# Patient Record
Sex: Female | Born: 1935 | Race: White | Hispanic: No | Smoking: Never smoker
Health system: Southern US, Community
[De-identification: ages and names within clinical notes are randomized; demographics above are authoritative.]

## PROBLEM LIST (undated history)

## (undated) DIAGNOSIS — E559 Vitamin D deficiency, unspecified: Secondary | ICD-10-CM

## (undated) DIAGNOSIS — I2510 Atherosclerotic heart disease of native coronary artery without angina pectoris: Secondary | ICD-10-CM

## (undated) DIAGNOSIS — K5792 Diverticulitis of intestine, part unspecified, without perforation or abscess without bleeding: Secondary | ICD-10-CM

## (undated) DIAGNOSIS — I4891 Unspecified atrial fibrillation: Secondary | ICD-10-CM

## (undated) DIAGNOSIS — K219 Gastro-esophageal reflux disease without esophagitis: Secondary | ICD-10-CM

## (undated) DIAGNOSIS — I519 Heart disease, unspecified: Secondary | ICD-10-CM

## (undated) DIAGNOSIS — E785 Hyperlipidemia, unspecified: Secondary | ICD-10-CM

## (undated) DIAGNOSIS — E039 Hypothyroidism, unspecified: Secondary | ICD-10-CM

## (undated) DIAGNOSIS — I10 Essential (primary) hypertension: Secondary | ICD-10-CM

## (undated) HISTORY — DX: Vitamin D deficiency, unspecified: E55.9

## (undated) HISTORY — PX: WRIST FRACTURE SURGERY: SHX121

## (undated) HISTORY — PX: ANKLE FRACTURE SURGERY: SHX122

## (undated) HISTORY — PX: FEMUR FRACTURE SURGERY: SHX633

## (undated) HISTORY — DX: Heart disease, unspecified: I51.9

## (undated) HISTORY — DX: Unspecified atrial fibrillation: I48.91

## (undated) HISTORY — PX: CORONARY STENT PLACEMENT: SHX1402

## (undated) HISTORY — PX: CHOLECYSTECTOMY: SHX55

---

## 2020-11-16 ENCOUNTER — Observation Stay (HOSPITAL_COMMUNITY)
Admission: EM | Admit: 2020-11-16 | Discharge: 2020-11-17 | Disposition: A | Discharge status: Home / Self Care | Payer: Medicare HMO | Attending: Internal Medicine | Admitting: Internal Medicine

## 2020-11-16 ENCOUNTER — Encounter (HOSPITAL_COMMUNITY): Payer: Self-pay | Admitting: Emergency Medicine

## 2020-11-16 ENCOUNTER — Emergency Department (HOSPITAL_COMMUNITY): Payer: Medicare HMO

## 2020-11-16 ENCOUNTER — Other Ambulatory Visit: Payer: Self-pay

## 2020-11-16 DIAGNOSIS — Z20822 Contact with and (suspected) exposure to covid-19: Secondary | ICD-10-CM | POA: Insufficient documentation

## 2020-11-16 DIAGNOSIS — R197 Diarrhea, unspecified: Secondary | ICD-10-CM

## 2020-11-16 DIAGNOSIS — K529 Noninfective gastroenteritis and colitis, unspecified: Secondary | ICD-10-CM | POA: Diagnosis not present

## 2020-11-16 DIAGNOSIS — I2510 Atherosclerotic heart disease of native coronary artery without angina pectoris: Secondary | ICD-10-CM | POA: Diagnosis not present

## 2020-11-16 DIAGNOSIS — I10 Essential (primary) hypertension: Secondary | ICD-10-CM | POA: Insufficient documentation

## 2020-11-16 DIAGNOSIS — R112 Nausea with vomiting, unspecified: Secondary | ICD-10-CM | POA: Diagnosis present

## 2020-11-16 DIAGNOSIS — E039 Hypothyroidism, unspecified: Secondary | ICD-10-CM | POA: Insufficient documentation

## 2020-11-16 DIAGNOSIS — I4891 Unspecified atrial fibrillation: Secondary | ICD-10-CM | POA: Diagnosis not present

## 2020-11-16 DIAGNOSIS — I4819 Other persistent atrial fibrillation: Secondary | ICD-10-CM | POA: Diagnosis present

## 2020-11-16 DIAGNOSIS — I482 Chronic atrial fibrillation, unspecified: Secondary | ICD-10-CM | POA: Diagnosis not present

## 2020-11-16 HISTORY — DX: Atherosclerotic heart disease of native coronary artery without angina pectoris: I25.10

## 2020-11-16 HISTORY — DX: Gastro-esophageal reflux disease without esophagitis: K21.9

## 2020-11-16 HISTORY — DX: Diverticulitis of intestine, part unspecified, without perforation or abscess without bleeding: K57.92

## 2020-11-16 HISTORY — DX: Hypothyroidism, unspecified: E03.9

## 2020-11-16 HISTORY — DX: Essential (primary) hypertension: I10

## 2020-11-16 HISTORY — DX: Hyperlipidemia, unspecified: E78.5

## 2020-11-16 LAB — COMPREHENSIVE METABOLIC PANEL
ALT: 15 U/L (ref 0–44)
AST: 28 U/L (ref 15–41)
Albumin: 3.9 g/dL (ref 3.5–5.0)
Alkaline Phosphatase: 84 U/L (ref 38–126)
Anion gap: 12 (ref 5–15)
BUN: 12 mg/dL (ref 8–23)
CO2: 25 mmol/L (ref 22–32)
Calcium: 9.7 mg/dL (ref 8.9–10.3)
Chloride: 106 mmol/L (ref 98–111)
Creatinine, Ser: 0.64 mg/dL (ref 0.44–1.00)
GFR, Estimated: >60 mL/min (ref >60)
Glucose, Bld: 128 mg/dL — ABNORMAL HIGH (ref 70–99)
Potassium: 4.2 mmol/L (ref 3.5–5.1)
Sodium: 143 mmol/L (ref 135–145)
Total Bilirubin: 1.6 mg/dL — ABNORMAL HIGH (ref 0.3–1.2)
Total Protein: 7 g/dL (ref 6.5–8.1)

## 2020-11-16 LAB — URINALYSIS, ROUTINE W REFLEX MICROSCOPIC
Bacteria, UA: NONE SEEN
Bilirubin Urine: NEGATIVE
Glucose, UA: NEGATIVE mg/dL
Hgb urine dipstick: NEGATIVE
Ketones, ur: 5 mg/dL — AB
Nitrite: NEGATIVE
Protein, ur: NEGATIVE mg/dL
Specific Gravity, Urine: 1.025 (ref 1.005–1.030)
pH: 9 — ABNORMAL HIGH (ref 5.0–8.0)

## 2020-11-16 LAB — CBC WITH DIFFERENTIAL/PLATELET
Abs Immature Granulocytes: 0.02 10*3/uL (ref 0.00–0.07)
Basophils Absolute: 0 10*3/uL (ref 0.0–0.1)
Basophils Relative: 0 %
Eosinophils Absolute: 0 10*3/uL (ref 0.0–0.5)
Eosinophils Relative: 0 %
HCT: 41.2 % (ref 36.0–46.0)
Hemoglobin: 14 g/dL (ref 12.0–15.0)
Immature Granulocytes: 0 %
Lymphocytes Relative: 9 %
Lymphs Abs: 0.8 10*3/uL (ref 0.7–4.0)
MCH: 33.6 pg (ref 26.0–34.0)
MCHC: 34 g/dL (ref 30.0–36.0)
MCV: 98.8 fL (ref 80.0–100.0)
Monocytes Absolute: 0.4 10*3/uL (ref 0.1–1.0)
Monocytes Relative: 5 %
Neutro Abs: 7.7 10*3/uL (ref 1.7–7.7)
Neutrophils Relative %: 86 %
Platelets: 196 10*3/uL (ref 150–400)
RBC: 4.17 MIL/uL (ref 3.87–5.11)
RDW: 13.1 % (ref 11.5–15.5)
WBC: 9 10*3/uL (ref 4.0–10.5)
nRBC: 0 % (ref 0.0–0.2)

## 2020-11-16 LAB — RESP PANEL BY RT-PCR (FLU A&B, COVID) ARPGX2
Influenza A by PCR: NEGATIVE
Influenza B by PCR: NEGATIVE
SARS Coronavirus 2 by RT PCR: NEGATIVE

## 2020-11-16 LAB — LIPASE, BLOOD: Lipase: 33 U/L (ref 11–51)

## 2020-11-16 MED ORDER — ONDANSETRON HCL 4 MG/2ML IJ SOLN
4.0000 mg | Freq: Once | INTRAMUSCULAR | Status: AC
  Administered 2020-11-16: 4 mg via INTRAVENOUS
  Filled 2020-11-16: qty 2

## 2020-11-16 MED ORDER — SODIUM CHLORIDE 0.9 % IV BOLUS
500.0000 mL | Freq: Once | INTRAVENOUS | Status: AC
  Administered 2020-11-16: 500 mL via INTRAVENOUS

## 2020-11-16 MED ORDER — ACETAMINOPHEN 650 MG RE SUPP: 650.0000 mg | Freq: Four times a day (QID) | RECTAL | Status: DC | PRN

## 2020-11-16 MED ORDER — SODIUM CHLORIDE 0.9 % IV SOLN: INTRAVENOUS | Status: DC

## 2020-11-16 MED ORDER — ONDANSETRON HCL 4 MG PO TABS: 4.0000 mg | ORAL_TABLET | Freq: Four times a day (QID) | ORAL | Status: DC | PRN

## 2020-11-16 MED ORDER — FENTANYL CITRATE PF 50 MCG/ML IJ SOSY
25.0000 ug | PREFILLED_SYRINGE | Freq: Once | INTRAMUSCULAR | Status: AC
  Administered 2020-11-16: 25 ug via INTRAVENOUS
  Filled 2020-11-16: qty 1

## 2020-11-16 MED ORDER — ACETAMINOPHEN 325 MG PO TABS
650.0000 mg | ORAL_TABLET | Freq: Four times a day (QID) | ORAL | Status: DC | PRN
  Administered 2020-11-17: 650 mg via ORAL
  Filled 2020-11-16: qty 2

## 2020-11-16 MED ORDER — LACTATED RINGERS IV SOLN: INTRAVENOUS | Status: DC

## 2020-11-16 MED ORDER — IOHEXOL 350 MG/ML SOLN
80.0000 mL | Freq: Once | INTRAVENOUS | Status: AC | PRN
  Administered 2020-11-16: 80 mL via INTRAVENOUS

## 2020-11-16 MED ORDER — ONDANSETRON HCL 4 MG/2ML IJ SOLN: 4.0000 mg | Freq: Four times a day (QID) | INTRAMUSCULAR | Status: DC | PRN

## 2020-11-16 NOTE — ED Provider Notes (Signed)
[facility]-EMERGENCY DEPT Provider Note   CSN: [id number] Arrival date & time: 11/16/20  1752     History Chief Complaint  Patient presents with   Nausea   Emesis   Diarrhea    Charlotte Powell is a 87 y.o. female.  HPI Patient presents for evaluation of nausea, vomiting and diarrhea all of which started today.  She is living in a nursing care facility.  Patient denies blood in emesis or stool.  She does not feel like she has had a fever.  She complains of pain in her right lower abdomen, her upper back and her neck.  She has chronic neck pain for which she uses "a cream."  Her daughter is here and helps to give history.  No other recent illnesses.  No chronic problems with gastrointestinal disorders.  There are no other known active modifying factors    History reviewed. No pertinent past medical history.  There are no problems to display for this patient.    OB History   No obstetric history on file.     No family history on file.     Home Medications Prior to Admission medications   Not on File    Allergies    Patient has no allergy information on record.  Review of Systems   Review of Systems  All other systems reviewed and are negative.  Physical Exam Updated Vital Signs BP (!) 127/105   Pulse (!) 113   Temp 97.7 F (36.5 C) (Oral)   Resp 18   Ht 5\' 3"  (1.6 m)   Wt 52.6 kg   SpO2 92%   BMI 20.55 kg/m   Physical Exam Vitals and nursing note reviewed.  Constitutional:      General: She is not in acute distress.    Appearance: She is well-developed. She is not ill-appearing, toxic-appearing or diaphoretic.     Comments: Elderly, frail  HENT:     Head: Normocephalic and atraumatic.  Eyes:     Conjunctiva/sclera: Conjunctivae normal.     Pupils: Pupils are equal, round, and reactive to light.  Neck:     Trachea: Phonation normal.  Cardiovascular:     Rate and Rhythm: Normal rate and regular rhythm.  Pulmonary:      Effort: Pulmonary effort is normal.     Breath sounds: Normal breath sounds.  Chest:     Chest wall: No tenderness.  Abdominal:     General: There is no distension.     Palpations: Abdomen is soft.     Tenderness: There is abdominal tenderness (Right lower quadrant, mild). There is no guarding.  Musculoskeletal:        General: No swelling or tenderness. Normal range of motion.     Cervical back: Normal range of motion and neck supple.  Skin:    General: Skin is warm and dry.     Coloration: Skin is not jaundiced or pale.  Neurological:     Mental Status: She is alert and oriented to person, place, and time.     Motor: No abnormal muscle tone.  Psychiatric:        Mood and Affect: Mood normal.        Behavior: Behavior normal.        Thought Content: Thought content normal.        Judgment: Judgment normal.    ED Results / Procedures / Treatments   Labs (all labs ordered are listed, but only abnormal results are displayed)  Labs Reviewed  COMPREHENSIVE METABOLIC PANEL - Abnormal; Notable for the following components:      Result Value   Glucose, Bld 128 (*)    Total Bilirubin 1.6 (*)    All other components within normal limits  URINALYSIS, ROUTINE W REFLEX MICROSCOPIC - Abnormal; Notable for the following components:   APPearance CLOUDY (*)    pH 9.0 (*)    Ketones, ur 5 (*)    Leukocytes,Ua TRACE (*)    All other components within normal limits  RESP PANEL BY RT-PCR (FLU A&B, COVID) ARPGX2  LIPASE, BLOOD  CBC WITH DIFFERENTIAL/PLATELET    EKG None  Radiology CT Abdomen Pelvis W Contrast  Result Date: 11/16/2020 CLINICAL DATA:  Nausea vomiting and diarrhea EXAM: CT ABDOMEN AND PELVIS WITH CONTRAST TECHNIQUE: Multidetector CT imaging of the abdomen and pelvis was performed using the standard protocol following bolus administration of intravenous contrast. CONTRAST:  80mL OMNIPAQUE IOHEXOL 350 MG/ML SOLN COMPARISON:  None. FINDINGS: LOWER CHEST: Normal. HEPATOBILIARY:  Normal hepatic contours. No intra- or extrahepatic biliary dilatation. Status post cholecystectomy. PANCREAS: Normal pancreas. No ductal dilatation or peripancreatic fluid collection. SPLEEN: Normal. ADRENALS/URINARY TRACT: The adrenal glands are normal. No hydronephrosis, nephroureterolithiasis or solid renal mass. The urinary bladder is normal for degree of distention STOMACH/BOWEL: There is no hiatal hernia. Normal duodenal course and caliber. No small bowel dilatation or inflammation. No focal colonic abnormality. Normal appendix. VASCULAR/LYMPHATIC: There is calcific atherosclerosis of the abdominal aorta. No lymphadenopathy. REPRODUCTIVE: Normal uterus. No adnexal mass. MUSCULOSKELETAL. Multilevel degenerative disc disease and facet arthrosis. No bony spinal canal stenosis. OTHER: None. IMPRESSION: 1. No acute abnormality of the abdomen or pelvis. Aortic Atherosclerosis (ICD10-I70.0). Electronically Signed   By: Deatra Robinson M.D.   On: 11/16/2020 21:24    Procedures Procedures   Medications Ordered in ED Medications  0.9 %  sodium chloride infusion ( Intravenous New Bag/Given 11/16/20 2037)  fentaNYL (SUBLIMAZE) injection 25 mcg (25 mcg Intravenous Given 11/16/20 1928)  ondansetron (ZOFRAN) injection 4 mg (4 mg Intravenous Given 11/16/20 1927)  sodium chloride 0.9 % bolus 500 mL (500 mLs Intravenous New Bag/Given 11/16/20 1932)  iohexol (OMNIPAQUE) 350 MG/ML injection 80 mL (80 mLs Intravenous Contrast Given 11/16/20 2104)    ED Course  I have reviewed the triage vital signs and the nursing notes.  Pertinent labs & imaging results that were available during my care of the patient were reviewed by me and considered in my medical decision making (see chart for details).    MDM Rules/Calculators/A&P                            Patient Vitals for the past 24 hrs:  BP Temp Temp src Pulse Resp SpO2 Height Weight  11/16/20 2230 (!) 127/105 -- -- (!) 113 18 92 % -- --  11/16/20 2138 (!) 151/65  -- -- 95 18 99 % -- --  11/16/20 2030 (!) 147/67 -- -- (!) 102 18 91 % -- --  11/16/20 2015 -- -- -- 96 -- 95 % -- --  11/16/20 2000 (!) 145/68 -- -- 92 18 93 % -- --  11/16/20 1945 -- -- -- 86 -- 91 % -- --  11/16/20 1930 (!) 150/64 -- -- 85 -- 100 % -- --  11/16/20 1915 -- -- -- 90 -- 100 % -- --  11/16/20 1900 (!) 150/82 -- -- 86 18 100 % -- --  11/16/20 1845 -- -- -- 89 --  94 % -- --  11/16/20 1830 134/84 -- -- 92 -- 100 % -- --  11/16/20 1815 -- -- -- 78 -- 100 % -- --  11/16/20 1806 -- -- -- -- -- -- 5\' 3"  (1.6 m) 52.6 kg  11/16/20 1801 126/89 97.7 F (36.5 C) Oral 81 18 100 % -- --  11/16/20 1800 126/89 -- -- 73 -- 100 % -- --    10:47 PM Reevaluation with update and discussion. After initial assessment and treatment, an updated evaluation reveals she states that she is feeling better.  At this time her abdominal discomfort is somewhat less but still mildly tender, in the mid abdomen.  She has been able to eat 2 crackers, and drink about 4 ounces of fluid without vomiting.  Patient states she does not feel like she can go home because she is too weak.  Patient's daughter is with her and agrees with that assessment.  We will contact the hospitalist for admission. 11/18/20   Medical Decision Making:  This patient is presenting for evaluation of nausea, vomiting and diarrhea, which does require a range of treatment options, and is a complaint that involves a moderate risk of morbidity and mortality. The differential diagnoses include enteritis, colitis, food poisoning. I decided to review old records, and in summary elderly female lives in a nursing care facility presenting with nausea vomiting diarrhea.  She is generally healthy for her age.  I did not require additional historical information from anyone.  Clinical Laboratory Tests Ordered, included CBC, Metabolic panel, and lipase, viral panel, urinalysis . Review indicates normal except urine with trace leukocytes, glucose  high. Radiologic Tests Ordered, included CT abdomen pelvis.  I independently Visualized: Radiographic images, which show no acute abnormalities   Critical Interventions-clinical evaluation, laboratory testing, medication treatment, IV fluids, observation and reassessment  After These Interventions, the Patient was reevaluated and was found with persistent discomfort after initial treatment.  Laboratory and CT imaging is reassuring.  Patient does not feel well enough to go home.  She is an elderly debilitated patient, lives in a nursing care facility.  No clear cause for vomiting and diarrhea.  Hospitalist contacted to admit patient for observation.  CRITICAL CARE-no Performed by: Mancel Bale  Nursing Notes Reviewed/ Care Coordinated Applicable Imaging Reviewed Interpretation of Laboratory Data incorporated into ED treatment  10:50 PM-Consult complete with hospitalist. Patient case explained and discussed.  He agrees to admit patient for further evaluation and treatment. Call ended at 11:12 PM    Final Clinical Impression(s) / ED Diagnoses Final diagnoses:  Nausea vomiting and diarrhea    Rx / DC Orders ED Discharge Orders     None        Mancel Bale, MD 11/16/20 2316

## 2020-11-16 NOTE — ED Triage Notes (Addendum)
Pt BIBA.  Coming from [facility] with c/o sudden acute onset of n/v/d that started at about 5 pm this afternoon. Hx of A-fib and enterocolitis d/t c-diff.   T- 102 temporal 300 ml NS 4 mg Zofran 22 G right upper arm CBG 139 BP- 138/76 Hr - 80

## 2020-11-16 NOTE — H&P (Addendum)
History and Physical    Charlotte Powell NTI:[id number] DOB: Mar 18, 1928 DOA: 11/16/2020  PCP: System, Provider Not In  Patient coming from: ALF  I have personally briefly reviewed patient's old medical records in [facility]  Chief Complaint: N/V/D  HPI: Charlotte Powell is a 87 y.o. female with medical history significant of HTN, HLD, CAD s/p stent.  Pt presents to ED with c/o sudden onset N/V/D all onset today.  Started with diarrhea which then rapidly resolved but pt developed severe N/V.  RLQ abd pain, upper back and neck pain.  Neck pain is chronic.    No fever, chills.   ED Course: Symptoms improving rapidly.  Now actually tolerating soda PO, just feels weak.  Lab work is unimpressive, as is her CT A/P.  EDP requests overnight obs.   Review of Systems: As per HPI, otherwise all review of systems negative.  Past Medical History:  Diagnosis Date   CAD (coronary artery disease)    Diverticulitis    GERD (gastroesophageal reflux disease)    HLD (hyperlipidemia)    HTN (hypertension)    Hypothyroidism     Past Surgical History:  Procedure Laterality Date   ANKLE FRACTURE SURGERY     CHOLECYSTECTOMY     CORONARY STENT PLACEMENT     FEMUR FRACTURE SURGERY     WRIST FRACTURE SURGERY       reports that she has never smoked. She has never used smokeless tobacco. She reports that she does not drink alcohol and does not use drugs.  Allergies  Allergen Reactions   Amoxicillin    Hydrocodone     Confusion and AMS    Family History  Problem Relation Age of Onset   Stomach cancer Neg Hx      Prior to Admission medications   Not on File    Physical Exam: Vitals:   11/16/20 2015 11/16/20 2030 11/16/20 2138 11/16/20 2230  BP:  (!) 147/67 (!) 151/65 (!) 127/105  Pulse: 96 (!) 102 95 (!) 113  Resp:  18 18 18   Temp:      TempSrc:      SpO2: 95% 91% 99% 92%  Weight:      Height:        Constitutional: NAD, calm, comfortable Eyes: PERRL, lids and  conjunctivae normal ENMT: Mucous membranes are moist. Posterior pharynx clear of any exudate or lesions.Normal dentition.  Neck: normal, supple, no masses, no thyromegaly Respiratory: clear to auscultation bilaterally, no wheezing, no crackles. Normal respiratory effort. No accessory muscle use.  Cardiovascular: Regular rate and rhythm, no murmurs / rubs / gallops. No extremity edema. 2+ pedal pulses. No carotid bruits.  Abdomen: Mild RLQ TTP Musculoskeletal: no clubbing / cyanosis. No joint deformity upper and lower extremities. Good ROM, no contractures. Normal muscle tone.  Skin: no rashes, lesions, ulcers. No induration Neurologic: CN 2-12 grossly intact. Sensation intact, DTR normal. Strength 5/5 in all 4.  Psychiatric: Normal judgment and insight. Alert and oriented x 3. Normal mood.    Labs on Admission: I have personally reviewed following labs and imaging studies  CBC: Recent Labs  Lab 11/16/20 1931  WBC 9.0  NEUTROABS 7.7  HGB 14.0  HCT 41.2  MCV 98.8  PLT 196   Basic Metabolic Panel: Recent Labs  Lab 11/16/20 1931  NA 143  K 4.2  CL 106  CO2 25  GLUCOSE 128*  BUN 12  CREATININE 0.64  CALCIUM 9.7   GFR: Estimated Creatinine Clearance: 38.7 mL/min (by C-G  formula based on SCr of 0.64 mg/dL). Liver Function Tests: Recent Labs  Lab 11/16/20 1931  AST 28  ALT 15  ALKPHOS 84  BILITOT 1.6*  PROT 7.0  ALBUMIN 3.9   Recent Labs  Lab 11/16/20 1931  LIPASE 33   No results for input(s): AMMONIA in the last 168 hours. Coagulation Profile: No results for input(s): INR, PROTIME in the last 168 hours. Cardiac Enzymes: No results for input(s): CKTOTAL, CKMB, CKMBINDEX, TROPONINI in the last 168 hours. BNP (last 3 results) No results for input(s): PROBNP in the last 8760 hours. HbA1C: No results for input(s): HGBA1C in the last 72 hours. CBG: No results for input(s): GLUCAP in the last 168 hours. Lipid Profile: No results for input(s): CHOL, HDL, LDLCALC,  TRIG, CHOLHDL, LDLDIRECT in the last 72 hours. Thyroid Function Tests: No results for input(s): TSH, T4TOTAL, FREET4, T3FREE, THYROIDAB in the last 72 hours. Anemia Panel: No results for input(s): VITAMINB12, FOLATE, FERRITIN, TIBC, IRON, RETICCTPCT in the last 72 hours. Urine analysis:    Component Value Date/Time   COLORURINE YELLOW 11/16/2020 2129   APPEARANCEUR CLOUDY (A) 11/16/2020 2129   LABSPEC 1.025 11/16/2020 2129   PHURINE 9.0 (H) 11/16/2020 2129   GLUCOSEU NEGATIVE 11/16/2020 2129   HGBUR NEGATIVE 11/16/2020 2129   BILIRUBINUR NEGATIVE 11/16/2020 2129   KETONESUR 5 (A) 11/16/2020 2129   PROTEINUR NEGATIVE 11/16/2020 2129   NITRITE NEGATIVE 11/16/2020 2129   LEUKOCYTESUR TRACE (A) 11/16/2020 2129    Radiological Exams on Admission: CT Abdomen Pelvis W Contrast  Result Date: 11/16/2020 CLINICAL DATA:  Nausea vomiting and diarrhea EXAM: CT ABDOMEN AND PELVIS WITH CONTRAST TECHNIQUE: Multidetector CT imaging of the abdomen and pelvis was performed using the standard protocol following bolus administration of intravenous contrast. CONTRAST:  23mL OMNIPAQUE IOHEXOL 350 MG/ML SOLN COMPARISON:  None. FINDINGS: LOWER CHEST: Normal. HEPATOBILIARY: Normal hepatic contours. No intra- or extrahepatic biliary dilatation. Status post cholecystectomy. PANCREAS: Normal pancreas. No ductal dilatation or peripancreatic fluid collection. SPLEEN: Normal. ADRENALS/URINARY TRACT: The adrenal glands are normal. No hydronephrosis, nephroureterolithiasis or solid renal mass. The urinary bladder is normal for degree of distention STOMACH/BOWEL: There is no hiatal hernia. Normal duodenal course and caliber. No small bowel dilatation or inflammation. No focal colonic abnormality. Normal appendix. VASCULAR/LYMPHATIC: There is calcific atherosclerosis of the abdominal aorta. No lymphadenopathy. REPRODUCTIVE: Normal uterus. No adnexal mass. MUSCULOSKELETAL. Multilevel degenerative disc disease and facet  arthrosis. No bony spinal canal stenosis. OTHER: None. IMPRESSION: 1. No acute abnormality of the abdomen or pelvis. Aortic Atherosclerosis (ICD10-I70.0). Electronically Signed   By: Deatra Robinson M.D.   On: 11/16/2020 21:24    EKG: Independently reviewed.  Assessment/Plan Principal Problem:   Nausea & vomiting Active Problems:   Gastroenteritis   A-fib (HCC)    N/V - Suspect gastroenteritis, maybe food poisoning? Tolerating liquids PO at this time Clear liquid diet Overnight OBS IVF: LR at 100 for tonight Repeat CBC/BMP in AM Given improvement in symptoms already as well as neg CT findings: Holding off on empiric ABx for the moment Will also hold off on GI pathogen pnl, etc, unless symptoms dont continue to improve overnight Zofran PRN A.Fib -  Cont eliquis Doesn't appear to be on any rate/rhythm control meds Reportedly went in to S.[place] in ED Tele monitor  DVT prophylaxis: Eliquis Code Status: DNR Family Communication: Daughter at bedside Disposition Plan: back to ALF, hopefully tomorrow if she continues to improve Consults called: None Admission status: Place in [place],  Khayri Kargbo M. DO [facility]  How to contact the [facility] Attending or Consulting provider 7A - 7P or covering provider during after hours 7P -7A, for this patient?  Check the care team in [facility] and look for a) attending/consulting [facility] provider listed and b) the [facility] team listed Log into [email or web address]  Am[employer] Physician Scheduling and messaging for groups and whole hospitals  On call and physician scheduling software for group practices, residents, hospitalists and other medical providers for call, clinic, rotation and shift schedules. On[employer] is a hospital-wide system for scheduling doctors and paging doctors on call. EasyPlot is for scientific plotting and data analysis.  [email or web address]  and use [place]'s universal password to access. If you do not have the password, please contact  the hospital operator.  Locate the [facility] provider you are looking for under [facility] and page to a number that you can be directly reached. If you still have difficulty reaching the provider, please page the [facility] (Director on Call) for the Hospitalists listed on amion for assistance.  11/16/2020, 11:52 PM

## 2020-11-17 DIAGNOSIS — K529 Noninfective gastroenteritis and colitis, unspecified: Secondary | ICD-10-CM | POA: Diagnosis not present

## 2020-11-17 DIAGNOSIS — R112 Nausea with vomiting, unspecified: Secondary | ICD-10-CM | POA: Diagnosis not present

## 2020-11-17 LAB — COMPREHENSIVE METABOLIC PANEL
ALT: 14 U/L (ref 0–44)
AST: 22 U/L (ref 15–41)
Albumin: 3.4 g/dL — ABNORMAL LOW (ref 3.5–5.0)
Alkaline Phosphatase: 68 U/L (ref 38–126)
Anion gap: 6 (ref 5–15)
BUN: 8 mg/dL (ref 8–23)
CO2: 26 mmol/L (ref 22–32)
Calcium: 8.9 mg/dL (ref 8.9–10.3)
Chloride: 106 mmol/L (ref 98–111)
Creatinine, Ser: 0.57 mg/dL (ref 0.44–1.00)
GFR, Estimated: >60 mL/min (ref >60)
Glucose, Bld: 88 mg/dL (ref 70–99)
Potassium: 3.5 mmol/L (ref 3.5–5.1)
Sodium: 138 mmol/L (ref 135–145)
Total Bilirubin: 1.7 mg/dL — ABNORMAL HIGH (ref 0.3–1.2)
Total Protein: 6.1 g/dL — ABNORMAL LOW (ref 6.5–8.1)

## 2020-11-17 LAB — CBC
HCT: 37.6 % (ref 36.0–46.0)
Hemoglobin: 12.6 g/dL (ref 12.0–15.0)
MCH: 34.1 pg — ABNORMAL HIGH (ref 26.0–34.0)
MCHC: 33.5 g/dL (ref 30.0–36.0)
MCV: 101.9 fL — ABNORMAL HIGH (ref 80.0–100.0)
Platelets: 160 10*3/uL (ref 150–400)
RBC: 3.69 MIL/uL — ABNORMAL LOW (ref 3.87–5.11)
RDW: 13.4 % (ref 11.5–15.5)
WBC: 5.8 10*3/uL (ref 4.0–10.5)
nRBC: 0 % (ref 0.0–0.2)

## 2020-11-17 MED ORDER — GABAPENTIN 100 MG PO CAPS
100.0000 mg | ORAL_CAPSULE | Freq: Every day | ORAL | Status: DC
  Administered 2020-11-17: 100 mg via ORAL
  Filled 2020-11-17: qty 1

## 2020-11-17 MED ORDER — ACETAMINOPHEN 500 MG PO TABS: 1000.0000 mg | ORAL_TABLET | Freq: Every day | ORAL | Status: DC

## 2020-11-17 MED ORDER — FLUTICASONE PROPIONATE 50 MCG/ACT NA SUSP
1.0000 | Freq: Two times a day (BID) | NASAL | Status: DC
  Administered 2020-11-17: 1 via NASAL
  Filled 2020-11-17: qty 16

## 2020-11-17 MED ORDER — DOCUSATE SODIUM 100 MG PO CAPS: 100.0000 mg | ORAL_CAPSULE | Freq: Every day | ORAL | Status: DC

## 2020-11-17 MED ORDER — PRAVASTATIN SODIUM 20 MG PO TABS
80.0000 mg | ORAL_TABLET | Freq: Every day | ORAL | Status: DC
  Administered 2020-11-17: 80 mg via ORAL
  Filled 2020-11-17: qty 4

## 2020-11-17 MED ORDER — FOLIC ACID 1 MG PO TABS: 1.0000 mg | ORAL_TABLET | Freq: Every day | ORAL | Status: DC

## 2020-11-17 MED ORDER — APIXABAN 5 MG PO TABS
5.0000 mg | ORAL_TABLET | Freq: Two times a day (BID) | ORAL | Status: DC
  Administered 2020-11-17: 5 mg via ORAL
  Filled 2020-11-17 (×2): qty 1

## 2020-11-17 MED ORDER — LEVOTHYROXINE SODIUM 25 MCG PO TABS
25.0000 ug | ORAL_TABLET | Freq: Every day | ORAL | Status: DC
  Administered 2020-11-17: 25 ug via ORAL
  Filled 2020-11-17: qty 1

## 2020-11-17 MED ORDER — METOPROLOL TARTRATE 5 MG/5ML IV SOLN: 5.0000 mg | INTRAVENOUS | Status: DC | PRN

## 2020-11-17 MED ORDER — VITAMIN D3 25 MCG (1000 UNIT) PO TABS
1000.0000 [IU] | ORAL_TABLET | Freq: Every day | ORAL | Status: DC
  Filled 2020-11-17: qty 1

## 2020-11-17 MED ORDER — DICLOFENAC SODIUM 1 % EX GEL
4.0000 g | Freq: Two times a day (BID) | CUTANEOUS | Status: DC
  Filled 2020-11-17: qty 100

## 2020-11-17 MED ORDER — FAMOTIDINE 20 MG PO TABS: 20.0000 mg | ORAL_TABLET | Freq: Every day | ORAL | Status: DC

## 2020-11-17 NOTE — Discharge Summary (Signed)
Physician Discharge Summary   Patient name: Charlotte Powell  Admit date:     11/16/2020  Discharge date: 11/17/2020  Attending Physician: Hillary Bow [[id number]]  Discharge Physician: Lewie Chamber   PCP: System, Provider Not In    Recommendations at discharge: Continue routine outpatient care  Discharge Diagnoses Principal Problem:   Nausea & vomiting Active Problems:   Gastroenteritis   A-fib [facility])   Resolved Diagnoses Resolved Problems:   * No resolved hospital problems. [facility] Course    Charlotte Powell is a 87 yo female with PMH HTN, HLD, CAD who presented with N/V and some diarrhea. She lives in assisted living.  There were no other known sick contacts prior to hospitalization.  She had right lower quadrant abdominal pain as well as complaints of back pain on admission.  She has known chronic neck pain. She underwent CT abdomen/pelvis on admission which was negative for acute abnormalities to explain her pain. Lab work-up was also reassuring with no significant abnormalities.  She was started on fluids and monitored overnight.  She had improvement in her symptoms and was able to tolerate a diet the morning following admission and was considered stable for discharging.    Condition at discharge: stable  Exam General appearance: alert, cooperative, and no distress Head: Normocephalic, without obvious abnormality, atraumatic Eyes:  EOMI Lungs: clear to auscultation bilaterally Heart: regular rate and rhythm and S1, S2 normal Abdomen:  soft, NT, ND, BS present Extremities:  no edema Skin: mobility and turgor normal Neurologic: Grossly normal   Disposition: Home  Discharge time: greater than 30 minutes. Allergies as of 11/17/2020       Reactions   Amoxicillin    Hydrocodone    Confusion and AMS        Medication List     TAKE these medications    acetaminophen 500 MG tablet Commonly known as: TYLENOL Take 1,000 mg by mouth at bedtime.    acetaminophen 325 MG tablet Commonly known as: TYLENOL Take 650 mg by mouth every 8 (eight) hours as needed (for pain).   Cholecalciferol 25 MCG (1000 UT) capsule Take 1,000 Units by mouth daily.   diclofenac Sodium 1 % Gel Commonly known as: VOLTAREN Apply 1 application to right side of neck topically two times a day for pain   docusate sodium 100 MG capsule Commonly known as: COLACE Take 100 mg by mouth daily.   Eliquis 5 MG Tabs tablet Generic drug: apixaban Take 5 mg by mouth 2 (two) times daily.   famotidine 20 MG tablet Commonly known as: PEPCID Take 20 mg by mouth daily.   fluticasone 50 MCG/ACT nasal spray Commonly known as: FLONASE Place 1 spray into both nostrils every 12 (twelve) hours.   folic acid 800 MCG tablet Commonly known as: FOLVITE Take 800 mcg by mouth daily.   gabapentin 100 MG capsule Commonly known as: NEURONTIN Take 100 mg by mouth at bedtime.   levothyroxine 25 MCG tablet Commonly known as: SYNTHROID Take 25 mcg by mouth daily before breakfast.   nitroGLYCERIN 0.4 MG SL tablet Commonly known as: NITROSTAT Place 0.4 mg under the tongue every 5 (five) minutes as needed for chest pain.   pravastatin 80 MG tablet Commonly known as: PRAVACHOL Take 80 mg by mouth at bedtime.        CT Abdomen Pelvis W Contrast  Result Date: 11/16/2020 CLINICAL DATA:  Nausea vomiting and diarrhea EXAM: CT ABDOMEN AND PELVIS WITH CONTRAST TECHNIQUE: Multidetector CT imaging  of the abdomen and pelvis was performed using the standard protocol following bolus administration of intravenous contrast. CONTRAST:  64mL OMNIPAQUE IOHEXOL 350 MG/ML SOLN COMPARISON:  None. FINDINGS: LOWER CHEST: Normal. HEPATOBILIARY: Normal hepatic contours. No intra- or extrahepatic biliary dilatation. Status post cholecystectomy. PANCREAS: Normal pancreas. No ductal dilatation or peripancreatic fluid collection. SPLEEN: Normal. ADRENALS/URINARY TRACT: The adrenal glands are normal. No  hydronephrosis, nephroureterolithiasis or solid renal mass. The urinary bladder is normal for degree of distention STOMACH/BOWEL: There is no hiatal hernia. Normal duodenal course and caliber. No small bowel dilatation or inflammation. No focal colonic abnormality. Normal appendix. VASCULAR/LYMPHATIC: There is calcific atherosclerosis of the abdominal aorta. No lymphadenopathy. REPRODUCTIVE: Normal uterus. No adnexal mass. MUSCULOSKELETAL. Multilevel degenerative disc disease and facet arthrosis. No bony spinal canal stenosis. OTHER: None. IMPRESSION: 1. No acute abnormality of the abdomen or pelvis. Aortic Atherosclerosis (ICD10-I70.0). Electronically Signed   By: Deatra Robinson M.D.   On: 11/16/2020 21:24   Results for orders placed or performed during the hospital encounter of 11/16/20  Resp Panel by RT-PCR (Flu A&B, Covid) Nasopharyngeal Swab     Status: None   Collection Time: 11/16/20  7:34 PM   Specimen: Nasopharyngeal Swab; Nasopharyngeal(NP) swabs in vial transport medium  Result Value Ref Range Status   SARS Coronavirus 2 by RT PCR NEGATIVE NEGATIVE Final    Comment: (NOTE) SARS-CoV-2 target nucleic acids are NOT DETECTED.  The SARS-CoV-2 RNA is generally detectable in upper respiratory specimens during the acute phase of infection. The lowest concentration of SARS-CoV-2 viral copies this assay can detect is 138 copies/mL. A negative result does not preclude SARS-Cov-2 infection and should not be used as the sole basis for treatment or other patient management decisions. A negative result may occur with  improper specimen collection/handling, submission of specimen other than nasopharyngeal swab, presence of viral mutation(s) within the areas targeted by this assay, and inadequate number of viral copies(<138 copies/mL). A negative result must be combined with clinical observations, patient history, and epidemiological information. The expected result is Negative.  Fact Sheet for  Patients:  BloggerCourse.com  Fact Sheet for Healthcare Providers:  SeriousBroker.it  This test is no t yet approved or cleared by the [place] FDA and  has been authorized for detection and/or diagnosis of SARS-CoV-2 by FDA under an Emergency Use Authorization (EUA). This EUA will remain  in effect (meaning this test can be used) for the duration of the COVID-19 declaration under Section 564(b)(1) of the Act, 21 U.S.C.section 360bbb-3(b)(1), unless the authorization is terminated  or revoked sooner.       Influenza A by PCR NEGATIVE NEGATIVE Final   Influenza B by PCR NEGATIVE NEGATIVE Final    Comment: (NOTE) The Xpert Xpress SARS-CoV-2/FLU/RSV plus assay is intended as an aid in the diagnosis of influenza from Nasopharyngeal swab specimens and should not be used as a sole basis for treatment. Nasal washings and aspirates are unacceptable for Xpert Xpress SARS-CoV-2/FLU/RSV testing.  Fact Sheet for Patients: BloggerCourse.com  Fact Sheet for Healthcare Providers: SeriousBroker.it  This test is not yet approved or cleared by the [place] FDA and has been authorized for detection and/or diagnosis of SARS-CoV-2 by FDA under an Emergency Use Authorization (EUA). This EUA will remain in effect (meaning this test can be used) for the duration of the COVID-19 declaration under Section 564(b)(1) of the Act, 21 U.S.C. section 360bbb-3(b)(1), unless the authorization is terminated or revoked.  Performed at [facility], [address] [place],  [place] [address]     Signed:  Lewie Chamber MD.  [facility] 11/17/2020, 1:41 PM

## 2020-11-17 NOTE — ED Notes (Signed)
Patient given coffee, and a sandwich to see how they tolerate it.

## 2020-11-17 NOTE — ED Notes (Signed)
Tolerated food and drink well. Message sent to MD.

## 2020-11-17 NOTE — ED Notes (Signed)
Girguis, MD at bedside to discuss plan with patient. Girguis was informed that patient wanted to be discharged.

## 2020-11-27 ENCOUNTER — Emergency Department (HOSPITAL_COMMUNITY): Payer: Medicare HMO

## 2020-11-27 ENCOUNTER — Encounter (HOSPITAL_COMMUNITY): Payer: Self-pay

## 2020-11-27 ENCOUNTER — Inpatient Hospital Stay (HOSPITAL_COMMUNITY)
Admission: EM | Admit: 2020-11-27 | Discharge: 2020-11-30 | DRG: 177 | Disposition: A | Discharge status: Home Health | Payer: Medicare HMO | Source: Skilled Nursing Facility | Attending: Internal Medicine | Admitting: Internal Medicine

## 2020-11-27 ENCOUNTER — Other Ambulatory Visit: Payer: Self-pay

## 2020-11-27 DIAGNOSIS — Z955 Presence of coronary angioplasty implant and graft: Secondary | ICD-10-CM

## 2020-11-27 DIAGNOSIS — Z79890 Hormone replacement therapy (postmenopausal): Secondary | ICD-10-CM

## 2020-11-27 DIAGNOSIS — G9340 Encephalopathy, unspecified: Secondary | ICD-10-CM

## 2020-11-27 DIAGNOSIS — J189 Pneumonia, unspecified organism: Secondary | ICD-10-CM | POA: Diagnosis present

## 2020-11-27 DIAGNOSIS — J690 Pneumonitis due to inhalation of food and vomit: Principal | ICD-10-CM | POA: Diagnosis present

## 2020-11-27 DIAGNOSIS — E785 Hyperlipidemia, unspecified: Secondary | ICD-10-CM | POA: Diagnosis present

## 2020-11-27 DIAGNOSIS — Z880 Allergy status to penicillin: Secondary | ICD-10-CM

## 2020-11-27 DIAGNOSIS — Z20822 Contact with and (suspected) exposure to covid-19: Secondary | ICD-10-CM | POA: Diagnosis present

## 2020-11-27 DIAGNOSIS — G9341 Metabolic encephalopathy: Secondary | ICD-10-CM | POA: Diagnosis present

## 2020-11-27 DIAGNOSIS — I2510 Atherosclerotic heart disease of native coronary artery without angina pectoris: Secondary | ICD-10-CM | POA: Diagnosis present

## 2020-11-27 DIAGNOSIS — I482 Chronic atrial fibrillation, unspecified: Secondary | ICD-10-CM | POA: Diagnosis not present

## 2020-11-27 DIAGNOSIS — K219 Gastro-esophageal reflux disease without esophagitis: Secondary | ICD-10-CM | POA: Diagnosis present

## 2020-11-27 DIAGNOSIS — Z66 Do not resuscitate: Secondary | ICD-10-CM | POA: Diagnosis present

## 2020-11-27 DIAGNOSIS — E039 Hypothyroidism, unspecified: Secondary | ICD-10-CM | POA: Diagnosis present

## 2020-11-27 DIAGNOSIS — Z7901 Long term (current) use of anticoagulants: Secondary | ICD-10-CM

## 2020-11-27 DIAGNOSIS — I480 Paroxysmal atrial fibrillation: Secondary | ICD-10-CM | POA: Diagnosis present

## 2020-11-27 DIAGNOSIS — Z885 Allergy status to narcotic agent status: Secondary | ICD-10-CM

## 2020-11-27 DIAGNOSIS — R54 Age-related physical debility: Secondary | ICD-10-CM | POA: Diagnosis present

## 2020-11-27 DIAGNOSIS — I10 Essential (primary) hypertension: Secondary | ICD-10-CM | POA: Diagnosis present

## 2020-11-27 DIAGNOSIS — Z79899 Other long term (current) drug therapy: Secondary | ICD-10-CM

## 2020-11-27 DIAGNOSIS — R4701 Aphasia: Secondary | ICD-10-CM | POA: Diagnosis present

## 2020-11-27 LAB — I-STAT VENOUS BLOOD GAS, ED
Acid-Base Excess: 2 mmol/L (ref 0.0–2.0)
Bicarbonate: 26.6 mmol/L (ref 20.0–28.0)
Calcium, Ion: 1.18 mmol/L (ref 1.15–1.40)
HCT: 40 % (ref 36.0–46.0)
Hemoglobin: 13.6 g/dL (ref 12.0–15.0)
O2 Saturation: 84 %
Potassium: 3.6 mmol/L (ref 3.5–5.1)
Sodium: 136 mmol/L (ref 135–145)
TCO2: 28 mmol/L (ref 22–32)
pCO2, Ven: 41.3 mmHg — ABNORMAL LOW (ref 44.0–60.0)
pH, Ven: 7.417 (ref 7.250–7.430)
pO2, Ven: 48 mmHg — ABNORMAL HIGH (ref 32.0–45.0)

## 2020-11-27 LAB — RESP PANEL BY RT-PCR (FLU A&B, COVID) ARPGX2
Influenza A by PCR: NEGATIVE
Influenza B by PCR: NEGATIVE
SARS Coronavirus 2 by RT PCR: NEGATIVE

## 2020-11-27 LAB — RAPID URINE DRUG SCREEN, HOSP PERFORMED
Amphetamines: NOT DETECTED
Barbiturates: NOT DETECTED
Benzodiazepines: NOT DETECTED
Cocaine: NOT DETECTED
Opiates: NOT DETECTED
Tetrahydrocannabinol: NOT DETECTED

## 2020-11-27 LAB — COMPREHENSIVE METABOLIC PANEL
ALT: 9 U/L (ref 0–44)
AST: 26 U/L (ref 15–41)
Albumin: 3.7 g/dL (ref 3.5–5.0)
Alkaline Phosphatase: 85 U/L (ref 38–126)
Anion gap: 10 (ref 5–15)
BUN: 8 mg/dL (ref 8–23)
CO2: 22 mmol/L (ref 22–32)
Calcium: 9.7 mg/dL (ref 8.9–10.3)
Chloride: 104 mmol/L (ref 98–111)
Creatinine, Ser: 0.76 mg/dL (ref 0.44–1.00)
GFR, Estimated: >60 mL/min (ref >60)
Glucose, Bld: 99 mg/dL (ref 70–99)
Potassium: 4.7 mmol/L (ref 3.5–5.1)
Sodium: 136 mmol/L (ref 135–145)
Total Bilirubin: 1.7 mg/dL — ABNORMAL HIGH (ref 0.3–1.2)
Total Protein: 6.4 g/dL — ABNORMAL LOW (ref 6.5–8.1)

## 2020-11-27 LAB — URINALYSIS, ROUTINE W REFLEX MICROSCOPIC
Bilirubin Urine: NEGATIVE
Glucose, UA: NEGATIVE mg/dL
Hgb urine dipstick: NEGATIVE
Ketones, ur: NEGATIVE mg/dL
Leukocytes,Ua: NEGATIVE
Nitrite: NEGATIVE
Protein, ur: NEGATIVE mg/dL
Specific Gravity, Urine: 1.018 (ref 1.005–1.030)
pH: 8 (ref 5.0–8.0)

## 2020-11-27 LAB — DIFFERENTIAL
Abs Immature Granulocytes: 0.02 10*3/uL (ref 0.00–0.07)
Basophils Absolute: 0 10*3/uL (ref 0.0–0.1)
Basophils Relative: 1 %
Eosinophils Absolute: 0.3 10*3/uL (ref 0.0–0.5)
Eosinophils Relative: 4 %
Immature Granulocytes: 0 %
Lymphocytes Relative: 58 %
Lymphs Abs: 4.8 10*3/uL — ABNORMAL HIGH (ref 0.7–4.0)
Monocytes Absolute: 0.6 10*3/uL (ref 0.1–1.0)
Monocytes Relative: 7 %
Neutro Abs: 2.4 10*3/uL (ref 1.7–7.7)
Neutrophils Relative %: 30 %

## 2020-11-27 LAB — LACTIC ACID, PLASMA
Lactic Acid, Venous: 2.2 mmol/L (ref 0.5–1.9)
Lactic Acid, Venous: 0.9 mmol/L (ref 0.5–1.9)

## 2020-11-27 LAB — I-STAT CHEM 8, ED
BUN: 8 mg/dL (ref 8–23)
Calcium, Ion: 1.07 mmol/L — ABNORMAL LOW (ref 1.15–1.40)
Chloride: 105 mmol/L (ref 98–111)
Creatinine, Ser: 0.5 mg/dL (ref 0.44–1.00)
Glucose, Bld: 98 mg/dL (ref 70–99)
HCT: 41 % (ref 36.0–46.0)
Hemoglobin: 13.9 g/dL (ref 12.0–15.0)
Potassium: 4.7 mmol/L (ref 3.5–5.1)
Sodium: 137 mmol/L (ref 135–145)
TCO2: 24 mmol/L (ref 22–32)

## 2020-11-27 LAB — CBC
HCT: 43.7 % (ref 36.0–46.0)
Hemoglobin: 14.6 g/dL (ref 12.0–15.0)
MCH: 34.2 pg — ABNORMAL HIGH (ref 26.0–34.0)
MCHC: 33.4 g/dL (ref 30.0–36.0)
MCV: 102.3 fL — ABNORMAL HIGH (ref 80.0–100.0)
Platelets: 181 10*3/uL (ref 150–400)
RBC: 4.27 MIL/uL (ref 3.87–5.11)
RDW: 12.9 % (ref 11.5–15.5)
WBC: 8.2 10*3/uL (ref 4.0–10.5)
nRBC: 0 % (ref 0.0–0.2)

## 2020-11-27 LAB — APTT: aPTT: 35 seconds (ref 24–36)

## 2020-11-27 LAB — TSH: TSH: 4.119 u[IU]/mL (ref 0.350–4.500)

## 2020-11-27 LAB — STREP PNEUMONIAE URINARY ANTIGEN: Strep Pneumo Urinary Antigen: NEGATIVE

## 2020-11-27 LAB — PROTIME-INR
INR: 1.2 (ref 0.8–1.2)
Prothrombin Time: 14.7 seconds (ref 11.4–15.2)

## 2020-11-27 LAB — AMMONIA: Ammonia: 26 umol/L (ref 9–35)

## 2020-11-27 MED ORDER — ACETAMINOPHEN 500 MG PO TABS
1000.0000 mg | ORAL_TABLET | Freq: Every day | ORAL | Status: DC
  Administered 2020-11-27 – 2020-11-29 (×3): 1000 mg via ORAL
  Filled 2020-11-27 (×3): qty 2

## 2020-11-27 MED ORDER — SODIUM CHLORIDE 0.9% FLUSH
10.0000 mL | Freq: Two times a day (BID) | INTRAVENOUS | Status: DC
  Administered 2020-11-28 – 2020-11-29 (×4): 10 mL

## 2020-11-27 MED ORDER — FOLIC ACID 1 MG PO TABS
1000.0000 ug | ORAL_TABLET | Freq: Every day | ORAL | Status: DC
  Administered 2020-11-28 – 2020-11-30 (×3): 1 mg via ORAL
  Filled 2020-11-27 (×4): qty 1

## 2020-11-27 MED ORDER — LACTATED RINGERS IV BOLUS
1000.0000 mL | Freq: Once | INTRAVENOUS | Status: AC
  Administered 2020-11-27: 1000 mL via INTRAVENOUS

## 2020-11-27 MED ORDER — APIXABAN 2.5 MG PO TABS
2.5000 mg | ORAL_TABLET | Freq: Two times a day (BID) | ORAL | Status: DC
  Administered 2020-11-27 – 2020-11-30 (×6): 2.5 mg via ORAL
  Filled 2020-11-27 (×6): qty 1

## 2020-11-27 MED ORDER — ACETAMINOPHEN 325 MG PO TABS: 650.0000 mg | ORAL_TABLET | Freq: Three times a day (TID) | ORAL | Status: DC | PRN

## 2020-11-27 MED ORDER — VITAMIN D 25 MCG (1000 UNIT) PO TABS
1000.0000 [IU] | ORAL_TABLET | Freq: Every day | ORAL | Status: DC
  Administered 2020-11-28 – 2020-11-30 (×3): 1000 [IU] via ORAL
  Filled 2020-11-27 (×4): qty 1

## 2020-11-27 MED ORDER — HALOPERIDOL LACTATE 5 MG/ML IJ SOLN: 1.0000 mg | Freq: Once | INTRAMUSCULAR | Status: DC

## 2020-11-27 MED ORDER — LACTATED RINGERS IV BOLUS
500.0000 mL | Freq: Once | INTRAVENOUS | Status: AC
  Administered 2020-11-27: 500 mL via INTRAVENOUS

## 2020-11-27 MED ORDER — PRAVASTATIN SODIUM 40 MG PO TABS
80.0000 mg | ORAL_TABLET | Freq: Every day | ORAL | Status: DC
  Administered 2020-11-28 – 2020-11-30 (×3): 80 mg via ORAL
  Filled 2020-11-27 (×4): qty 2

## 2020-11-27 MED ORDER — SODIUM CHLORIDE 0.9 % IV SOLN
2.0000 g | INTRAVENOUS | Status: DC
Start: 2020-11-27 — End: 2020-11-30
  Administered 2020-11-28 – 2020-11-29 (×2): 2 g via INTRAVENOUS
  Filled 2020-11-27 (×3): qty 20

## 2020-11-27 MED ORDER — FLUTICASONE PROPIONATE 50 MCG/ACT NA SUSP
1.0000 | Freq: Two times a day (BID) | NASAL | Status: DC | PRN
  Filled 2020-11-27: qty 16

## 2020-11-27 MED ORDER — SODIUM CHLORIDE 0.9 % IV SOLN
500.0000 mg | Freq: Once | INTRAVENOUS | Status: AC
  Administered 2020-11-27: 500 mg via INTRAVENOUS
  Filled 2020-11-27: qty 500

## 2020-11-27 MED ORDER — GABAPENTIN 100 MG PO CAPS
100.0000 mg | ORAL_CAPSULE | Freq: Every day | ORAL | Status: DC
  Administered 2020-11-27 – 2020-11-29 (×3): 100 mg via ORAL
  Filled 2020-11-27 (×3): qty 1

## 2020-11-27 MED ORDER — LORAZEPAM 2 MG/ML IJ SOLN
INTRAMUSCULAR | Status: AC
  Administered 2020-11-27: 1 mg
  Filled 2020-11-27: qty 1

## 2020-11-27 MED ORDER — SODIUM CHLORIDE 0.9% FLUSH: 10.0000 mL | INTRAVENOUS | Status: DC | PRN

## 2020-11-27 MED ORDER — NITROGLYCERIN 0.4 MG SL SUBL: 0.4000 mg | SUBLINGUAL_TABLET | SUBLINGUAL | Status: DC | PRN

## 2020-11-27 MED ORDER — FAMOTIDINE 20 MG PO TABS
20.0000 mg | ORAL_TABLET | Freq: Every day | ORAL | Status: DC
  Administered 2020-11-28 – 2020-11-30 (×3): 20 mg via ORAL
  Filled 2020-11-27 (×4): qty 1

## 2020-11-27 MED ORDER — SODIUM CHLORIDE 0.9% FLUSH: 3.0000 mL | Freq: Once | INTRAVENOUS | Status: DC

## 2020-11-27 MED ORDER — LEVOTHYROXINE SODIUM 25 MCG PO TABS
25.0000 ug | ORAL_TABLET | Freq: Every day | ORAL | Status: DC
  Administered 2020-11-28 – 2020-11-30 (×3): 25 ug via ORAL
  Filled 2020-11-27 (×3): qty 1

## 2020-11-27 MED ORDER — DOCUSATE SODIUM 100 MG PO CAPS
100.0000 mg | ORAL_CAPSULE | ORAL | Status: DC
  Administered 2020-11-29: 100 mg via ORAL
  Filled 2020-11-27 (×2): qty 1

## 2020-11-27 MED ORDER — SODIUM CHLORIDE 0.9 % IV SOLN
2.0000 g | Freq: Once | INTRAVENOUS | Status: AC
  Administered 2020-11-27: 2 g via INTRAVENOUS
  Filled 2020-11-27: qty 20

## 2020-11-27 MED ORDER — IOHEXOL 350 MG/ML SOLN
50.0000 mL | Freq: Once | INTRAVENOUS | Status: AC | PRN
  Administered 2020-11-27: 50 mL via INTRAVENOUS

## 2020-11-27 MED ORDER — HALOPERIDOL LACTATE 5 MG/ML IJ SOLN
2.0000 mg | Freq: Once | INTRAMUSCULAR | Status: AC
  Administered 2020-11-27: 2 mg via INTRAVENOUS
  Filled 2020-11-27: qty 1

## 2020-11-27 MED ORDER — SODIUM CHLORIDE 0.9 % IV SOLN
500.0000 mg | INTRAVENOUS | Status: DC
  Administered 2020-11-28: 500 mg via INTRAVENOUS
  Filled 2020-11-27 (×2): qty 500

## 2020-11-27 NOTE — ED Provider Notes (Addendum)
[facility] EMERGENCY DEPARTMENT Provider Note   CSN: [id number] Arrival date & time: 11/27/20  1028  An emergency department physician performed an initial assessment on this suspected stroke patient at 1029.  History Chief Complaint  Patient presents with   Code Stroke    Charlotte Powell is a 87 y.o. female with PMH A. fib on Eliquis, GERD, HTN, coronary artery disease who presents the emergency department for evaluation of aphasia and disorientation.  Patient arrives as a stroke alert with last known well at 7:30 AM, with staff noting symptoms at approximately 9:30 AM.  Additional review of systems unable to be obtained as the patient is currently encephalopathic.  HPI     Past Medical History:  Diagnosis Date   CAD (coronary artery disease)    Diverticulitis    GERD (gastroesophageal reflux disease)    HLD (hyperlipidemia)    HTN (hypertension)    Hypothyroidism     Patient Active Problem List   Diagnosis Date Noted   Gastroenteritis 11/16/2020   Nausea & vomiting 11/16/2020   A-fib (HCC) 11/16/2020    Past Surgical History:  Procedure Laterality Date   ANKLE FRACTURE SURGERY     CHOLECYSTECTOMY     CORONARY STENT PLACEMENT     FEMUR FRACTURE SURGERY     WRIST FRACTURE SURGERY       OB History   No obstetric history on file.     Family History  Problem Relation Age of Onset   Stomach cancer Neg Hx     Social History   Tobacco Use   Smoking status: Never   Smokeless tobacco: Never  Substance Use Topics   Alcohol use: Never   Drug use: Never    Home Medications Prior to Admission medications   Medication Sig Start Date End Date Taking? Authorizing Provider  acetaminophen (TYLENOL) 325 MG tablet Take 650 mg by mouth every 8 (eight) hours as needed (for pain).   Yes [provider]  acetaminophen (TYLENOL) 500 MG tablet Take 1,000 mg by mouth at bedtime.   Yes [provider]  Cholecalciferol 25 MCG (1000 UT)  capsule Take 1,000 Units by mouth daily.   Yes [provider]  diclofenac Sodium (VOLTAREN) 1 % GEL Apply 4 g topically in the morning and at bedtime. Apply to right side of neck 10/23/20  Yes [provider]  docusate sodium (COLACE) 100 MG capsule Take 100 mg by mouth every other day.   Yes [provider]  ELIQUIS 5 MG TABS tablet Take 5 mg by mouth 2 (two) times daily. 10/26/20  Yes [provider]  famotidine (PEPCID) 20 MG tablet Take 20 mg by mouth daily. 11/01/20  Yes [provider]  fluticasone (FLONASE) 50 MCG/ACT nasal spray Place 1 spray into both nostrils 2 (two) times daily as needed for allergies. 08/07/20  Yes [provider]  folic acid (FOLVITE) 800 MCG tablet Take 800 mcg by mouth daily.   Yes [provider]  gabapentin (NEURONTIN) 100 MG capsule Take 100 mg by mouth at bedtime.   Yes [provider]  levothyroxine (SYNTHROID) 25 MCG tablet Take 25 mcg by mouth daily before breakfast.   Yes [provider]  nitroGLYCERIN (NITROSTAT) 0.4 MG SL tablet Place 0.4 mg under the tongue every 5 (five) minutes as needed for chest pain. 08/07/20  Yes [provider]  pravastatin (PRAVACHOL) 80 MG tablet Take 80 mg by mouth at bedtime. 10/25/20  Yes [provider]  Allergies    Amoxicillin and Hydrocodone  Review of Systems   Review of Systems  Unable to perform ROS: Mental status change   Physical Exam Updated Vital Signs BP (!) 176/102   Pulse 95   Temp (!) 97.2 F (36.2 C) (Oral)   Resp (!) 30   Ht 5\' 3"  (1.6 m)   Wt 52.6 kg   SpO2 98%   BMI 20.55 kg/m   Physical Exam Vitals and nursing note reviewed.  Constitutional:      General: She is not in acute distress.    Appearance: She is well-developed.  HENT:     Head: Normocephalic and atraumatic.  Eyes:     Conjunctiva/sclera: Conjunctivae normal.  Cardiovascular:     Rate and Rhythm: Normal rate and regular rhythm.      Heart sounds: No murmur heard. Pulmonary:     Effort: Pulmonary effort is normal. No respiratory distress.     Breath sounds: Normal breath sounds.  Abdominal:     Palpations: Abdomen is soft.     Tenderness: There is no abdominal tenderness.  Musculoskeletal:     Cervical back: Neck supple.  Skin:    General: Skin is warm and dry.  Neurological:     Mental Status: She is alert. She is disoriented.    ED Results / Procedures / Treatments   Labs (all labs ordered are listed, but only abnormal results are displayed) Labs Reviewed  CBC - Abnormal; Notable for the following components:      Result Value   MCV 102.3 (*)    MCH 34.2 (*)    All other components within normal limits  DIFFERENTIAL - Abnormal; Notable for the following components:   Lymphs Abs 4.8 (*)    All other components within normal limits  COMPREHENSIVE METABOLIC PANEL - Abnormal; Notable for the following components:   Total Protein 6.4 (*)    Total Bilirubin 1.7 (*)    All other components within normal limits  LACTIC ACID, PLASMA - Abnormal; Notable for the following components:   Lactic Acid, Venous 2.2 (*)    All other components within normal limits  I-STAT CHEM 8, ED - Abnormal; Notable for the following components:   Calcium, Ion 1.07 (*)    All other components within normal limits  PROTIME-INR  APTT  URINALYSIS, ROUTINE W REFLEX MICROSCOPIC  TSH  AMMONIA  LACTIC ACID, PLASMA  CBG MONITORING, ED    EKG None  Radiology CT HEAD CODE STROKE WO CONTRAST  Result Date: 11/27/2020 CLINICAL DATA:  Code stroke.  Neuro deficit, acute, stroke suspected EXAM: CT HEAD WITHOUT CONTRAST TECHNIQUE: Contiguous axial images were obtained from the base of the skull through the vertex without intravenous contrast. COMPARISON:  None. FINDINGS: Brain: There is no acute intracranial hemorrhage, mass effect, or edema. Gray-white differentiation is preserved. There is no extra-axial fluid collection. Patchy and  confluent areas of hypoattenuation in the supratentorial white matter are nonspecific but probably reflect moderate chronic microvascular ischemic changes. Prominence of the ventricles and sulci reflects parenchymal volume loss. Vascular: No hyperdense vessel.There is atherosclerotic calcification at the skull base. Skull: Calvarium is unremarkable. Sinuses/Orbits: No acute finding. Other: None. ASPECTS ([place] Stroke Program Early CT Score) - Ganglionic level infarction (caudate, lentiform nuclei, internal capsule, insula, M1-M3 cortex): 7 - Supraganglionic infarction (M4-M6 cortex): 3 Total score (0-10 with 10 being normal): 10 IMPRESSION: There is no acute intracranial hemorrhage or evidence of acute infarction. ASPECT score is 10. Moderate chronic microvascular ischemic  changes. Superimposed acute small vessel infarct is difficult to exclude. These results were communicated to Dr. Thomasena Edis at 10:42 am on 11/27/2020 by text page via the [facility] messaging system. Electronically Signed   By: Guadlupe Spanish M.D.   On: 11/27/2020 10:44   CT ANGIO HEAD NECK W WO CM (CODE STROKE)  Result Date: 11/27/2020 CLINICAL DATA:  Stroke, follow up EXAM: CT ANGIOGRAPHY HEAD AND NECK TECHNIQUE: Multidetector CT imaging of the head and neck was performed using the standard protocol during bolus administration of intravenous contrast. Multiplanar CT image reconstructions and MIPs were obtained to evaluate the vascular anatomy. Carotid stenosis measurements (when applicable) are obtained utilizing NASCET criteria, using the distal internal carotid diameter as the denominator. CONTRAST:  78mL OMNIPAQUE IOHEXOL 350 MG/ML SOLN COMPARISON:  None. FINDINGS: CTA NECK Aortic arch: Mixed plaque along the arch. Great vessel origins are patent. No high-grade stenosis of the proximal subclavian arteries. Right carotid system: Patent. Mild primarily calcified plaque along the proximal internal carotid with minimal stenosis. Left carotid  system: Patent. Mild primarily calcified plaque along the proximal internal carotid with minimal stenosis. Vertebral arteries: Patent. Calcified plaque at the left vertebral origin without high-grade stenosis. Skeleton: Degenerative changes of the cervical spine. Other neck: Unremarkable. Upper chest: No apical lung mass. Review of the MIP images confirms the above findings CTA HEAD Anterior circulation: Intracranial internal carotid arteries are patent with calcified plaque but no significant stenosis. Anterior and middle cerebral arteries are patent. Anterior communicating artery is present. Posterior circulation: Intracranial vertebral arteries are patent. Basilar artery is patent. Major cerebellar artery origins are patent. Posterior cerebral arteries are patent. Basilar artery terminates as the right PCA. The left PCA has a fetal origin. Venous sinuses: Patent as allowed by contrast bolus timing. Review of the MIP images confirms the above findings IMPRESSION: No large vessel occlusion, hemodynamically significant stenosis, or evidence of dissection. These results were communicated to Dr. Thomasena Edis At 10:57 am on 11/27/2020 by text page via the [facility] messaging system. Electronically Signed   By: Guadlupe Spanish M.D.   On: 11/27/2020 11:07    Procedures Procedures   Medications Ordered in ED Medications  sodium chloride flush (NS) 0.9 % injection 3 mL (3 mLs Intravenous Not Given 11/27/20 1251)  LORazepam (ATIVAN) 2 MG/ML injection (1 mg  Given 11/27/20 1045)  iohexol (OMNIPAQUE) 350 MG/ML injection 50 mL (50 mLs Intravenous Contrast Given 11/27/20 1052)  haloperidol lactate (HALDOL) injection 2 mg (2 mg Intravenous Given 11/27/20 1136)    ED Course  I have reviewed the triage vital signs and the nursing notes.  Pertinent labs & imaging results that were available during my care of the patient were reviewed by me and considered in my medical decision making (see chart for details).  Clinical  Course as of 11/27/20 1405  Tue Nov 27, 2020  1401 BP(!): 167/83 [MK]    Clinical Course User Index [MK] Gurjot Brisco, Wyn Forster, MD   MDM Rules/Calculators/A&P                           Patient seen the emergency department as a stroke alert due to concern for aphasia and disorientation.  Physical exam is large unremarkable but does reveal a encephalopathic patient unable to follow commands but is protecting her airway without difficulty.  Initial vital signs reveal hypothermia to 96.5 and repeat temperature 97.2.  Patient mildly tachypneic but maintaining oxygen saturations at 100% on room air.  CT head  stroke unremarkable, CT angio unremarkable.  Neurology agrees that this is less likely stroke and more likely underlying encephalopathy metabolic versus infectious.  Laboratory evaluation largely unremarkable with no electrolyte abnormalities, urinalysis unremarkable, ammonia and TSH unremarkable, lactic elevated to 2.2.  Fluid resuscitation initiated at 30 cc/kg with 1.5 L of lactated Ringer's.  Chest x-ray with left lower opacity concerning for possible pneumonia.  Ceftriaxone azithromycin initiated. patient require admission for encephalopathy and concern for pneumonia. Final Clinical Impression(s) / ED Diagnoses Final diagnoses:  None    Rx / DC Orders ED Discharge Orders     None        Ripley Bogosian, MD 11/27/20 1405    Glendora Score, MD 11/27/20 1406

## 2020-11-27 NOTE — ED Notes (Signed)
Patient sleeping. Pt family member refused BP and temp and is requesting that meds be postponed.

## 2020-11-27 NOTE — H&P (Signed)
History and Physical    Charlotte Powell ELF:[id number] DOB: [birth year] DOA: 11/27/2020  PCP: System, Provider Not In  Patient coming from: Assisted living facility  I have personally briefly reviewed patient's old medical records available.   Chief Complaint: Altered mental status and aphasia  HPI: Charlotte Powell is a 87 y.o. female with medical history significant of paroxysmal A. fib and therapeutic on Eliquis, history of hypertension and hyperlipidemia, hypothyroidism who was recently admitted to hospital with nausea and vomiting and symptomatically treated brought back to the ER as code stroke as she was confused and aphasic in the morning.  When she was brought to the ER she was crying and moaning but unable to speak any meaningful words.  Patient is still encephalopathic but somehow more awake than earlier.  She cannot tell what is wrong with her.  She does not know she is in the hospital. ED Course: Patient awake but confused and agitated.  She was screaming.  No focal logical deficit.  Moving all extremities.  Stroke alert was called.  Seen by neurology.  Initial CT scan of the head and CT angiogram was essentially normal.  Urinalysis is normal.  Chest x-ray with possible left lower lobe pneumonia.  Lactic acid 2.2. COVID-19 and influenza swab pending. Acute stroke ruled out, admission requested for acute metabolic encephalopathy.  I was able to talk to patient's daughter at the bedside who tells me that 2 days ago she was at her baseline.  Patient does not have any issues on usual days.  She had episode of agitation and confusion in March 2022 when she had a left hip fracture and was in the hospital.  That subsequently improved.  No reported new medication use.  Daughter stated that she was on Benadryl in the past and never had any trouble.  Does not look like she is currently on any sleeping pills.  Review of Systems: all systems are reviewed and pertinent positive as per HPI otherwise  rest are negative.    Past Medical History:  Diagnosis Date   CAD (coronary artery disease)    Diverticulitis    GERD (gastroesophageal reflux disease)    HLD (hyperlipidemia)    HTN (hypertension)    Hypothyroidism     Past Surgical History:  Procedure Laterality Date   ANKLE FRACTURE SURGERY     CHOLECYSTECTOMY     CORONARY STENT PLACEMENT     FEMUR FRACTURE SURGERY     WRIST FRACTURE SURGERY      Social history   reports that she has never smoked. She has never used smokeless tobacco. She reports that she does not drink alcohol and does not use drugs.  Allergies  Allergen Reactions   Amoxicillin    Hydrocodone     Confusion and AMS    Family History  Problem Relation Age of Onset   Stomach cancer Neg Hx      Prior to Admission medications   Medication Sig Start Date End Date Taking? Authorizing Provider  acetaminophen (TYLENOL) 325 MG tablet Take 650 mg by mouth every 8 (eight) hours as needed (for pain).   Yes [provider]  acetaminophen (TYLENOL) 500 MG tablet Take 1,000 mg by mouth at bedtime.   Yes [provider]  Cholecalciferol 25 MCG (1000 UT) capsule Take 1,000 Units by mouth daily.   Yes [provider]  diclofenac Sodium (VOLTAREN) 1 % GEL Apply 4 g topically in the morning and at bedtime. Apply to right side of neck  10/23/20  Yes [provider]  docusate sodium (COLACE) 100 MG capsule Take 100 mg by mouth every other day.   Yes [provider]  ELIQUIS 5 MG TABS tablet Take 5 mg by mouth 2 (two) times daily. 10/26/20  Yes [provider]  famotidine (PEPCID) 20 MG tablet Take 20 mg by mouth daily. 11/01/20  Yes [provider]  fluticasone (FLONASE) 50 MCG/ACT nasal spray Place 1 spray into both nostrils 2 (two) times daily as needed for allergies. 08/07/20  Yes [provider]  folic acid (FOLVITE) 800 MCG tablet Take 800 mcg by mouth daily.   Yes [provider]  gabapentin  (NEURONTIN) 100 MG capsule Take 100 mg by mouth at bedtime.   Yes [provider]  levothyroxine (SYNTHROID) 25 MCG tablet Take 25 mcg by mouth daily before breakfast.   Yes [provider]  nitroGLYCERIN (NITROSTAT) 0.4 MG SL tablet Place 0.4 mg under the tongue every 5 (five) minutes as needed for chest pain. 08/07/20  Yes [provider]  pravastatin (PRAVACHOL) 80 MG tablet Take 80 mg by mouth at bedtime. 10/25/20  Yes [provider]    Physical Exam: Vitals:   11/27/20 1230 11/27/20 1245 11/27/20 1300 11/27/20 1330  BP: (!) 180/102 (!) 176/102 (!) 161/45 (!) 167/83  Pulse: 95 95 100 (!) 108  Resp: (!) 23 (!) 30 (!) 26 (!) 26  Temp:      TempSrc:      SpO2: 100% 98% 100% 98%  Weight:      Height:        Constitutional: Patient is on room air.  She looks alert and awake but confused and impulsive.  She is agitated somehow.  Able to follow simple commands but not keep up conversation. Vitals:   11/27/20 1230 11/27/20 1245 11/27/20 1300 11/27/20 1330  BP: (!) 180/102 (!) 176/102 (!) 161/45 (!) 167/83  Pulse: 95 95 100 (!) 108  Resp: (!) 23 (!) 30 (!) 26 (!) 26  Temp:      TempSrc:      SpO2: 100% 98% 100% 98%  Weight:      Height:       Eyes: PERRL, lids and conjunctivae normal ENMT: Mucous membranes are moist. Posterior pharynx clear of any exudate or lesions.Normal dentition.  Neck: normal, supple, no masses, no thyromegaly Respiratory: clear to auscultation bilaterally, no wheezing, no crackles. Normal respiratory effort. No accessory muscle use.  Cardiovascular: Regular rate and rhythm, no murmurs / rubs / gallops. No extremity edema. 2+ pedal pulses. No carotid bruits.  Abdomen: no tenderness, no masses palpated. No hepatosplenomegaly. Bowel sounds positive.  Musculoskeletal: no clubbing / cyanosis. No joint deformity upper and lower extremities. Good ROM, no contractures. Normal muscle tone.  Skin: no rashes, lesions, ulcers. No  induration Neurologic: Confused and impulsive.  Speech is clear.  Moves all extremities.  Sensation intact, DTR normal. Strength 5/5 in all 4.  Psychiatric: Impaired judgment and insight. Alert and oriented x 3.    Labs on Admission: I have personally reviewed following labs and imaging studies  CBC: Recent Labs  Lab 11/27/20 1033 11/27/20 1039  WBC 8.2  --   NEUTROABS 2.4  --   HGB 14.6 13.9  HCT 43.7 41.0  MCV 102.3*  --   PLT 181  --    Basic Metabolic Panel: Recent Labs  Lab 11/27/20 1033 11/27/20 1039  NA 136 137  K 4.7 4.7  CL 104 105  CO2  22  --   GLUCOSE 99 98  BUN 8 8  CREATININE 0.76 0.50  CALCIUM 9.7  --    GFR: Estimated Creatinine Clearance: 38.7 mL/min (by C-G formula based on SCr of 0.5 mg/dL). Liver Function Tests: Recent Labs  Lab 11/27/20 1033  AST 26  ALT 9  ALKPHOS 85  BILITOT 1.7*  PROT 6.4*  ALBUMIN 3.7   No results for input(s): LIPASE, AMYLASE in the last 168 hours. Recent Labs  Lab 11/27/20 1226  AMMONIA 26   Coagulation Profile: Recent Labs  Lab 11/27/20 1033  INR 1.2   Cardiac Enzymes: No results for input(s): CKTOTAL, CKMB, CKMBINDEX, TROPONINI in the last 168 hours. BNP (last 3 results) No results for input(s): PROBNP in the last 8760 hours. HbA1C: No results for input(s): HGBA1C in the last 72 hours. CBG: No results for input(s): GLUCAP in the last 168 hours. Lipid Profile: No results for input(s): CHOL, HDL, LDLCALC, TRIG, CHOLHDL, LDLDIRECT in the last 72 hours. Thyroid Function Tests: Recent Labs    11/27/20 1226  TSH 4.119   Anemia Panel: No results for input(s): VITAMINB12, FOLATE, FERRITIN, TIBC, IRON, RETICCTPCT in the last 72 hours. Urine analysis:    Component Value Date/Time   COLORURINE STRAW (A) 11/27/2020 1226   APPEARANCEUR CLEAR 11/27/2020 1226   LABSPEC 1.018 11/27/2020 1226   PHURINE 8.0 11/27/2020 1226   GLUCOSEU NEGATIVE 11/27/2020 1226   HGBUR NEGATIVE 11/27/2020 1226   BILIRUBINUR  NEGATIVE 11/27/2020 1226   KETONESUR NEGATIVE 11/27/2020 1226   PROTEINUR NEGATIVE 11/27/2020 1226   NITRITE NEGATIVE 11/27/2020 1226   LEUKOCYTESUR NEGATIVE 11/27/2020 1226    Radiological Exams on Admission: DG Chest Portable 1 View  Result Date: 11/27/2020 CLINICAL DATA:  r/o PNA EXAM: PORTABLE CHEST 1 VIEW COMPARISON:  None. FINDINGS: Left basilar opacities. No visible pleural effusions or pneumothorax. Limited visualization of the left lung apex due to overlapping neck/head. Mild enlargement of the cardiac silhouette. Severe left glenohumeral degenerative change with probably posttraumatic deformity of the humeral neck. Osteopenia. IMPRESSION: Left basilar opacities my which could represent atelectasis, aspiration, and/or pneumonia. Electronically Signed   By: Feliberto Harts M.D.   On: 11/27/2020 13:52   CT HEAD CODE STROKE WO CONTRAST  Result Date: 11/27/2020 CLINICAL DATA:  Code stroke.  Neuro deficit, acute, stroke suspected EXAM: CT HEAD WITHOUT CONTRAST TECHNIQUE: Contiguous axial images were obtained from the base of the skull through the vertex without intravenous contrast. COMPARISON:  None. FINDINGS: Brain: There is no acute intracranial hemorrhage, mass effect, or edema. Gray-white differentiation is preserved. There is no extra-axial fluid collection. Patchy and confluent areas of hypoattenuation in the supratentorial white matter are nonspecific but probably reflect moderate chronic microvascular ischemic changes. Prominence of the ventricles and sulci reflects parenchymal volume loss. Vascular: No hyperdense vessel.There is atherosclerotic calcification at the skull base. Skull: Calvarium is unremarkable. Sinuses/Orbits: No acute finding. Other: None. ASPECTS ([place]berta Stroke Program Early CT Score) - Ganglionic level infarction (caudate, lentiform nuclei, internal capsule, insula, M1-M3 cortex): 7 - Supraganglionic infarction (M4-M6 cortex): 3 Total score (0-10 with 10 being  normal): 10 IMPRESSION: There is no acute intracranial hemorrhage or evidence of acute infarction. ASPECT score is 10. Moderate chronic microvascular ischemic changes. Superimposed acute small vessel infarct is difficult to exclude. These results were communicated to Dr. Thomasena Edis at 10:42 am on 11/27/2020 by text page via the [facility] messaging system. Electronically Signed   By: Guadlupe Spanish M.D.   On: 11/27/2020 10:44   CT  ANGIO HEAD NECK W WO CM (CODE STROKE)  Result Date: 11/27/2020 CLINICAL DATA:  Stroke, follow up EXAM: CT ANGIOGRAPHY HEAD AND NECK TECHNIQUE: Multidetector CT imaging of the head and neck was performed using the standard protocol during bolus administration of intravenous contrast. Multiplanar CT image reconstructions and MIPs were obtained to evaluate the vascular anatomy. Carotid stenosis measurements (when applicable) are obtained utilizing NASCET criteria, using the distal internal carotid diameter as the denominator. CONTRAST:  50mL OMNIPAQUE IOHEXOL 350 MG/ML SOLN COMPARISON:  None. FINDINGS: CTA NECK Aortic arch: Mixed plaque along the arch. Great vessel origins are patent. No high-grade stenosis of the proximal subclavian arteries. Right carotid system: Patent. Mild primarily calcified plaque along the proximal internal carotid with minimal stenosis. Left carotid system: Patent. Mild primarily calcified plaque along the proximal internal carotid with minimal stenosis. Vertebral arteries: Patent. Calcified plaque at the left vertebral origin without high-grade stenosis. Skeleton: Degenerative changes of the cervical spine. Other neck: Unremarkable. Upper chest: No apical lung mass. Review of the MIP images confirms the above findings CTA HEAD Anterior circulation: Intracranial internal carotid arteries are patent with calcified plaque but no significant stenosis. Anterior and middle cerebral arteries are patent. Anterior communicating artery is present. Posterior circulation:  Intracranial vertebral arteries are patent. Basilar artery is patent. Major cerebellar artery origins are patent. Posterior cerebral arteries are patent. Basilar artery terminates as the right PCA. The left PCA has a fetal origin. Venous sinuses: Patent as allowed by contrast bolus timing. Review of the MIP images confirms the above findings IMPRESSION: No large vessel occlusion, hemodynamically significant stenosis, or evidence of dissection. These results were communicated to Dr. Thomasena Edis At 10:57 am on 11/27/2020 by text page via the [facility] messaging system. Electronically Signed   By: Guadlupe Spanish M.D.   On: 11/27/2020 11:07    EKG: Independently reviewed.  Normal sinus rhythm.  Assessment/Plan Principal Problem:   Encephalopathy Active Problems:   Community acquired pneumonia     1.  Acute encephalopathy: Suspect toxic metabolic encephalopathy. CT stroke and CT angiogram of the head with no evidence of acute stroke, less likely a stroke.  We will continue neurochecks and speech, PT and OT.  Neurology following.  She is already therapeutic on Eliquis, no indication for tPA or thrombectomy. She will not tolerate MRI today, will try to avoid sedation as we may not get much information with normal repeated CT scans. -Check COVID and influenza -We will check UDS -Could not get information from family to see whether she had any new medication used , any over-the-counter medication used at night. Symptomatic treatment.  Will avoid benzodiazepine.  Use Haldol as needed. All-time fall and delirium precautions.  2.  Left lower lobe pneumonia: Suspected. Antibiotics to treat bacterial pneumonia with Rocephin and azithromycin. Chest physiotherapy, incentive spirometry, deep breathing exercises, sputum induction, mucolytic's and bronchodilators. Sputum cultures, blood cultures, Legionella and streptococcal antigen. Supplemental oxygen to keep saturations more than 90%.  3.  Paroxysmal A. fib:  Currently in sinus rhythm.  Rate controlled and therapeutic on Eliquis.  Continue.  4.  Hypothyroidism: Euthyroid on Synthroid.  Continue.  5.  Hyperlipidemia: Tolerating statin.  Continue.   DVT prophylaxis: Eliquis Code Status: DNR.  Documentation present on the chart. Family Communication: Daughter at the bedside. Disposition Plan: Assisted living facility. Consults called: Neurology. Admission status: Observation.  Telemetry.   Dorcas Carrow MD [facility] Pager [phone]

## 2020-11-27 NOTE — Consult Note (Addendum)
Neurology Consult H&P  Charlotte Powell MR# [id number] 11/27/2020  CC: aphasia  History is obtained from: EMS and chart.  HPI: Charlotte Powell is a 87 y.o. female PMHx as reviewed below, A fib (apixaban), lives in assisted living and walks with walker and is normally oriented and fluent. She was later found to be aphasic and EMS transported. On arrival she was crying, moaning but not able to speak.   LKW: 0730 tNK given: Not a candidate. IR Thrombectomy No, LVO Modified Rankin Scale: 1-No significant post stroke disability and can perform usual duties with stroke symptoms NIHSS:  LOC Responsiveness 0 LOC Questions 2 LOC Commands 2 Horizontal eye movement 0 Visual field 0 Facial palsy 0 Motor arm - Right arm 2 Motor arm - Left arm 2 Motor leg - Right leg 2 Motor leg - Left leg 2 Limb ataxia 0 Sensory test 0 Language 3 Speech 2 Extinction and inattention 0 NIH Stroke Scale/Score (NIHSS) 17  ROS:  Unable to assess due to encephalopathy.  Past Medical History:  Diagnosis Date   CAD (coronary artery disease)    Diverticulitis    GERD (gastroesophageal reflux disease)    HLD (hyperlipidemia)    HTN (hypertension)    Hypothyroidism    Family History  Problem Relation Age of Onset   Stomach cancer Neg Hx    Social History:  reports that she has never smoked. She has never used smokeless tobacco. She reports that she does not drink alcohol and does not use drugs.  Prior to Admission medications   Medication Sig Start Date End Date Taking? Authorizing Provider  acetaminophen (TYLENOL) 325 MG tablet Take 650 mg by mouth every 8 (eight) hours as needed (for pain).    [provider]  acetaminophen (TYLENOL) 500 MG tablet Take 1,000 mg by mouth at bedtime.    [provider]  Cholecalciferol 25 MCG (1000 UT) capsule Take 1,000 Units by mouth daily.    [provider]  diclofenac Sodium (VOLTAREN) 1 % GEL Apply 1 application to right side of neck  topically two times a day for pain 10/23/20   [provider]  docusate sodium (COLACE) 100 MG capsule Take 100 mg by mouth daily.    [provider]  ELIQUIS 5 MG TABS tablet Take 5 mg by mouth 2 (two) times daily. 10/26/20   [provider]  famotidine (PEPCID) 20 MG tablet Take 20 mg by mouth daily. 11/01/20   [provider]  fluticasone (FLONASE) 50 MCG/ACT nasal spray Place 1 spray into both nostrils every 12 (twelve) hours. 08/07/20   [provider]  folic acid (FOLVITE) 800 MCG tablet Take 800 mcg by mouth daily.    [provider]  gabapentin (NEURONTIN) 100 MG capsule Take 100 mg by mouth at bedtime.    [provider]  levothyroxine (SYNTHROID) 25 MCG tablet Take 25 mcg by mouth daily before breakfast.    [provider]  nitroGLYCERIN (NITROSTAT) 0.4 MG SL tablet Place 0.4 mg under the tongue every 5 (five) minutes as needed for chest pain. 08/07/20   [provider]  pravastatin (PRAVACHOL) 80 MG tablet Take 80 mg by mouth at bedtime. 10/25/20   [provider]   Exam: Current vital signs: There were no vitals taken for this visit.  Physical Exam  Constitutional: Appears well-developed and well-nourished.  Psych: agitated.  Eyes: No scleral injection HENT: No OP obstruction. Head: Normocephalic.  Cardiovascular: Normal rate and regular rhythm.  Respiratory: Effort  normal, symmetric excursions bilaterally, no audible wheezing. GI: Soft.  No distension. There is no tenderness.  Skin: WDI  Neuro: Mental Status: Patient is awake, confused and agitated. Screaming however no verbal output. Attempts to follow commands. Visual Fields are full. Pupils round, and reactive to light. EOMI without ptosis. Facial sensation is symmetric to temperature Facial movement is symmetric.  Hearing is intact to voice. Uvula midline and palate elevates symmetrically. Shoulder shrug is symmetric. Tongue is  midline without atrophy or fasciculations.  Tone is normal. Bulk is normal. Good strength in all extremities. Sensation is symmetric to light touch and temperature in the arms and legs. Deep Tendon Reflexes: unable to elicit due to agitation. Toes are downgoing bilaterally. Gait - Deferred  I have reviewed labs in epic and the pertinent results are: Glu 98  I have reviewed the images obtained: NCT head showed There is no acute intracranial hemorrhage or evidence of acute infarction. ASPECT score is 10. CTA head and neck showed No large vessel occlusion, hemodynamically significant stenosis, or evidence of dissection.  Assessment: Charlotte Powell is a 87 y.o. female PMHx a fib (apixaban) with acute aphasia and encephalopathy. Patient was very agitated  and required lorazepam 1mg  IV. Acute encephalopathy. Symptoms not completely suggestive of stroke and will need metabolic/infectious workup.  Plan: - MRI brain without contrast if the patient is able. - Recommend labs: HbA1c, lipid panel, TSH, ammonia, lactic acid. - Continue apixaban. - Blood pressure goal - normotension. - Recommend bedside Swallow screen. - Recommend Stroke education. - Recommend PT/OT/SLP consult. - If MRI is positive for stroke please call neurology.  This patient is critically ill and at significant risk of neurological worsening, death and care requires constant monitoring of vital signs, hemodynamics,respiratory and cardiac monitoring, neurological assessment, discussion with family, other specialists and medical decision making of high complexity. I spent 73 minutes of neurocritical care time  in the care of  this patient. This was time spent independent of any time provided by nurse practitioner or PA.  Electronically signed by:  , MD Page: Marisue Humble 11/27/2020, 10:39 AM

## 2020-11-27 NOTE — ED Notes (Signed)
Per provider CBG not needed d/t glucose result on ISTAT.

## 2020-11-27 NOTE — ED Triage Notes (Signed)
Pt BIB EMS from [facility] for a CODE STROKE. LKW 0730. Staff noted stroke like symptoms at 0930. Staff reported aphasic and disoriented. Pt normally A&Ox4 and ambulatory with walker. Pt on Eliquis.   VSS with EMS. CBG 118. 20G L AC

## 2020-11-27 NOTE — Code Documentation (Signed)
Pt is a 87 yr old female with h/o A Fib on eliquis living in facility. She was last known to be well at 0730, (able to talk, oriented, walking with walker). Later pt was found to be aphasic, and EMS was called. Code Stroke activated by EMS at 1015. Pt arrived [facility] at 1028. She was crying, moaning, but unable to speak. Airway cleared by EDP, labs drawn at bridge. Pt to CT at 1035. She remains mute, non-focal, agitated. (Please see timeline for complete NIHSS and times). CTNC neg for hemorrhage per Dr Thomasena Edis. Pt required 1 mg ativan to be still for CTA. CTA  was then obtained, and was negative for LVO per Dr Thomasena Edis. Pt returned to ED room 24. Pt will need q 2 hr VS and mNIHSS for 12 hrs, then q 4. Bedside handoff with EDRN complete. Pt not candidate for TNK as on Eliquis. Pt not eligible for NIR as LVO negative.

## 2020-11-28 DIAGNOSIS — Z79890 Hormone replacement therapy (postmenopausal): Secondary | ICD-10-CM | POA: Diagnosis not present

## 2020-11-28 DIAGNOSIS — J189 Pneumonia, unspecified organism: Secondary | ICD-10-CM | POA: Diagnosis not present

## 2020-11-28 DIAGNOSIS — I10 Essential (primary) hypertension: Secondary | ICD-10-CM | POA: Diagnosis present

## 2020-11-28 DIAGNOSIS — E785 Hyperlipidemia, unspecified: Secondary | ICD-10-CM | POA: Diagnosis present

## 2020-11-28 DIAGNOSIS — Z20822 Contact with and (suspected) exposure to covid-19: Secondary | ICD-10-CM | POA: Diagnosis present

## 2020-11-28 DIAGNOSIS — J690 Pneumonitis due to inhalation of food and vomit: Secondary | ICD-10-CM | POA: Diagnosis present

## 2020-11-28 DIAGNOSIS — Z7901 Long term (current) use of anticoagulants: Secondary | ICD-10-CM | POA: Diagnosis not present

## 2020-11-28 DIAGNOSIS — I480 Paroxysmal atrial fibrillation: Secondary | ICD-10-CM

## 2020-11-28 DIAGNOSIS — Z79899 Other long term (current) drug therapy: Secondary | ICD-10-CM | POA: Diagnosis not present

## 2020-11-28 DIAGNOSIS — K219 Gastro-esophageal reflux disease without esophagitis: Secondary | ICD-10-CM | POA: Diagnosis present

## 2020-11-28 DIAGNOSIS — Z955 Presence of coronary angioplasty implant and graft: Secondary | ICD-10-CM | POA: Diagnosis not present

## 2020-11-28 DIAGNOSIS — R4701 Aphasia: Secondary | ICD-10-CM | POA: Diagnosis present

## 2020-11-28 DIAGNOSIS — Z66 Do not resuscitate: Secondary | ICD-10-CM | POA: Diagnosis present

## 2020-11-28 DIAGNOSIS — G9340 Encephalopathy, unspecified: Secondary | ICD-10-CM | POA: Diagnosis present

## 2020-11-28 DIAGNOSIS — Z880 Allergy status to penicillin: Secondary | ICD-10-CM | POA: Diagnosis not present

## 2020-11-28 DIAGNOSIS — E039 Hypothyroidism, unspecified: Secondary | ICD-10-CM

## 2020-11-28 DIAGNOSIS — I2510 Atherosclerotic heart disease of native coronary artery without angina pectoris: Secondary | ICD-10-CM | POA: Diagnosis present

## 2020-11-28 DIAGNOSIS — G9341 Metabolic encephalopathy: Secondary | ICD-10-CM | POA: Diagnosis present

## 2020-11-28 DIAGNOSIS — Z885 Allergy status to narcotic agent status: Secondary | ICD-10-CM | POA: Diagnosis not present

## 2020-11-28 DIAGNOSIS — R54 Age-related physical debility: Secondary | ICD-10-CM | POA: Diagnosis present

## 2020-11-28 LAB — CBC
HCT: 37.9 % (ref 36.0–46.0)
Hemoglobin: 12.9 g/dL (ref 12.0–15.0)
MCH: 34 pg (ref 26.0–34.0)
MCHC: 34 g/dL (ref 30.0–36.0)
MCV: 100 fL (ref 80.0–100.0)
Platelets: 172 10*3/uL (ref 150–400)
RBC: 3.79 MIL/uL — ABNORMAL LOW (ref 3.87–5.11)
RDW: 13 % (ref 11.5–15.5)
WBC: 5.7 10*3/uL (ref 4.0–10.5)
nRBC: 0 % (ref 0.0–0.2)

## 2020-11-28 LAB — BASIC METABOLIC PANEL
Anion gap: 7 (ref 5–15)
BUN: 6 mg/dL — ABNORMAL LOW (ref 8–23)
CO2: 26 mmol/L (ref 22–32)
Calcium: 8.9 mg/dL (ref 8.9–10.3)
Chloride: 102 mmol/L (ref 98–111)
Creatinine, Ser: 0.74 mg/dL (ref 0.44–1.00)
GFR, Estimated: >60 mL/min (ref >60)
Glucose, Bld: 98 mg/dL (ref 70–99)
Potassium: 3.5 mmol/L (ref 3.5–5.1)
Sodium: 135 mmol/L (ref 135–145)

## 2020-11-28 LAB — LEGIONELLA PNEUMOPHILA SEROGP 1 UR AG: L. pneumophila Serogp 1 Ur Ag: NEGATIVE

## 2020-11-28 LAB — GLUCOSE, CAPILLARY: Glucose-Capillary: 88 mg/dL (ref 70–99)

## 2020-11-28 NOTE — Discharge Summary (Deleted)
PROGRESS NOTE        PATIENT DETAILS Name: Charlotte Powell Age: 87 y.o. Sex: female Date of Birth: 01/06/1929 Admit Date: 11/27/2020 Admitting Physician Dorcas Carrow, MD RFV:[id number], Provider Not In  Brief Narrative: Patient is a 87 y.o. female with history of PAF, HTN, HLD, hypothyroidism-resident of a local SNF-presented with confusion-thought to have acute metabolic encephalopathy in the setting of PNA and admitted to the hospitalist service.  Subjective: Much more awake and alert today.  Answering simple questions appropriately.  Daughter at bedside.  Objective: Vitals: Blood pressure (!) 98/51, pulse 75, temperature 99.2 F (37.3 C), temperature source Oral, resp. rate 18, height 5\' 3"  (1.6 m), weight 53 kg, SpO2 97 %.   Exam: Gen Exam:Alert awake-not in any distress HEENT:atraumatic, normocephalic Chest: B/L clear to auscultation anteriorly CVS:S1S2 regular Abdomen:soft non tender, non distended Extremities:no edema Neurology: Non focal Skin: no rash  Pertinent Labs/Radiology: WBC: 5.7 Hb: 12.9 Na: 135 K: 3.5 Creatinine: 0.74  10/11>>Blood culture: No growth 10/12>> blood culture: Pending  10/11>>CXR: Left basilar opacities 10/11>> CT head: No acute infarction/ICH. 10/11>> CT angio head/neck: No hemodynamically significant stenosis.  Assessment/Plan: Acute metabolic encephalopathy: Likely due to PNA-improved with IVF/IV antibiotics-she is much more awake and alert-but still somewhat slow and not yet at baseline.  Doubt further work-up including MRI brain is going to change management (already on Eliquis).  Discussed with daughter at bedside-we both agree to continue with IVF/IV antibiotics and defer MRI brain for now.  Left lobar pneumonia: Likely aspiration-continue Rocephin/Zithromax-given advanced age/frailty-suspect requires another few more days of hospitalization before consideration of discharge.  Appreciate SLP  evaluation.  PAF: Continue Eliquis-not on any rate control agents in the outpatient setting.  Hypothyroidism: Continue Synthroid-TSH stable.  HLD: Continue statin  Debility/deconditioning: Await PT/OT eval-at ALF prior to this hospitalization.  Procedures: None Consults: None DVT Prophylaxis: Eliquis Code Status:DNR Family Communication: Daughter at bedside  Time spent: 34 minutes-Greater than 50% of this time was spent in counseling, explanation of diagnosis, planning of further management, and coordination of care.  Diet: Diet Order             Diet Heart Room service appropriate? Yes; Fluid consistency: Thin  Diet effective now                      Disposition Plan: Status is: Observation  The patient will require care spanning > 2 midnights and should be moved to inpatient because: Inpatient level of care appropriate due to severity of illness  Dispo: The patient is from: ALF              Anticipated d/c is to: ALF              Patient currently is not medically stable to d/c.   Difficult to place patient No     Barriers to Discharge: Resolving encephalopathy-on IV antibiotics for pneumonia-although improved-not yet at baseline.    Antimicrobial agents: Anti-infectives (From admission, onward)    Start     Dose/Rate Route Frequency Ordered Stop   11/27/20 1945  cefTRIAXone (ROCEPHIN) 2 g in sodium chloride 0.9 % 100 mL IVPB        2 g 200 mL/hr over 30 Minutes Intravenous Every 24 hours 11/27/20 1934 12/02/20 1944   11/27/20 1945  azithromycin (ZITHROMAX) 500 mg in sodium chloride 0.9 %  250 mL IVPB        500 mg 250 mL/hr over 60 Minutes Intravenous Every 24 hours 11/27/20 1934 12/02/20 1944   11/27/20 1400  cefTRIAXone (ROCEPHIN) 2 g in sodium chloride 0.9 % 100 mL IVPB        2 g 200 mL/hr over 30 Minutes Intravenous  Once 11/27/20 1357 11/27/20 1715   11/27/20 1400  azithromycin (ZITHROMAX) 500 mg in sodium chloride 0.9 % 250 mL IVPB        500  mg 250 mL/hr over 60 Minutes Intravenous  Once 11/27/20 1357 11/27/20 1830        MEDICATIONS: Scheduled Meds:  acetaminophen  1,000 mg Oral QHS   apixaban  2.5 mg Oral BID   cholecalciferol  1,000 Units Oral Daily   docusate sodium  100 mg Oral QODAY   famotidine  20 mg Oral Daily   folic acid  1,000 mcg Oral Daily   gabapentin  100 mg Oral QHS   levothyroxine  25 mcg Oral QAC breakfast   pravastatin  80 mg Oral Daily   sodium chloride flush  10-40 mL Intracatheter Q12H   sodium chloride flush  3 mL Intravenous Once   Continuous Infusions:  azithromycin     cefTRIAXone (ROCEPHIN)  IV     PRN Meds:.acetaminophen, fluticasone, nitroGLYCERIN, sodium chloride flush   I have personally reviewed following labs and imaging studies  LABORATORY DATA: CBC: Recent Labs  Lab 11/27/20 1033 11/27/20 1039 11/27/20 1632 11/28/20 0747  WBC 8.2  --   --  5.7  NEUTROABS 2.4  --   --   --   HGB 14.6 13.9 13.6 12.9  HCT 43.7 41.0 40.0 37.9  MCV 102.3*  --   --  100.0  PLT 181  --   --  172    Basic Metabolic Panel: Recent Labs  Lab 11/27/20 1033 11/27/20 1039 11/27/20 1632 11/28/20 0747  NA 136 137 136 135  K 4.7 4.7 3.6 3.5  CL 104 105  --  102  CO2 22  --   --  26  GLUCOSE 99 98  --  98  BUN 8 8  --  6*  CREATININE 0.76 0.50  --  0.74  CALCIUM 9.7  --   --  8.9    GFR: Estimated Creatinine Clearance: 38.7 mL/min (by C-G formula based on SCr of 0.74 mg/dL).  Liver Function Tests: Recent Labs  Lab 11/27/20 1033  AST 26  ALT 9  ALKPHOS 85  BILITOT 1.7*  PROT 6.4*  ALBUMIN 3.7   No results for input(s): LIPASE, AMYLASE in the last 168 hours. Recent Labs  Lab 11/27/20 1226  AMMONIA 26    Coagulation Profile: Recent Labs  Lab 11/27/20 1033  INR 1.2    Cardiac Enzymes: No results for input(s): CKTOTAL, CKMB, CKMBINDEX, TROPONINI in the last 168 hours.  BNP (last 3 results) No results for input(s): PROBNP in the last 8760 hours.  Lipid  Profile: No results for input(s): CHOL, HDL, LDLCALC, TRIG, CHOLHDL, LDLDIRECT in the last 72 hours.  Thyroid Function Tests: Recent Labs    11/27/20 1226  TSH 4.119    Anemia Panel: No results for input(s): VITAMINB12, FOLATE, FERRITIN, TIBC, IRON, RETICCTPCT in the last 72 hours.  Urine analysis:    Component Value Date/Time   COLORURINE STRAW (A) 11/27/2020 1226   APPEARANCEUR CLEAR 11/27/2020 1226   LABSPEC 1.018 11/27/2020 1226   PHURINE 8.0 11/27/2020 1226   GLUCOSEU NEGATIVE  11/27/2020 1226   HGBUR NEGATIVE 11/27/2020 1226   BILIRUBINUR NEGATIVE 11/27/2020 1226   KETONESUR NEGATIVE 11/27/2020 1226   PROTEINUR NEGATIVE 11/27/2020 1226   NITRITE NEGATIVE 11/27/2020 1226   LEUKOCYTESUR NEGATIVE 11/27/2020 1226    Sepsis Labs: Lactic Acid, Venous    Component Value Date/Time   LATICACIDVEN 0.9 11/27/2020 1331    MICROBIOLOGY: Recent Results (from the past 240 hour(s))  Blood culture (routine x 2)     Status: None (Preliminary result)   Collection Time: 11/27/20  4:05 PM   Specimen: BLOOD RIGHT ARM  Result Value Ref Range Status   Specimen Description BLOOD RIGHT ARM  Final   Special Requests   Final    BOTTLES DRAWN AEROBIC AND ANAEROBIC Blood Culture adequate volume   Culture   Final    NO GROWTH < 24 HOURS Performed at [facility], [address] [place], [place] [address]    Report Status PENDING  Incomplete  Resp Panel by RT-PCR (Flu A&B, Covid) Nasopharyngeal Swab     Status: None   Collection Time: 11/27/20  6:30 PM   Specimen: Nasopharyngeal Swab; Nasopharyngeal(NP) swabs in vial transport medium  Result Value Ref Range Status   SARS Coronavirus 2 by RT PCR NEGATIVE NEGATIVE Final    Comment: (NOTE) SARS-CoV-2 target nucleic acids are NOT DETECTED.  The SARS-CoV-2 RNA is generally detectable in upper respiratory specimens during the acute phase of infection. The lowest concentration of SARS-CoV-2 viral copies this assay can detect is 138  copies/mL. A negative result does not preclude SARS-Cov-2 infection and should not be used as the sole basis for treatment or other patient management decisions. A negative result may occur with  improper specimen collection/handling, submission of specimen other than nasopharyngeal swab, presence of viral mutation(s) within the areas targeted by this assay, and inadequate number of viral copies(<138 copies/mL). A negative result must be combined with clinical observations, patient history, and epidemiological information. The expected result is Negative.  Fact Sheet for Patients:  BloggerCourse.com  Fact Sheet for Healthcare Providers:  SeriousBroker.it  This test is no t yet approved or cleared by the [place] FDA and  has been authorized for detection and/or diagnosis of SARS-CoV-2 by FDA under an Emergency Use Authorization (EUA). This EUA will remain  in effect (meaning this test can be used) for the duration of the COVID-19 declaration under Section 564(b)(1) of the Act, 21 U.S.C.section 360bbb-3(b)(1), unless the authorization is terminated  or revoked sooner.       Influenza A by PCR NEGATIVE NEGATIVE Final   Influenza B by PCR NEGATIVE NEGATIVE Final    Comment: (NOTE) The Xpert Xpress SARS-CoV-2/FLU/RSV plus assay is intended as an aid in the diagnosis of influenza from Nasopharyngeal swab specimens and should not be used as a sole basis for treatment. Nasal washings and aspirates are unacceptable for Xpert Xpress SARS-CoV-2/FLU/RSV testing.  Fact Sheet for Patients: BloggerCourse.com  Fact Sheet for Healthcare Providers: SeriousBroker.it  This test is not yet approved or cleared by the [place] FDA and has been authorized for detection and/or diagnosis of SARS-CoV-2 by FDA under an Emergency Use Authorization (EUA). This EUA will remain in effect (meaning  this test can be used) for the duration of the COVID-19 declaration under Section 564(b)(1) of the Act, 21 U.S.C. section 360bbb-3(b)(1), unless the authorization is terminated or revoked.  Performed at [facility], [address], [place], [place] [address]     RADIOLOGY STUDIES/RESULTS: DG Chest  Portable 1 View  Result Date: 11/27/2020 CLINICAL DATA:  r/o PNA EXAM: PORTABLE CHEST 1 VIEW COMPARISON:  None. FINDINGS: Left basilar opacities. No visible pleural effusions or pneumothorax. Limited visualization of the left lung apex due to overlapping neck/head. Mild enlargement of the cardiac silhouette. Severe left glenohumeral degenerative change with probably posttraumatic deformity of the humeral neck. Osteopenia. IMPRESSION: Left basilar opacities my which could represent atelectasis, aspiration, and/or pneumonia. Electronically Signed   By: Feliberto Harts M.D.   On: 11/27/2020 13:52   CT HEAD CODE STROKE WO CONTRAST  Result Date: 11/27/2020 CLINICAL DATA:  Code stroke.  Neuro deficit, acute, stroke suspected EXAM: CT HEAD WITHOUT CONTRAST TECHNIQUE: Contiguous axial images were obtained from the base of the skull through the vertex without intravenous contrast. COMPARISON:  None. FINDINGS: Brain: There is no acute intracranial hemorrhage, mass effect, or edema. Gray-white differentiation is preserved. There is no extra-axial fluid collection. Patchy and confluent areas of hypoattenuation in the supratentorial white matter are nonspecific but probably reflect moderate chronic microvascular ischemic changes. Prominence of the ventricles and sulci reflects parenchymal volume loss. Vascular: No hyperdense vessel.There is atherosclerotic calcification at the skull base. Skull: Calvarium is unremarkable. Sinuses/Orbits: No acute finding. Other: None. ASPECTS ([place] Stroke Program Early CT Score) - Ganglionic level infarction (caudate, lentiform nuclei, internal capsule, insula, M1-M3  cortex): 7 - Supraganglionic infarction (M4-M6 cortex): 3 Total score (0-10 with 10 being normal): 10 IMPRESSION: There is no acute intracranial hemorrhage or evidence of acute infarction. ASPECT score is 10. Moderate chronic microvascular ischemic changes. Superimposed acute small vessel infarct is difficult to exclude. These results were communicated to Dr. Thomasena Edis at 10:42 am on 11/27/2020 by text page via the [facility] messaging system. Electronically Signed   By: Guadlupe Spanish M.D.   On: 11/27/2020 10:44   CT ANGIO HEAD NECK W WO CM (CODE STROKE)  Result Date: 11/27/2020 CLINICAL DATA:  Stroke, follow up EXAM: CT ANGIOGRAPHY HEAD AND NECK TECHNIQUE: Multidetector CT imaging of the head and neck was performed using the standard protocol during bolus administration of intravenous contrast. Multiplanar CT image reconstructions and MIPs were obtained to evaluate the vascular anatomy. Carotid stenosis measurements (when applicable) are obtained utilizing NASCET criteria, using the distal internal carotid diameter as the denominator. CONTRAST:  52mL OMNIPAQUE IOHEXOL 350 MG/ML SOLN COMPARISON:  None. FINDINGS: CTA NECK Aortic arch: Mixed plaque along the arch. Great vessel origins are patent. No high-grade stenosis of the proximal subclavian arteries. Right carotid system: Patent. Mild primarily calcified plaque along the proximal internal carotid with minimal stenosis. Left carotid system: Patent. Mild primarily calcified plaque along the proximal internal carotid with minimal stenosis. Vertebral arteries: Patent. Calcified plaque at the left vertebral origin without high-grade stenosis. Skeleton: Degenerative changes of the cervical spine. Other neck: Unremarkable. Upper chest: No apical lung mass. Review of the MIP images confirms the above findings CTA HEAD Anterior circulation: Intracranial internal carotid arteries are patent with calcified plaque but no significant stenosis. Anterior and middle cerebral  arteries are patent. Anterior communicating artery is present. Posterior circulation: Intracranial vertebral arteries are patent. Basilar artery is patent. Major cerebellar artery origins are patent. Posterior cerebral arteries are patent. Basilar artery terminates as the right PCA. The left PCA has a fetal origin. Venous sinuses: Patent as allowed by contrast bolus timing. Review of the MIP images confirms the above findings IMPRESSION: No large vessel occlusion, hemodynamically significant stenosis, or evidence of dissection. These results were communicated to Dr. Thomasena Edis At 10:57 am on  11/27/2020 by text page via the [facility] messaging system. Electronically Signed   By: Guadlupe Spanish M.D.   On: 11/27/2020 11:07     LOS: 0 days   Jeoffrey Massed, MD  [facility]    To contact the attending provider between 7A-7P or the covering provider during after hours 7P-7A, please log into the web site [email or web address] and access using universal [place] password for that web site. If you do not have the password, please call the hospital operator.  11/28/2020, 12:15 PM

## 2020-11-28 NOTE — Progress Notes (Signed)
PT Cancellation Note  Patient Details Name: Charlotte Powell MRN: [id number] DOB: [birth year]   Cancelled Treatment:    Reason Eval/Treat Not Completed: Fatigue/lethargy limiting ability to participate; attempted twice this am to see pt.  RN delivering meds then pt just finished with OT.  Will attempt again this pm.  Daughter in the room and appreciative.   Elray Mcgregor 11/28/2020, 11:16 AM Sheran Lawless, PT Acute Rehabilitation Services Pager:[phone] Office:[phone] 11/28/2020

## 2020-11-28 NOTE — Progress Notes (Signed)
PROGRESS NOTE        PATIENT DETAILS Name: Charlotte Powell Age: 87 y.o. Sex: female Date of Birth: August 17, 1928 Admit Date: 11/27/2020 Admitting Physician Dorcas Carrow, MD OFB:PZWCHE, Provider Not In  Brief Narrative: Patient is a 87 y.o. female with history of PAF, HTN, HLD, hypothyroidism-resident of a local SNF-presented with confusion-thought to have acute metabolic encephalopathy in the setting of PNA and admitted to the hospitalist service.  Subjective: Much more awake and alert today.  Answering simple questions appropriately.  Daughter at bedside.  Objective: Vitals: Blood pressure 125/79, pulse 69, temperature (!) 97.4 F (36.3 C), temperature source Oral, resp. rate 18, height 5\' 3"  (1.6 m), weight 53 kg, SpO2 96 %.   Exam: Gen Exam:Alert awake-not in any distress HEENT:atraumatic, normocephalic Chest: B/L clear to auscultation anteriorly CVS:S1S2 regular Abdomen:soft non tender, non distended Extremities:no edema Neurology: Non focal Skin: no rash  Pertinent Labs/Radiology: WBC: 5.7 Hb: 12.9 Na: 135 K: 3.5 Creatinine: 0.74  10/11>>Blood culture: No growth 10/12>> blood culture: Pending  10/11>>CXR: Left basilar opacities 10/11>> CT head: No acute infarction/ICH. 10/11>> CT angio head/neck: No hemodynamically significant stenosis.  Assessment/Plan: Acute metabolic encephalopathy: Likely due to PNA-improved with IVF/IV antibiotics-she is much more awake and alert-but still somewhat slow and not yet at baseline.  Doubt further work-up including MRI brain is going to change management (already on Eliquis).  Discussed with daughter at bedside-we both agree to continue with IVF/IV antibiotics and defer MRI brain for now.  Left lobar pneumonia: Likely aspiration-continue Rocephin/Zithromax-given advanced age/frailty-suspect requires another few more days of hospitalization before consideration of discharge.  Appreciate SLP  evaluation.  PAF: Continue Eliquis-not on any rate control agents in the outpatient setting.  Hypothyroidism: Continue Synthroid-TSH stable.  HLD: Continue statin  Debility/deconditioning: Await PT/OT eval-at ALF prior to this hospitalization.  Procedures: None Consults: None DVT Prophylaxis: Eliquis Code Status:DNR Family Communication: Daughter at bedside  Time spent: 26 minutes-Greater than 50% of this time was spent in counseling, explanation of diagnosis, planning of further management, and coordination of care.  Diet: Diet Order             Diet Heart Room service appropriate? Yes; Fluid consistency: Thin  Diet effective now                      Disposition Plan: Status is: Observation  The patient will require care spanning > 2 midnights and should be moved to inpatient because: Inpatient level of care appropriate due to severity of illness  Dispo: The patient is from: ALF              Anticipated d/c is to: ALF              Patient currently is not medically stable to d/c.   Difficult to place patient No     Barriers to Discharge: Resolving encephalopathy-on IV antibiotics for pneumonia-although improved-not yet at baseline.    Antimicrobial agents: Anti-infectives (From admission, onward)    Start     Dose/Rate Route Frequency Ordered Stop   11/27/20 1945  cefTRIAXone (ROCEPHIN) 2 g in sodium chloride 0.9 % 100 mL IVPB        2 g 200 mL/hr over 30 Minutes Intravenous Every 24 hours 11/27/20 1934 12/02/20 1944   11/27/20 1945  azithromycin (ZITHROMAX) 500 mg in sodium chloride 0.9 %  250 mL IVPB        500 mg 250 mL/hr over 60 Minutes Intravenous Every 24 hours 11/27/20 1934 12/02/20 1944   11/27/20 1400  cefTRIAXone (ROCEPHIN) 2 g in sodium chloride 0.9 % 100 mL IVPB        2 g 200 mL/hr over 30 Minutes Intravenous  Once 11/27/20 1357 11/27/20 1715   11/27/20 1400  azithromycin (ZITHROMAX) 500 mg in sodium chloride 0.9 % 250 mL IVPB        500  mg 250 mL/hr over 60 Minutes Intravenous  Once 11/27/20 1357 11/27/20 1830        MEDICATIONS: Scheduled Meds:  acetaminophen  1,000 mg Oral QHS   apixaban  2.5 mg Oral BID   cholecalciferol  1,000 Units Oral Daily   docusate sodium  100 mg Oral QODAY   famotidine  20 mg Oral Daily   folic acid  1,000 mcg Oral Daily   gabapentin  100 mg Oral QHS   levothyroxine  25 mcg Oral QAC breakfast   pravastatin  80 mg Oral Daily   sodium chloride flush  10-40 mL Intracatheter Q12H   sodium chloride flush  3 mL Intravenous Once   Continuous Infusions:  azithromycin     cefTRIAXone (ROCEPHIN)  IV     PRN Meds:.acetaminophen, fluticasone, nitroGLYCERIN, sodium chloride flush   I have personally reviewed following labs and imaging studies  LABORATORY DATA: CBC: Recent Labs  Lab 11/27/20 1033 11/27/20 1039 11/27/20 1632 11/28/20 0747  WBC 8.2  --   --  5.7  NEUTROABS 2.4  --   --   --   HGB 14.6 13.9 13.6 12.9  HCT 43.7 41.0 40.0 37.9  MCV 102.3*  --   --  100.0  PLT 181  --   --  172     Basic Metabolic Panel: Recent Labs  Lab 11/27/20 1033 11/27/20 1039 11/27/20 1632 11/28/20 0747  NA 136 137 136 135  K 4.7 4.7 3.6 3.5  CL 104 105  --  102  CO2 22  --   --  26  GLUCOSE 99 98  --  98  BUN 8 8  --  6*  CREATININE 0.76 0.50  --  0.74  CALCIUM 9.7  --   --  8.9     GFR: Estimated Creatinine Clearance: 38.7 mL/min (by C-G formula based on SCr of 0.74 mg/dL).  Liver Function Tests: Recent Labs  Lab 11/27/20 1033  AST 26  ALT 9  ALKPHOS 85  BILITOT 1.7*  PROT 6.4*  ALBUMIN 3.7    No results for input(s): LIPASE, AMYLASE in the last 168 hours. Recent Labs  Lab 11/27/20 1226  AMMONIA 26     Coagulation Profile: Recent Labs  Lab 11/27/20 1033  INR 1.2     Cardiac Enzymes: No results for input(s): CKTOTAL, CKMB, CKMBINDEX, TROPONINI in the last 168 hours.  BNP (last 3 results) No results for input(s): PROBNP in the last 8760 hours.  Lipid  Profile: No results for input(s): CHOL, HDL, LDLCALC, TRIG, CHOLHDL, LDLDIRECT in the last 72 hours.  Thyroid Function Tests: Recent Labs    11/27/20 1226  TSH 4.119     Anemia Panel: No results for input(s): VITAMINB12, FOLATE, FERRITIN, TIBC, IRON, RETICCTPCT in the last 72 hours.  Urine analysis:    Component Value Date/Time   COLORURINE STRAW (A) 11/27/2020 1226   APPEARANCEUR CLEAR 11/27/2020 1226   LABSPEC 1.018 11/27/2020 1226   PHURINE 8.0  11/27/2020 1226   GLUCOSEU NEGATIVE 11/27/2020 1226   HGBUR NEGATIVE 11/27/2020 1226   BILIRUBINUR NEGATIVE 11/27/2020 1226   KETONESUR NEGATIVE 11/27/2020 1226   PROTEINUR NEGATIVE 11/27/2020 1226   NITRITE NEGATIVE 11/27/2020 1226   LEUKOCYTESUR NEGATIVE 11/27/2020 1226    Sepsis Labs: Lactic Acid, Venous    Component Value Date/Time   LATICACIDVEN 0.9 11/27/2020 1331    MICROBIOLOGY: Recent Results (from the past 240 hour(s))  Blood culture (routine x 2)     Status: None (Preliminary result)   Collection Time: 11/27/20  4:05 PM   Specimen: BLOOD RIGHT ARM  Result Value Ref Range Status   Specimen Description BLOOD RIGHT ARM  Final   Special Requests   Final    BOTTLES DRAWN AEROBIC AND ANAEROBIC Blood Culture adequate volume   Culture   Final    NO GROWTH < 24 HOURS Performed at [facility], [address], [place], [place] [address]    Report Status PENDING  Incomplete  Resp Panel by RT-PCR (Flu A&B, Covid) Nasopharyngeal Swab     Status: None   Collection Time: 11/27/20  6:30 PM   Specimen: Nasopharyngeal Swab; Nasopharyngeal(NP) swabs in vial transport medium  Result Value Ref Range Status   SARS Coronavirus 2 by RT PCR NEGATIVE NEGATIVE Final    Comment: (NOTE) SARS-CoV-2 target nucleic acids are NOT DETECTED.  The SARS-CoV-2 RNA is generally detectable in upper respiratory specimens during the acute phase of infection. The lowest concentration of SARS-CoV-2 viral copies this assay can detect  is 138 copies/mL. A negative result does not preclude SARS-Cov-2 infection and should not be used as the sole basis for treatment or other patient management decisions. A negative result may occur with  improper specimen collection/handling, submission of specimen other than nasopharyngeal swab, presence of viral mutation(s) within the areas targeted by this assay, and inadequate number of viral copies(<138 copies/mL). A negative result must be combined with clinical observations, patient history, and epidemiological information. The expected result is Negative.  Fact Sheet for Patients:  BloggerCourse.com  Fact Sheet for Healthcare Providers:  SeriousBroker.it  This test is no t yet approved or cleared by the [place] FDA and  has been authorized for detection and/or diagnosis of SARS-CoV-2 by FDA under an Emergency Use Authorization (EUA). This EUA will remain  in effect (meaning this test can be used) for the duration of the COVID-19 declaration under Section 564(b)(1) of the Act, 21 U.S.C.section 360bbb-3(b)(1), unless the authorization is terminated  or revoked sooner.       Influenza A by PCR NEGATIVE NEGATIVE Final   Influenza B by PCR NEGATIVE NEGATIVE Final    Comment: (NOTE) The Xpert Xpress SARS-CoV-2/FLU/RSV plus assay is intended as an aid in the diagnosis of influenza from Nasopharyngeal swab specimens and should not be used as a sole basis for treatment. Nasal washings and aspirates are unacceptable for Xpert Xpress SARS-CoV-2/FLU/RSV testing.  Fact Sheet for Patients: BloggerCourse.com  Fact Sheet for Healthcare Providers: SeriousBroker.it  This test is not yet approved or cleared by the [place] FDA and has been authorized for detection and/or diagnosis of SARS-CoV-2 by FDA under an Emergency Use Authorization (EUA). This EUA will remain in effect  (meaning this test can be used) for the duration of the COVID-19 declaration under Section 564(b)(1) of the Act, 21 U.S.C. section 360bbb-3(b)(1), unless the authorization is terminated or revoked.  Performed at [facility], [address], [place], [place] [address]  RADIOLOGY STUDIES/RESULTS: DG Chest Portable 1 View  Result Date: 11/27/2020 CLINICAL DATA:  r/o PNA EXAM: PORTABLE CHEST 1 VIEW COMPARISON:  None. FINDINGS: Left basilar opacities. No visible pleural effusions or pneumothorax. Limited visualization of the left lung apex due to overlapping neck/head. Mild enlargement of the cardiac silhouette. Severe left glenohumeral degenerative change with probably posttraumatic deformity of the humeral neck. Osteopenia. IMPRESSION: Left basilar opacities my which could represent atelectasis, aspiration, and/or pneumonia. Electronically Signed   By: Feliberto Harts M.D.   On: 11/27/2020 13:52   CT HEAD CODE STROKE WO CONTRAST  Result Date: 11/27/2020 CLINICAL DATA:  Code stroke.  Neuro deficit, acute, stroke suspected EXAM: CT HEAD WITHOUT CONTRAST TECHNIQUE: Contiguous axial images were obtained from the base of the skull through the vertex without intravenous contrast. COMPARISON:  None. FINDINGS: Brain: There is no acute intracranial hemorrhage, mass effect, or edema. Gray-white differentiation is preserved. There is no extra-axial fluid collection. Patchy and confluent areas of hypoattenuation in the supratentorial white matter are nonspecific but probably reflect moderate chronic microvascular ischemic changes. Prominence of the ventricles and sulci reflects parenchymal volume loss. Vascular: No hyperdense vessel.There is atherosclerotic calcification at the skull base. Skull: Calvarium is unremarkable. Sinuses/Orbits: No acute finding. Other: None. ASPECTS ([place] Stroke Program Early CT Score) - Ganglionic level infarction (caudate, lentiform nuclei, internal capsule, insula,  M1-M3 cortex): 7 - Supraganglionic infarction (M4-M6 cortex): 3 Total score (0-10 with 10 being normal): 10 IMPRESSION: There is no acute intracranial hemorrhage or evidence of acute infarction. ASPECT score is 10. Moderate chronic microvascular ischemic changes. Superimposed acute small vessel infarct is difficult to exclude. These results were communicated to Dr. Thomasena Edis at 10:42 am on 11/27/2020 by text page via the [facility] messaging system. Electronically Signed   By: Guadlupe Spanish M.D.   On: 11/27/2020 10:44   CT ANGIO HEAD NECK W WO CM (CODE STROKE)  Result Date: 11/27/2020 CLINICAL DATA:  Stroke, follow up EXAM: CT ANGIOGRAPHY HEAD AND NECK TECHNIQUE: Multidetector CT imaging of the head and neck was performed using the standard protocol during bolus administration of intravenous contrast. Multiplanar CT image reconstructions and MIPs were obtained to evaluate the vascular anatomy. Carotid stenosis measurements (when applicable) are obtained utilizing NASCET criteria, using the distal internal carotid diameter as the denominator. CONTRAST:  54mL OMNIPAQUE IOHEXOL 350 MG/ML SOLN COMPARISON:  None. FINDINGS: CTA NECK Aortic arch: Mixed plaque along the arch. Great vessel origins are patent. No high-grade stenosis of the proximal subclavian arteries. Right carotid system: Patent. Mild primarily calcified plaque along the proximal internal carotid with minimal stenosis. Left carotid system: Patent. Mild primarily calcified plaque along the proximal internal carotid with minimal stenosis. Vertebral arteries: Patent. Calcified plaque at the left vertebral origin without high-grade stenosis. Skeleton: Degenerative changes of the cervical spine. Other neck: Unremarkable. Upper chest: No apical lung mass. Review of the MIP images confirms the above findings CTA HEAD Anterior circulation: Intracranial internal carotid arteries are patent with calcified plaque but no significant stenosis. Anterior and middle  cerebral arteries are patent. Anterior communicating artery is present. Posterior circulation: Intracranial vertebral arteries are patent. Basilar artery is patent. Major cerebellar artery origins are patent. Posterior cerebral arteries are patent. Basilar artery terminates as the right PCA. The left PCA has a fetal origin. Venous sinuses: Patent as allowed by contrast bolus timing. Review of the MIP images confirms the above findings IMPRESSION: No large vessel occlusion, hemodynamically significant stenosis, or evidence of dissection. These results were communicated to Dr. Thomasena Edis  At 10:57 am on 11/27/2020 by text page via the [facility] messaging system. Electronically Signed   By: Guadlupe Spanish M.D.   On: 11/27/2020 11:07     LOS: 0 days   Jeoffrey Massed, MD  [facility]    To contact the attending provider between 7A-7P or the covering provider during after hours 7P-7A, please log into the web site [email or web address] and access using universal [place] password for that web site. If you do not have the password, please call the hospital operator.  11/28/2020, 12:29 PM

## 2020-11-28 NOTE — Evaluation (Signed)
Occupational Therapy Evaluation Patient Details Name: Charlotte Powell MRN: [id number] DOB: Dec 18, 1928 Today's Date: 11/28/2020   History of Present Illness 87 y.o. F Admitted on 10/11 due to aphasia and confusion, CT negative. Prior medical history significant of paroxysmal A. fib, history of hypertension and hyperlipidemia, and hypothyroidism.   Clinical Impression   Pt admitted for concerns listed above. PTA pt's family reported that she was independent with most ADL's except dressing due to difficulties with her L shoulder and bending down to her feet. At this time, pt is demonstrating difficulty maintaining upright posture sitting EOB, following simple commands with verbal and tactile cueing, and attending to any tasks. Pt assessment limited due to fatigue and cognition. OT will continue to follow acutely.       Recommendations for follow up therapy are one component of a multi-disciplinary discharge planning process, led by the attending physician.  Recommendations may be updated based on patient status, additional functional criteria and insurance authorization.   Follow Up Recommendations  SNF;Supervision/Assistance - 24 hour    Equipment Recommendations  None recommended by OT    Recommendations for Other Services       Precautions / Restrictions Precautions Precautions: Fall Restrictions Weight Bearing Restrictions: No      Mobility Bed Mobility Overal bed mobility: Needs Assistance Bed Mobility: Supine to Sit;Sit to Supine     Supine to sit: Max assist;HOB elevated Sit to supine: Max assist;+2 for physical assistance;+2 for safety/equipment   General bed mobility comments: Pt requiring max A +1-2 due to increased weakness    Transfers                 General transfer comment: Deferred due to cognition, weakness, and safety    Balance Overall balance assessment: Needs assistance Sitting-balance support: Bilateral upper extremity supported Sitting  balance-Leahy Scale: Poor Sitting balance - Comments: Balance required max A to maintain upright position, unable to complete movements without losing balance. Postural control: Posterior lean;Right lateral lean                                 ADL either performed or assessed with clinical judgement   ADL Overall ADL's : Needs assistance/impaired                                       General ADL Comments: ADL's will need to be further asssessed, as at this time, she is requring max +1-2 for all tasks due to cogntion and weakness.     Vision Baseline Vision/History: 1 Wears glasses Ability to See in Adequate Light: 1 Impaired Patient Visual Report: No change from baseline Vision Assessment?: Vision impaired- to be further tested in functional context Additional Comments: Pt kept her eyes shut or squinted throuhgout the entire session, unsure if pt has difficulty following commands vs visual deficits     Perception Perception Perception Tested?: No   Praxis Praxis Praxis tested?: Not tested    Pertinent Vitals/Pain Pain Assessment: Faces Faces Pain Scale: Hurts even more Pain Location: Back' Pain Descriptors / Indicators: Aching;Discomfort;Grimacing Pain Intervention(s): Limited activity within patient's tolerance;Monitored during session;Repositioned     Hand Dominance Right   Extremity/Trunk Assessment Upper Extremity Assessment Upper Extremity Assessment: Generalized weakness;LUE deficits/detail LUE Deficits / Details: Daughter reports that pt has a "bad" shoulder and is unable to mover her arm more  than bringing her hand to her mouth. LUE Sensation: decreased light touch LUE Coordination: decreased fine motor;decreased gross motor   Lower Extremity Assessment Lower Extremity Assessment: Defer to PT evaluation   Cervical / Trunk Assessment Cervical / Trunk Assessment: Kyphotic   Communication Communication Communication: Expressive  difficulties   Cognition Arousal/Alertness: Awake/alert;Lethargic Behavior During Therapy: WFL for tasks assessed/performed Overall Cognitive Status: Impaired/Different from baseline Area of Impairment: Orientation;Attention;Following commands;Safety/judgement;Awareness;Problem solving                 Orientation Level: Disoriented to;Situation Current Attention Level: Selective   Following Commands: Follows one step commands inconsistently;Follows one step commands with increased time Safety/Judgement: Decreased awareness of safety;Decreased awareness of deficits Awareness: Intellectual Problem Solving: Slow processing;Decreased initiation;Requires verbal cues;Requires tactile cues General Comments: Pt able to sit EOB, once sitting pt had difficulty following any commands, including to sit up. Pt attempting to talk, however speech continues to be garbled and slurred.   General Comments  HR 63, O2 95%    Exercises     Shoulder Instructions      Home Living Family/patient expects to be discharged to:: Skilled nursing facility                                 Additional Comments: Pt from [facility], has a RW      Prior Functioning/Environment Level of Independence: Needs assistance  Gait / Transfers Assistance Needed: She uses a rollator for all ambulation ADL's / Homemaking Assistance Needed: requires assist with donning and doffing socks and shoes, as well as tops due to L shoulder deficits.            OT Problem List: Decreased strength;Decreased range of motion;Decreased activity tolerance;Impaired balance (sitting and/or standing);Decreased coordination;Decreased cognition;Decreased safety awareness;Decreased knowledge of use of DME or AE;Impaired sensation;Impaired UE functional use      OT Treatment/Interventions: Self-care/ADL training;Therapeutic exercise;DME and/or AE instruction;Energy conservation;Therapeutic activities;Patient/family  education;Balance training    OT Goals(Current goals can be found in the care plan section) Acute Rehab OT Goals Patient Stated Goal: None stated OT Goal Formulation: With patient/family Time For Goal Achievement: 12/12/20 Potential to Achieve Goals: Fair ADL Goals Pt Will Perform Grooming: with min guard assist;sitting Pt Will Perform Upper Body Bathing: with min assist;sitting Pt Will Perform Lower Body Bathing: sitting/lateral leans;with mod assist;sit to/from stand Pt Will Perform Upper Body Dressing: with min assist;sitting;standing Pt Will Perform Lower Body Dressing: with mod assist;sitting/lateral leans;sit to/from stand Pt Will Transfer to Toilet: with min assist;stand pivot transfer Pt Will Perform Toileting - Clothing Manipulation and hygiene: with min assist;sitting/lateral leans;sit to/from stand  OT Frequency: Min 2X/week   Barriers to D/C:            Co-evaluation              AM-PAC OT "6 Clicks" Daily Activity     Outcome Measure Help from another person eating meals?: A Lot Help from another person taking care of personal grooming?: A Lot Help from another person toileting, which includes using toliet, bedpan, or urinal?: A Lot Help from another person bathing (including washing, rinsing, drying)?: A Lot Help from another person to put on and taking off regular upper body clothing?: A Lot Help from another person to put on and taking off regular lower body clothing?: A Lot 6 Click Score: 12   End of Session Nurse Communication: Mobility status  Activity Tolerance:  Patient limited by fatigue;Patient limited by lethargy Patient left: in bed;with call bell/phone within reach;with family/visitor present  OT Visit Diagnosis: Unsteadiness on feet (R26.81);Other abnormalities of gait and mobility (R26.89);Muscle weakness (generalized) (M62.81)                Time: 6720-9470 OT Time Calculation (min): 19 min Charges:  OT General Charges $OT Visit: 1  Visit OT Evaluation $OT Eval Moderate Complexity: 1 Mod  Nimsi Males H., OTR/L Acute Rehabilitation  Simaya Lumadue Elane Marcanthony Sleight 11/28/2020, 12:09 PM

## 2020-11-28 NOTE — ED Notes (Signed)
Patient asleep. Family member requests that we not take BP at this time.

## 2020-11-28 NOTE — Evaluation (Signed)
Clinical/Bedside Swallow Evaluation Patient Details  Name: Charlotte Powell MRN: [id number] Date of Birth: Mar 10, 1928  Today's Date: 11/28/2020 Time: SLP Start Time (ACUTE ONLY): 4742 SLP Stop Time (ACUTE ONLY): 0948 SLP Time Calculation (min) (ACUTE ONLY): 22 min  Past Medical History:  Past Medical History:  Diagnosis Date   CAD (coronary artery disease)    Diverticulitis    GERD (gastroesophageal reflux disease)    HLD (hyperlipidemia)    HTN (hypertension)    Hypothyroidism    Past Surgical History:  Past Surgical History:  Procedure Laterality Date   ANKLE FRACTURE SURGERY     CHOLECYSTECTOMY     CORONARY STENT PLACEMENT     FEMUR FRACTURE SURGERY     WRIST FRACTURE SURGERY     HPI:  with medical history significant of paroxysmal A. fib and therapeutic on Eliquis, history of hypertension and hyperlipidemia, hypothyroidism who was recently admitted to hospital with nausea and vomiting and symptomatically treated brought bac    Assessment / Plan / Recommendation  Clinical Impression  Pt presents with confusion, intermittent difficulty following commands, but interactive and pleasant. Her daughter, Charlotte Powell, is at bedside. No hx of dysphagia. Pt participated in her own oral care using toothbrush/paste.  She had limited appetite, but demonstrated no s/sx of dysphagia: there was adequate recognition of POs, thorough mastication, brisk swallow, no s/sx of aspiration.  Recommend continuing current HH diet; thin liquids; meds whole in liquid as tolerated - give whole in puree if she coughs with water/pills.  No further SLP needs are identified. Our service will sign off. SLP Visit Diagnosis: Dysphagia, unspecified (R13.10)    Aspiration Risk  No limitations    Diet Recommendation   HH diet, thin liquids  Medication Administration: Whole meds with liquid    Other  Recommendations Oral Care Recommendations: Oral care BID    Recommendations for follow up therapy are one component  of a multi-disciplinary discharge planning process, led by the attending physician.  Recommendations may be updated based on patient status, additional functional criteria and insurance authorization.  Follow up Recommendations None        Swallow Study   General Date of Onset: 11/27/20 HPI: with medical history significant of paroxysmal A. fib and therapeutic on Eliquis, history of hypertension and hyperlipidemia, hypothyroidism who was recently admitted to hospital with nausea and vomiting and symptomatically treated brought bac Type of Study: Bedside Swallow Evaluation Previous Swallow Assessment: no Diet Prior to this Study: Regular;Thin liquids Temperature Spikes Noted: Yes Respiratory Status: Nasal cannula History of Recent Intubation: No Behavior/Cognition: Alert;Confused Oral Cavity Assessment: Within Functional Limits Oral Care Completed by SLP: Yes Oral Cavity - Dentition: Adequate natural dentition;Missing dentition Vision: Functional for self-feeding Self-Feeding Abilities: Needs assist Patient Positioning: Upright in bed Baseline Vocal Quality: Normal Volitional Cough: Strong Volitional Swallow: Unable to elicit    Oral/Motor/Sensory Function Overall Oral Motor/Sensory Function: Within functional limits   Ice Chips Ice chips: Not tested   Thin Liquid Thin Liquid: Within functional limits    Nectar Thick Nectar Thick Liquid: Not tested   Honey Thick Honey Thick Liquid: Not tested   Puree Puree: Within functional limits   Solid     Solid: Within functional limits      Charlotte Powell Laurice 11/28/2020,9:58 AM  .[place] Faster

## 2020-11-28 NOTE — Evaluation (Signed)
Physical Therapy Evaluation Patient Details Name: Charlotte Powell MRN: [id number] DOB: [birth year] Today's Date: 11/28/2020  History of Present Illness  87 y.o. F Admitted on 10/11 due to aphasia and confusion, CT negative. Prior medical history significant of paroxysmal A. fib, history of hypertension and hyperlipidemia, and hypothyroidism.  Clinical Impression  Patient presents with decreased mobility due to general weakness, decreased cognition (though seems improved this pm), decreased balance and decreased activity tolerance with continued high fall risk.  She will benefit from skilled PT in the acute setting prior to return to SNF level rehab at d/c.  Seemed coherent with speech this pm and able to follow commands well and mobilize with much less assist than earlier with OT.         Recommendations for follow up therapy are one component of a multi-disciplinary discharge planning process, led by the attending physician.  Recommendations may be updated based on patient status, additional functional criteria and insurance authorization.  Follow Up Recommendations SNF    Equipment Recommendations  None recommended by PT    Recommendations for Other Services       Precautions / Restrictions Precautions Precautions: Fall      Mobility  Bed Mobility Overal bed mobility: Needs Assistance Bed Mobility: Supine to Sit;Sit to Supine     Supine to sit: Min assist Sit to supine: Min assist   General bed mobility comments: able to sit up with assist for scooting, to supine assist for positioning    Transfers Overall transfer level: Needs assistance Equipment used: Rolling walker (2 wheeled) Transfers: Sit to/from Stand Sit to Stand: Min assist;Mod assist         General transfer comment: performed x 3 for changing bed pad and for perineal hygiene when not using right technique (pushing from bed) needs some lifting help  Ambulation/Gait Ambulation/Gait assistance: Min  assist Gait Distance (Feet): 15 Feet Assistive device: Rolling walker (2 wheeled) Gait Pattern/deviations: Step-to pattern;Step-through pattern;Decreased stride length     General Gait Details: assist for balance/steadying due to general weakness  Stairs            Wheelchair Mobility    Modified Rankin (Stroke Patients Only)       Balance Overall balance assessment: Needs assistance Sitting-balance support: Feet supported Sitting balance-Leahy Scale: Good Sitting balance - Comments: performing LE strength testing in sitting   Standing balance support: Bilateral upper extremity supported Standing balance-Leahy Scale: Poor Standing balance comment: UE support for balance and minguad A                             Pertinent Vitals/Pain Pain Assessment: Faces Faces Pain Scale: Hurts a little bit Pain Location: L hip Pain Descriptors / Indicators: Tender Pain Intervention(s): Monitored during session;Repositioned    Home Living Family/patient expects to be discharged to:: Skilled nursing facility                 Additional Comments: Pt from [facility], has a RW    Prior Function Level of Independence: Needs assistance   Gait / Transfers Assistance Needed: She uses a rollator for all ambulation  ADL's / Homemaking Assistance Needed: requires assist with donning and doffing socks and shoes, as well as tops due to L shoulder deficits.        Hand Dominance   Dominant Hand: Right    Extremity/Trunk Assessment   Upper Extremity Assessment Upper Extremity Assessment: Defer to OT evaluation  Lower Extremity Assessment Lower Extremity Assessment: Generalized weakness    Cervical / Trunk Assessment Cervical / Trunk Assessment: Kyphotic  Communication   Communication: No difficulties  Cognition Arousal/Alertness: Awake/alert Behavior During Therapy: WFL for tasks assessed/performed Overall Cognitive Status: No family/caregiver  present to determine baseline cognitive functioning Area of Impairment: Orientation;Attention;Memory;Following commands                 Orientation Level: Disoriented to;Situation Current Attention Level: Selective Memory: Decreased short-term memory Following Commands: Follows one step commands with increased time (and multimodal cues) Safety/Judgement: Decreased awareness of deficits   Problem Solving: Slow processing;Difficulty sequencing;Requires verbal cues        General Comments General comments (skin integrity, edema, etc.): on RA throughout with VSS; daughter no longer in the room, pt reports went home for a bit; soiled pure wick so assist for hygiene and changed mesh briefs and pure wick    Exercises     Assessment/Plan    PT Assessment Patient needs continued PT services  PT Problem List Decreased strength;Decreased mobility;Decreased activity tolerance;Decreased balance;Decreased knowledge of use of DME;Decreased cognition;Decreased safety awareness;Decreased knowledge of precautions       PT Treatment Interventions DME instruction;Therapeutic activities;Gait training;Therapeutic exercise;Patient/family education;Balance training;Functional mobility training;Cognitive remediation    PT Goals (Current goals can be found in the Care Plan section)  Acute Rehab PT Goals Patient Stated Goal: to return to rehab PT Goal Formulation: With patient Time For Goal Achievement: 12/12/20 Potential to Achieve Goals: Fair    Frequency Min 2X/week   Barriers to discharge        Co-evaluation               AM-PAC PT "6 Clicks" Mobility  Outcome Measure Help needed turning from your back to your side while in a flat bed without using bedrails?: A Little Help needed moving from lying on your back to sitting on the side of a flat bed without using bedrails?: A Little Help needed moving to and from a bed to a chair (including a wheelchair)?: A Little Help needed  standing up from a chair using your arms (e.g., wheelchair or bedside chair)?: A Little Help needed to walk in hospital room?: A Little Help needed climbing 3-5 steps with a railing? : Total 6 Click Score: 16    End of Session   Activity Tolerance: Patient limited by fatigue Patient left: in bed;with call bell/phone within reach;with bed alarm set   PT Visit Diagnosis: Other abnormalities of gait and mobility (R26.89);History of falling (Z91.81);Muscle weakness (generalized) (M62.81)    Time: 1443-1500 PT Time Calculation (min) (ACUTE ONLY): 17 min   Charges:   PT Evaluation $PT Eval Moderate Complexity: 1 Mod          Cyndi Jentry Warnell, PT Acute Rehabilitation Services Pager:[phone] [phone] 11/28/2020   Elray Mcgregor 11/28/2020, 3:09 PM

## 2020-11-29 DIAGNOSIS — E785 Hyperlipidemia, unspecified: Secondary | ICD-10-CM

## 2020-11-29 LAB — GLUCOSE, CAPILLARY: Glucose-Capillary: 79 mg/dL (ref 70–99)

## 2020-11-29 NOTE — TOC Initial Note (Signed)
Transition of Care [facility]) - Initial/Assessment Note    Patient Details  Name: Charlotte Powell MRN: [id number] Date of Birth: Nov 13, 1928  [facility] CM/SW Contact:    Geralynn Ochs, LCSW Phone Number: 11/29/2020, 2:21 PM  Clinical Narrative:          CSW met with patient and daughter at bedside to discuss discharge plan. Patient and daughter interested in possibly returning to [facility] if possible, need to discuss care levels with them. If not, then agreeable to SNF placement, either locally or back where the patient is from in [facility]. CSW sent therapy notes to [facility] for review, and they recommend SNF prior to returning to ALF. CSW updated daughter at bedside and she indicated understanding. CSW discussed transportation concern with regard to sending the patient out to [facility], and daughter indicated understanding of that, as well. CSW to send out SNF referral and will follow up with bed offers.          Expected Discharge Plan: Skilled Nursing Facility Barriers to Discharge: Continued Medical Work up, Ship broker   Patient Goals and CMS Choice Patient states their goals for this hospitalization and ongoing recovery are:: patient unable to participate in goal setting, only oriented to self CMS Medicare.gov Compare Post Acute Care list provided to:: Patient Represenative (must comment) Choice offered to / list presented to : Adult Children  Expected Discharge Plan and Services Expected Discharge Plan: [place] Choice: [place] Living arrangements for the past 2 months: [place]                                      Prior Living Arrangements/Services Living arrangements for the past 2 months: [place] Lives with:: Facility Resident Patient language and need for interpreter reviewed:: No Do you feel safe going back to the place where  you live?: Yes      Need for Family Participation in Patient Care: Yes (Comment) Care giver support system in place?: No (comment)   Criminal Activity/Legal Involvement Pertinent to Current Situation/Hospitalization: No - Comment as needed  Activities of Daily Living      Permission Sought/Granted Permission sought to share information with : Facility Sport and exercise psychologist, Family Supports Permission granted to share information with : Yes, Verbal Permission Granted  Share Information with NAME: [name]  Permission granted to share info w AGENCY: SNF  Permission granted to share info w Relationship: Daughter     Emotional Assessment   Attitude/Demeanor/Rapport: Unable to Assess Affect (typically observed): Unable to Assess Orientation: : Oriented to Self Alcohol / Substance Use: Not Applicable Psych Involvement: No (comment)  Admission diagnosis:  Encephalopathy [G93.40] Encephalopathy acute [G93.40] Patient Active Problem List   Diagnosis Date Noted   Encephalopathy acute 11/28/2020   Encephalopathy 11/27/2020   Community acquired pneumonia 11/27/2020   Gastroenteritis 11/16/2020   Nausea & vomiting 11/16/2020   A-fib ([place]) 11/16/2020   PCP:  System, Provider Not In Pharmacy:   [facility] [address] [place]. [place] [address] Phone: [phone] Fax: [phone]     Social Determinants of Health (SDOH) Interventions    Readmission Risk Interventions No flowsheet data found.

## 2020-11-29 NOTE — Progress Notes (Addendum)
PROGRESS NOTE        PATIENT DETAILS Name: Charlotte Powell Age: 87 y.o. Sex: female Date of Birth: [birth year] Admit Date: 11/27/2020 Admitting Physician Dewayne Shorter Levora Dredge, MD SEG:BTDVVO, Provider Not In  Brief Narrative: Patient is a 87 y.o. female with history of PAF, HTN, HLD, hypothyroidism-resident of a local SNF-presented with confusion-thought to have acute metabolic encephalopathy in the setting of PNA and admitted to the hospitalist service.  Subjective: Much more awake and alert compared to yesterday-interacting much more today.  Patient acknowledges today that she is having some difficulty with some amount of expressive aphasia.  Objective: Vitals: Blood pressure (!) 111/58, pulse 78, temperature 97.9 F (36.6 C), temperature source Oral, resp. rate 18, height 5\' 3"  (1.6 m), weight 57.2 kg, SpO2 94 %.   Exam: Gen Exam:Alert awake-not in any distress HEENT:atraumatic, normocephalic Chest: B/L clear to auscultation anteriorly CVS:S1S2 regular Abdomen:soft non tender, non distended Extremities:no edema Neurology: Non focal Skin: no rash   Pertinent Labs/Radiology:  10/11>>Blood culture: No growth 10/12>> blood culture: No growth  10/11>>CXR: Left basilar opacities 10/11>> CT head: No acute infarction/ICH. 10/11>> CT angio head/neck: No hemodynamically significant stenosis.  Assessment/Plan: Acute metabolic encephalopathy: Likely due to PNA-improved with IVF/IV antibiotics-she is much more awake and alert.    Mild expressive aphasia-?  CVA not seen on CT head: Although her encephalopathy is significantly improved-she does acknowledge some difficulty finding words-her speech is otherwise fluent.  Long discussion with patient/daughter at bedside-regarding possibility that she may have had a small CVA (no other focal deficits)-and perhaps pursuing a MRI of the brain for a definite diagnosis.  However patient has issues with  claustrophobia-family does not desire aggressive care-she is already on Eliquis-given the fact that obtaining a MRI of the brain we will not change outcome/treatment-after discussion with family-we have elected not to pursue any further work-up.  Left lobar pneumonia: Likely aspiration-continue Rocephin/Zithromax-given advanced age/frailty-suspect requires another few more days of hospitalization before consideration of discharge.  Appreciate SLP evaluation.  PAF: Continue Eliquis-not on any rate control agents in the outpatient setting.  Hypothyroidism: Continue Synthroid-TSH stable.  HLD: Continue statin  Debility/deconditioning: Appreciate PT/OT eval-recommendations are for SNF.  Discussed with .    Procedures: None Consults: None DVT Prophylaxis: Eliquis Code Status:DNR Family Communication: Daughter at bedside  Time spent: 41 minutes-Greater than 50% of this time was spent in counseling, explanation of diagnosis, planning of further management, and coordination of care.  Diet: Diet Order             Diet Heart Room service appropriate? Yes with Assist; Fluid consistency: Thin  Diet effective now                      Disposition Plan: Status is: Observation  The patient will require care spanning > 2 midnights and should be moved to inpatient because: Inpatient level of care appropriate due to severity of illness  Dispo: The patient is from: ALF              Anticipated d/c is to: ALF              Patient currently is not medically stable to d/c.   Difficult to place patient No     Barriers to Discharge: Resolving encephalopathy-on IV antibiotics for pneumonia-although improved-not yet at baseline.  Antimicrobial agents: Anti-infectives (From admission, onward)    Start     Dose/Rate Route Frequency Ordered Stop   11/27/20 1945  cefTRIAXone (ROCEPHIN) 2 g in sodium chloride 0.9 % 100 mL IVPB        2 g 200 mL/hr over 30 Minutes Intravenous  Every 24 hours 11/27/20 1934 12/02/20 1959   11/27/20 1945  azithromycin (ZITHROMAX) 500 mg in sodium chloride 0.9 % 250 mL IVPB        500 mg 250 mL/hr over 60 Minutes Intravenous Every 24 hours 11/27/20 1934 12/02/20 1759   11/27/20 1400  cefTRIAXone (ROCEPHIN) 2 g in sodium chloride 0.9 % 100 mL IVPB        2 g 200 mL/hr over 30 Minutes Intravenous  Once 11/27/20 1357 11/27/20 1715   11/27/20 1400  azithromycin (ZITHROMAX) 500 mg in sodium chloride 0.9 % 250 mL IVPB        500 mg 250 mL/hr over 60 Minutes Intravenous  Once 11/27/20 1357 11/27/20 1830        MEDICATIONS: Scheduled Meds:  acetaminophen  1,000 mg Oral QHS   apixaban  2.5 mg Oral BID   cholecalciferol  1,000 Units Oral Daily   docusate sodium  100 mg Oral QODAY   famotidine  20 mg Oral Daily   folic acid  1,000 mcg Oral Daily   gabapentin  100 mg Oral QHS   levothyroxine  25 mcg Oral QAC breakfast   pravastatin  80 mg Oral Daily   sodium chloride flush  10-40 mL Intracatheter Q12H   sodium chloride flush  3 mL Intravenous Once   Continuous Infusions:  azithromycin 500 mg (11/28/20 1712)   cefTRIAXone (ROCEPHIN)  IV 2 g (11/28/20 2207)   PRN Meds:.acetaminophen, fluticasone, nitroGLYCERIN, sodium chloride flush   I have personally reviewed following labs and imaging studies  LABORATORY DATA: CBC: Recent Labs  Lab 11/27/20 1033 11/27/20 1039 11/27/20 1632 11/28/20 0747  WBC 8.2  --   --  5.7  NEUTROABS 2.4  --   --   --   HGB 14.6 13.9 13.6 12.9  HCT 43.7 41.0 40.0 37.9  MCV 102.3*  --   --  100.0  PLT 181  --   --  172     Basic Metabolic Panel: Recent Labs  Lab 11/27/20 1033 11/27/20 1039 11/27/20 1632 11/28/20 0747  NA 136 137 136 135  K 4.7 4.7 3.6 3.5  CL 104 105  --  102  CO2 22  --   --  26  GLUCOSE 99 98  --  98  BUN 8 8  --  6*  CREATININE 0.76 0.50  --  0.74  CALCIUM 9.7  --   --  8.9     GFR: Estimated Creatinine Clearance: 38.7 mL/min (by C-G formula based on SCr of  0.74 mg/dL).  Liver Function Tests: Recent Labs  Lab 11/27/20 1033  AST 26  ALT 9  ALKPHOS 85  BILITOT 1.7*  PROT 6.4*  ALBUMIN 3.7    No results for input(s): LIPASE, AMYLASE in the last 168 hours. Recent Labs  Lab 11/27/20 1226  AMMONIA 26     Coagulation Profile: Recent Labs  Lab 11/27/20 1033  INR 1.2     Cardiac Enzymes: No results for input(s): CKTOTAL, CKMB, CKMBINDEX, TROPONINI in the last 168 hours.  BNP (last 3 results) No results for input(s): PROBNP in the last 8760 hours.  Lipid Profile: No results for input(s): CHOL, HDL,  LDLCALC, TRIG, CHOLHDL, LDLDIRECT in the last 72 hours.  Thyroid Function Tests: Recent Labs    11/27/20 1226  TSH 4.119     Anemia Panel: No results for input(s): VITAMINB12, FOLATE, FERRITIN, TIBC, IRON, RETICCTPCT in the last 72 hours.  Urine analysis:    Component Value Date/Time   COLORURINE STRAW (A) 11/27/2020 1226   APPEARANCEUR CLEAR 11/27/2020 1226   LABSPEC 1.018 11/27/2020 1226   PHURINE 8.0 11/27/2020 1226   GLUCOSEU NEGATIVE 11/27/2020 1226   HGBUR NEGATIVE 11/27/2020 1226   BILIRUBINUR NEGATIVE 11/27/2020 1226   KETONESUR NEGATIVE 11/27/2020 1226   PROTEINUR NEGATIVE 11/27/2020 1226   NITRITE NEGATIVE 11/27/2020 1226   LEUKOCYTESUR NEGATIVE 11/27/2020 1226    Sepsis Labs: Lactic Acid, Venous    Component Value Date/Time   LATICACIDVEN 0.9 11/27/2020 1331    MICROBIOLOGY: Recent Results (from the past 240 hour(s))  Blood culture (routine x 2)     Status: None (Preliminary result)   Collection Time: 11/27/20  4:05 PM   Specimen: BLOOD RIGHT ARM  Result Value Ref Range Status   Specimen Description BLOOD RIGHT ARM  Final   Special Requests   Final    BOTTLES DRAWN AEROBIC AND ANAEROBIC Blood Culture adequate volume   Culture   Final    NO GROWTH 2 DAYS Performed at [facility], [address], [place], [place] [address]    Report Status PENDING  Incomplete  Resp Panel by RT-PCR  (Flu A&B, Covid) Nasopharyngeal Swab     Status: None   Collection Time: 11/27/20  6:30 PM   Specimen: Nasopharyngeal Swab; Nasopharyngeal(NP) swabs in vial transport medium  Result Value Ref Range Status   SARS Coronavirus 2 by RT PCR NEGATIVE NEGATIVE Final    Comment: (NOTE) SARS-CoV-2 target nucleic acids are NOT DETECTED.  The SARS-CoV-2 RNA is generally detectable in upper respiratory specimens during the acute phase of infection. The lowest concentration of SARS-CoV-2 viral copies this assay can detect is 138 copies/mL. A negative result does not preclude SARS-Cov-2 infection and should not be used as the sole basis for treatment or other patient management decisions. A negative result may occur with  improper specimen collection/handling, submission of specimen other than nasopharyngeal swab, presence of viral mutation(s) within the areas targeted by this assay, and inadequate number of viral copies(<138 copies/mL). A negative result must be combined with clinical observations, patient history, and epidemiological information. The expected result is Negative.  Fact Sheet for Patients:  BloggerCourse.com  Fact Sheet for Healthcare Providers:  SeriousBroker.it  This test is no t yet approved or cleared by the [place] FDA and  has been authorized for detection and/or diagnosis of SARS-CoV-2 by FDA under an Emergency Use Authorization (EUA). This EUA will remain  in effect (meaning this test can be used) for the duration of the COVID-19 declaration under Section 564(b)(1) of the Act, 21 U.S.C.section 360bbb-3(b)(1), unless the authorization is terminated  or revoked sooner.       Influenza A by PCR NEGATIVE NEGATIVE Final   Influenza B by PCR NEGATIVE NEGATIVE Final    Comment: (NOTE) The Xpert Xpress SARS-CoV-2/FLU/RSV plus assay is intended as an aid in the diagnosis of influenza from Nasopharyngeal swab specimens  and should not be used as a sole basis for treatment. Nasal washings and aspirates are unacceptable for Xpert Xpress SARS-CoV-2/FLU/RSV testing.  Fact Sheet for Patients: BloggerCourse.com  Fact Sheet for Healthcare Providers: SeriousBroker.it  This test is not yet approved or cleared  by the [place] and has been authorized for detection and/or diagnosis of SARS-CoV-2 by FDA under an Emergency Use Authorization (EUA). This EUA will remain in effect (meaning this test can be used) for the duration of the COVID-19 declaration under Section 564(b)(1) of the Act, 21 U.S.C. section 360bbb-3(b)(1), unless the authorization is terminated or revoked.  Performed at [facility], [address] [place], [place] [address]   Blood culture (routine x 2)     Status: None (Preliminary result)   Collection Time: 11/28/20  3:55 AM   Specimen: BLOOD  Result Value Ref Range Status   Specimen Description BLOOD RIGHT ANTECUBITAL  Final   Special Requests AEROBIC BOTTLE ONLY Blood Culture adequate volume  Final   Culture   Final    NO GROWTH 1 DAY Performed at [facility], [address] [place], [place] [address]    Report Status PENDING  Incomplete    RADIOLOGY STUDIES/RESULTS: DG Chest Portable 1 View  Result Date: 11/27/2020 CLINICAL DATA:  r/o PNA EXAM: PORTABLE CHEST 1 VIEW COMPARISON:  None. FINDINGS: Left basilar opacities. No visible pleural effusions or pneumothorax. Limited visualization of the left lung apex due to overlapping neck/head. Mild enlargement of the cardiac silhouette. Severe left glenohumeral degenerative change with probably posttraumatic deformity of the humeral neck. Osteopenia. IMPRESSION: Left basilar opacities my which could represent atelectasis, aspiration, and/or pneumonia. Electronically Signed   By: Feliberto Harts M.D.   On: 11/27/2020 13:52     LOS: 1 day   Jeoffrey Massed, MD  [facility]    To contact the attending provider between 7A-7P or the covering provider during after hours 7P-7A, please log into the web site [email or web address] and access using universal [place] password for that web site. If you do not have the password, please call the hospital operator.  11/29/2020, 11:55 AM

## 2020-11-29 NOTE — NC FL2 (Signed)
[place] MEDICAID FL2 LEVEL OF CARE SCREENING TOOL     IDENTIFICATION  Patient Name: Charlotte Powell Birthdate: [birth year] Sex: female Admission Date (Current Location): 11/27/2020  [facility] and [place] Number:  Producer, television/film/video and Address:  [facility] [place], [address], [place], [place] [address]      Provider Number: [id number]  Attending Physician Name and Address:  Maretta Bees, MD  Relative Name and Phone Number:       Current Level of Care: Hospital Recommended Level of Care: Skilled Nursing Facility Prior Approval Number:    Date Approved/Denied:   PASRR Number:    Discharge Plan: SNF    Current Diagnoses: Patient Active Problem List   Diagnosis Date Noted   Encephalopathy acute 11/28/2020   Encephalopathy 11/27/2020   Community acquired pneumonia 11/27/2020   Gastroenteritis 11/16/2020   Nausea & vomiting 11/16/2020   A-fib (HCC) 11/16/2020    Orientation RESPIRATION BLADDER Height & Weight     Self  Normal Incontinent Weight: 126 lb 1.7 oz (57.2 kg) Height:  5\' 3"  (160 cm)  BEHAVIORAL SYMPTOMS/MOOD NEUROLOGICAL BOWEL NUTRITION STATUS      Continent Diet (heart healthy)  AMBULATORY STATUS COMMUNICATION OF NEEDS Skin   Limited Assist Verbally Normal                       Personal Care Assistance Level of Assistance  Bathing, Feeding, Dressing Bathing Assistance: Limited assistance Feeding assistance: Independent Dressing Assistance: Limited assistance     Functional Limitations Info  Sight Sight Info: Impaired        SPECIAL CARE FACTORS FREQUENCY  PT (By licensed PT), OT (By licensed OT)     PT Frequency: 5x/wk OT Frequency: 5x/wk            Contractures Contractures Info: Not present    Additional Factors Info  Code Status, Allergies Code Status Info: DNR Allergies Info: Amoxicillin, Hydrocodone           Current Medications (11/29/2020):  This is the current hospital active  medication list Current Facility-Administered Medications  Medication Dose Route Frequency Provider Last Rate Last Admin   acetaminophen (TYLENOL) tablet 1,000 mg  1,000 mg Oral QHS 12/01/2020, MD   1,000 mg at 11/28/20 2204   acetaminophen (TYLENOL) tablet 650 mg  650 mg Oral Q8H PRN 2205, MD       apixaban Dorcas Carrow) tablet 2.5 mg  2.5 mg Oral BID Everlene Balls, MD   2.5 mg at 11/29/20 1018   cefTRIAXone (ROCEPHIN) 2 g in sodium chloride 0.9 % 100 mL IVPB  2 g Intravenous Q24H 12/01/20, MD 200 mL/hr at 11/28/20 2207 2 g at 11/28/20 2207   cholecalciferol (VITAMIN D3) tablet 1,000 Units  1,000 Units Oral Daily 2208, MD   1,000 Units at 11/29/20 1018   docusate sodium (COLACE) capsule 100 mg  100 mg Oral 12/01/20, MD   100 mg at 11/29/20 1018   famotidine (PEPCID) tablet 20 mg  20 mg Oral Daily 12/01/20, MD   20 mg at 11/29/20 1018   fluticasone (FLONASE) 50 MCG/ACT nasal spray 1 spray  1 spray Each Nare BID PRN 12/01/20, MD       folic acid (FOLVITE) tablet 1 mg  1,000 mcg Oral Daily Dorcas Carrow, MD   1 mg at 11/29/20 1018   gabapentin (NEURONTIN) capsule 100 mg  100 mg Oral QHS 12/01/20, MD  100 mg at 11/28/20 2204   levothyroxine (SYNTHROID) tablet 25 mcg  25 mcg Oral QAC breakfast Dorcas Carrow, MD   25 mcg at 11/29/20 0502   nitroGLYCERIN (NITROSTAT) SL tablet 0.4 mg  0.4 mg Sublingual Q5 min PRN Dorcas Carrow, MD       pravastatin (PRAVACHOL) tablet 80 mg  80 mg Oral Daily Dorcas Carrow, MD   80 mg at 11/29/20 1018   sodium chloride flush (NS) 0.9 % injection 10-40 mL  10-40 mL Intracatheter Q12H Dorcas Carrow, MD   10 mL at 11/28/20 2208   sodium chloride flush (NS) 0.9 % injection 10-40 mL  10-40 mL Intracatheter PRN Dorcas Carrow, MD       sodium chloride flush (NS) 0.9 % injection 3 mL  3 mL Intravenous Once Dorcas Carrow, MD         Discharge Medications: Please see discharge summary for a list of discharge  medications.  Relevant Imaging Results:  Relevant Lab Results:   Additional Information SS#: [id number]  Baldemar Lenis, LCSW

## 2020-11-30 ENCOUNTER — Other Ambulatory Visit (HOSPITAL_COMMUNITY): Payer: Self-pay

## 2020-11-30 LAB — GLUCOSE, CAPILLARY: Glucose-Capillary: 88 mg/dL (ref 70–99)

## 2020-11-30 MED ORDER — CEFDINIR 300 MG PO CAPS: 300.0000 mg | ORAL_CAPSULE | Freq: Two times a day (BID) | ORAL | 0 refills | Status: DC

## 2020-11-30 MED ORDER — CEFDINIR 300 MG PO CAPS
300.0000 mg | ORAL_CAPSULE | Freq: Two times a day (BID) | ORAL | 0 refills | Status: DC
  Filled 2020-11-30: qty 4, 2d supply, fill #0

## 2020-11-30 MED ORDER — APIXABAN 2.5 MG PO TABS: 2.5000 mg | ORAL_TABLET | Freq: Two times a day (BID) | ORAL | 2 refills | Status: DC

## 2020-11-30 MED ORDER — APIXABAN 2.5 MG PO TABS
2.5000 mg | ORAL_TABLET | Freq: Two times a day (BID) | ORAL | 2 refills | Status: DC
  Filled 2020-11-30: qty 60, 30d supply, fill #0

## 2020-11-30 NOTE — TOC Transition Note (Signed)
Transition of Care [facility]) - CM/SW Discharge Note   Patient Details  Name: Charlotte Powell MRN: [id number] Date of Birth: 1928-12-12  [facility] CM/SW Contact:  Geralynn Ochs, LCSW Phone Number: 11/30/2020, 4:04 PM   Clinical Narrative:   CSW met with patient and daughter earlier today to discuss SNF bed offers, but was updated by patient and daughter that patient had improved and were wondering if she could return to ALF. CSW discussed with PT who was in agreement, recommending HH at ALF. CSW contacted [facility], spoke with Chrys Racer who said the patient could come back home and [facility] will setup home health with orders. CSW sent [facility] discharge information.  Nurse to call report to [phone].    Final next level of care: Assisted Living Barriers to Discharge: Barriers Resolved   Patient Goals and CMS Choice Patient states their goals for this hospitalization and ongoing recovery are:: patient unable to participate in goal setting, only oriented to self CMS Medicare.gov Compare Post Acute Care list provided to:: Patient Represenative (must comment) Choice offered to / list presented to : Adult Children  Discharge Placement              Patient chooses bed at: [facility] Patient to be transferred to facility by: Family car Name of family member notified: [name] Patient and family notified of of transfer: 11/30/20  Discharge Plan and Services     Post Acute Care Choice: [facility]                               Social Determinants of Health (SDOH) Interventions     Readmission Risk Interventions No flowsheet data found.

## 2020-11-30 NOTE — Plan of Care (Signed)
  Problem: Education: Goal: Knowledge of General Education information will improve Description Including pain rating scale, medication(s)/side effects and non-pharmacologic comfort measures Outcome: Progressing   

## 2020-11-30 NOTE — Plan of Care (Signed)
  Problem: Education: Goal: Knowledge of General Education information will improve Description: Including pain rating scale, medication(s)/side effects and non-pharmacologic comfort measures 11/30/2020 1540 by Arthor Captain, RN Outcome: Adequate for Discharge 11/30/2020 1535 by Arthor Captain, RN Outcome: Progressing   Problem: Health Behavior/Discharge Planning: Goal: Ability to manage health-related needs will improve 11/30/2020 1540 by Arthor Captain, RN Outcome: Adequate for Discharge 11/30/2020 1535 by Arthor Captain, RN Outcome: Progressing   Problem: Clinical Measurements: Goal: Ability to maintain clinical measurements within normal limits will improve 11/30/2020 1540 by Arthor Captain, RN Outcome: Adequate for Discharge 11/30/2020 1535 by Arthor Captain, RN Outcome: Progressing Goal: Will remain free from infection 11/30/2020 1540 by Arthor Captain, RN Outcome: Adequate for Discharge 11/30/2020 1535 by Arthor Captain, RN Outcome: Progressing Goal: Diagnostic test results will improve 11/30/2020 1540 by Arthor Captain, RN Outcome: Adequate for Discharge 11/30/2020 1535 by Arthor Captain, RN Outcome: Progressing Goal: Respiratory complications will improve 11/30/2020 1540 by Arthor Captain, RN Outcome: Adequate for Discharge 11/30/2020 1535 by Arthor Captain, RN Outcome: Progressing Goal: Cardiovascular complication will be avoided 11/30/2020 1540 by Arthor Captain, RN Outcome: Adequate for Discharge 11/30/2020 1535 by Arthor Captain, RN Outcome: Progressing

## 2020-11-30 NOTE — Discharge Instructions (Signed)

## 2020-11-30 NOTE — Evaluation (Signed)
Clinical/Bedside Swallow Evaluation Patient Details  Name: Charlotte Powell MRN: [id number] Date of Birth: 22-Jan-1929  Today's Date: 11/30/2020 Time: SLP Start Time (ACUTE ONLY): 0940 SLP Stop Time (ACUTE ONLY): 1004 SLP Time Calculation (min) (ACUTE ONLY): 24 min  Past Medical History:  Past Medical History:  Diagnosis Date   CAD (coronary artery disease)    Diverticulitis    GERD (gastroesophageal reflux disease)    HLD (hyperlipidemia)    HTN (hypertension)    Hypothyroidism    Past Surgical History:  Past Surgical History:  Procedure Laterality Date   ANKLE FRACTURE SURGERY     CHOLECYSTECTOMY     CORONARY STENT PLACEMENT     FEMUR FRACTURE SURGERY     WRIST FRACTURE SURGERY     HPI:  Patient is a 87 y.o. female who presented with confusion-thought to have acute metabolic encephalopathy in the setting of PNA and admitted to the hospitalist service.   Head CT 10/11 with no acute findings. Pt endorses some expressive aphasia  Pt with history of PAF, HTN, HLD, hypothyroidism-resident of a local SNF.    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  Pt was assessed using the Western Aphasia Battery - Bedside form (see below for additional information).  Pt exhibited some difficulty with repetition with longer phrases sentences.  There was some word finding difficulty.  Pt was able to self correct on several occasions.  Semantic paraphasia noted x1 ("elbow" for "shoulder") which she self corrected.  Pt did not benefit from semantic or phonmic cues for one item (telephone - she produced "phone" but could not access long form word target).  Pt's receptive language is seemingly in tact.  With one error on sequential command task, pt was able to perform correctly with a single repetition of instruction.  Introduced word finding strategies to pt, provided verbally and in written hand out.  Pt demonstrated ability to use  all strategies.  Daughter demonstrated understanding of strategies and recalled examples.    Pt would benefit from ST to address the above noted deficits while in-house, and would benefit from SLP follow up at next level of care.  Western Aphasia Battery - Bedside Form Content: 7/10 Fluency:  10/10 Yes/No: 10/10 Sequential Commands: 8/10 Repetition: 8.5/10 Naming: 9.5/10 Bedside Aphasia Score: 89/100    SLP Visit Diagnosis: Aphasia (R47.01)    Aspiration Risk       Diet Recommendation          Other  Recommendations      Recommendations for follow up therapy are one component of a multi-disciplinary discharge planning process, led by the attending physician.  Recommendations may be updated based on patient status, additional functional criteria and insurance authorization.  Follow up Recommendations Outpatient SLP;Skilled Nursing facility      Frequency and Duration min 2x/week          Prognosis        Swallow Study   General HPI: Patient is a 87 y.o. female who presented with confusion-thought to have acute metabolic encephalopathy in the setting of PNA and admitted to the hospitalist service.   Head CT 10/11 with no acute findings. Pt endorses some expressive aphasia  Pt with history of PAF, HTN, HLD, hypothyroidism-resident of a local SNF.    Oral/Motor/Sensory Function     Ice Chips     Thin Liquid      Nectar Thick     Honey Thick  Puree     Solid            Kerrie Pleasure, MA, CCC-SLP Acute Rehabilitation Services Office: [phone] 11/30/2020,10:31 AM

## 2020-11-30 NOTE — Discharge Summary (Signed)
PATIENT DETAILS Name: Charlotte Powell Age: 87 y.o. Sex: female Date of Birth: 08-Oct-1928 MRN: [id number]. Admitting Physician: Maretta Bees, MD [id number], Provider Not In  Admit Date: 11/27/2020 Discharge date: 11/30/2020  Recommendations for Outpatient Follow-up:  Follow up with PCP in 1-2 weeks Please obtain CMP/CBC in one week   Admitted From:  ALF   Disposition: ALF with home health services    Home Health: Yes  Equipment/Devices: None  Discharge Condition: Stable  CODE STATUS:  DNR  Diet recommendation:  Diet Order             Diet - low sodium heart healthy           Diet Heart Room service appropriate? Yes with Assist; Fluid consistency: Thin  Diet effective now                     Brief Narrative: Patient is a 87 y.o. female with history of PAF, HTN, HLD, hypothyroidism-resident of a local SNF-presented with confusion-thought to have acute metabolic encephalopathy in the setting of PNA and admitted to the hospitalist service.  Pertinent Labs/Radiology:   10/11>>Blood culture: No growth 10/12>> blood culture: No growth   10/11>>CXR: Left basilar opacities 10/11>> CT head: No acute infarction/ICH. 10/11>> CT angio head/neck: No hemodynamically significant stenosis.  Brief Hospital Course: Acute metabolic encephalopathy: Likely due to PNA-improved with IVF/IV antibiotics-she is much more awake and alert.     Mild expressive aphasia-?  CVA not seen on CT head: Her encephalopathy has improved-with supportive care-she was then noted to have some mild expressive aphasia-unclear whether she has had a small CVA-CT head was negative-patient has issues with claustrophobia.  Extensive discussion held with patient/daughter at bedside regarding pursuing MRI-however-family does not desire aggressive care-she is already on Eliquis-given the fact that obtaining a MRI of the brain we will not change outcome/treatment-after discussion with family-we  have elected not to pursue any further work-up.   Left lobar pneumonia: Likely aspiration-treated with Rocephin/Zithromax-we will transition to oral antimicrobial therapy for few more days on discharge.  Evaluated by SLP during this hospital stay and started on IV regular diet with thin liquid-as no obvious overt signs of aspiration apparent.     PAF: Continue Eliquis-not on any rate control agents in the outpatient setting.   Hypothyroidism: Continue Synthroid-TSH stable.   HLD: Continue statin   Debility/deconditioning: Appreciate PT/OT eval-initial recommendations were for SNF-however patient improved-and latest recommendations are back to ALF with home health services.   Procedures None  Discharge Diagnoses:  Principal Problem:   Encephalopathy Active Problems:   Community acquired pneumonia   Encephalopathy acute   Discharge Instructions:  Activity:  As tolerated with Full fall precautions use walker/cane & assistance as needed  Discharge Instructions     Call MD for:  difficulty breathing, headache or visual disturbances   Complete by: As directed    Diet - low sodium heart healthy   Complete by: As directed    Increase activity slowly   Complete by: As directed       Allergies as of 11/30/2020       Reactions   Amoxicillin    Hydrocodone    Confusion and AMS        Medication List     TAKE these medications    acetaminophen 500 MG tablet Commonly known as: TYLENOL Take 1,000 mg by mouth at bedtime.   acetaminophen 325 MG tablet Commonly known as: TYLENOL Take 650 mg by mouth  every 8 (eight) hours as needed (for pain).   apixaban 2.5 MG Tabs tablet Commonly known as: ELIQUIS Take 1 tablet (2.5 mg total) by mouth 2 (two) times daily. What changed:  medication strength how much to take   cefdinir 300 MG capsule Commonly known as: OMNICEF Take 1 capsule (300 mg total) by mouth 2 (two) times daily.   Cholecalciferol 25 MCG (1000 UT)  capsule Take 1,000 Units by mouth daily.   diclofenac Sodium 1 % Gel Commonly known as: VOLTAREN Apply 4 g topically in the morning and at bedtime. Apply to right side of neck   docusate sodium 100 MG capsule Commonly known as: COLACE Take 100 mg by mouth every other day.   famotidine 20 MG tablet Commonly known as: PEPCID Take 20 mg by mouth daily.   fluticasone 50 MCG/ACT nasal spray Commonly known as: FLONASE Place 1 spray into both nostrils 2 (two) times daily as needed for allergies.   folic acid 800 MCG tablet Commonly known as: FOLVITE Take 800 mcg by mouth daily.   gabapentin 100 MG capsule Commonly known as: NEURONTIN Take 100 mg by mouth at bedtime.   levothyroxine 25 MCG tablet Commonly known as: SYNTHROID Take 25 mcg by mouth daily before breakfast.   nitroGLYCERIN 0.4 MG SL tablet Commonly known as: NITROSTAT Place 0.4 mg under the tongue every 5 (five) minutes as needed for chest pain.   pravastatin 80 MG tablet Commonly known as: PRAVACHOL Take 80 mg by mouth at bedtime.        Follow-up Information     Primary care MD. Schedule an appointment as soon as possible for a visit in 1 week(s).                 Allergies  Allergen Reactions   Amoxicillin    Hydrocodone     Confusion and AMS      Consultations:  neurology    Other Procedures/Studies: CT Abdomen Pelvis W Contrast  Result Date: 11/16/2020 CLINICAL DATA:  Nausea vomiting and diarrhea EXAM: CT ABDOMEN AND PELVIS WITH CONTRAST TECHNIQUE: Multidetector CT imaging of the abdomen and pelvis was performed using the standard protocol following bolus administration of intravenous contrast. CONTRAST:  38mL OMNIPAQUE IOHEXOL 350 MG/ML SOLN COMPARISON:  None. FINDINGS: LOWER CHEST: Normal. HEPATOBILIARY: Normal hepatic contours. No intra- or extrahepatic biliary dilatation. Status post cholecystectomy. PANCREAS: Normal pancreas. No ductal dilatation or peripancreatic fluid collection.  SPLEEN: Normal. ADRENALS/URINARY TRACT: The adrenal glands are normal. No hydronephrosis, nephroureterolithiasis or solid renal mass. The urinary bladder is normal for degree of distention STOMACH/BOWEL: There is no hiatal hernia. Normal duodenal course and caliber. No small bowel dilatation or inflammation. No focal colonic abnormality. Normal appendix. VASCULAR/LYMPHATIC: There is calcific atherosclerosis of the abdominal aorta. No lymphadenopathy. REPRODUCTIVE: Normal uterus. No adnexal mass. MUSCULOSKELETAL. Multilevel degenerative disc disease and facet arthrosis. No bony spinal canal stenosis. OTHER: None. IMPRESSION: 1. No acute abnormality of the abdomen or pelvis. Aortic Atherosclerosis (ICD10-I70.0). Electronically Signed   By: Deatra Robinson M.D.   On: 11/16/2020 21:24   DG Chest Portable 1 View  Result Date: 11/27/2020 CLINICAL DATA:  r/o PNA EXAM: PORTABLE CHEST 1 VIEW COMPARISON:  None. FINDINGS: Left basilar opacities. No visible pleural effusions or pneumothorax. Limited visualization of the left lung apex due to overlapping neck/head. Mild enlargement of the cardiac silhouette. Severe left glenohumeral degenerative change with probably posttraumatic deformity of the humeral neck. Osteopenia. IMPRESSION: Left basilar opacities my which could represent atelectasis, aspiration,  and/or pneumonia. Electronically Signed   By: Feliberto Harts M.D.   On: 11/27/2020 13:52   CT HEAD CODE STROKE WO CONTRAST  Result Date: 11/27/2020 CLINICAL DATA:  Code stroke.  Neuro deficit, acute, stroke suspected EXAM: CT HEAD WITHOUT CONTRAST TECHNIQUE: Contiguous axial images were obtained from the base of the skull through the vertex without intravenous contrast. COMPARISON:  None. FINDINGS: Brain: There is no acute intracranial hemorrhage, mass effect, or edema. Gray-white differentiation is preserved. There is no extra-axial fluid collection. Patchy and confluent areas of hypoattenuation in the  supratentorial white matter are nonspecific but probably reflect moderate chronic microvascular ischemic changes. Prominence of the ventricles and sulci reflects parenchymal volume loss. Vascular: No hyperdense vessel.There is atherosclerotic calcification at the skull base. Skull: Calvarium is unremarkable. Sinuses/Orbits: No acute finding. Other: None. ASPECTS ([place] Stroke Program Early CT Score) - Ganglionic level infarction (caudate, lentiform nuclei, internal capsule, insula, M1-M3 cortex): 7 - Supraganglionic infarction (M4-M6 cortex): 3 Total score (0-10 with 10 being normal): 10 IMPRESSION: There is no acute intracranial hemorrhage or evidence of acute infarction. ASPECT score is 10. Moderate chronic microvascular ischemic changes. Superimposed acute small vessel infarct is difficult to exclude. These results were communicated to Dr. Thomasena Edis at 10:42 am on 11/27/2020 by text page via the [facility] messaging system. Electronically Signed   By: Guadlupe Spanish M.D.   On: 11/27/2020 10:44   CT ANGIO HEAD NECK W WO CM (CODE STROKE)  Result Date: 11/27/2020 CLINICAL DATA:  Stroke, follow up EXAM: CT ANGIOGRAPHY HEAD AND NECK TECHNIQUE: Multidetector CT imaging of the head and neck was performed using the standard protocol during bolus administration of intravenous contrast. Multiplanar CT image reconstructions and MIPs were obtained to evaluate the vascular anatomy. Carotid stenosis measurements (when applicable) are obtained utilizing NASCET criteria, using the distal internal carotid diameter as the denominator. CONTRAST:  40mL OMNIPAQUE IOHEXOL 350 MG/ML SOLN COMPARISON:  None. FINDINGS: CTA NECK Aortic arch: Mixed plaque along the arch. Great vessel origins are patent. No high-grade stenosis of the proximal subclavian arteries. Right carotid system: Patent. Mild primarily calcified plaque along the proximal internal carotid with minimal stenosis. Left carotid system: Patent. Mild primarily calcified plaque  along the proximal internal carotid with minimal stenosis. Vertebral arteries: Patent. Calcified plaque at the left vertebral origin without high-grade stenosis. Skeleton: Degenerative changes of the cervical spine. Other neck: Unremarkable. Upper chest: No apical lung mass. Review of the MIP images confirms the above findings CTA HEAD Anterior circulation: Intracranial internal carotid arteries are patent with calcified plaque but no significant stenosis. Anterior and middle cerebral arteries are patent. Anterior communicating artery is present. Posterior circulation: Intracranial vertebral arteries are patent. Basilar artery is patent. Major cerebellar artery origins are patent. Posterior cerebral arteries are patent. Basilar artery terminates as the right PCA. The left PCA has a fetal origin. Venous sinuses: Patent as allowed by contrast bolus timing. Review of the MIP images confirms the above findings IMPRESSION: No large vessel occlusion, hemodynamically significant stenosis, or evidence of dissection. These results were communicated to Dr. Thomasena Edis At 10:57 am on 11/27/2020 by text page via the [facility] messaging system. Electronically Signed   By: Guadlupe Spanish M.D.   On: 11/27/2020 11:07     TODAY-DAY OF DISCHARGE:  Subjective:   Peyton Najjar today has no headache,no chest abdominal pain,no new weakness tingling or numbness, feels much better wants to go home today.   Objective:   Blood pressure (!) 101/50, pulse 80, temperature 97.7 F (36.5  C), temperature source Oral, resp. rate 16, height  (1.6 m), weight 57 kg, SpO2 97 %.  Intake/Output Summary (Last 24 hours) at 11/30/2020 1536 Last data filed at 11/30/2020 1100 Gross per 24 hour  Intake 1080 ml  Output 400 ml  Net 680 ml   Filed Weights   11/28/20 0500 11/29/20 0500 11/30/20 0500  Weight: 53 kg 57.2 kg 57 kg    Exam: Awake Alert, Oriented *3, No new F.N deficits, Normal affect [place].AT,PERRAL Supple Neck,No JVD, No  cervical lymphadenopathy appriciated.  Symmetrical Chest wall movement, Good air movement bilaterally, CTAB RRR,No Gallops,Rubs or new Murmurs, No Parasternal Heave +ve B.Sounds, Abd Soft, Non tender, No organomegaly appriciated, No rebound -guarding or rigidity. No Cyanosis, Clubbing or edema, No new Rash or bruise   PERTINENT RADIOLOGIC STUDIES: No results found.   PERTINENT LAB RESULTS: CBC: Recent Labs    11/27/20 1632 11/28/20 0747  WBC  --  5.7  HGB 13.6 12.9  HCT 40.0 37.9  PLT  --  172   CMET CMP     Component Value Date/Time   NA 135 11/28/2020 0747   K 3.5 11/28/2020 0747   CL 102 11/28/2020 0747   CO2 26 11/28/2020 0747   GLUCOSE 98 11/28/2020 0747   BUN 6 (L) 11/28/2020 0747   CREATININE 0.74 11/28/2020 0747   CALCIUM 8.9 11/28/2020 0747   PROT 6.4 (L) 11/27/2020 1033   ALBUMIN 3.7 11/27/2020 1033   AST 26 11/27/2020 1033   ALT 9 11/27/2020 1033   ALKPHOS 85 11/27/2020 1033   BILITOT 1.7 (H) 11/27/2020 1033   GFRNONAA >60 11/28/2020 0747    GFR Estimated Creatinine Clearance: 38.7 mL/min (by C-G formula based on SCr of 0.74 mg/dL). No results for input(s): LIPASE, AMYLASE in the last 72 hours. No results for input(s): CKTOTAL, CKMB, CKMBINDEX, TROPONINI in the last 72 hours. Invalid input(s): POCBNP No results for input(s): DDIMER in the last 72 hours. No results for input(s): HGBA1C in the last 72 hours. No results for input(s): CHOL, HDL, LDLCALC, TRIG, CHOLHDL, LDLDIRECT in the last 72 hours. No results for input(s): TSH, T4TOTAL, T3FREE, THYROIDAB in the last 72 hours.  Invalid input(s): FREET3 No results for input(s): VITAMINB12, FOLATE, FERRITIN, TIBC, IRON, RETICCTPCT in the last 72 hours. Coags: No results for input(s): INR in the last 72 hours.  Invalid input(s): PT Microbiology: Recent Results (from the past 240 hour(s))  Blood culture (routine x 2)     Status: None (Preliminary result)   Collection Time: 11/27/20  4:05 PM    Specimen: BLOOD RIGHT ARM  Result Value Ref Range Status   Specimen Description BLOOD RIGHT ARM  Final   Special Requests   Final    BOTTLES DRAWN AEROBIC AND ANAEROBIC Blood Culture adequate volume   Culture   Final    NO GROWTH 3 DAYS Performed at [facility], [address], [place], [place] [address]    Report Status PENDING  Incomplete  Resp Panel by RT-PCR (Flu A&B, Covid) Nasopharyngeal Swab     Status: None   Collection Time: 11/27/20  6:30 PM   Specimen: Nasopharyngeal Swab; Nasopharyngeal(NP) swabs in vial transport medium  Result Value Ref Range Status   SARS Coronavirus 2 by RT PCR NEGATIVE NEGATIVE Final    Comment: (NOTE) SARS-CoV-2 target nucleic acids are NOT DETECTED.  The SARS-CoV-2 RNA is generally detectable in upper respiratory specimens during the acute phase of infection. The lowest concentration of SARS-CoV-2 viral  copies this assay can detect is 138 copies/mL. A negative result does not preclude SARS-Cov-2 infection and should not be used as the sole basis for treatment or other patient management decisions. A negative result may occur with  improper specimen collection/handling, submission of specimen other than nasopharyngeal swab, presence of viral mutation(s) within the areas targeted by this assay, and inadequate number of viral copies(<138 copies/mL). A negative result must be combined with clinical observations, patient history, and epidemiological information. The expected result is Negative.  Fact Sheet for Patients:  BloggerCourse.com  Fact Sheet for Healthcare Providers:  SeriousBroker.it  This test is no t yet approved or cleared by the [place] FDA and  has been authorized for detection and/or diagnosis of SARS-CoV-2 by FDA under an Emergency Use Authorization (EUA). This EUA will remain  in effect (meaning this test can be used) for the duration of the COVID-19 declaration  under Section 564(b)(1) of the Act, 21 U.S.C.section 360bbb-3(b)(1), unless the authorization is terminated  or revoked sooner.       Influenza A by PCR NEGATIVE NEGATIVE Final   Influenza B by PCR NEGATIVE NEGATIVE Final    Comment: (NOTE) The Xpert Xpress SARS-CoV-2/FLU/RSV plus assay is intended as an aid in the diagnosis of influenza from Nasopharyngeal swab specimens and should not be used as a sole basis for treatment. Nasal washings and aspirates are unacceptable for Xpert Xpress SARS-CoV-2/FLU/RSV testing.  Fact Sheet for Patients: BloggerCourse.com  Fact Sheet for Healthcare Providers: SeriousBroker.it  This test is not yet approved or cleared by the [place] FDA and has been authorized for detection and/or diagnosis of SARS-CoV-2 by FDA under an Emergency Use Authorization (EUA). This EUA will remain in effect (meaning this test can be used) for the duration of the COVID-19 declaration under Section 564(b)(1) of the Act, 21 U.S.C. section 360bbb-3(b)(1), unless the authorization is terminated or revoked.  Performed at [facility], [address], [place], [place] [address]   Blood culture (routine x 2)     Status: None (Preliminary result)   Collection Time: 11/28/20  3:55 AM   Specimen: BLOOD  Result Value Ref Range Status   Specimen Description BLOOD RIGHT ANTECUBITAL  Final   Special Requests AEROBIC BOTTLE ONLY Blood Culture adequate volume  Final   Culture   Final    NO GROWTH 2 DAYS Performed at [facility], [address] [place], [place] [address]    Report Status PENDING  Incomplete    FURTHER DISCHARGE INSTRUCTIONS:  Get Medicines reviewed and adjusted: Please take all your medications with you for your next visit with your Primary MD  Laboratory/radiological data: Please request your Primary MD to go over all hospital tests and procedure/radiological results at the follow up,  please ask your Primary MD to get all Hospital records sent to his/her office.  In some cases, they will be blood work, cultures and biopsy results pending at the time of your discharge. Please request that your primary care M.D. goes through all the records of your hospital data and follows up on these results.  Also Note the following: If you experience worsening of your admission symptoms, develop shortness of breath, life threatening emergency, suicidal or homicidal thoughts you must seek medical attention immediately by calling 911 or calling your MD immediately  if symptoms less severe.  You must read complete instructions/literature along with all the possible adverse reactions/side effects for all the Medicines you take and that have been prescribed  to you. Take any new Medicines after you have completely understood and accpet all the possible adverse reactions/side effects.   Do not drive when taking Pain medications or sleeping medications (Benzodaizepines)  Do not take more than prescribed Pain, Sleep and Anxiety Medications. It is not advisable to combine anxiety,sleep and pain medications without talking with your primary care practitioner  Special Instructions: If you have smoked or chewed Tobacco  in the last 2 yrs please stop smoking, stop any regular Alcohol  and or any Recreational drug use.  Wear Seat belts while driving.  Please note: You were cared for by a hospitalist during your hospital stay. Once you are discharged, your primary care physician will handle any further medical issues. Please note that NO REFILLS for any discharge medications will be authorized once you are discharged, as it is imperative that you return to your primary care physician (or establish a relationship with a primary care physician if you do not have one) for your post hospital discharge needs so that they can reassess your need for medications and monitor your lab values.  Total Time spent  coordinating discharge including counseling, education and face to face time equals 35 minutes.  SignedJeoffrey Massed 11/30/2020 3:36 PM

## 2020-11-30 NOTE — Progress Notes (Addendum)
Physical Therapy Treatment Patient Details Name: Charlotte Powell MRN: [id number] DOB: 25-Apr-1928 Today's Date: 11/30/2020   History of Present Illness 87 y.o. F Admitted on 10/11 due to aphasia and confusion, CT negative. Prior medical history significant of paroxysmal A. fib, history of hypertension and hyperlipidemia, and hypothyroidism.    PT Comments    Patient received in recliner she is agreeable to PT session. Daughter present in room. Patient required supervision for sit to stand from recliner, mod assist from commode. Ambulated 60 feet in room with RW and min guard. Patient making good progress toward goals and mentation is much improved from last session. She will continue to benefit from skilled PT while here to improve strength and functional independence.     Recommendations for follow up therapy are one component of a multi-disciplinary discharge planning process, led by the attending physician.  Recommendations may be updated based on patient status, additional functional criteria and insurance authorization.  Follow Up Recommendations  HHPT     Equipment Recommendations  None recommended by PT    Recommendations for Other Services       Precautions / Restrictions Precautions Precautions: Fall Restrictions Weight Bearing Restrictions: No     Mobility  Bed Mobility               General bed mobility comments: patient received in recliner and remained in recliner at end of session    Transfers Overall transfer level: Needs assistance Equipment used: Rolling walker (2 wheeled) Transfers: Sit to/from Stand Sit to Stand: Supervision;Mod assist         General transfer comment: Patient able to stand from recliner with supervision. Required mod assist to stand from low commode.  Ambulation/Gait Ambulation/Gait assistance: Min guard Gait Distance (Feet): 60 Feet Assistive device: Rolling walker (2 wheeled) Gait Pattern/deviations: Step-through  pattern;Decreased stride length Gait velocity: decr   General Gait Details: Patient required min guard only for ambulation with increased endurance this session.   Stairs             Wheelchair Mobility    Modified Rankin (Stroke Patients Only)       Balance Overall balance assessment: Needs assistance Sitting-balance support: Feet supported Sitting balance-Leahy Scale: Good     Standing balance support: Bilateral upper extremity supported;During functional activity Standing balance-Leahy Scale: Fair Standing balance comment: UE support needed for balance, able to wash hands at sink without support.                            Cognition Arousal/Alertness: Awake/alert Behavior During Therapy: WFL for tasks assessed/performed Overall Cognitive Status: Within Functional Limits for tasks assessed Area of Impairment: Memory                       Following Commands: Follows one step commands consistently       General Comments: Patient with improved mentation from last visit. Able to follow all directions. Alert and oriented. No garbled speech this session.      Exercises      General Comments        Pertinent Vitals/Pain Pain Assessment: No/denies pain Faces Pain Scale: No hurt    Home Living       Type of Home: Assisted living [facility])              Prior Function            PT Goals (current goals  can now be found in the care plan section) Acute Rehab PT Goals Patient Stated Goal: to return to rehab PT Goal Formulation: With patient Time For Goal Achievement: 12/12/20 Potential to Achieve Goals: Good Progress towards PT goals: Progressing toward goals    Frequency    Min 2X/week      PT Plan Current plan remains appropriate    Co-evaluation              AM-PAC PT "6 Clicks" Mobility   Outcome Measure  Help needed turning from your back to your side while in a flat bed without using bedrails?:  A Little Help needed moving from lying on your back to sitting on the side of a flat bed without using bedrails?: A Little Help needed moving to and from a bed to a chair (including a wheelchair)?: A Little Help needed standing up from a chair using your arms (e.g., wheelchair or bedside chair)?: A Little Help needed to walk in hospital room?: A Little Help needed climbing 3-5 steps with a railing? : A Lot 6 Click Score: 17    End of Session Equipment Utilized During Treatment: Gait belt Activity Tolerance: Patient tolerated treatment well Patient left: in chair;with call bell/phone within reach;with family/visitor present Nurse Communication: Mobility status PT Visit Diagnosis: Muscle weakness (generalized) (M62.81);History of falling (Z91.81);Difficulty in walking, not elsewhere classified (R26.2)     Time: 6553-7482 PT Time Calculation (min) (ACUTE ONLY): 18 min  Charges:  $Gait Training: 8-22 mins                     Leeanne Butters, PT, GCS 11/30/20,10:53 AM

## 2020-11-30 NOTE — NC FL2 (Signed)
[place] MEDICAID FL2 LEVEL OF CARE SCREENING TOOL     IDENTIFICATION  Patient Name: Charlotte Powell Birthdate: 1928/03/21 Sex: female Admission Date (Current Location): 11/27/2020  [facility] and [place] Number:  Producer, television/film/video and Address:  [facility] [place], [address] [place], [place] [address]      Provider Number: [id number]  Attending Physician Name and Address:  Maretta Bees, MD  Relative Name and Phone Number:       Current Level of Care: Hospital Recommended Level of Care: Assisted Living Facility Prior Approval Number:    Date Approved/Denied:   PASRR Number:    Discharge Plan: Other (Comment) (ALF)    Current Diagnoses: Patient Active Problem List   Diagnosis Date Noted   Encephalopathy acute 11/28/2020   Encephalopathy 11/27/2020   Community acquired pneumonia 11/27/2020   Gastroenteritis 11/16/2020   Nausea & vomiting 11/16/2020   A-fib (HCC) 11/16/2020    Orientation RESPIRATION BLADDER Height & Weight     Self, Place  Normal Incontinent Weight: 125 lb 10.6 oz (57 kg) Height:  5\' 3"  (160 cm)  BEHAVIORAL SYMPTOMS/MOOD NEUROLOGICAL BOWEL NUTRITION STATUS      Continent Diet (no added salt)  AMBULATORY STATUS COMMUNICATION OF NEEDS Skin   Limited Assist Verbally Normal                       Personal Care Assistance Level of Assistance  Bathing, Feeding, Dressing Bathing Assistance: Limited assistance Feeding assistance: Independent Dressing Assistance: Limited assistance     Functional Limitations Info  Sight Sight Info: Impaired (glasses)        SPECIAL CARE FACTORS FREQUENCY  PT (By licensed PT), OT (By licensed OT)     PT Frequency: 3x/wk with home health OT Frequency: 3x/wk with home health            Contractures Contractures Info: Not present    Additional Factors Info  Code Status, Allergies Code Status Info: DNR Allergies Info: Amoxicillin, Hydrocodone           Current  Medications (11/30/2020):  This is the current hospital active medication list Current Facility-Administered Medications  Medication Dose Route Frequency Provider Last Rate Last Admin   acetaminophen (TYLENOL) tablet 1,000 mg  1,000 mg Oral QHS 12/02/2020, MD   1,000 mg at 11/29/20 2126   acetaminophen (TYLENOL) tablet 650 mg  650 mg Oral Q8H PRN 2127, MD       apixaban Dorcas Carrow) tablet 2.5 mg  2.5 mg Oral BID Everlene Balls, MD   2.5 mg at 11/30/20 0929   cefTRIAXone (ROCEPHIN) 2 g in sodium chloride 0.9 % 100 mL IVPB  2 g Intravenous Q24H 12/02/20, MD 200 mL/hr at 11/29/20 2133 2 g at 11/29/20 2133   cholecalciferol (VITAMIN D3) tablet 1,000 Units  1,000 Units Oral Daily 2134, MD   1,000 Units at 11/30/20 0930   docusate sodium (COLACE) capsule 100 mg  100 mg Oral 12/02/20, Vonna Drafts, MD   100 mg at 11/29/20 1018   famotidine (PEPCID) tablet 20 mg  20 mg Oral Daily 12/01/20, MD   20 mg at 11/30/20 0929   fluticasone (FLONASE) 50 MCG/ACT nasal spray 1 spray  1 spray Each Nare BID PRN 12/02/20, MD       folic acid (FOLVITE) tablet 1 mg  1,000 mcg Oral Daily Dorcas Carrow, MD   1 mg at 11/30/20 0929   gabapentin (NEURONTIN) capsule  100 mg  100 mg Oral QHS Dorcas Carrow, MD   100 mg at 11/29/20 2126   levothyroxine (SYNTHROID) tablet 25 mcg  25 mcg Oral QAC breakfast Dorcas Carrow, MD   25 mcg at 11/30/20 0388   nitroGLYCERIN (NITROSTAT) SL tablet 0.4 mg  0.4 mg Sublingual Q5 min PRN Dorcas Carrow, MD       pravastatin (PRAVACHOL) tablet 80 mg  80 mg Oral Daily Dorcas Carrow, MD   80 mg at 11/30/20 0929   sodium chloride flush (NS) 0.9 % injection 10-40 mL  10-40 mL Intracatheter Q12H Dorcas Carrow, MD   10 mL at 11/29/20 2134   sodium chloride flush (NS) 0.9 % injection 10-40 mL  10-40 mL Intracatheter PRN Dorcas Carrow, MD       sodium chloride flush (NS) 0.9 % injection 3 mL  3 mL Intravenous Once Dorcas Carrow, MD         Discharge  Medications: TAKE these medications     acetaminophen 500 MG tablet Commonly known as: TYLENOL Take 1,000 mg by mouth at bedtime.    acetaminophen 325 MG tablet Commonly known as: TYLENOL Take 650 mg by mouth every 8 (eight) hours as needed (for pain).    apixaban 2.5 MG Tabs tablet Commonly known as: ELIQUIS Take 1 tablet (2.5 mg total) by mouth 2 (two) times daily. What changed:  medication strength how much to take    cefdinir 300 MG capsule Commonly known as: OMNICEF Take 1 capsule (300 mg total) by mouth 2 (two) times daily.    Cholecalciferol 25 MCG (1000 UT) capsule Take 1,000 Units by mouth daily.    diclofenac Sodium 1 % Gel Commonly known as: VOLTAREN Apply 4 g topically in the morning and at bedtime. Apply to right side of neck    docusate sodium 100 MG capsule Commonly known as: COLACE Take 100 mg by mouth every other day.    famotidine 20 MG tablet Commonly known as: PEPCID Take 20 mg by mouth daily.    fluticasone 50 MCG/ACT nasal spray Commonly known as: FLONASE Place 1 spray into both nostrils 2 (two) times daily as needed for allergies.    folic acid 800 MCG tablet Commonly known as: FOLVITE Take 800 mcg by mouth daily.    gabapentin 100 MG capsule Commonly known as: NEURONTIN Take 100 mg by mouth at bedtime.    levothyroxine 25 MCG tablet Commonly known as: SYNTHROID Take 25 mcg by mouth daily before breakfast.    nitroGLYCERIN 0.4 MG SL tablet Commonly known as: NITROSTAT Place 0.4 mg under the tongue every 5 (five) minutes as needed for chest pain.    pravastatin 80 MG tablet Commonly known as: PRAVACHOL Take 80 mg by mouth at bedtime.    Relevant Imaging Results:  Relevant Lab Results:   Additional Information SS#: [id number]  Baldemar Lenis, LCSW

## 2020-11-30 NOTE — Progress Notes (Signed)
PROGRESS NOTE        PATIENT DETAILS Name: Charlotte Powell Age: 87 y.o. Sex: female Date of Birth: Jul 24, 1928 Admit Date: 11/27/2020 Admitting Physician Dewayne Shorter Levora Dredge, MD [id number], Provider Not In  Brief Narrative: Patient is a 87 y.o. female with history of PAF, HTN, HLD, hypothyroidism-resident of a local SNF-presented with confusion-thought to have acute metabolic encephalopathy in the setting of PNA and admitted to the hospitalist service.  Subjective: Speech is more fluent than yesterday-patient/daughter both acknowledge improvement in her speech.  No other issues overnight.  Apparently had some sundowning last night.  Objective: Vitals: Blood pressure (!) 120/51, pulse 90, temperature 98.4 F (36.9 C), temperature source Oral, resp. rate 18, height 5\' 3"  (1.6 m), weight 57 kg, SpO2 94 %.   Exam: Gen Exam: Lying comfortably in bed. HEENT:atraumatic, normocephalic Chest: B/L clear to auscultation anteriorly CVS:S1S2 regular Abdomen:soft non tender, non distended Extremities:no edema Neurology: Non focal Skin: no rash   Pertinent Labs/Radiology:  10/11>>Blood culture: No growth 10/12>> blood culture: No growth  10/11>>CXR: Left basilar opacities 10/11>> CT head: No acute infarction/ICH. 10/11>> CT angio head/neck: No hemodynamically significant stenosis.  Assessment/Plan: Acute metabolic encephalopathy: Likely due to PNA-improved with IVF/IV antibiotics-she is much more awake and alert.    Mild expressive aphasia-?  CVA not seen on CT head: Her encephalopathy has improved-with supportive care-she was then noted to have some mild expressive aphasia-unclear whether she has had a small CVA-CT head was negative-patient has issues with claustrophobia.  Extensive discussion held with patient/daughter at bedside regarding pursuing MRI-however-family does not desire aggressive care-she is already on Eliquis-given the fact that obtaining a MRI of  the brain we will not change outcome/treatment-after discussion with family-we have elected not to pursue any further work-up.  Left lobar pneumonia: Likely aspiration-continue Rocephin/Zithromax-given advanced age/frailty-suspect requires another few more days of hospitalization before consideration of discharge.  Appreciate SLP evaluation.  PAF: Continue Eliquis-not on any rate control agents in the outpatient setting.  Hypothyroidism: Continue Synthroid-TSH stable.  HLD: Continue statin  Debility/deconditioning: Appreciate PT/OT eval-recommendations are for SNF.  Discussed with .    Procedures: None Consults: None DVT Prophylaxis: Eliquis Code Status:DNR Family Communication: Daughter at bedside  Time spent: 41 minutes-Greater than 50% of this time was spent in counseling, explanation of diagnosis, planning of further management, and coordination of care.  Diet: Diet Order             Diet Heart Room service appropriate? Yes with Assist; Fluid consistency: Thin  Diet effective now                      Disposition Plan: Status is: Observation  The patient will require care spanning > 2 midnights and should be moved to inpatient because: Inpatient level of care appropriate due to severity of illness  Dispo: The patient is from: ALF              Anticipated d/c is to: ALF              Patient currently is not medically stable to d/c.   Difficult to place patient No     Barriers to Discharge: Resolving encephalopathy-on IV antibiotics for pneumonia-although improved-not yet at baseline.    Antimicrobial agents: Anti-infectives (From admission, onward)    Start     Dose/Rate Route Frequency  Ordered Stop   11/27/20 1945  cefTRIAXone (ROCEPHIN) 2 g in sodium chloride 0.9 % 100 mL IVPB        2 g 200 mL/hr over 30 Minutes Intravenous Every 24 hours 11/27/20 1934 12/02/20 1959   11/27/20 1945  azithromycin (ZITHROMAX) 500 mg in sodium chloride 0.9 % 250  mL IVPB  Status:  Discontinued        500 mg 250 mL/hr over 60 Minutes Intravenous Every 24 hours 11/27/20 1934 11/29/20 1330   11/27/20 1400  cefTRIAXone (ROCEPHIN) 2 g in sodium chloride 0.9 % 100 mL IVPB        2 g 200 mL/hr over 30 Minutes Intravenous  Once 11/27/20 1357 11/27/20 1715   11/27/20 1400  azithromycin (ZITHROMAX) 500 mg in sodium chloride 0.9 % 250 mL IVPB        500 mg 250 mL/hr over 60 Minutes Intravenous  Once 11/27/20 1357 11/27/20 1830        MEDICATIONS: Scheduled Meds:  acetaminophen  1,000 mg Oral QHS   apixaban  2.5 mg Oral BID   cholecalciferol  1,000 Units Oral Daily   docusate sodium  100 mg Oral QODAY   famotidine  20 mg Oral Daily   folic acid  1,000 mcg Oral Daily   gabapentin  100 mg Oral QHS   levothyroxine  25 mcg Oral QAC breakfast   pravastatin  80 mg Oral Daily   sodium chloride flush  10-40 mL Intracatheter Q12H   sodium chloride flush  3 mL Intravenous Once   Continuous Infusions:  cefTRIAXone (ROCEPHIN)  IV 2 g (11/29/20 2133)   PRN Meds:.acetaminophen, fluticasone, nitroGLYCERIN, sodium chloride flush   I have personally reviewed following labs and imaging studies  LABORATORY DATA: CBC: Recent Labs  Lab 11/27/20 1033 11/27/20 1039 11/27/20 1632 11/28/20 0747  WBC 8.2  --   --  5.7  NEUTROABS 2.4  --   --   --   HGB 14.6 13.9 13.6 12.9  HCT 43.7 41.0 40.0 37.9  MCV 102.3*  --   --  100.0  PLT 181  --   --  172     Basic Metabolic Panel: Recent Labs  Lab 11/27/20 1033 11/27/20 1039 11/27/20 1632 11/28/20 0747  NA 136 137 136 135  K 4.7 4.7 3.6 3.5  CL 104 105  --  102  CO2 22  --   --  26  GLUCOSE 99 98  --  98  BUN 8 8  --  6*  CREATININE 0.76 0.50  --  0.74  CALCIUM 9.7  --   --  8.9     GFR: Estimated Creatinine Clearance: 38.7 mL/min (by C-G formula based on SCr of 0.74 mg/dL).  Liver Function Tests: Recent Labs  Lab 11/27/20 1033  AST 26  ALT 9  ALKPHOS 85  BILITOT 1.7*  PROT 6.4*  ALBUMIN  3.7    No results for input(s): LIPASE, AMYLASE in the last 168 hours. Recent Labs  Lab 11/27/20 1226  AMMONIA 26     Coagulation Profile: Recent Labs  Lab 11/27/20 1033  INR 1.2     Cardiac Enzymes: No results for input(s): CKTOTAL, CKMB, CKMBINDEX, TROPONINI in the last 168 hours.  BNP (last 3 results) No results for input(s): PROBNP in the last 8760 hours.  Lipid Profile: No results for input(s): CHOL, HDL, LDLCALC, TRIG, CHOLHDL, LDLDIRECT in the last 72 hours.  Thyroid Function Tests: Recent Labs    11/27/20 1226  TSH 4.119     Anemia Panel: No results for input(s): VITAMINB12, FOLATE, FERRITIN, TIBC, IRON, RETICCTPCT in the last 72 hours.  Urine analysis:    Component Value Date/Time   COLORURINE STRAW (A) 11/27/2020 1226   APPEARANCEUR CLEAR 11/27/2020 1226   LABSPEC 1.018 11/27/2020 1226   PHURINE 8.0 11/27/2020 1226   GLUCOSEU NEGATIVE 11/27/2020 1226   HGBUR NEGATIVE 11/27/2020 1226   BILIRUBINUR NEGATIVE 11/27/2020 1226   KETONESUR NEGATIVE 11/27/2020 1226   PROTEINUR NEGATIVE 11/27/2020 1226   NITRITE NEGATIVE 11/27/2020 1226   LEUKOCYTESUR NEGATIVE 11/27/2020 1226    Sepsis Labs: Lactic Acid, Venous    Component Value Date/Time   LATICACIDVEN 0.9 11/27/2020 1331    MICROBIOLOGY: Recent Results (from the past 240 hour(s))  Blood culture (routine x 2)     Status: None (Preliminary result)   Collection Time: 11/27/20  4:05 PM   Specimen: BLOOD RIGHT ARM  Result Value Ref Range Status   Specimen Description BLOOD RIGHT ARM  Final   Special Requests   Final    BOTTLES DRAWN AEROBIC AND ANAEROBIC Blood Culture adequate volume   Culture   Final    NO GROWTH 3 DAYS Performed at [facility], [address] [place], [place] [address]    Report Status PENDING  Incomplete  Resp Panel by RT-PCR (Flu A&B, Covid) Nasopharyngeal Swab     Status: None   Collection Time: 11/27/20  6:30 PM   Specimen: Nasopharyngeal Swab;  Nasopharyngeal(NP) swabs in vial transport medium  Result Value Ref Range Status   SARS Coronavirus 2 by RT PCR NEGATIVE NEGATIVE Final    Comment: (NOTE) SARS-CoV-2 target nucleic acids are NOT DETECTED.  The SARS-CoV-2 RNA is generally detectable in upper respiratory specimens during the acute phase of infection. The lowest concentration of SARS-CoV-2 viral copies this assay can detect is 138 copies/mL. A negative result does not preclude SARS-Cov-2 infection and should not be used as the sole basis for treatment or other patient management decisions. A negative result may occur with  improper specimen collection/handling, submission of specimen other than nasopharyngeal swab, presence of viral mutation(s) within the areas targeted by this assay, and inadequate number of viral copies(<138 copies/mL). A negative result must be combined with clinical observations, patient history, and epidemiological information. The expected result is Negative.  Fact Sheet for Patients:  BloggerCourse.com  Fact Sheet for Healthcare Providers:  SeriousBroker.it  This test is no t yet approved or cleared by the [place] FDA and  has been authorized for detection and/or diagnosis of SARS-CoV-2 by FDA under an Emergency Use Authorization (EUA). This EUA will remain  in effect (meaning this test can be used) for the duration of the COVID-19 declaration under Section 564(b)(1) of the Act, 21 U.S.C.section 360bbb-3(b)(1), unless the authorization is terminated  or revoked sooner.       Influenza A by PCR NEGATIVE NEGATIVE Final   Influenza B by PCR NEGATIVE NEGATIVE Final    Comment: (NOTE) The Xpert Xpress SARS-CoV-2/FLU/RSV plus assay is intended as an aid in the diagnosis of influenza from Nasopharyngeal swab specimens and should not be used as a sole basis for treatment. Nasal washings and aspirates are unacceptable for Xpert Xpress  SARS-CoV-2/FLU/RSV testing.  Fact Sheet for Patients: BloggerCourse.com  Fact Sheet for Healthcare Providers: SeriousBroker.it  This test is not yet approved or cleared by the [place] FDA and has been authorized for detection and/or diagnosis of SARS-CoV-2 by FDA under an Emergency Use  Authorization (EUA). This EUA will remain in effect (meaning this test can be used) for the duration of the COVID-19 declaration under Section 564(b)(1) of the Act, 21 U.S.C. section 360bbb-3(b)(1), unless the authorization is terminated or revoked.  Performed at [facility], [address] [place], [place] [address]   Blood culture (routine x 2)     Status: None (Preliminary result)   Collection Time: 11/28/20  3:55 AM   Specimen: BLOOD  Result Value Ref Range Status   Specimen Description BLOOD RIGHT ANTECUBITAL  Final   Special Requests AEROBIC BOTTLE ONLY Blood Culture adequate volume  Final   Culture   Final    NO GROWTH 2 DAYS Performed at [facility], [address], [place], [place] [address]    Report Status PENDING  Incomplete    RADIOLOGY STUDIES/RESULTS: No results found.   LOS: 2 days   Jeoffrey Massed, MD  [facility]    To contact the attending provider between 7A-7P or the covering provider during after hours 7P-7A, please log into the web site [email or web address] and access using universal [place] password for that web site. If you do not have the password, please call the hospital operator.  11/30/2020, 10:27 AM

## 2020-12-02 LAB — CULTURE, BLOOD (ROUTINE X 2)
Culture: NO GROWTH
Special Requests: ADEQUATE

## 2020-12-03 LAB — CULTURE, BLOOD (ROUTINE X 2)
Culture: NO GROWTH
Special Requests: ADEQUATE

## 2020-12-05 ENCOUNTER — Telehealth: Payer: Self-pay | Admitting: Pharmacist

## 2020-12-05 ENCOUNTER — Other Ambulatory Visit: Payer: Self-pay

## 2020-12-05 NOTE — Telephone Encounter (Signed)
Pharmacy Transitions of Care Follow-up Telephone Call  Date of discharge: 11/30/2020  Discharge Diagnosis: Encephalopathy  Medication changes made at discharge: START taking: cefdinir (OMNICEF) CHANGE how you take: Eliquis (apixaban) Take 1 tablet (2.5 mg total) by mouth 2 (two) times daily. What changed: medication strength how much to take  Medication changes verified by the patient? Yes   Medication Accessibility:  Home Pharmacy: N/A   Was the patient provided with refills on discharged medications? Yes, 2 refills   Is the patient able to afford medications? Yes  Pt has insurance that covers the medication and is currently at an SNF where she gets her medications.  Medication Review:  APIXABAN (ELIQUIS)  Apixaban 2.5 mg BID initiated on 11/30/20.  - Discussed importance of taking medication around the same time everyday  - Reviewed potential DDIs with patient  - Advised patient of medications to avoid (NSAIDs, ASA)  - Educated that Tylenol (acetaminophen) will be the preferred analgesic to prevent risk of bleeding  - Emphasized importance of monitoring for signs and symptoms of bleeding (abnormal bruising, prolonged bleeding, nose bleeds, bleeding from gums, discolored urine, black tarry stools)  - Advised patient to alert all providers of anticoagulation therapy prior to starting a new medication or having a procedure   Follow-up Appointments:  No follow up appointments scheduled. Needs to schedule one ASAP for a week after d/c.  Final Patient Assessment Talked to pt's daughter about medication. She was concerned about the dose of the Eliquis being cut in half since her mother has Afib. I informed the pt's daughter to set up an appt with her PCP ASAP and see if the dose needs to remain lowered. Pt's daughter understood and plans to schedule appt soon.

## 2021-02-09 ENCOUNTER — Other Ambulatory Visit: Payer: Self-pay

## 2021-02-09 ENCOUNTER — Observation Stay (HOSPITAL_COMMUNITY): Payer: Medicare HMO

## 2021-02-09 ENCOUNTER — Emergency Department (HOSPITAL_COMMUNITY): Payer: Medicare HMO

## 2021-02-09 ENCOUNTER — Observation Stay (HOSPITAL_COMMUNITY)
Admission: EM | Admit: 2021-02-09 | Discharge: 2021-02-12 | Disposition: A | Discharge status: Home / Self Care | Payer: Medicare HMO | Attending: Internal Medicine | Admitting: Internal Medicine

## 2021-02-09 DIAGNOSIS — Z7901 Long term (current) use of anticoagulants: Secondary | ICD-10-CM | POA: Insufficient documentation

## 2021-02-09 DIAGNOSIS — A084 Viral intestinal infection, unspecified: Principal | ICD-10-CM | POA: Diagnosis present

## 2021-02-09 DIAGNOSIS — E860 Dehydration: Secondary | ICD-10-CM | POA: Diagnosis not present

## 2021-02-09 DIAGNOSIS — Z79899 Other long term (current) drug therapy: Secondary | ICD-10-CM | POA: Insufficient documentation

## 2021-02-09 DIAGNOSIS — R197 Diarrhea, unspecified: Secondary | ICD-10-CM

## 2021-02-09 DIAGNOSIS — I10 Essential (primary) hypertension: Secondary | ICD-10-CM | POA: Insufficient documentation

## 2021-02-09 DIAGNOSIS — I482 Chronic atrial fibrillation, unspecified: Secondary | ICD-10-CM | POA: Diagnosis not present

## 2021-02-09 DIAGNOSIS — Z66 Do not resuscitate: Secondary | ICD-10-CM

## 2021-02-09 DIAGNOSIS — J189 Pneumonia, unspecified organism: Secondary | ICD-10-CM | POA: Insufficient documentation

## 2021-02-09 DIAGNOSIS — Z20822 Contact with and (suspected) exposure to covid-19: Secondary | ICD-10-CM | POA: Diagnosis not present

## 2021-02-09 DIAGNOSIS — R112 Nausea with vomiting, unspecified: Secondary | ICD-10-CM

## 2021-02-09 DIAGNOSIS — R2681 Unsteadiness on feet: Secondary | ICD-10-CM | POA: Insufficient documentation

## 2021-02-09 DIAGNOSIS — E039 Hypothyroidism, unspecified: Secondary | ICD-10-CM | POA: Insufficient documentation

## 2021-02-09 DIAGNOSIS — E785 Hyperlipidemia, unspecified: Secondary | ICD-10-CM

## 2021-02-09 DIAGNOSIS — I2510 Atherosclerotic heart disease of native coronary artery without angina pectoris: Secondary | ICD-10-CM | POA: Insufficient documentation

## 2021-02-09 DIAGNOSIS — I4891 Unspecified atrial fibrillation: Secondary | ICD-10-CM | POA: Diagnosis not present

## 2021-02-09 DIAGNOSIS — E876 Hypokalemia: Secondary | ICD-10-CM | POA: Diagnosis not present

## 2021-02-09 DIAGNOSIS — M6281 Muscle weakness (generalized): Secondary | ICD-10-CM | POA: Insufficient documentation

## 2021-02-09 DIAGNOSIS — I4819 Other persistent atrial fibrillation: Secondary | ICD-10-CM | POA: Diagnosis present

## 2021-02-09 LAB — CBC WITH DIFFERENTIAL/PLATELET
Abs Immature Granulocytes: 0.01 10*3/uL (ref 0.00–0.07)
Basophils Absolute: 0 10*3/uL (ref 0.0–0.1)
Basophils Relative: 0 %
Eosinophils Absolute: 0 10*3/uL (ref 0.0–0.5)
Eosinophils Relative: 0 %
HCT: 43.3 % (ref 36.0–46.0)
Hemoglobin: 14.5 g/dL (ref 12.0–15.0)
Immature Granulocytes: 0 %
Lymphocytes Relative: 10 %
Lymphs Abs: 0.7 10*3/uL (ref 0.7–4.0)
MCH: 34.6 pg — ABNORMAL HIGH (ref 26.0–34.0)
MCHC: 33.5 g/dL (ref 30.0–36.0)
MCV: 103.3 fL — ABNORMAL HIGH (ref 80.0–100.0)
Monocytes Absolute: 0.3 10*3/uL (ref 0.1–1.0)
Monocytes Relative: 4 %
Neutro Abs: 5.4 10*3/uL (ref 1.7–7.7)
Neutrophils Relative %: 86 %
Platelets: 148 10*3/uL — ABNORMAL LOW (ref 150–400)
RBC: 4.19 MIL/uL (ref 3.87–5.11)
RDW: 12.8 % (ref 11.5–15.5)
WBC: 6.3 10*3/uL (ref 4.0–10.5)
nRBC: 0 % (ref 0.0–0.2)

## 2021-02-09 LAB — COMPREHENSIVE METABOLIC PANEL
ALT: 14 U/L (ref 0–44)
AST: 22 U/L (ref 15–41)
Albumin: 3.8 g/dL (ref 3.5–5.0)
Alkaline Phosphatase: 56 U/L (ref 38–126)
Anion gap: 9 (ref 5–15)
BUN: 12 mg/dL (ref 8–23)
CO2: 22 mmol/L (ref 22–32)
Calcium: 8.9 mg/dL (ref 8.9–10.3)
Chloride: 103 mmol/L (ref 98–111)
Creatinine, Ser: 0.57 mg/dL (ref 0.44–1.00)
GFR, Estimated: >60 mL/min (ref >60)
Glucose, Bld: 106 mg/dL — ABNORMAL HIGH (ref 70–99)
Potassium: 3.8 mmol/L (ref 3.5–5.1)
Sodium: 134 mmol/L — ABNORMAL LOW (ref 135–145)
Total Bilirubin: 1.8 mg/dL — ABNORMAL HIGH (ref 0.3–1.2)
Total Protein: 6.7 g/dL (ref 6.5–8.1)

## 2021-02-09 LAB — TROPONIN I (HIGH SENSITIVITY)
Troponin I (High Sensitivity): 6 ng/L (ref <18)
Troponin I (High Sensitivity): 7 ng/L (ref <18)

## 2021-02-09 LAB — RESP PANEL BY RT-PCR (FLU A&B, COVID) ARPGX2
Influenza A by PCR: NEGATIVE
Influenza B by PCR: NEGATIVE
SARS Coronavirus 2 by RT PCR: NEGATIVE

## 2021-02-09 LAB — LACTIC ACID, PLASMA
Lactic Acid, Venous: 1.2 mmol/L (ref 0.5–1.9)
Lactic Acid, Venous: 0.9 mmol/L (ref 0.5–1.9)

## 2021-02-09 LAB — LIPASE, BLOOD: Lipase: 22 U/L (ref 11–51)

## 2021-02-09 MED ORDER — POTASSIUM CHLORIDE 10 MEQ/100ML IV SOLN
10.0000 meq | INTRAVENOUS | Status: AC
  Administered 2021-02-09 – 2021-02-10 (×2): 10 meq via INTRAVENOUS
  Filled 2021-02-09 (×2): qty 100

## 2021-02-09 MED ORDER — LACTATED RINGERS IV SOLN: INTRAVENOUS | Status: AC

## 2021-02-09 MED ORDER — ACETAMINOPHEN 650 MG RE SUPP: 650.0000 mg | Freq: Four times a day (QID) | RECTAL | Status: DC | PRN

## 2021-02-09 MED ORDER — ONDANSETRON HCL 4 MG PO TABS: 4.0000 mg | ORAL_TABLET | Freq: Four times a day (QID) | ORAL | Status: DC | PRN

## 2021-02-09 MED ORDER — ONDANSETRON HCL 4 MG/2ML IJ SOLN
4.0000 mg | Freq: Four times a day (QID) | INTRAMUSCULAR | Status: DC | PRN
  Administered 2021-02-10 – 2021-02-11 (×2): 4 mg via INTRAVENOUS
  Filled 2021-02-09 (×2): qty 2

## 2021-02-09 MED ORDER — ACETAMINOPHEN 325 MG PO TABS
650.0000 mg | ORAL_TABLET | Freq: Four times a day (QID) | ORAL | Status: DC | PRN
  Administered 2021-02-09 – 2021-02-10 (×2): 650 mg via ORAL
  Filled 2021-02-09 (×2): qty 2

## 2021-02-09 MED ORDER — MAGNESIUM SULFATE 2 GM/50ML IV SOLN
2.0000 g | Freq: Once | INTRAVENOUS | Status: AC
  Administered 2021-02-10: 03:00:00 2 g via INTRAVENOUS
  Filled 2021-02-09: qty 50

## 2021-02-09 MED ORDER — SODIUM CHLORIDE 0.9 % IV SOLN
500.0000 mg | Freq: Once | INTRAVENOUS | Status: DC
  Filled 2021-02-09: qty 5

## 2021-02-09 MED ORDER — SODIUM CHLORIDE 0.9 % IV BOLUS
1000.0000 mL | Freq: Once | INTRAVENOUS | Status: AC
  Administered 2021-02-09: 18:00:00 1000 mL via INTRAVENOUS

## 2021-02-09 MED ORDER — ONDANSETRON HCL 4 MG/2ML IJ SOLN
4.0000 mg | Freq: Once | INTRAMUSCULAR | Status: AC
  Administered 2021-02-09: 18:00:00 4 mg via INTRAVENOUS
  Filled 2021-02-09: qty 2

## 2021-02-09 MED ORDER — SODIUM CHLORIDE 0.9 % IV SOLN
1.0000 g | Freq: Once | INTRAVENOUS | Status: DC
  Filled 2021-02-09: qty 10

## 2021-02-09 MED ORDER — PANTOPRAZOLE SODIUM 40 MG IV SOLR
40.0000 mg | Freq: Two times a day (BID) | INTRAVENOUS | Status: DC
  Administered 2021-02-10 – 2021-02-11 (×4): 40 mg via INTRAVENOUS
  Filled 2021-02-09 (×4): qty 40

## 2021-02-09 NOTE — Assessment & Plan Note (Signed)
Continue statin. 

## 2021-02-09 NOTE — Progress Notes (Signed)
MD Imogene Burn notified about patient complaining of chest pain, 4 out of 10, PRN Tylenol was given. She also wants her home medication ordered.

## 2021-02-09 NOTE — Assessment & Plan Note (Signed)
Stable

## 2021-02-09 NOTE — H&P (Signed)
History and Physical    Charlotte Powell [id number] DOB: 1928-09-22 DOA: 02/09/2021  PCP: System, Provider Not In   Patient coming from: [facility]  I have personally briefly reviewed patient's old medical records in [place]  Chief complaint: Nausea, vomiting, diarrhea History of present illness 87 year old female with a history of A. fib, coronary disease, hyperlipidemia, hypertension who lives at [facility] assisted living, presents to the ER today with a 1 day history of nausea, vomiting, diarrhea.  Patient states that she went to bed last night feeling okay.  She got up in the middle night and had an episode of diarrhea along with vomiting.  She continued to have nausea and vomiting today.  Daughter saw her this afternoon.  Daughter brought her to the ER.  Daughter is unclear whether or not she has had a fever.  Patient is unclear if there is any widespread viral illness at her assisted living facility. On admission, temperature was not taken, heart rate 87 blood pressure 145/76.  94% on room air.  Labs showed sodium 134, potassium 3.8, bicarbonate 22, BUN of 12, creatinine 0.57  white count 6.3, hemoglobin 14.5 platelets 148  Lactic acid 1.2 lipase normal at 22  COVID and flu swabs were negative.  Chest x-ray showed some small left effusion with atelectasis versus pneumonia.  Should be noted that during her October 2022 admission she had a chest x-ray that had the similar results of left basilar opacity which could represent atelectasis, aspiration or pneumonia.  Due to concern for possible pneumonia, [facility] hospitalist contacted for admission.   ED Course: Temperature not recorded in the ER.  Blood pressure normal.  Not hypoxic. No leukocytosis.  Review of Systems:  Review of Systems  Constitutional:  Positive for malaise/fatigue. Negative for chills and fever.  HENT: Negative.    Eyes: Negative.   Respiratory: Negative.    Cardiovascular:  Negative.   Gastrointestinal:  Positive for diarrhea, nausea and vomiting.  Genitourinary: Negative.   Musculoskeletal: Negative.   Skin: Negative.   Neurological:  Positive for weakness.  Endo/Heme/Allergies: Negative.   Psychiatric/Behavioral: Negative.    All other systems reviewed and are negative.  Past Medical History:  Diagnosis Date   CAD (coronary artery disease)    Diverticulitis    GERD (gastroesophageal reflux disease)    HLD (hyperlipidemia)    HTN (hypertension)    Hypothyroidism     Past Surgical History:  Procedure Laterality Date   ANKLE FRACTURE SURGERY     CHOLECYSTECTOMY     CORONARY STENT PLACEMENT     FEMUR FRACTURE SURGERY     WRIST FRACTURE SURGERY       reports that she has never smoked. She has never used smokeless tobacco. She reports that she does not drink alcohol and does not use drugs.  Allergies  Allergen Reactions   Amoxicillin    Hydrocodone     Confusion and AMS    Family History  Problem Relation Age of Onset   Stomach cancer Neg Hx     Prior to Admission medications   Medication Sig Start Date End Date Taking? Authorizing Provider  acetaminophen (TYLENOL) 325 MG tablet Take 650 mg by mouth every 8 (eight) hours as needed (for pain).    [provider]  acetaminophen (TYLENOL) 500 MG tablet Take 1,000 mg by mouth at bedtime.    [provider]  apixaban (ELIQUIS) 2.5 MG TABS tablet Take 1 tablet (2.5 mg total) by mouth 2 (two)  times daily. 11/30/20   Ghimire, Werner Lean, MD  cefdinir (OMNICEF) 300 MG capsule Take 1 capsule (300 mg total) by mouth 2 (two) times daily. 11/30/20   Ghimire, Werner Lean, MD  Cholecalciferol 25 MCG (1000 UT) capsule Take 1,000 Units by mouth daily.    [provider]  diclofenac Sodium (VOLTAREN) 1 % GEL Apply 4 g topically in the morning and at bedtime. Apply to right side of neck 10/23/20   [provider]  docusate sodium (COLACE) 100 MG capsule Take 100 mg by mouth  every other day.    [provider]  famotidine (PEPCID) 20 MG tablet Take 20 mg by mouth daily. 11/01/20   [provider]  fluticasone (FLONASE) 50 MCG/ACT nasal spray Place 1 spray into both nostrils 2 (two) times daily as needed for allergies. 08/07/20   [provider]  folic acid (FOLVITE) 800 MCG tablet Take 800 mcg by mouth daily.    [provider]  gabapentin (NEURONTIN) 100 MG capsule Take 100 mg by mouth at bedtime.    [provider]  levothyroxine (SYNTHROID) 25 MCG tablet Take 25 mcg by mouth daily before breakfast.    [provider]  nitroGLYCERIN (NITROSTAT) 0.4 MG SL tablet Place 0.4 mg under the tongue every 5 (five) minutes as needed for chest pain. 08/07/20   [provider]  pravastatin (PRAVACHOL) 80 MG tablet Take 80 mg by mouth at bedtime. 10/25/20   [provider]    Physical Exam: Vitals:   02/09/21 1830 02/09/21 1915 02/09/21 2000 02/09/21 2045  BP: (!) 144/71 139/67 136/69 (!) 142/68  Pulse: 91 91 99 100  Resp: (!) 26 (!) 25 (!) 25 (!) 22  SpO2: 91% 92% 91% 92%    Physical Exam Vitals and nursing note reviewed.  Constitutional:      General: She is not in acute distress.    Appearance: Normal appearance. She is normal weight. She is not ill-appearing, toxic-appearing or diaphoretic.  HENT:     Head: Normocephalic and atraumatic.     Nose: Nose normal. No rhinorrhea.  Eyes:     General:        Right eye: No discharge.        Left eye: No discharge.  Cardiovascular:     Rate and Rhythm: Tachycardia present. Rhythm irregular.     Pulses: Normal pulses.  Pulmonary:     Effort: Pulmonary effort is normal. No respiratory distress.     Breath sounds: Normal breath sounds. No wheezing or rales.  Abdominal:     General: Abdomen is flat. Bowel sounds are normal. There is no distension.     Palpations: Abdomen is soft.     Tenderness: There is no abdominal tenderness. There is no guarding or  rebound.  Musculoskeletal:     Right lower leg: No edema.     Left lower leg: No edema.  Skin:    General: Skin is warm and dry.     Capillary Refill: Capillary refill takes less than 2 seconds.  Neurological:     General: No focal deficit present.     Mental Status: She is alert and oriented to person, place, and time.     Labs on Admission: I have personally reviewed following labs and imaging studies  CBC: Recent Labs  Lab 02/09/21 1810  WBC 6.3  NEUTROABS 5.4  HGB 14.5  HCT 43.3  MCV 103.3*  PLT 148*   Basic Metabolic Panel: Recent Labs  Lab 02/09/21 1810  NA 134*  K 3.8  CL 103  CO2 22  GLUCOSE 106*  BUN 12  CREATININE 0.57  CALCIUM 8.9   GFR: CrCl cannot be calculated (Unknown ideal weight.). Liver Function Tests: Recent Labs  Lab 02/09/21 1810  AST 22  ALT 14  ALKPHOS 56  BILITOT 1.8*  PROT 6.7  ALBUMIN 3.8   Recent Labs  Lab 02/09/21 1810  LIPASE 22   No results for input(s): AMMONIA in the last 168 hours. Coagulation Profile: No results for input(s): INR, PROTIME in the last 168 hours. Cardiac Enzymes: No results for input(s): CKTOTAL, CKMB, CKMBINDEX, TROPONINI in the last 168 hours. BNP (last 3 results) No results for input(s): PROBNP in the last 8760 hours. HbA1C: No results for input(s): HGBA1C in the last 72 hours. CBG: No results for input(s): GLUCAP in the last 168 hours. Lipid Profile: No results for input(s): CHOL, HDL, LDLCALC, TRIG, CHOLHDL, LDLDIRECT in the last 72 hours. Thyroid Function Tests: No results for input(s): TSH, T4TOTAL, FREET4, T3FREE, THYROIDAB in the last 72 hours. Anemia Panel: No results for input(s): VITAMINB12, FOLATE, FERRITIN, TIBC, IRON, RETICCTPCT in the last 72 hours. Urine analysis:    Component Value Date/Time   COLORURINE STRAW (A) 11/27/2020 1226   APPEARANCEUR CLEAR 11/27/2020 1226   LABSPEC 1.018 11/27/2020 1226   PHURINE 8.0 11/27/2020 1226   GLUCOSEU NEGATIVE 11/27/2020 1226   HGBUR  NEGATIVE 11/27/2020 [place] 11/27/2020 [place] 11/27/2020 1226   PROTEINUR NEGATIVE 11/27/2020 1226   NITRITE NEGATIVE 11/27/2020 1226   LEUKOCYTESUR NEGATIVE 11/27/2020 1226    Radiological Exams on Admission: I have personally reviewed images DG Chest Portable 1 View  Result Date: 02/09/2021 CLINICAL DATA:  Cough fatigue EXAM: PORTABLE CHEST 1 VIEW COMPARISON:  11/27/2020 FINDINGS: Right lung grossly clear. Mild cardiomegaly with aortic atherosclerosis. Small left effusion with airspace disease at the left base. No pneumothorax. Advanced degenerative changes of the left shoulder. IMPRESSION: 1. Small left pleural effusion with left basilar airspace disease, atelectasis versus pneumonia. 2. Mild cardiomegaly Electronically Signed   By: Donavan Foil M.D.   On: 02/09/2021 17:34    EKG: I have personally reviewed EKG: afib   Assessment/Plan Principal Problem:   Viral gastroenteritis Active Problems:   A-fib (HCC)   HTN (hypertension)   DNR (do not resuscitate)/DNI(Do Not Intubate)   HLD (hyperlipidemia)   CAD (coronary artery disease)    Viral gastroenteritis Admit to observation. Check GI viral panel and C. Diff panel.  Not convinced she actually has pneumonia.  Will check CT without contrast to verify any infiltrates in her left lower lung base.  Patient without leukocytosis.  Patient without hypoxia.  Patient without fever.  We will continue IV fluids.  A-fib (HCC) Stable.  In rate controlled afib.  Continue Eliquis.  CAD (coronary artery disease) Stable.  On statin.  DNR (do not resuscitate)/DNI(Do Not Intubate) Verified DNR/DNI status with her daughter [name].      HLD (hyperlipidemia) Continue statin.  HTN (hypertension) Stable.  DVT prophylaxis: Eliquis Code Status: DNR/DNI(Do NOT Intubate) verified DNR/DNI with dtr sandra Family Communication: discussed with pt and dtr sandra at bed side Disposition Plan: return to ALF   Consults called: none  Admission status: Observation, Med-Surg   Kristopher Oppenheim, DO [facility] 02/09/2021, 9:35 PM

## 2021-02-09 NOTE — Assessment & Plan Note (Signed)
Stable.  On statin. 

## 2021-02-09 NOTE — Assessment & Plan Note (Signed)
Admit to observation. Check GI viral panel and C. Diff panel.  Not convinced she actually has pneumonia.  Will check CT without contrast to verify any infiltrates in her left lower lung base.  Patient without leukocytosis.  Patient without hypoxia.  Patient without fever.  We will continue IV fluids.

## 2021-02-09 NOTE — Progress Notes (Signed)
No pneumonia on CT. Small bilateral pleural effusion. No indications for ABX.  Hx of afib. Will check echo. Check bnp.

## 2021-02-09 NOTE — Assessment & Plan Note (Addendum)
Stable.  In rate controlled afib.  Continue Eliquis.

## 2021-02-09 NOTE — Subjective & Objective (Signed)
Chief complaint: Nausea, vomiting, diarrhea History of present illness 87 year old female with a history of A. fib, coronary disease, hyperlipidemia, hypertension who lives at [facility] assisted living, presents to the ER today with a 1 day history of nausea, vomiting, diarrhea.  Patient states that she went to bed last night feeling okay.  She got up in the middle night and had an episode of diarrhea along with vomiting.  She continued to have nausea and vomiting today.  Daughter saw her this afternoon.  Daughter brought her to the ER.  Daughter is unclear whether or not she has had a fever.  Patient is unclear if there is any widespread viral illness at her assisted living facility. On admission, temperature was not taken, heart rate 87 blood pressure 145/76.  94% on room air.  Labs showed sodium 134, potassium 3.8, bicarbonate 22, BUN of 12, creatinine 0.57  white count 6.3, hemoglobin 14.5 platelets 148  Lactic acid 1.2 lipase normal at 22  COVID and flu swabs were negative.  Chest x-ray showed some small left effusion with atelectasis versus pneumonia.  Should be noted that during her October 2022 admission she had a chest x-ray that had the similar results of left basilar opacity which could represent atelectasis, aspiration or pneumonia.  Due to concern for possible pneumonia, [facility] hospitalist contacted for admission.

## 2021-02-09 NOTE — ED Provider Notes (Signed)
[facility] DEPT Provider Note   CSN: [id number] Arrival date & time: 02/09/21  1631     History No chief complaint on file.   Echoe Powell is a 87 y.o. female.  The history is provided by the patient and medical records. No language interpreter was used.  Emesis Severity:  Moderate Duration:  1 day Timing:  Intermittent Quality:  Stomach contents Progression:  Unchanged Chronicity:  New Relieved by:  Nothing Worsened by:  Nothing Ineffective treatments:  None tried Associated symptoms: chills, cough, diarrhea and URI   Associated symptoms: no abdominal pain, no fever and no headaches       Past Medical History:  Diagnosis Date   CAD (coronary artery disease)    Diverticulitis    GERD (gastroesophageal reflux disease)    HLD (hyperlipidemia)    HTN (hypertension)    Hypothyroidism     Patient Active Problem List   Diagnosis Date Noted   Encephalopathy acute 11/28/2020   Encephalopathy 11/27/2020   Community acquired pneumonia 11/27/2020   Gastroenteritis 11/16/2020   Nausea & vomiting 11/16/2020   A-fib ([place]) 11/16/2020    Past Surgical History:  Procedure Laterality Date   ANKLE FRACTURE SURGERY     CHOLECYSTECTOMY     CORONARY STENT PLACEMENT     FEMUR FRACTURE SURGERY     WRIST FRACTURE SURGERY       OB History   No obstetric history on file.     Family History  Problem Relation Age of Onset   Stomach cancer Neg Hx     Social History   Tobacco Use   Smoking status: Never   Smokeless tobacco: Never  Substance Use Topics   Alcohol use: Never   Drug use: Never    Home Medications Prior to Admission medications   Medication Sig Start Date End Date Taking? Authorizing Provider  acetaminophen (TYLENOL) 325 MG tablet Take 650 mg by mouth every 8 (eight) hours as needed (for pain).    [provider]  acetaminophen (TYLENOL) 500 MG tablet Take 1,000 mg by mouth at bedtime.    [provider]  apixaban (ELIQUIS) 2.5 MG TABS tablet Take 1 tablet (2.5 mg total) by mouth 2 (two) times daily. 11/30/20   Ghimire, Henreitta Leber, MD  cefdinir (OMNICEF) 300 MG capsule Take 1 capsule (300 mg total) by mouth 2 (two) times daily. 11/30/20   Ghimire, Henreitta Leber, MD  Cholecalciferol 25 MCG (1000 UT) capsule Take 1,000 Units by mouth daily.    [provider]  diclofenac Sodium (VOLTAREN) 1 % GEL Apply 4 g topically in the morning and at bedtime. Apply to right side of neck 10/23/20   [provider]  docusate sodium (COLACE) 100 MG capsule Take 100 mg by mouth every other day.    [provider]  famotidine (PEPCID) 20 MG tablet Take 20 mg by mouth daily. 11/01/20   [provider]  fluticasone (FLONASE) 50 MCG/ACT nasal spray Place 1 spray into both nostrils 2 (two) times daily as needed for allergies. 08/07/20   [provider]  folic acid (FOLVITE) [id number] MCG tablet Take 800 mcg by mouth daily.    [provider]  gabapentin (NEURONTIN) 100 MG capsule Take 100 mg by mouth at bedtime.    [provider]  levothyroxine (SYNTHROID) 25 MCG tablet Take 25 mcg by mouth daily before breakfast.    [provider]  nitroGLYCERIN (NITROSTAT) 0.4 MG SL tablet Place 0.4 mg under  the tongue every 5 (five) minutes as needed for chest pain. 08/07/20   [provider]  pravastatin (PRAVACHOL) 80 MG tablet Take 80 mg by mouth at bedtime. 10/25/20   [provider]    Allergies    Amoxicillin and Hydrocodone  Review of Systems   Review of Systems  Constitutional:  Positive for chills and fatigue. Negative for fever.  HENT:  Positive for congestion.   Eyes:  Negative for visual disturbance.  Respiratory:  Positive for cough. Negative for chest tightness, shortness of breath and wheezing.   Cardiovascular:  Negative for chest pain, palpitations and leg swelling.  Gastrointestinal:  Positive for diarrhea, nausea and vomiting.  Negative for abdominal pain and constipation.  Genitourinary:  Positive for decreased urine volume. Negative for dysuria and frequency.  Musculoskeletal:  Negative for back pain, neck pain and neck stiffness.  Skin:  Negative for rash and wound.  Neurological:  Negative for dizziness, light-headedness and headaches.  Psychiatric/Behavioral:  Negative for agitation and confusion.   All other systems reviewed and are negative.  Physical Exam Updated Vital Signs BP (!) 142/68    Pulse 100    Resp (!) 22    SpO2 92%   Physical Exam Vitals and nursing note reviewed.  Constitutional:      General: She is not in acute distress.    Appearance: She is well-developed. She is not ill-appearing, toxic-appearing or diaphoretic.  HENT:     Head: Normocephalic and atraumatic.     Mouth/Throat:     Mouth: Mucous membranes are dry.     Pharynx: No oropharyngeal exudate or posterior oropharyngeal erythema.  Eyes:     Extraocular Movements: Extraocular movements intact.     Conjunctiva/sclera: Conjunctivae normal.     Pupils: Pupils are equal, round, and reactive to light.  Cardiovascular:     Rate and Rhythm: Normal rate and regular rhythm.     Heart sounds: No murmur heard. Pulmonary:     Effort: Pulmonary effort is normal. No respiratory distress.     Breath sounds: Normal breath sounds. No wheezing, rhonchi or rales.  Chest:     Chest wall: No tenderness.  Abdominal:     General: Abdomen is flat.     Palpations: Abdomen is soft.     Tenderness: There is no abdominal tenderness. There is no right CVA tenderness, left CVA tenderness, guarding or rebound.  Musculoskeletal:        General: No swelling or tenderness.     Cervical back: Neck supple. No tenderness.     Right lower leg: No edema.     Left lower leg: No edema.  Skin:    General: Skin is warm and dry.     Capillary Refill: Capillary refill takes less than 2 seconds.     Findings: No erythema.  Neurological:     General: No  focal deficit present.     Mental Status: She is alert.  Psychiatric:        Mood and Affect: Mood normal.    ED Results / Procedures / Treatments   Labs (all labs ordered are listed, but only abnormal results are displayed) Labs Reviewed  CBC WITH DIFFERENTIAL/PLATELET - Abnormal; Notable for the following components:      Result Value   MCV 103.3 (*)    MCH 34.6 (*)    Platelets 148 (*)    All other components within normal limits  COMPREHENSIVE METABOLIC PANEL - Abnormal; Notable for the following components:  Sodium 134 (*)    Glucose, Bld 106 (*)    Total Bilirubin 1.8 (*)    All other components within normal limits  RESP PANEL BY RT-PCR (FLU A&B, COVID) [id number]  URINE CULTURE  GASTROINTESTINAL PANEL BY PCR, STOOL (REPLACES STOOL CULTURE)  C DIFFICILE QUICK SCREEN W PCR REFLEX    LIPASE, BLOOD  LACTIC ACID, PLASMA  LACTIC ACID, PLASMA  URINALYSIS, ROUTINE W REFLEX MICROSCOPIC  BRAIN NATRIURETIC PEPTIDE  TROPONIN I (HIGH SENSITIVITY)    EKG EKG Interpretation  Date/Time:  Saturday February 09 2021 21:06:25 EST Ventricular Rate:  95 PR Interval:    QRS Duration: 87 QT Interval:  290 QTC Calculation: 365 R Axis:   103 Text Interpretation: Atrial fibrillation Right axis deviation Nonspecific T abnormalities, diffuse leads When compared to prior, now aflutter/fib. No STEMI Confirmed by Antony Blackbird [phone]) on 02/09/2021 9:10:40 PM  Radiology DG Chest Portable 1 View  Result Date: 02/09/2021 CLINICAL DATA:  Cough fatigue EXAM: PORTABLE CHEST 1 VIEW COMPARISON:  11/27/2020 FINDINGS: Right lung grossly clear. Mild cardiomegaly with aortic atherosclerosis. Small left effusion with airspace disease at the left base. No pneumothorax. Advanced degenerative changes of the left shoulder. IMPRESSION: 1. Small left pleural effusion with left basilar airspace disease, atelectasis versus pneumonia. 2. Mild cardiomegaly Electronically Signed   By: Donavan Foil M.D.   On:  02/09/2021 17:34    Procedures Procedures   Medications Ordered in ED Medications  cefTRIAXone (ROCEPHIN) 1 g in sodium chloride 0.9 % 100 mL IVPB (has no administration in time range)  azithromycin (ZITHROMAX) 500 mg in sodium chloride 0.9 % 250 mL IVPB (has no administration in time range)  sodium chloride 0.9 % bolus 1,000 mL (1,000 mLs Intravenous New Bag/Given 02/09/21 1814)  ondansetron (ZOFRAN) injection 4 mg (4 mg Intravenous Given 02/09/21 1815)    ED Course  I have reviewed the triage vital signs and the nursing notes.  Pertinent labs & imaging results that were available during my care of the patient were reviewed by me and considered in my medical decision making (see chart for details).    MDM Rules/Calculators/A&P                          Hazel Saffel is a 87 y.o. female with a past medical history significant for CAD with prior PCI, hypertension, hyperlipidemia, hypothyroidism, diverticulitis, previous cholecystectomy, atrial fibrillation on Eliquis therapy, and recent stroke/admission for encephalopathy 2 months ago who presents with nausea, vomiting, diarrhea, fatigue, and cough.  According to patient, for the last few days she has had some sinus congestion and some cough that is occasionally productive.  She reports she has not had any shortness breath or chest pain but today started having nausea vomiting, diarrhea.  She reports feeling fatigued and occasionally has some chills but denies any fevers.  She says her urine seems to be decreased but denies dysuria.  She did not comment about any blood in her emesis or diarrhea and EMS reports that she was given some Imodium without significant relief.  She denies any headache and only report some chronic pains in her neck and back from arthritis.  Denies any neurologic complaints.  On exam, lungs had some faint rhonchi but no rales or wheezing.  Chest and abdomen nontender.  No tenderness anywhere in the abdomen and normal  bowel sounds.  Patient has dry mucous membranes and legs are not edematous.  Clinical aspect patient has a viral  syndrome such as gastroenteritis or even COVID/flu.  We will get some screening labs to look for significant dehydration or electrode abnormality but will also check for COVID/flu.  We will also get a chest x-ray with her cough and rhonchi given her age.  We will check urinalysis with decreased urine however given her lack of any abdominal pain or tenderness, low suspicion for recurrent diverticulitis or other intra-abdominal pathology needing imaging today.  If she develops discomfort, would consider imaging but will hold at this time.  We will give some fluids and nausea medicine as she does not appear fluid overloaded at this time.  Patient is agreement with rehydration, nausea medication, and some screening testing.  Patient agrees that if work-up is reassuring she hopes to go home.  Anticipate reassessment after work-up.   8:46 PM Work-up continues to return.  Patient's COVID and flu were negative however patient's x-ray does show evidence of pneumonia given the patient's productive cough and fatigue.  She also reports she was having more chest tightness now, will add on troponin and EKG.  Otherwise her electrolytes were similar to prior and she is not septic.  Lipase not elevated.  She reports she has still been unable to urinate and she is still getting some fluids.  Patient's port score calculated as 100 for her pneumonia with a mortality between 8.2 9.3%.  Thus we do feel she needs admission for IV antibiotics especially given that she is having nausea and vomiting and is not tolerating p.o. well.  Will call for admission for pneumonia.  Hopefully patient will provide a urine sample when she is more hydrated.  9:00 PM Admitting team requested a noncontrasted chest CT to further evaluate the lungs which was reasonable.  Family informed of this and will get the image.   Final  Clinical Impression(s) / ED Diagnoses Final diagnoses:  Nausea and vomiting, unspecified vomiting type  Diarrhea, unspecified type  Dehydration  Community acquired pneumonia, unspecified laterality     Clinical Impression: 1. Nausea and vomiting, unspecified vomiting type   2. Diarrhea, unspecified type   3. Dehydration   4. Community acquired pneumonia, unspecified laterality     Disposition: Admit  This note was prepared with assistance of Systems analyst. Occasional wrong-word or sound-a-like substitutions may have occurred due to the inherent limitations of voice recognition software.     Kadarius Cuffe, Gwenyth Allegra, MD 02/09/21 2153

## 2021-02-09 NOTE — Progress Notes (Addendum)
NP Blount was notified about patient complaining about chest pain 4/10, she said she's had the pain for the last 2 hours before coming to this floor. No new orders given at the moment.

## 2021-02-09 NOTE — ED Triage Notes (Signed)
Patient BIBA from [facility]. C/O N/V and diarrhea since this AM. Patient reports taking Immodium to treat diarrhea.   [employer] w/walker Negative home Covid test this AM  BP: 124/76 HR: 76 SPO2: 95% RA CBG: 118

## 2021-02-09 NOTE — Assessment & Plan Note (Addendum)
Verified DNR/DNI status with her daughter [name].

## 2021-02-10 ENCOUNTER — Observation Stay (HOSPITAL_BASED_OUTPATIENT_CLINIC_OR_DEPARTMENT_OTHER): Payer: Medicare HMO

## 2021-02-10 ENCOUNTER — Encounter (HOSPITAL_COMMUNITY): Payer: Self-pay | Admitting: Internal Medicine

## 2021-02-10 DIAGNOSIS — I482 Chronic atrial fibrillation, unspecified: Secondary | ICD-10-CM | POA: Diagnosis not present

## 2021-02-10 DIAGNOSIS — Z66 Do not resuscitate: Secondary | ICD-10-CM | POA: Diagnosis not present

## 2021-02-10 DIAGNOSIS — I2510 Atherosclerotic heart disease of native coronary artery without angina pectoris: Secondary | ICD-10-CM | POA: Diagnosis not present

## 2021-02-10 DIAGNOSIS — A084 Viral intestinal infection, unspecified: Secondary | ICD-10-CM | POA: Diagnosis not present

## 2021-02-10 DIAGNOSIS — I4891 Unspecified atrial fibrillation: Secondary | ICD-10-CM | POA: Diagnosis not present

## 2021-02-10 LAB — COMPREHENSIVE METABOLIC PANEL
ALT: 14 U/L (ref 0–44)
AST: 18 U/L (ref 15–41)
Albumin: 3.5 g/dL (ref 3.5–5.0)
Alkaline Phosphatase: 48 U/L (ref 38–126)
Anion gap: 6 (ref 5–15)
BUN: 14 mg/dL (ref 8–23)
CO2: 23 mmol/L (ref 22–32)
Calcium: 8.6 mg/dL — ABNORMAL LOW (ref 8.9–10.3)
Chloride: 106 mmol/L (ref 98–111)
Creatinine, Ser: 0.67 mg/dL (ref 0.44–1.00)
GFR, Estimated: >60 mL/min (ref >60)
Glucose, Bld: 90 mg/dL (ref 70–99)
Potassium: 3.8 mmol/L (ref 3.5–5.1)
Sodium: 135 mmol/L (ref 135–145)
Total Bilirubin: 1.5 mg/dL — ABNORMAL HIGH (ref 0.3–1.2)
Total Protein: 6.1 g/dL — ABNORMAL LOW (ref 6.5–8.1)

## 2021-02-10 LAB — CBC WITH DIFFERENTIAL/PLATELET
Abs Immature Granulocytes: 0.01 10*3/uL (ref 0.00–0.07)
Basophils Absolute: 0 10*3/uL (ref 0.0–0.1)
Basophils Relative: 0 %
Eosinophils Absolute: 0 10*3/uL (ref 0.0–0.5)
Eosinophils Relative: 0 %
HCT: 42.4 % (ref 36.0–46.0)
Hemoglobin: 14.1 g/dL (ref 12.0–15.0)
Immature Granulocytes: 0 %
Lymphocytes Relative: 22 %
Lymphs Abs: 0.9 10*3/uL (ref 0.7–4.0)
MCH: 34.4 pg — ABNORMAL HIGH (ref 26.0–34.0)
MCHC: 33.3 g/dL (ref 30.0–36.0)
MCV: 103.4 fL — ABNORMAL HIGH (ref 80.0–100.0)
Monocytes Absolute: 0.3 10*3/uL (ref 0.1–1.0)
Monocytes Relative: 8 %
Neutro Abs: 2.7 10*3/uL (ref 1.7–7.7)
Neutrophils Relative %: 70 %
Platelets: 133 10*3/uL — ABNORMAL LOW (ref 150–400)
RBC: 4.1 MIL/uL (ref 3.87–5.11)
RDW: 13 % (ref 11.5–15.5)
WBC: 3.9 10*3/uL — ABNORMAL LOW (ref 4.0–10.5)
nRBC: 0 % (ref 0.0–0.2)

## 2021-02-10 LAB — BRAIN NATRIURETIC PEPTIDE: B Natriuretic Peptide: 356.9 pg/mL — ABNORMAL HIGH (ref 0.0–100.0)

## 2021-02-10 LAB — MAGNESIUM: Magnesium: 2 mg/dL (ref 1.7–2.4)

## 2021-02-10 LAB — C DIFFICILE QUICK SCREEN W PCR REFLEX
C Diff antigen: POSITIVE — AB
C Diff toxin: NEGATIVE

## 2021-02-10 LAB — ECHOCARDIOGRAM COMPLETE: S' Lateral: 2.3 cm

## 2021-02-10 LAB — PHOSPHORUS: Phosphorus: 2.7 mg/dL (ref 2.5–4.6)

## 2021-02-10 MED ORDER — LEVOTHYROXINE SODIUM 25 MCG PO TABS
25.0000 ug | ORAL_TABLET | Freq: Every day | ORAL | Status: DC
  Administered 2021-02-11 – 2021-02-12 (×2): 25 ug via ORAL
  Filled 2021-02-10 (×2): qty 1

## 2021-02-10 MED ORDER — NITROGLYCERIN 0.4 MG SL SUBL: 0.4000 mg | SUBLINGUAL_TABLET | SUBLINGUAL | Status: DC | PRN

## 2021-02-10 MED ORDER — DILTIAZEM HCL ER 60 MG PO CP12
60.0000 mg | ORAL_CAPSULE | Freq: Two times a day (BID) | ORAL | Status: DC
  Administered 2021-02-10 – 2021-02-12 (×4): 60 mg via ORAL
  Filled 2021-02-10 (×5): qty 1

## 2021-02-10 MED ORDER — PRAVASTATIN SODIUM 20 MG PO TABS
80.0000 mg | ORAL_TABLET | Freq: Every day | ORAL | Status: DC
  Administered 2021-02-11 – 2021-02-12 (×2): 80 mg via ORAL
  Filled 2021-02-10 (×2): qty 4

## 2021-02-10 MED ORDER — ENSURE ENLIVE PO LIQD
237.0000 mL | Freq: Two times a day (BID) | ORAL | Status: DC
  Administered 2021-02-10 – 2021-02-12 (×6): 237 mL via ORAL

## 2021-02-10 MED ORDER — SODIUM CHLORIDE 0.9 % IV SOLN: INTRAVENOUS | Status: AC

## 2021-02-10 MED ORDER — APIXABAN 2.5 MG PO TABS
2.5000 mg | ORAL_TABLET | Freq: Two times a day (BID) | ORAL | Status: DC
  Administered 2021-02-10 – 2021-02-12 (×5): 2.5 mg via ORAL
  Filled 2021-02-10 (×5): qty 1

## 2021-02-10 NOTE — Progress Notes (Signed)
PROGRESS NOTE    Charlotte Powell  ZOX:[id number] DOB: [birth year] DOA: 02/09/2021 PCP: System, Provider Not In   Brief Narrative:  Patient is a 87 year old Caucasian female with a past medical history significant for but not limited to atrial fibrillation, CAD, hyperlipidemia, hypertension as well as other comorbidities who recently relocated from [place] to an ALF at [facility] and presented to the ED with a 1 day history of nausea, vomiting, diarrhea.  She states that she went to bed last night feeling okay but in the middle the night she had episode of diarrhea along with vomiting.  She continued to have nausea vomiting throughout the day and then daughter saw her yesterday afternoon and daughter brought to the ED is unclear if she had a fever or not and is unclear if there is a widespread illness at her ALF.  Upon evaluation her vital signs are stable she did have a low sodium.  COVID and flu were negative and chest x-ray did show some small left effusions with atelectasis versus pneumonia.  Her admission back in October she had a chest x-ray that showed similar results of left basilar opacity that represents atelectasis or pneumonia.  Due to concern of possible pneumonia [facility] was contacted for admission and she underwent CT scan of the chest contrast which showed small bilateral pleural effusions with partial consolidation of the lower lobe favoring atelectasis and mild cardiomegaly with trace pericardial effusion.  She also had mild pulm mild probable chronic compression deformities of the lower thoracic and upper lumbar spine.  She continued to have some diarrhea and was initiated on IV fluids.  She had an echocardiogram for her atrial fibrillation and labs seem to have improved a little bit.  Pain PT OT evaluation for safe discharge disposition given her generalized weakness  Assessment & Plan:   Principal Problem:   Viral gastroenteritis Active Problems:    A-fib (HCC)   HTN (hypertension)   DNR (do not resuscitate)/DNI(Do Not Intubate)   HLD (hyperlipidemia)   CAD (coronary artery disease)  Acute nausea, vomiting and diarrhea in the setting of likely viral gastroenteritis -She was admitted to observation -GI pathogen panel is being checked and C. difficile has been ordered so she is placed on enteric precautions -We will initiate gentle IV fluid hydration with normal saline at 75 mils per hour -Not convinced that she has a pneumonia and CT of the chest likely represented lower lobe atelectasis with small bilateral pleural effusions -Patient is afebrile and has no leukocytosis and is not hypoxic -IV fluids are being continued and we will obtain PT OT to further evaluate and treat -We will continue with supportive care and antiemetics  Atrial fibrillation -She has rate controlled atrial fibrillation and is on anticoagulation with Eliquis which we have resumed -Echocardiogram done and showed a normal EF of 65 to 70% with indeterminate diastolic parameters due to her atrial fibrillation and borderline LVH -Resume diltiazem 60 mg p.o. twice daily -Continue to monitor on telemetry  CAD -Continue nitroglycerin 0.4 mg sublingually every 5 as needed for chest pain and resume her pravastatin 80 mg p.o. nightly  Hypothyroidism -Check TSH -Resume Levothyroxine 25 mcg p.o. daily  Hyperlipidemia -Continue with statin as above  Hyponatremia -Mild.  Patient's sodium went from 134 is now 135 -continue to monitor and trend and repeat CMP in a.m. agree with IV fluid hydration as above  Thrombocytopenia -Patient's platelet count went from 148 and is now 133 -Continue monitor and trend and  repeat CBC in the a.m.  Hyperbilirubinemia -Likely reactive and T bili went from 1.8 is now 1.5 -Continue monitor and trend and repeat CMP in a.m.  Hypertension -Continue monitor blood pressures per protocol -Resume her home diltiazem -Last blood pressure  reading was  DVT prophylaxis: Anticoagulant with apixaban Code Status: DO NOT RESUSCITATE Family Communication: Discussed with daughter at bedside Disposition Plan: Pending further clinical improvement and evaluation by PT and OT  Status is: Observation  The patient will require care spanning > 2 midnights and should be moved to inpatient because: Also need PT OT evaluation and improvement of her symptoms prior to safe discharge disposition  Consultants:  None  Procedures:  ECHOCARDIOGRAM IMPRESSIONS     1. Left ventricular ejection fraction, by estimation, is 65 to 70%. The  left ventricle has normal function. The left ventricle has no regional  wall motion abnormalities. Left ventricular diastolic parameters are  indeterminate.   2. Right ventricular systolic function is normal. The right ventricular  size is normal. There is normal pulmonary artery systolic pressure. The  estimated right ventricular systolic pressure is 34.6 mmHg.   3. Left atrial size was mildly dilated.   4. The mitral valve is abnormal. Moderate mitral valve regurgitation.   5. Tricuspid valve regurgitation is mild to moderate.   6. The aortic valve is tricuspid. Aortic valve regurgitation is not  visualized. Aortic valve sclerosis/calcification is present, without any  evidence of aortic stenosis.   7. The inferior vena cava is normal in size with <50% respiratory  variability, suggesting right atrial pressure of 8 mmHg.   8. Left pleural effusion present.   Comparison(s): No prior Echocardiogram.   FINDINGS   Left Ventricle: Left ventricular ejection fraction, by estimation, is 65  to 70%. The left ventricle has normal function. The left ventricle has no  regional wall motion abnormalities. The left ventricular internal cavity  size was normal in size. There is   borderline left ventricular hypertrophy. Left ventricular diastolic  function could not be evaluated due to atrial fibrillation. Left   ventricular diastolic parameters are indeterminate.   Right Ventricle: The right ventricular size is normal. No increase in  right ventricular wall thickness. Right ventricular systolic function is  normal. There is normal pulmonary artery systolic pressure. The tricuspid  regurgitant velocity is 2.58 m/s, and   with an assumed right atrial pressure of 8 mmHg, the estimated right  ventricular systolic pressure is 34.6 mmHg.   Left Atrium: Left atrial size was mildly dilated.   Right Atrium: Right atrial size was normal in size.   Pericardium: There is no evidence of pericardial effusion. Presence of  epicardial fat layer.   Mitral Valve: The mitral valve is abnormal. There is mild thickening of  the mitral valve leaflet(s). There is mild calcification of the mitral  valve leaflet(s). Mild mitral annular calcification. Moderate mitral valve  regurgitation, with  posteriorly-directed jet.   Tricuspid Valve: The tricuspid valve is grossly normal. Tricuspid valve  regurgitation is mild to moderate.   Aortic Valve: The aortic valve is tricuspid. There is mild aortic valve  annular calcification. Aortic valve regurgitation is not visualized.  Aortic valve sclerosis/calcification is present, without any evidence of  aortic stenosis.   Pulmonic Valve: The pulmonic valve was grossly normal. Pulmonic valve  regurgitation is trivial.   Aorta: The aortic root is normal in size and structure.   Venous: The inferior vena cava is normal in size with less than 50%  respiratory  variability, suggesting right atrial pressure of 8 mmHg.   IAS/Shunts: No atrial level shunt detected by color flow Doppler.   Additional Comments: There is pleural effusion in the left lateral region.      LEFT VENTRICLE  PLAX 2D  LVIDd:         3.90 cm  LVIDs:         2.30 cm  LV PW:         1.00 cm  LV IVS:        0.90 cm  LVOT diam:     1.70 cm  LV SV:         35  LV SV Index:   22  LVOT Area:     2.27  cm      RIGHT VENTRICLE  TAPSE (M-mode): 1.3 cm   LEFT ATRIUM             Index        RIGHT ATRIUM           Index  LA diam:        4.30 cm 2.71 cm/m   RA Area:     15.20 cm  LA Vol (A2C):   51.0 ml 32.13 ml/m  RA Volume:   33.90 ml  21.36 ml/m  LA Vol (A4C):   52.1 ml 32.83 ml/m  LA Biplane Vol: 51.7 ml 32.58 ml/m   AORTIC VALVE  LVOT Vmax:   80.00 cm/s  LVOT Vmean:  51.167 cm/s  LVOT VTI:    0.154 m     AORTA  Ao Root diam: 3.00 cm  Ao Asc diam:  2.90 cm   TRICUSPID VALVE  TR Peak grad:   26.6 mmHg  TR Vmax:        258.00 cm/s     SHUNTS  Systemic VTI:  0.15 m  Systemic Diam: 1.70 cm Antimicrobials:  Anti-infectives (From admission, onward)    Start     Dose/Rate Route Frequency Ordered Stop   02/09/21 2100  cefTRIAXone (ROCEPHIN) 1 g in sodium chloride 0.9 % 100 mL IVPB  Status:  Discontinued        1 g 200 mL/hr over 30 Minutes Intravenous  Once 02/09/21 2048 02/09/21 2140   02/09/21 2100  azithromycin (ZITHROMAX) 500 mg in sodium chloride 0.9 % 250 mL IVPB  Status:  Discontinued        500 mg 250 mL/hr over 60 Minutes Intravenous  Once 02/09/21 2048 02/09/21 2140        Subjective: Seen and examined at bedside and was still having some diarrhea.  Nausea vomiting is improved.  Feels weak.  Denies any chest pain or shortness of breath.  No other concerns or complaints at this time.  Objective: Vitals:   02/10/21 0147 02/10/21 0604 02/10/21 1047 02/10/21 1418  BP: 129/76 125/66 129/73 132/74  Pulse: 88 74 77 89  Resp: 16 16 15 14   Temp: 98.8 F (37.1 C) 98.7 F (37.1 C) 97.6 F (36.4 C) 98.2 F (36.8 C)  TempSrc: Oral Oral Oral Oral  SpO2: 93% 96% 95% 96%    Intake/Output Summary (Last 24 hours) at 02/10/2021 1735 Last data filed at 02/10/2021 1332 Gross per 24 hour  Intake 760.12 ml  Output 0 ml  Net 760.12 ml   There were no vitals filed for this visit.  Examination: Physical Exam:  Constitutional: Elderly Caucasian female currently  in no acute distress does appear fatigued Eyes: Lids and conjunctivae normal, sclerae  anicteric  ENMT: External Ears, Nose appear normal. Grossly normal hearing.  Neck: Appears normal, supple, no cervical masses, normal ROM, no appreciable thyromegaly; no appreciable JVD Respiratory: Diminished to auscultation bilaterally with coarse breath sounds at the bases, no wheezing, rales, rhonchi or crackles. Normal respiratory effort and patient is not tachypenic. No accessory muscle use.  Unlabored breathing and not wearing supplemental oxygen nasal cannula Cardiovascular: RRR, no murmurs / rubs / gallops. S1 and S2 auscultated.  Trace extremity edema Abdomen: Soft, non-tender, non-distended. Bowel sounds positive.  GU: Deferred. Musculoskeletal: No clubbing / cyanosis of digits/nails. No joint deformity upper and lower extremities. Skin: No rashes, lesions, ulcers on limited skin evaluation. No induration; Warm and dry.  Neurologic: CN 2-12 grossly intact with no focal deficits.  Romberg sign and cerebellar reflexes not assessed.  Psychiatric: Normal judgment and insight. Alert and oriented x 3. Normal mood and appropriate affect.   Data Reviewed: I have personally reviewed following labs and imaging studies  CBC: Recent Labs  Lab 02/09/21 1810 02/10/21 0956  WBC 6.3 3.9*  NEUTROABS 5.4 2.7  HGB 14.5 14.1  HCT 43.3 42.4  MCV 103.3* 103.4*  PLT 148* 133*   Basic Metabolic Panel: Recent Labs  Lab 02/09/21 1810 02/10/21 0956  NA 134* 135  K 3.8 3.8  CL 103 106  CO2 22 23  GLUCOSE 106* 90  BUN 12 14  CREATININE 0.57 0.67  CALCIUM 8.9 8.6*  MG  --  2.0  PHOS  --  2.7   GFR: CrCl cannot be calculated (Unknown ideal weight.). Liver Function Tests: Recent Labs  Lab 02/09/21 1810 02/10/21 0956  AST 22 18  ALT 14 14  ALKPHOS 56 48  BILITOT 1.8* 1.5*  PROT 6.7 6.1*  ALBUMIN 3.8 3.5   Recent Labs  Lab 02/09/21 1810  LIPASE 22   No results for input(s): AMMONIA in the last  168 hours. Coagulation Profile: No results for input(s): INR, PROTIME in the last 168 hours. Cardiac Enzymes: No results for input(s): CKTOTAL, CKMB, CKMBINDEX, TROPONINI in the last 168 hours. BNP (last 3 results) No results for input(s): PROBNP in the last 8760 hours. HbA1C: No results for input(s): HGBA1C in the last 72 hours. CBG: No results for input(s): GLUCAP in the last 168 hours. Lipid Profile: No results for input(s): CHOL, HDL, LDLCALC, TRIG, CHOLHDL, LDLDIRECT in the last 72 hours. Thyroid Function Tests: No results for input(s): TSH, T4TOTAL, FREET4, T3FREE, THYROIDAB in the last 72 hours. Anemia Panel: No results for input(s): VITAMINB12, FOLATE, FERRITIN, TIBC, IRON, RETICCTPCT in the last 72 hours. Sepsis Labs: Recent Labs  Lab 02/09/21 1810 02/09/21 1847  LATICACIDVEN 1.2 0.9    Recent Results (from the past 240 hour(s))  Resp Panel by RT-PCR (Flu A&B, Covid) Nasopharyngeal Swab     Status: None   Collection Time: 02/09/21  5:51 PM   Specimen: Nasopharyngeal Swab; Nasopharyngeal(NP) swabs in vial transport medium  Result Value Ref Range Status   SARS Coronavirus 2 by RT PCR NEGATIVE NEGATIVE Final    Comment: (NOTE) SARS-CoV-2 target nucleic acids are NOT DETECTED.  The SARS-CoV-2 RNA is generally detectable in upper respiratory specimens during the acute phase of infection. The lowest concentration of SARS-CoV-2 viral copies this assay can detect is 138 copies/mL. A negative result does not preclude SARS-Cov-2 infection and should not be used as the sole basis for treatment or other patient management decisions. A negative result may occur with  improper specimen collection/handling, submission of specimen other  than nasopharyngeal swab, presence of viral mutation(s) within the areas targeted by this assay, and inadequate number of viral copies(<138 copies/mL). A negative result must be combined with clinical observations, patient history, and  epidemiological information. The expected result is Negative.  Fact Sheet for Patients:  BloggerCourse.com  Fact Sheet for Healthcare Providers:  SeriousBroker.it  This test is no t yet approved or cleared by the [place] FDA and  has been authorized for detection and/or diagnosis of SARS-CoV-2 by FDA under an Emergency Use Authorization (EUA). This EUA will remain  in effect (meaning this test can be used) for the duration of the COVID-19 declaration under Section 564(b)(1) of the Act, 21 U.S.C.section 360bbb-3(b)(1), unless the authorization is terminated  or revoked sooner.       Influenza A by PCR NEGATIVE NEGATIVE Final   Influenza B by PCR NEGATIVE NEGATIVE Final    Comment: (NOTE) The Xpert Xpress SARS-CoV-2/FLU/RSV plus assay is intended as an aid in the diagnosis of influenza from Nasopharyngeal swab specimens and should not be used as a sole basis for treatment. Nasal washings and aspirates are unacceptable for Xpert Xpress SARS-CoV-2/FLU/RSV testing.  Fact Sheet for Patients: BloggerCourse.com  Fact Sheet for Healthcare Providers: SeriousBroker.it  This test is not yet approved or cleared by the [place] FDA and has been authorized for detection and/or diagnosis of SARS-CoV-2 by FDA under an Emergency Use Authorization (EUA). This EUA will remain in effect (meaning this test can be used) for the duration of the COVID-19 declaration under Section 564(b)(1) of the Act, 21 U.S.C. section 360bbb-3(b)(1), unless the authorization is terminated or revoked.  Performed at [facility], [address] [place], [place] [address]     RN Pressure Injury Documentation:     Estimated body mass index is 22.26 kg/m as calculated from the following:   Height as of 11/27/20:  (1.6 m).   Weight as of 11/30/20: 57 kg.  Malnutrition Type:    Malnutrition Characteristics:   Nutrition Interventions:    Radiology Studies: CT Chest Wo Contrast  Result Date: 02/09/2021 CLINICAL DATA:  Nausea vomiting diarrhea EXAM: CT CHEST WITHOUT CONTRAST TECHNIQUE: Multidetector CT imaging of the chest was performed following the standard protocol without IV contrast. COMPARISON:  Chest x-ray 02/09/2021 FINDINGS: Cardiovascular: Limited evaluation without intravenous contrast. Moderate aortic atherosclerosis. No aneurysm. Coronary vascular calcifications. Mild cardiomegaly. Trace pericardial effusion Mediastinum/Nodes: Midline trachea. No thyroid mass. Borderline subcarinal node measuring 10 mm. Esophagus within normal limits. Lungs/Pleura: Small bilateral pleural effusions. Partial consolidations in the lower lobes likely atelectasis. Upper Abdomen: No acute abnormality Musculoskeletal: Probable chronic superior endplate deformities at T11, T12, L1 and L2. IMPRESSION: 1. Small bilateral pleural effusions with partial consolidations at the lower lobes favored to represent atelectasis. Mild cardiomegaly with trace pericardial effusion. 2. Multiple mild probable chronic compression deformities of the lower thoracic and upper lumbar spine Aortic Atherosclerosis (ICD10-I70.0). Electronically Signed   By: Jasmine Pang M.D.   On: 02/09/2021 21:44   DG Chest Portable 1 View  Result Date: 02/09/2021 CLINICAL DATA:  Cough fatigue EXAM: PORTABLE CHEST 1 VIEW COMPARISON:  11/27/2020 FINDINGS: Right lung grossly clear. Mild cardiomegaly with aortic atherosclerosis. Small left effusion with airspace disease at the left base. No pneumothorax. Advanced degenerative changes of the left shoulder. IMPRESSION: 1. Small left pleural effusion with left basilar airspace disease, atelectasis versus pneumonia. 2. Mild cardiomegaly Electronically Signed   By: Jasmine Pang M.D.   On: 02/09/2021 17:34   ECHOCARDIOGRAM COMPLETE  Result Date: 02/10/2021    ECHOCARDIOGRAM REPORT    Patient Name:   Charlotte Powell Date of Exam: 02/10/2021 Medical Rec #:  [id number]       Height:       63.0 in Accession #:    [id number]      Weight:       125.7 lb Date of Birth:  12-01-1928       BSA:          1.587 m Patient Age:    89 years        BP:           125/66 mmHg Patient Gender: F               HR:           80 bpm. Exam Location:  Inpatient Procedure: 2D Echo, Cardiac Doppler and Color Doppler Indications:    I48.91* Unspeicified atrial fibrillation  History:        Patient has no prior history of Echocardiogram examinations.                 CAD; Risk Factors:Hypertension and Dyslipidemia.  Sonographer:    Eulah Pont RDCS Referring Phys: [id number] ERIC CHEN IMPRESSIONS  1. Left ventricular ejection fraction, by estimation, is 65 to 70%. The left ventricle has normal function. The left ventricle has no regional wall motion abnormalities. Left ventricular diastolic parameters are indeterminate.  2. Right ventricular systolic function is normal. The right ventricular size is normal. There is normal pulmonary artery systolic pressure. The estimated right ventricular systolic pressure is 34.6 mmHg.  3. Left atrial size was mildly dilated.  4. The mitral valve is abnormal. Moderate mitral valve regurgitation.  5. Tricuspid valve regurgitation is mild to moderate.  6. The aortic valve is tricuspid. Aortic valve regurgitation is not visualized. Aortic valve sclerosis/calcification is present, without any evidence of aortic stenosis.  7. The inferior vena cava is normal in size with <50% respiratory variability, suggesting right atrial pressure of 8 mmHg.  8. Left pleural effusion present. Comparison(s): No prior Echocardiogram. FINDINGS  Left Ventricle: Left ventricular ejection fraction, by estimation, is 65 to 70%. The left ventricle has normal function. The left ventricle has no regional wall motion abnormalities. The left ventricular internal cavity size was normal in size. There is  borderline left  ventricular hypertrophy. Left ventricular diastolic function could not be evaluated due to atrial fibrillation. Left ventricular diastolic parameters are indeterminate. Right Ventricle: The right ventricular size is normal. No increase in right ventricular wall thickness. Right ventricular systolic function is normal. There is normal pulmonary artery systolic pressure. The tricuspid regurgitant velocity is 2.58 m/s, and  with an assumed right atrial pressure of 8 mmHg, the estimated right ventricular systolic pressure is 34.6 mmHg. Left Atrium: Left atrial size was mildly dilated. Right Atrium: Right atrial size was normal in size. Pericardium: There is no evidence of pericardial effusion. Presence of epicardial fat layer. Mitral Valve: The mitral valve is abnormal. There is mild thickening of the mitral valve leaflet(s). There is mild calcification of the mitral valve leaflet(s). Mild mitral annular calcification. Moderate mitral valve regurgitation, with posteriorly-directed jet. Tricuspid Valve: The tricuspid valve is grossly normal. Tricuspid valve regurgitation is mild to moderate. Aortic Valve: The aortic valve is tricuspid. There is mild aortic valve annular calcification. Aortic valve regurgitation is not visualized. Aortic valve sclerosis/calcification is present, without any evidence of aortic stenosis. Pulmonic Valve: The pulmonic valve was grossly normal. Pulmonic  valve regurgitation is trivial. Aorta: The aortic root is normal in size and structure. Venous: The inferior vena cava is normal in size with less than 50% respiratory variability, suggesting right atrial pressure of 8 mmHg. IAS/Shunts: No atrial level shunt detected by color flow Doppler. Additional Comments: There is pleural effusion in the left lateral region.  LEFT VENTRICLE PLAX 2D LVIDd:         3.90 cm LVIDs:         2.30 cm LV PW:         1.00 cm LV IVS:        0.90 cm LVOT diam:     1.70 cm LV SV:         35 LV SV Index:   22 LVOT  Area:     2.27 cm  RIGHT VENTRICLE TAPSE (M-mode): 1.3 cm LEFT ATRIUM             Index        RIGHT ATRIUM           Index LA diam:        4.30 cm 2.71 cm/m   RA Area:     15.20 cm LA Vol (A2C):   51.0 ml 32.13 ml/m  RA Volume:   33.90 ml  21.36 ml/m LA Vol (A4C):   52.1 ml 32.83 ml/m LA Biplane Vol: 51.7 ml 32.58 ml/m  AORTIC VALVE LVOT Vmax:   80.00 cm/s LVOT Vmean:  51.167 cm/s LVOT VTI:    0.154 m  AORTA Ao Root diam: 3.00 cm Ao Asc diam:  2.90 cm TRICUSPID VALVE TR Peak grad:   26.6 mmHg TR Vmax:        258.00 cm/s  SHUNTS Systemic VTI:  0.15 m Systemic Diam: 1.70 cm Nona Dell MD Electronically signed by Nona Dell MD Signature Date/Time: 02/10/2021/12:56:10 PM    Final     Scheduled Meds:  apixaban  2.5 mg Oral BID   feeding supplement  237 mL Oral BID BM   pantoprazole (PROTONIX) IV  40 mg Intravenous Q12H   Continuous Infusions:  sodium chloride      LOS: 0 days   Merlene Laughter, DO [facility] PAGER is on AMION  If 7PM-7AM, please contact night-coverage [email or web address]

## 2021-02-10 NOTE — Progress Notes (Signed)
°  Echocardiogram 2D Echocardiogram has been performed.  Charlotte Powell 02/10/2021, 9:30 AM

## 2021-02-11 DIAGNOSIS — A0472 Enterocolitis due to Clostridium difficile, not specified as recurrent: Secondary | ICD-10-CM

## 2021-02-11 DIAGNOSIS — I2510 Atherosclerotic heart disease of native coronary artery without angina pectoris: Secondary | ICD-10-CM | POA: Diagnosis not present

## 2021-02-11 DIAGNOSIS — A084 Viral intestinal infection, unspecified: Secondary | ICD-10-CM | POA: Diagnosis not present

## 2021-02-11 DIAGNOSIS — E785 Hyperlipidemia, unspecified: Secondary | ICD-10-CM | POA: Diagnosis not present

## 2021-02-11 LAB — CBC WITH DIFFERENTIAL/PLATELET
Abs Immature Granulocytes: 0.01 10*3/uL (ref 0.00–0.07)
Basophils Absolute: 0 10*3/uL (ref 0.0–0.1)
Basophils Relative: 0 %
Eosinophils Absolute: 0 10*3/uL (ref 0.0–0.5)
Eosinophils Relative: 1 %
HCT: 40 % (ref 36.0–46.0)
Hemoglobin: 13.4 g/dL (ref 12.0–15.0)
Immature Granulocytes: 0 %
Lymphocytes Relative: 28 %
Lymphs Abs: 1.3 10*3/uL (ref 0.7–4.0)
MCH: 34.2 pg — ABNORMAL HIGH (ref 26.0–34.0)
MCHC: 33.5 g/dL (ref 30.0–36.0)
MCV: 102 fL — ABNORMAL HIGH (ref 80.0–100.0)
Monocytes Absolute: 0.4 10*3/uL (ref 0.1–1.0)
Monocytes Relative: 10 %
Neutro Abs: 2.7 10*3/uL (ref 1.7–7.7)
Neutrophils Relative %: 61 %
Platelets: 124 10*3/uL — ABNORMAL LOW (ref 150–400)
RBC: 3.92 MIL/uL (ref 3.87–5.11)
RDW: 12.9 % (ref 11.5–15.5)
WBC: 4.5 10*3/uL (ref 4.0–10.5)
nRBC: 0 % (ref 0.0–0.2)

## 2021-02-11 LAB — GASTROINTESTINAL PANEL BY PCR, STOOL (REPLACES STOOL CULTURE)

## 2021-02-11 LAB — COMPREHENSIVE METABOLIC PANEL
ALT: 10 U/L (ref 0–44)
AST: 17 U/L (ref 15–41)
Albumin: 3.3 g/dL — ABNORMAL LOW (ref 3.5–5.0)
Alkaline Phosphatase: 55 U/L (ref 38–126)
Anion gap: 6 (ref 5–15)
BUN: 13 mg/dL (ref 8–23)
CO2: 22 mmol/L (ref 22–32)
Calcium: 8.3 mg/dL — ABNORMAL LOW (ref 8.9–10.3)
Chloride: 107 mmol/L (ref 98–111)
Creatinine, Ser: 0.6 mg/dL (ref 0.44–1.00)
GFR, Estimated: >60 mL/min (ref >60)
Glucose, Bld: 108 mg/dL — ABNORMAL HIGH (ref 70–99)
Potassium: 3.1 mmol/L — ABNORMAL LOW (ref 3.5–5.1)
Sodium: 135 mmol/L (ref 135–145)
Total Bilirubin: 1.2 mg/dL (ref 0.3–1.2)
Total Protein: 6.1 g/dL — ABNORMAL LOW (ref 6.5–8.1)

## 2021-02-11 LAB — CLOSTRIDIUM DIFFICILE BY PCR, REFLEXED: Toxigenic C. Difficile by PCR: POSITIVE — AB

## 2021-02-11 LAB — URINALYSIS, ROUTINE W REFLEX MICROSCOPIC
Bacteria, UA: NONE SEEN
Bilirubin Urine: NEGATIVE
Glucose, UA: NEGATIVE mg/dL
Ketones, ur: NEGATIVE mg/dL
Leukocytes,Ua: NEGATIVE
Nitrite: NEGATIVE
Protein, ur: NEGATIVE mg/dL
Specific Gravity, Urine: 1.01 (ref 1.005–1.030)
pH: 6 (ref 5.0–8.0)

## 2021-02-11 LAB — MAGNESIUM: Magnesium: 1.6 mg/dL — ABNORMAL LOW (ref 1.7–2.4)

## 2021-02-11 LAB — PHOSPHORUS: Phosphorus: 2.4 mg/dL — ABNORMAL LOW (ref 2.5–4.6)

## 2021-02-11 MED ORDER — MAGNESIUM SULFATE 2 GM/50ML IV SOLN
2.0000 g | Freq: Once | INTRAVENOUS | Status: AC
  Administered 2021-02-11: 11:00:00 2 g via INTRAVENOUS
  Filled 2021-02-11: qty 50

## 2021-02-11 MED ORDER — K PHOS MONO-SOD PHOS DI & MONO 155-852-130 MG PO TABS
500.0000 mg | ORAL_TABLET | Freq: Once | ORAL | Status: AC
  Administered 2021-02-11: 11:00:00 500 mg via ORAL
  Filled 2021-02-11: qty 2

## 2021-02-11 MED ORDER — VANCOMYCIN HCL 125 MG PO CAPS
125.0000 mg | ORAL_CAPSULE | Freq: Four times a day (QID) | ORAL | Status: DC
  Administered 2021-02-11 – 2021-02-12 (×6): 125 mg via ORAL
  Filled 2021-02-11 (×12): qty 1

## 2021-02-11 MED ORDER — POTASSIUM CHLORIDE CRYS ER 20 MEQ PO TBCR
40.0000 meq | EXTENDED_RELEASE_TABLET | Freq: Two times a day (BID) | ORAL | Status: AC
  Administered 2021-02-11 (×2): 40 meq via ORAL
  Filled 2021-02-11 (×2): qty 2

## 2021-02-11 MED ORDER — SACCHAROMYCES BOULARDII 250 MG PO CAPS
250.0000 mg | ORAL_CAPSULE | Freq: Two times a day (BID) | ORAL | Status: DC
  Administered 2021-02-11 – 2021-02-12 (×3): 250 mg via ORAL
  Filled 2021-02-11 (×3): qty 1

## 2021-02-11 NOTE — Progress Notes (Signed)
PROGRESS NOTE    Marycatherine Picaso  [id number] DOB: [birth year] DOA: 02/09/2021 PCP: System, Provider Not In   Brief Narrative:  Patient is a 87 year old Caucasian female with a past medical history significant for but not limited to atrial fibrillation, CAD, hyperlipidemia, hypertension as well as other comorbidities who recently relocated from [place] to an ALF at [facility] and presented to the ED with a 1 day history of nausea, vomiting, diarrhea.  She states that she went to bed last night feeling okay but in the middle the night she had episode of diarrhea along with vomiting.  She continued to have nausea vomiting throughout the day and then daughter saw her yesterday afternoon and daughter brought to the ED is unclear if she had a fever or not and is unclear if there is a widespread illness at her ALF.  Upon evaluation her vital signs are stable she did have a low sodium.  COVID and flu were negative and chest x-ray did show some small left effusions with atelectasis versus pneumonia.  Her admission back in October she had a chest x-ray that showed similar results of left basilar opacity that represents atelectasis or pneumonia.  Due to concern of possible pneumonia [facility] hospitalist was contacted for admission and she underwent CT scan of the chest contrast which showed small bilateral pleural effusions with partial consolidation of the lower lobe favoring atelectasis and mild cardiomegaly with trace pericardial effusion.  She also had mild pulm mild probable chronic compression deformities of the lower thoracic and upper lumbar spine.  She continued to have some diarrhea and was initiated on IV fluids and further testing revealed that she had norovirus and C. difficile.  She had an echocardiogram for her atrial fibrillation and labs seem to have improved a little bit.  Pain PT OT evaluation for safe discharge disposition given her generalized weakness  Assessment & Plan:    Principal Problem:   Viral gastroenteritis Active Problems:   A-fib (HCC)   HTN (hypertension)   DNR (do not resuscitate)/DNI(Do Not Intubate)   HLD (hyperlipidemia)   CAD (coronary artery disease)  Acute nausea, vomiting and diarrhea in the setting of likely viral gastroenteritis from norovirus complicated by C. difficile -She was admitted to observation and continue monitor -GI pathogen panel was positive for norovirus and C. difficile was checked and positive for antigen and PCR was positive for toxigenic C. difficile -Initiated gentle IV fluid hydration with normal saline at 75 mils per hour and will continue -Continue supportive care and will add probiotics and oral vancomycin -Not convinced that she has a pneumonia and CT of the chest likely represented lower lobe atelectasis with small bilateral pleural effusions -Patient is afebrile and has no leukocytosis and is not hypoxic and overall looks stable -IV fluids are being continued and we will obtain PT OT to further evaluate and treat and they do not recommend any follow-up -We will continue with supportive care and antiemetics  Atrial fibrillation -She has rate controlled atrial fibrillation and is on anticoagulation with Eliquis which we have resumed -Transfer to telemetry given that she had elevated heart rates today -Echocardiogram done and showed a normal EF of 65 to 70% with indeterminate diastolic parameters due to her atrial fibrillation and borderline LVH -Resume diltiazem 60 mg p.o. twice daily -Continue to monitor on telemetry  CAD -Continue nitroglycerin 0.4 mg sublingually every 5 as needed for chest pain and resume her pravastatin 80 mg p.o. nightly  Hypokalemia -Patient's potassium was  3.1 -Replete with p.o. potassium chloride 40 mg twice daily x2 doses -Also replete with p.o. K-Phos Neutral 500 mg x 1 -Mag level is low so we will replete as below -Continue to monitor and repeat CMP in  a.m.  Hypomagnesemia -Mag level of 1.6 -Replete with IV mag sulfate 2 g -Continue monitor and replete as necessary -Repeat magnesium level in a.m.  Hypophosphatemia -Patient's phosphorus level was 2.4 -Replete with p.o. K-Phos Neutral 500 mg x 1 -Continue monitor replete as necessary -Repeat phosphorus level in a.m.  Hypothyroidism -Check TSH in the a.m. -Resume Levothyroxine 25 mcg p.o. daily  Hyperlipidemia -Continue with statin as above  Hyponatremia -Mild.  Patient's sodium went from 134 is now 135 x2 -continue to monitor and trend and repeat CMP in a.m. agree with IV fluid hydration as above  Thrombocytopenia -Patient's platelet count went from 148 and is now 133 -Continue monitor and trend and repeat CBC in the a.m.  Hyperbilirubinemia -Likely reactive and T bili went from 1.8 is now 1.5 and further improved to 1.2 -Continue monitor and trend and repeat CMP in a.m.  Hypertension -Continue monitor blood pressures per protocol -Resume her home diltiazem -Last blood pressure reading was 145/73  DVT prophylaxis: Anticoagulant with apixaban Code Status: DO NOT RESUSCITATE Family Communication: Discussed with daughter at bedside Disposition Plan: Pending further clinical improvement and evaluation by PT and OT  Status is: Observation  The patient will require care spanning > 2 midnights and should be moved to inpatient because: Also need PT OT evaluation and improvement of her symptoms prior to safe discharge disposition  Consultants:  None  Procedures:  ECHOCARDIOGRAM IMPRESSIONS     1. Left ventricular ejection fraction, by estimation, is 65 to 70%. The  left ventricle has normal function. The left ventricle has no regional  wall motion abnormalities. Left ventricular diastolic parameters are  indeterminate.   2. Right ventricular systolic function is normal. The right ventricular  size is normal. There is normal pulmonary artery systolic pressure. The   estimated right ventricular systolic pressure is Q000111Q mmHg.   3. Left atrial size was mildly dilated.   4. The mitral valve is abnormal. Moderate mitral valve regurgitation.   5. Tricuspid valve regurgitation is mild to moderate.   6. The aortic valve is tricuspid. Aortic valve regurgitation is not  visualized. Aortic valve sclerosis/calcification is present, without any  evidence of aortic stenosis.   7. The inferior vena cava is normal in size with <50% respiratory  variability, suggesting right atrial pressure of 8 mmHg.   8. Left pleural effusion present.   Comparison(s): No prior Echocardiogram.   FINDINGS   Left Ventricle: Left ventricular ejection fraction, by estimation, is 65  to 70%. The left ventricle has normal function. The left ventricle has no  regional wall motion abnormalities. The left ventricular internal cavity  size was normal in size. There is   borderline left ventricular hypertrophy. Left ventricular diastolic  function could not be evaluated due to atrial fibrillation. Left  ventricular diastolic parameters are indeterminate.   Right Ventricle: The right ventricular size is normal. No increase in  right ventricular wall thickness. Right ventricular systolic function is  normal. There is normal pulmonary artery systolic pressure. The tricuspid  regurgitant velocity is 2.58 m/s, and   with an assumed right atrial pressure of 8 mmHg, the estimated right  ventricular systolic pressure is Q000111Q mmHg.   Left Atrium: Left atrial size was mildly dilated.   Right Atrium: Right  atrial size was normal in size.   Pericardium: There is no evidence of pericardial effusion. Presence of  epicardial fat layer.   Mitral Valve: The mitral valve is abnormal. There is mild thickening of  the mitral valve leaflet(s). There is mild calcification of the mitral  valve leaflet(s). Mild mitral annular calcification. Moderate mitral valve  regurgitation, with   posteriorly-directed jet.   Tricuspid Valve: The tricuspid valve is grossly normal. Tricuspid valve  regurgitation is mild to moderate.   Aortic Valve: The aortic valve is tricuspid. There is mild aortic valve  annular calcification. Aortic valve regurgitation is not visualized.  Aortic valve sclerosis/calcification is present, without any evidence of  aortic stenosis.   Pulmonic Valve: The pulmonic valve was grossly normal. Pulmonic valve  regurgitation is trivial.   Aorta: The aortic root is normal in size and structure.   Venous: The inferior vena cava is normal in size with less than 50%  respiratory variability, suggesting right atrial pressure of 8 mmHg.   IAS/Shunts: No atrial level shunt detected by color flow Doppler.   Additional Comments: There is pleural effusion in the left lateral region.      LEFT VENTRICLE  PLAX 2D  LVIDd:         3.90 cm  LVIDs:         2.30 cm  LV PW:         1.00 cm  LV IVS:        0.90 cm  LVOT diam:     1.70 cm  LV SV:         35  LV SV Index:   22  LVOT Area:     2.27 cm      RIGHT VENTRICLE  TAPSE (M-mode): 1.3 cm   LEFT ATRIUM             Index        RIGHT ATRIUM           Index  LA diam:        4.30 cm 2.71 cm/m   RA Area:     15.20 cm  LA Vol (A2C):   51.0 ml 32.13 ml/m  RA Volume:   33.90 ml  21.36 ml/m  LA Vol (A4C):   52.1 ml 32.83 ml/m  LA Biplane Vol: 51.7 ml 32.58 ml/m   AORTIC VALVE  LVOT Vmax:   80.00 cm/s  LVOT Vmean:  51.167 cm/s  LVOT VTI:    0.154 m     AORTA  Ao Root diam: 3.00 cm  Ao Asc diam:  2.90 cm   TRICUSPID VALVE  TR Peak grad:   26.6 mmHg  TR Vmax:        258.00 cm/s     SHUNTS  Systemic VTI:  0.15 m  Systemic Diam: 1.70 cm Antimicrobials:  Anti-infectives (From admission, onward)    Start     Dose/Rate Route Frequency Ordered Stop   02/11/21 1200  vancomycin (VANCOCIN) capsule 125 mg        125 mg Oral 4 times daily 02/11/21 1011 02/21/21 1359   02/09/21 2100  cefTRIAXone  (ROCEPHIN) 1 g in sodium chloride 0.9 % 100 mL IVPB  Status:  Discontinued        1 g 200 mL/hr over 30 Minutes Intravenous  Once 02/09/21 2048 02/09/21 2140   02/09/21 2100  azithromycin (ZITHROMAX) 500 mg in sodium chloride 0.9 % 250 mL IVPB  Status:  Discontinued  500 mg 250 mL/hr over 60 Minutes Intravenous  Once 02/09/21 2048 02/09/21 2140        Subjective: Seen and examined at bedside and states that she vomited last night and had a very watery bowel movement earlier this morning.  She tested positive for norovirus and C. difficile so we will treat her for both and continue with supportive care.  Electrolytes are off and she is overall is feeling little bit better today.  Ambulated well with PT and OT.  No other concerns or complaints at this time.  Objective: Vitals:   02/11/21 0557 02/11/21 1145 02/11/21 1346 02/11/21 1738  BP: (!) 147/90  139/77 (!) 145/73  Pulse: (!) 101 86 68 64  Resp:   16 18  Temp: 98.6 F (37 C)  (!) 97.5 F (36.4 C) 98.6 F (37 C)  TempSrc: Oral  Oral Oral  SpO2: 96%  94% 94%    Intake/Output Summary (Last 24 hours) at 02/11/2021 1836 Last data filed at 02/11/2021 0606 Gross per 24 hour  Intake 1206.55 ml  Output 750 ml  Net 456.55 ml    There were no vitals filed for this visit.  Examination: Physical Exam:  Constitutional: WN/WD Caucasian female sitting in chair bedside appears calm and comfortable Eyes: Lids and conjunctivae normal, sclerae anicteric  ENMT: External Ears, Nose appear normal. Grossly normal hearing. Mucous membranes are moist. Neck: Appears normal, supple, no cervical masses, normal ROM, no appreciable thyromegaly; no appreciable JVD Respiratory: Diminished to auscultation bilaterally, no wheezing, rales, rhonchi or crackles. Normal respiratory effort and patient is not tachypenic. No accessory muscle use.  Unlabored breathing Cardiovascular: RRR, no murmurs / rubs / gallops. S1 and S2 auscultated.  Mild lower  extremity edema Abdomen: Soft, non-tender, non-distended. Bowel sounds positive.  GU: Deferred. Musculoskeletal: No clubbing / cyanosis of digits/nails. No joint deformity upper and lower extremities.  Skin: No rashes, lesions, ulcers on limited skin evaluation. No induration; Warm and dry.  Neurologic: CN 2-12 grossly intact with no focal deficits. Romberg sign and cerebellar reflexes not assessed.  Psychiatric: Normal judgment and insight. Alert and oriented x 3. Normal mood and appropriate affect.   Data Reviewed: I have personally reviewed following labs and imaging studies  CBC: Recent Labs  Lab 02/09/21 1810 02/10/21 0956 02/11/21 0908  WBC 6.3 3.9* 4.5  NEUTROABS 5.4 2.7 2.7  HGB 14.5 14.1 13.4  HCT 43.3 42.4 40.0  MCV 103.3* 103.4* 102.0*  PLT 148* 133* 124*    Basic Metabolic Panel: Recent Labs  Lab 02/09/21 1810 02/10/21 0956 02/11/21 0908  NA 134* 135 135  K 3.8 3.8 3.1*  CL 103 106 107  CO2 22 23 22   GLUCOSE 106* 90 108*  BUN 12 14 13   CREATININE 0.57 0.67 0.60  CALCIUM 8.9 8.6* 8.3*  MG  --  2.0 1.6*  PHOS  --  2.7 2.4*    GFR: CrCl cannot be calculated (Unknown ideal weight.). Liver Function Tests: Recent Labs  Lab 02/09/21 1810 02/10/21 0956 02/11/21 0908  AST 22 18 17   ALT 14 14 10   ALKPHOS 56 48 55  BILITOT 1.8* 1.5* 1.2  PROT 6.7 6.1* 6.1*  ALBUMIN 3.8 3.5 3.3*    Recent Labs  Lab 02/09/21 1810  LIPASE 22    No results for input(s): AMMONIA in the last 168 hours. Coagulation Profile: No results for input(s): INR, PROTIME in the last 168 hours. Cardiac Enzymes: No results for input(s): CKTOTAL, CKMB, CKMBINDEX, TROPONINI in the last  168 hours. BNP (last 3 results) No results for input(s): PROBNP in the last 8760 hours. HbA1C: No results for input(s): HGBA1C in the last 72 hours. CBG: No results for input(s): GLUCAP in the last 168 hours. Lipid Profile: No results for input(s): CHOL, HDL, LDLCALC, TRIG, CHOLHDL, LDLDIRECT in  the last 72 hours. Thyroid Function Tests: No results for input(s): TSH, T4TOTAL, FREET4, T3FREE, THYROIDAB in the last 72 hours. Anemia Panel: No results for input(s): VITAMINB12, FOLATE, FERRITIN, TIBC, IRON, RETICCTPCT in the last 72 hours. Sepsis Labs: Recent Labs  Lab 02/09/21 1810 02/09/21 1847  LATICACIDVEN 1.2 0.9     Recent Results (from the past 240 hour(s))  Resp Panel by RT-PCR (Flu A&B, Covid) Nasopharyngeal Swab     Status: None   Collection Time: 02/09/21  5:51 PM   Specimen: Nasopharyngeal Swab; Nasopharyngeal(NP) swabs in vial transport medium  Result Value Ref Range Status   SARS Coronavirus 2 by RT PCR NEGATIVE NEGATIVE Final    Comment: (NOTE) SARS-CoV-2 target nucleic acids are NOT DETECTED.  The SARS-CoV-2 RNA is generally detectable in upper respiratory specimens during the acute phase of infection. The lowest concentration of SARS-CoV-2 viral copies this assay can detect is 138 copies/mL. A negative result does not preclude SARS-Cov-2 infection and should not be used as the sole basis for treatment or other patient management decisions. A negative result may occur with  improper specimen collection/handling, submission of specimen other than nasopharyngeal swab, presence of viral mutation(s) within the areas targeted by this assay, and inadequate number of viral copies(<138 copies/mL). A negative result must be combined with clinical observations, patient history, and epidemiological information. The expected result is Negative.  Fact Sheet for Patients:  EntrepreneurPulse.com.au  Fact Sheet for Healthcare Providers:  IncredibleEmployment.be  This test is no t yet approved or cleared by the [place] FDA and  has been authorized for detection and/or diagnosis of SARS-CoV-2 by FDA under an Emergency Use Authorization (EUA). This EUA will remain  in effect (meaning this test can be used) for the duration of  the COVID-19 declaration under Section 564(b)(1) of the Act, 21 U.S.C.section 360bbb-3(b)(1), unless the authorization is terminated  or revoked sooner.       Influenza A by PCR NEGATIVE NEGATIVE Final   Influenza B by PCR NEGATIVE NEGATIVE Final    Comment: (NOTE) The Xpert Xpress SARS-CoV-2/FLU/RSV plus assay is intended as an aid in the diagnosis of influenza from Nasopharyngeal swab specimens and should not be used as a sole basis for treatment. Nasal washings and aspirates are unacceptable for Xpert Xpress SARS-CoV-2/FLU/RSV testing.  Fact Sheet for Patients: EntrepreneurPulse.com.au  Fact Sheet for Healthcare Providers: IncredibleEmployment.be  This test is not yet approved or cleared by the [place] FDA and has been authorized for detection and/or diagnosis of SARS-CoV-2 by FDA under an Emergency Use Authorization (EUA). This EUA will remain in effect (meaning this test can be used) for the duration of the COVID-19 declaration under Section 564(b)(1) of the Act, 21 U.S.C. section 360bbb-3(b)(1), unless the authorization is terminated or revoked.  Performed at [facility], [place] [address], [place], [place] [address]   Gastrointestinal Panel by PCR , Stool     Status: Abnormal   Collection Time: 02/09/21  7:32 PM   Specimen: Stool  Result Value Ref Range Status   Campylobacter species NOT DETECTED NOT DETECTED Final   Plesimonas shigelloides NOT DETECTED NOT DETECTED Final   Salmonella species NOT DETECTED NOT DETECTED Final  Yersinia enterocolitica NOT DETECTED NOT DETECTED Final   Vibrio species NOT DETECTED NOT DETECTED Final   Vibrio cholerae NOT DETECTED NOT DETECTED Final   Enteroaggregative E coli (EAEC) NOT DETECTED NOT DETECTED Final   Enteropathogenic E coli (EPEC) NOT DETECTED NOT DETECTED Final   Enterotoxigenic E coli (ETEC) NOT DETECTED NOT DETECTED Final   Shiga like toxin producing E coli  (STEC) NOT DETECTED NOT DETECTED Final   Shigella/Enteroinvasive E coli (EIEC) NOT DETECTED NOT DETECTED Final   Cryptosporidium NOT DETECTED NOT DETECTED Final   Cyclospora cayetanensis NOT DETECTED NOT DETECTED Final   Entamoeba histolytica NOT DETECTED NOT DETECTED Final   Giardia lamblia NOT DETECTED NOT DETECTED Final   Adenovirus F40/41 NOT DETECTED NOT DETECTED Final   Astrovirus NOT DETECTED NOT DETECTED Final   Norovirus GI/GII DETECTED (A) NOT DETECTED Final    Comment: RESULT CALLED TO, READ BACK BY AND VERIFIED WITH: CHINAZA ACHUSIM 02/11/21 1449 AMK    Rotavirus A NOT DETECTED NOT DETECTED Final   Sapovirus (I, II, IV, and V) NOT DETECTED NOT DETECTED Final    Comment: Performed at [facility], [address] [place], [place] [address]  C Difficile Quick Screen w PCR reflex     Status: Abnormal   Collection Time: 02/09/21  7:32 PM   Specimen: Stool  Result Value Ref Range Status   C Diff antigen POSITIVE (A) NEGATIVE Final   C Diff toxin NEGATIVE NEGATIVE Final   C Diff interpretation Results are indeterminate. See PCR results.  Final    Comment: Performed at [facility], [place] [address], [place], [place] [address]  C. Diff by PCR, Reflexed     Status: Abnormal   Collection Time: 02/09/21  7:32 PM  Result Value Ref Range Status   Toxigenic C. Difficile by PCR POSITIVE (A) NEGATIVE Final    Comment: Positive for toxigenic C. difficile with little to no toxin production. Only treat if clinical presentation suggests symptomatic illness. Performed at [facility], [place] [address] [place], [place] [address]      RN Pressure Injury Documentation:     Estimated body mass index is 22.26 kg/m as calculated from the following:   Height as of 11/27/20: 5\' 3"  (1.6 m).   Weight as of 11/30/20: 57 kg.  Malnutrition Type:   Malnutrition Characteristics:   Nutrition Interventions:    Radiology Studies: CT Chest Wo Contrast  Result  Date: 02/09/2021 CLINICAL DATA:  Nausea vomiting diarrhea EXAM: CT CHEST WITHOUT CONTRAST TECHNIQUE: Multidetector CT imaging of the chest was performed following the standard protocol without IV contrast. COMPARISON:  Chest x-ray 02/09/2021 FINDINGS: Cardiovascular: Limited evaluation without intravenous contrast. Moderate aortic atherosclerosis. No aneurysm. Coronary vascular calcifications. Mild cardiomegaly. Trace pericardial effusion Mediastinum/Nodes: Midline trachea. No thyroid mass. Borderline subcarinal node measuring 10 mm. Esophagus within normal limits. Lungs/Pleura: Small bilateral pleural effusions. Partial consolidations in the lower lobes likely atelectasis. Upper Abdomen: No acute abnormality Musculoskeletal: Probable chronic superior endplate deformities at T11, T12, L1 and L2. IMPRESSION: 1. Small bilateral pleural effusions with partial consolidations at the lower lobes favored to represent atelectasis. Mild cardiomegaly with trace pericardial effusion. 2. Multiple mild probable chronic compression deformities of the lower thoracic and upper lumbar spine Aortic Atherosclerosis (ICD10-I70.0). Electronically Signed   By: Donavan Foil M.D.   On: 02/09/2021 21:44   ECHOCARDIOGRAM COMPLETE  Result Date: 02/10/2021    ECHOCARDIOGRAM REPORT   Patient Name:   ALEXCIS POSADAS Date of Exam: 02/10/2021  Medical Rec #:  [id number]       Height:       63.0 in Accession #:    [id number]      Weight:       125.7 lb Date of Birth:  09-04-1928       BSA:          1.587 m Patient Age:    89 years        BP:           125/66 mmHg Patient Gender: F               HR:           80 bpm. Exam Location:  Inpatient Procedure: 2D Echo, Cardiac Doppler and Color Doppler Indications:    I48.91* Unspeicified atrial fibrillation  History:        Patient has no prior history of Echocardiogram examinations.                 CAD; Risk Factors:Hypertension and Dyslipidemia.  Sonographer:    Eulah Pont RDCS Referring Phys:  [id number] ERIC CHEN IMPRESSIONS  1. Left ventricular ejection fraction, by estimation, is 65 to 70%. The left ventricle has normal function. The left ventricle has no regional wall motion abnormalities. Left ventricular diastolic parameters are indeterminate.  2. Right ventricular systolic function is normal. The right ventricular size is normal. There is normal pulmonary artery systolic pressure. The estimated right ventricular systolic pressure is 34.6 mmHg.  3. Left atrial size was mildly dilated.  4. The mitral valve is abnormal. Moderate mitral valve regurgitation.  5. Tricuspid valve regurgitation is mild to moderate.  6. The aortic valve is tricuspid. Aortic valve regurgitation is not visualized. Aortic valve sclerosis/calcification is present, without any evidence of aortic stenosis.  7. The inferior vena cava is normal in size with <50% respiratory variability, suggesting right atrial pressure of 8 mmHg.  8. Left pleural effusion present. Comparison(s): No prior Echocardiogram. FINDINGS  Left Ventricle: Left ventricular ejection fraction, by estimation, is 65 to 70%. The left ventricle has normal function. The left ventricle has no regional wall motion abnormalities. The left ventricular internal cavity size was normal in size. There is  borderline left ventricular hypertrophy. Left ventricular diastolic function could not be evaluated due to atrial fibrillation. Left ventricular diastolic parameters are indeterminate. Right Ventricle: The right ventricular size is normal. No increase in right ventricular wall thickness. Right ventricular systolic function is normal. There is normal pulmonary artery systolic pressure. The tricuspid regurgitant velocity is 2.58 m/s, and  with an assumed right atrial pressure of 8 mmHg, the estimated right ventricular systolic pressure is 34.6 mmHg. Left Atrium: Left atrial size was mildly dilated. Right Atrium: Right atrial size was normal in size. Pericardium: There is no  evidence of pericardial effusion. Presence of epicardial fat layer. Mitral Valve: The mitral valve is abnormal. There is mild thickening of the mitral valve leaflet(s). There is mild calcification of the mitral valve leaflet(s). Mild mitral annular calcification. Moderate mitral valve regurgitation, with posteriorly-directed jet. Tricuspid Valve: The tricuspid valve is grossly normal. Tricuspid valve regurgitation is mild to moderate. Aortic Valve: The aortic valve is tricuspid. There is mild aortic valve annular calcification. Aortic valve regurgitation is not visualized. Aortic valve sclerosis/calcification is present, without any evidence of aortic stenosis. Pulmonic Valve: The pulmonic valve was grossly normal. Pulmonic valve regurgitation is trivial. Aorta: The aortic root is normal in size and structure. Venous: The inferior vena cava is  normal in size with less than 50% respiratory variability, suggesting right atrial pressure of 8 mmHg. IAS/Shunts: No atrial level shunt detected by color flow Doppler. Additional Comments: There is pleural effusion in the left lateral region.  LEFT VENTRICLE PLAX 2D LVIDd:         3.90 cm LVIDs:         2.30 cm LV PW:         1.00 cm LV IVS:        0.90 cm LVOT diam:     1.70 cm LV SV:         35 LV SV Index:   22 LVOT Area:     2.27 cm  RIGHT VENTRICLE TAPSE (M-mode): 1.3 cm LEFT ATRIUM             Index        RIGHT ATRIUM           Index LA diam:        4.30 cm 2.71 cm/m   RA Area:     15.20 cm LA Vol (A2C):   51.0 ml 32.13 ml/m  RA Volume:   33.90 ml  21.36 ml/m LA Vol (A4C):   52.1 ml 32.83 ml/m LA Biplane Vol: 51.7 ml 32.58 ml/m  AORTIC VALVE LVOT Vmax:   80.00 cm/s LVOT Vmean:  51.167 cm/s LVOT VTI:    0.154 m  AORTA Ao Root diam: 3.00 cm Ao Asc diam:  2.90 cm TRICUSPID VALVE TR Peak grad:   26.6 mmHg TR Vmax:        258.00 cm/s  SHUNTS Systemic VTI:  0.15 m Systemic Diam: 1.70 cm Rozann Lesches MD Electronically signed by Rozann Lesches MD Signature  Date/Time: 02/10/2021/12:56:10 PM    Final     Scheduled Meds:  apixaban  2.5 mg Oral BID   diltiazem  60 mg Oral BID   feeding supplement  237 mL Oral BID BM   levothyroxine  25 mcg Oral QAC breakfast   potassium chloride  40 mEq Oral BID   pravastatin  80 mg Oral q1800   saccharomyces boulardii  250 mg Oral BID   vancomycin  125 mg Oral QID   Continuous Infusions:    LOS: 0 days   Kerney Elbe, DO [facility] PAGER is on AMION  If 7PM-7AM, please contact night-coverage [email or web address]

## 2021-02-11 NOTE — Progress Notes (Signed)
Noticed pt's pulse rate irregular and tachy. Pt. Has history of afib. Provider notified, plan to put the pt in telemetry unit.

## 2021-02-11 NOTE — Progress Notes (Signed)
Lab tech. called for pt's stool tested positive for Norovirus. MD notified.

## 2021-02-11 NOTE — Plan of Care (Signed)
  Problem: Clinical Measurements: Goal: Ability to maintain clinical measurements within normal limits will improve Outcome: Progressing   Problem: Activity: Goal: Risk for activity intolerance will decrease Outcome: Progressing   Problem: Nutrition: Goal: Adequate nutrition will be maintained Outcome: Progressing   Problem: Elimination: Goal: Will not experience complications related to bowel motility Outcome: Progressing   

## 2021-02-11 NOTE — Progress Notes (Signed)
Transition of Care [facility]) Screening Note  Patient Details  Name: Charlotte Powell Date of Birth: 10/23/1928  Transition of Care [facility]) CM/SW Contact:    Ewing Schlein, LCSW Phone Number: 02/11/2021, 10:55 AM  Transition of Care Department [facility]) has reviewed patient and no TOC needs have been identified at this time. We will continue to monitor patient advancement through interdisciplinary progression rounds. If new patient transition needs arise, please place a TOC consult.

## 2021-02-11 NOTE — Evaluation (Signed)
Occupational Therapy Evaluation Patient Details Name: Charlotte Powell MRN: [id number] DOB: [birth year] Today's Date: 02/11/2021   History of Present Illness 87 y.o. F Admitted on 10/11 due to aphasia and confusion, CT negative. Prior medical history significant of paroxysmal A. fib, history of hypertension and hyperlipidemia, and hypothyroidism.   Clinical Impression   Patient is a 87 year old female who was noted to have had a functional decline. Patient was living at ALF with some assist for ADLs from caregivers at facility.  Patient was min A for ADLs on this date with min guard for functional mobility with RW with increased time. Patient was noted to have increased HR ranging from 106 to 130s bpm at end of session with reports of fatigue. Nurse made aware. Patient would continue to benefit from skilled OT services at this time while admitted and after d/c to address noted deficits in order to improve overall safety and independence in ADLs.       Recommendations for follow up therapy are one component of a multi-disciplinary discharge planning process, led by the attending physician.  Recommendations may be updated based on patient status, additional functional criteria and insurance authorization.   Follow Up Recommendations  Other (comment) (return to ALF with OT at facility)    Assistance Recommended at Discharge Frequent or constant Supervision/Assistance  Functional Status Assessment  Patient has had a recent decline in their functional status and demonstrates the ability to make significant improvements in function in a reasonable and predictable amount of time.  Equipment Recommendations  None recommended by OT    Recommendations for Other Services       Precautions / Restrictions Precautions Precautions: Fall Restrictions Weight Bearing Restrictions: No      Mobility Bed Mobility Overal bed mobility: Needs Assistance Bed Mobility: Supine to Sit     Supine to sit:  HOB elevated;Min guard     General bed mobility comments: with cues for sequencing    Transfers Overall transfer level: Needs assistance Equipment used: Rolling walker (2 wheels) Transfers: Sit to/from Stand Sit to Stand: Min guard           General transfer comment: Min assist to rise and steady with power up from toilet. Min guard for rise from recliner. pt completed multiple times for strength testing.      Balance Overall balance assessment: Needs assistance Sitting-balance support: Feet supported Sitting balance-Leahy Scale: Good     Standing balance support: Reliant on assistive device for balance;During functional activity;Bilateral upper extremity supported Standing balance-Leahy Scale: Poor                             ADL either performed or assessed with clinical judgement   ADL Overall ADL's : Needs assistance/impaired Eating/Feeding: Set up;Sitting   Grooming: Wash/dry face;Wash/dry hands;Sitting;Set up Grooming Details (indicate cue type and reason): unable to participate in standing secondary to fatigue Upper Body Bathing: Minimal assistance;Sitting   Lower Body Bathing: Minimal assistance;Sit to/from stand;Sitting/lateral leans   Upper Body Dressing : Minimal assistance;Sitting   Lower Body Dressing: Sit to/from stand;Sitting/lateral leans;Minimal assistance   Toilet Transfer: Min guard;Ambulation;Rolling walker (2 wheels) Toilet Transfer Details (indicate cue type and reason): with cues for safety and sequencing Toileting- Clothing Manipulation and Hygiene: Min guard;Sit to/from stand Toileting - Clothing Manipulation Details (indicate cue type and reason): with RW     Functional mobility during ADLs: Min guard       Vision  Perception     Praxis      Pertinent Vitals/Pain Pain Assessment: No/denies pain Pain Intervention(s): Monitored during session     Hand Dominance Right   Extremity/Trunk Assessment Upper  Extremity Assessment Upper Extremity Assessment: Overall WFL for tasks assessed   Lower Extremity Assessment Lower Extremity Assessment: Defer to PT evaluation   Cervical / Trunk Assessment Cervical / Trunk Assessment: Kyphotic   Communication Communication Communication: No difficulties   Cognition Arousal/Alertness: Awake/alert Behavior During Therapy: WFL for tasks assessed/performed Overall Cognitive Status: Within Functional Limits for tasks assessed                                       General Comments       Exercises     Shoulder Instructions      Home Living Family/patient expects to be discharged to:: Assisted living Living Arrangements: Alone Available Help at Discharge: Skilled Nursing Facility (ALF) Type of Home: Assisted living Home Access: Level entry     Home Layout: One level     Bathroom Shower/Tub: Producer, television/film/video: Handicapped height Bathroom Accessibility: Yes   Home Equipment: Hand held shower head;Grab bars - tub/shower;Grab bars - toilet;Rolling Walker (2 wheels)   Additional Comments: Pt from [facility], has a RW      Prior Functioning/Environment Prior Level of Function : Needs assist             Mobility Comments: RW for mobility and participating in PT at facility ADLs Comments: pt has assist from staff to shower, stands in shower with assist to wash. Day between this she sink bathes herself while sitting. Staff assists pt with dressing daily secondary to Lt UE ROM limitations.        OT Problem List: Decreased safety awareness;Impaired balance (sitting and/or standing);Decreased activity tolerance;Decreased knowledge of use of DME or AE      OT Treatment/Interventions: Self-care/ADL training;Therapeutic exercise;Energy conservation;DME and/or AE instruction;Balance training;Patient/family education;Therapeutic activities    OT Goals(Current goals can be found in the care plan  section) Acute Rehab OT Goals Patient Stated Goal: to get stronger OT Goal Formulation: With patient Time For Goal Achievement: 02/25/21 Potential to Achieve Goals: Good  OT Frequency: Min 2X/week   Barriers to D/C:            Co-evaluation              AM-PAC OT "6 Clicks" Daily Activity     Outcome Measure Help from another person eating meals?: None Help from another person taking care of personal grooming?: A Little Help from another person toileting, which includes using toliet, bedpan, or urinal?: A Little Help from another person bathing (including washing, rinsing, drying)?: A Little Help from another person to put on and taking off regular upper body clothing?: A Little Help from another person to put on and taking off regular lower body clothing?: A Little 6 Click Score: 19   End of Session Nurse Communication: Mobility status;Other (comment) (HR during session)  Activity Tolerance: Patient tolerated treatment well Patient left: in chair;with chair alarm set;with call bell/phone within reach;with family/visitor present  OT Visit Diagnosis: Unsteadiness on feet (R26.81);Muscle weakness (generalized) (M62.81)                Time: 8756-4332 OT Time Calculation (min): 32 min Charges:  OT General Charges $OT Visit: 1 Visit OT Evaluation $OT  Eval Low Complexity: 1 Low OT Treatments $Self Care/Home Management : 8-22 mins  Sharyn Blitz OTR/L, MS Acute Rehabilitation Department Office# [phone] Pager# [phone]   Ardyth Harps 02/11/2021, 12:27 PM

## 2021-02-11 NOTE — Evaluation (Signed)
Physical Therapy Evaluation Patient Details Name: Charlotte Powell MRN: [id number] DOB: Dec 21, 1928 Today's Date: 02/11/2021  History of Present Illness  87 y.o. F Admitted on 10/11 due to aphasia and confusion, CT negative. Prior medical history significant of paroxysmal A. fib, history of hypertension and hyperlipidemia, and hypothyroidism.    Clinical Impression  Charlotte Powell is 87 y.o. female admitted with above HPI and diagnosis. Patient is currently limited by functional impairments below (see PT problem list). Patient reside at ALF and is mod independent with RW for mobility at baseline and requires assist from staff for ADL's. Currently pt requires min guard/assist for transfers and gait. Patient will benefit from continued skilled PT interventions to address impairments and progress independence with mobility, recommending return to facility and continue with PT. Acute PT will follow and progress as able.        Recommendations for follow up therapy are one component of a multi-disciplinary discharge planning process, led by the attending physician.  Recommendations may be updated based on patient status, additional functional criteria and insurance authorization.  Follow Up Recommendations Other (comment) (return to ALF and continue with PT at facility)    Assistance Recommended at Discharge Intermittent Supervision/Assistance  Functional Status Assessment Patient has had a recent decline in their functional status and demonstrates the ability to make significant improvements in function in a reasonable and predictable amount of time.  Equipment Recommendations  None recommended by PT    Recommendations for Other Services       Precautions / Restrictions Precautions Precautions: Fall Restrictions Weight Bearing Restrictions: No      Mobility  Bed Mobility               General bed mobility comments: pt OOB on toilet and ended in recliner.    Transfers Overall  transfer level: Needs assistance Equipment used: Rolling walker (2 wheels) Transfers: Sit to/from Stand Sit to Stand: Min guard;Min assist           General transfer comment: Min assist to rise and steady with power up from toilet. Min guard for rise from recliner. pt completed multiple times for strength testing.    Ambulation/Gait Ambulation/Gait assistance: Min guard Gait Distance (Feet): 180 Feet Assistive device: Rolling walker (2 wheels) Gait Pattern/deviations: Step-through pattern;Decreased stride length;Drifts right/left Gait velocity: decr     General Gait Details: overall slow and steady gait, occasional drift/sway but no LOB noted.  Stairs            Wheelchair Mobility    Modified Rankin (Stroke Patients Only)       Balance Overall balance assessment: Needs assistance Sitting-balance support: Feet supported Sitting balance-Leahy Scale: Good     Standing balance support: Reliant on assistive device for balance;During functional activity;Bilateral upper extremity supported Standing balance-Leahy Scale: Poor                               Pertinent Vitals/Pain Pain Assessment: No/denies pain Faces Pain Scale: Hurts a little bit Pain Location: cervical spine Pain Descriptors / Indicators: Discomfort Pain Intervention(s): Monitored during session    Home Living Family/patient expects to be discharged to:: Assisted living Living Arrangements: Alone Available Help at Discharge: Skilled Nursing Facility (ALF) Type of Home: Assisted living Home Access: Level entry       Home Layout: One level Home Equipment: Hand held shower head;Grab bars - tub/shower;Grab bars - toilet;Rolling Walker (2 wheels) Additional Comments: Pt from [facility], has a Journalist, newspaper Prior Level of Function : Needs assist             Mobility Comments: RW for mobility and participating in PT at facility ADLs Comments: pt has assist from  staff to shower, stands in shower with assist to wash. Day between this she sink bathes herself while sitting. Staff assists pt with dressing daily secondary to Lt UE ROM limitations.     Hand Dominance   Dominant Hand: Right    Extremity/Trunk Assessment   Upper Extremity Assessment Upper Extremity Assessment: Defer to OT evaluation    Lower Extremity Assessment Lower Extremity Assessment: Generalized weakness (5xSit<>Stand: 19 seconds with bil UE use.)    Cervical / Trunk Assessment Cervical / Trunk Assessment: Kyphotic  Communication   Communication: No difficulties  Cognition Arousal/Alertness: Awake/alert Behavior During Therapy: WFL for tasks assessed/performed Overall Cognitive Status: Within Functional Limits for tasks assessed                                          General Comments      Exercises     Assessment/Plan    PT Assessment Patient needs continued PT services  PT Problem List Decreased strength;Decreased balance;Decreased mobility;Decreased activity tolerance;Decreased knowledge of use of DME       PT Treatment Interventions DME instruction;Gait training;Stair training;Functional mobility training;Therapeutic activities;Therapeutic exercise;Balance training;Patient/family education    PT Goals (Current goals can be found in the Care Plan section)  Acute Rehab PT Goals Patient Stated Goal: go back home today PT Goal Formulation: With patient Time For Goal Achievement: 02/25/21 Potential to Achieve Goals: Good    Frequency Min 3X/week   Barriers to discharge        Co-evaluation               AM-PAC PT "6 Clicks" Mobility  Outcome Measure Help needed turning from your back to your side while in a flat bed without using bedrails?: A Little Help needed moving from lying on your back to sitting on the side of a flat bed without using bedrails?: A Little Help needed moving to and from a bed to a chair (including a  wheelchair)?: A Little Help needed standing up from a chair using your arms (e.g., wheelchair or bedside chair)?: A Little Help needed to walk in hospital room?: A Little Help needed climbing 3-5 steps with a railing? : A Lot 6 Click Score: 17    End of Session Equipment Utilized During Treatment: Gait belt Activity Tolerance: Patient tolerated treatment well Patient left: in chair;with call bell/phone within reach;with chair alarm set;with family/visitor present Nurse Communication: Mobility status PT Visit Diagnosis: Muscle weakness (generalized) (M62.81);Difficulty in walking, not elsewhere classified (R26.2);Unsteadiness on feet (R26.81)    Time: 6203-5597 PT Time Calculation (min) (ACUTE ONLY): 21 min   Charges:   PT Evaluation $PT Eval Low Complexity: 1 Low          Wynn Maudlin, DPT Acute Rehabilitation Services Office [phone] Pager [phone]   Anitra Lauth 02/11/2021, 10:40 AM

## 2021-02-12 ENCOUNTER — Other Ambulatory Visit (HOSPITAL_COMMUNITY): Payer: Self-pay

## 2021-02-12 DIAGNOSIS — Z66 Do not resuscitate: Secondary | ICD-10-CM | POA: Diagnosis not present

## 2021-02-12 DIAGNOSIS — A0811 Acute gastroenteropathy due to Norwalk agent: Secondary | ICD-10-CM

## 2021-02-12 DIAGNOSIS — I482 Chronic atrial fibrillation, unspecified: Secondary | ICD-10-CM | POA: Diagnosis not present

## 2021-02-12 DIAGNOSIS — A084 Viral intestinal infection, unspecified: Secondary | ICD-10-CM | POA: Diagnosis not present

## 2021-02-12 DIAGNOSIS — I2510 Atherosclerotic heart disease of native coronary artery without angina pectoris: Secondary | ICD-10-CM | POA: Diagnosis not present

## 2021-02-12 LAB — CBC WITH DIFFERENTIAL/PLATELET
Abs Immature Granulocytes: 0.01 10*3/uL (ref 0.00–0.07)
Basophils Absolute: 0 10*3/uL (ref 0.0–0.1)
Basophils Relative: 0 %
Eosinophils Absolute: 0 10*3/uL (ref 0.0–0.5)
Eosinophils Relative: 1 %
HCT: 40.3 % (ref 36.0–46.0)
Hemoglobin: 13.4 g/dL (ref 12.0–15.0)
Immature Granulocytes: 0 %
Lymphocytes Relative: 40 %
Lymphs Abs: 2.4 10*3/uL (ref 0.7–4.0)
MCH: 34.5 pg — ABNORMAL HIGH (ref 26.0–34.0)
MCHC: 33.3 g/dL (ref 30.0–36.0)
MCV: 103.9 fL — ABNORMAL HIGH (ref 80.0–100.0)
Monocytes Absolute: 0.5 10*3/uL (ref 0.1–1.0)
Monocytes Relative: 8 %
Neutro Abs: 3 10*3/uL (ref 1.7–7.7)
Neutrophils Relative %: 51 %
Platelets: 143 10*3/uL — ABNORMAL LOW (ref 150–400)
RBC: 3.88 MIL/uL (ref 3.87–5.11)
RDW: 13 % (ref 11.5–15.5)
WBC: 5.9 10*3/uL (ref 4.0–10.5)
nRBC: 0 % (ref 0.0–0.2)

## 2021-02-12 LAB — URINE CULTURE: Culture: 50000 — AB

## 2021-02-12 LAB — COMPREHENSIVE METABOLIC PANEL
ALT: 13 U/L (ref 0–44)
AST: 17 U/L (ref 15–41)
Albumin: 3.4 g/dL — ABNORMAL LOW (ref 3.5–5.0)
Alkaline Phosphatase: 58 U/L (ref 38–126)
Anion gap: 6 (ref 5–15)
BUN: 14 mg/dL (ref 8–23)
CO2: 23 mmol/L (ref 22–32)
Calcium: 8.7 mg/dL — ABNORMAL LOW (ref 8.9–10.3)
Chloride: 107 mmol/L (ref 98–111)
Creatinine, Ser: 0.67 mg/dL (ref 0.44–1.00)
GFR, Estimated: >60 mL/min (ref >60)
Glucose, Bld: 118 mg/dL — ABNORMAL HIGH (ref 70–99)
Potassium: 4.3 mmol/L (ref 3.5–5.1)
Sodium: 136 mmol/L (ref 135–145)
Total Bilirubin: 0.9 mg/dL (ref 0.3–1.2)
Total Protein: 6.1 g/dL — ABNORMAL LOW (ref 6.5–8.1)

## 2021-02-12 LAB — PHOSPHORUS: Phosphorus: 2.7 mg/dL (ref 2.5–4.6)

## 2021-02-12 LAB — MAGNESIUM: Magnesium: 1.9 mg/dL (ref 1.7–2.4)

## 2021-02-12 MED ORDER — VANCOMYCIN HCL 125 MG PO CAPS: 125.0000 mg | ORAL_CAPSULE | Freq: Four times a day (QID) | ORAL | 0 refills | Status: AC

## 2021-02-12 MED ORDER — FAMOTIDINE 20 MG PO TABS: 20.0000 mg | ORAL_TABLET | Freq: Every day | ORAL | Status: DC

## 2021-02-12 MED ORDER — ONDANSETRON HCL 4 MG PO TABS
4.0000 mg | ORAL_TABLET | Freq: Four times a day (QID) | ORAL | 0 refills | Status: DC | PRN
Start: 2021-02-12 — End: 2021-11-27

## 2021-02-12 MED ORDER — BOOST / RESOURCE BREEZE PO LIQD CUSTOM
1.0000 | Freq: Three times a day (TID) | ORAL | Status: DC
  Administered 2021-02-12: 15:00:00 1 via ORAL

## 2021-02-12 MED ORDER — ENSURE ENLIVE PO LIQD: 237.0000 mL | Freq: Two times a day (BID) | ORAL | 12 refills | Status: DC

## 2021-02-12 MED ORDER — SACCHAROMYCES BOULARDII 250 MG PO CAPS: 250.0000 mg | ORAL_CAPSULE | Freq: Two times a day (BID) | ORAL | 0 refills | Status: DC

## 2021-02-12 MED ORDER — DOCUSATE SODIUM 100 MG PO CAPS
100.0000 mg | ORAL_CAPSULE | Freq: Every day | ORAL | 0 refills | Status: DC | PRN
Start: 2021-02-12 — End: 2021-11-27

## 2021-02-12 NOTE — Discharge Summary (Signed)
Physician Discharge Summary  Jaielle Dlouhy ZRA:[id number] DOB: October 25, 1928 DOA: 02/09/2021  PCP: System, Provider Not In  Admit date: 02/09/2021 Discharge date: 02/12/2021  Admitted From: ALF Disposition: ALF with continued PT/OT  Recommendations for Outpatient Follow-up:  Follow up with PCP in 1-2 weeks Follow up with Cardiology within 2-3 weeks and appointment is scheduled at [facility] with Beaulah Dinning, PA-C on 03/07/20 at 9:00 AM Please obtain CMP/CBC, Mag, Phos in one week Please follow up on the following pending results:  Home Health: No  Equipment/Devices: None   Discharge Condition: Stable  CODE STATUS: DO NOT RESUSCITATE  Diet recommendation: Heart Healthy Diet    Brief/Interim Summary: Patient is an 87 year old Caucasian female with a past medical history significant for but not limited to atrial fibrillation, CAD, hyperlipidemia, hypertension as well as other comorbidities who recently relocated from [place] to an ALF at [facility] and presented to the ED with a 1 day history of nausea, vomiting, diarrhea.  She states that she went to bed last night feeling okay but in the middle the night she had episode of diarrhea along with vomiting.  She continued to have nausea vomiting throughout the day and then daughter saw her yesterday afternoon and daughter brought to the ED is unclear if she had a fever or not and is unclear if there is a widespread illness at her ALF.  Upon evaluation her vital signs are stable she did have a low sodium.  COVID and flu were negative and chest x-ray did show some small left effusions with atelectasis versus pneumonia.  Her admission back in October she had a chest x-ray that showed similar results of left basilar opacity that represents atelectasis or pneumonia.  Due to concern of possible pneumonia [facility] hospitalist was contacted for admission and she underwent CT scan of the chest contrast which showed small bilateral  pleural effusions with partial consolidation of the lower lobe favoring atelectasis and mild cardiomegaly with trace pericardial effusion.  She also had mild pulm mild probable chronic compression deformities of the lower thoracic and upper lumbar spine.  She continued to have some diarrhea and was initiated on IV fluids and further testing revealed that she had norovirus and C. difficile.  She had an echocardiogram for her atrial fibrillation and labs seem to have improved a little bit.  Further work-up reveals that she had C. difficile positive antigen and toxigenic C. difficile via PCR so she was initiated on oral vancomycin.  Additional work-up showed that she also had norovirus.  I spoke to infectious disease Dr. Earlene Plater who recommended empirically treating the C. difficile and continue supportive care for the norovirus.  Her labs improved and her stool is improving little bit more formed today.  She has been deemed medically stable to be discharged back to her ALF with continued physical therapy and will need to follow-up with PCP and cardiology in the next few weeks.  Appointment for cardiology has already been scheduled as above.   Discharge Diagnoses:  Principal Problem:   Viral gastroenteritis Active Problems:   A-fib (HCC)   HTN (hypertension)   DNR (do not resuscitate)/DNI(Do Not Intubate)   HLD (hyperlipidemia)   CAD (coronary artery disease)  Acute nausea, vomiting and diarrhea in the setting of likely viral gastroenteritis from norovirus complicated by C. difficile -She was admitted to observation and continue monitor -GI pathogen panel was positive for norovirus and C. difficile was checked and positive for antigen and PCR was positive  for toxigenic C. difficile -Initiated gentle IV fluid hydration with normal saline at 75 mils per hour and will continue but now stop -Continue supportive care and will add probiotics and oral vancomycin for now and discussed with ID who recommends  continuing Vancomycin  -Not convinced that she has a pneumonia and CT of the chest likely represented lower lobe atelectasis with small bilateral pleural effusions -Patient is afebrile and has no leukocytosis and is not hypoxic and overall looks stable -IV fluids are being continued and we will obtain PT OT to further evaluate and treat and they do not recommend any follow-up but recommending continuing Therapy at ALF -We will continue with supportive care and antiemetics -She is much improved and back to her baseline and feels well.  Follow-up bowel movement and she has been deemed medically stable to be discharged at this time   Atrial fibrillation -She has rate controlled atrial fibrillation and is on anticoagulation with Eliquis which we have resumed -Transfer to telemetry given that she had elevated heart rates today -Echocardiogram done and showed a normal EF of 65 to 70% with indeterminate diastolic parameters due to her atrial fibrillation and borderline LVH -Resume diltiazem 60 mg p.o. twice daily -Continue to monitor on telemetry and she is stable -[facility] Referral Made and will see Tamala Bari, PA-C on 03/07/20 at 9:00 AM    CAD -Continue nitroglycerin 0.4 mg sublingually every 5 as needed for chest pain and resume her pravastatin 80 mg p.o. nightly   Hypokalemia -Patient's potassium was 3.1 and improved to 4.3 -Replete with p.o. potassium chloride 40 mg twice daily x2 doses yesterday  -Also replete with p.o. K-Phos Neutral 500 mg x 1 yesterday as well  -Mag level is low so we will replete as below -Continue to monitor and repeat CMP within 1 week    Hypomagnesemia -Mag level of 1.6 now improve to 1.9 -Replete with IV mag sulfate 2 g yesterday  -Continue monitor and replete as necessary -Repeat magnesium level within 1 week    Hypophosphatemia -Patient's phosphorus level was 2.4 and improved to 2.7 -Replete with p.o. K-Phos Neutral 500 mg x 1 yesterday -Continue  monitor replete as necessary -Repeat phosphorus level within 1 week    Hypothyroidism -Check TSH in the outpatient setting  -Resume Levothyroxine 25 mcg p.o. daily   Hyperlipidemia -Continue with statin as above   Hyponatremia -Mild.  Patient's sodium is now 136 -continue to monitor and trend and repeat CMP in a.m. agree with IV fluid hydration as above   Thrombocytopenia -Patient's platelet count went from 148 -> 124 -> 143 -Continue monitor and trend and repeat CBC in the a.m.   Hyperbilirubinemia -Likely reactive and T bili went from 1.8 is now 1.5 and further improved to 1.2 -> 0.9 -Continue monitor and trend and repeat CMP within 1 week.   Hypertension -Continue monitor blood pressures per protocol -Resume her home diltiazem -Last blood pressure reading was 122/70   Discharge Instructions   Allergies as of 02/12/2021       Reactions   Amoxicillin Other (See Comments)   unknown   Hydrocodone    Confusion and AMS        Medication List     TAKE these medications    acetaminophen 500 MG tablet Commonly known as: TYLENOL Take 1,000 mg by mouth at bedtime.   acetaminophen 325 MG tablet Commonly known as: TYLENOL Take 650 mg by mouth every 8 (eight) hours as needed for mild  pain (for pain).   Cholecalciferol 25 MCG (1000 UT) capsule Take 1,000 Units by mouth daily.   diclofenac Sodium 1 % Gel Commonly known as: VOLTAREN Apply 4 g topically in the morning and at bedtime. Apply to right side of neck   diltiazem 60 MG 12 hr capsule Commonly known as: CARDIZEM SR Take 60 mg by mouth 2 (two) times daily.   docusate sodium 100 MG capsule Commonly known as: COLACE Take 1 capsule (100 mg total) by mouth daily as needed for mild constipation. Give  by mouth once every 2 days for constipation What changed:  when to take this reasons to take this   Eliquis 2.5 MG Tabs tablet Generic drug: apixaban Take 1 tablet (2.5 mg total) by mouth 2 (two) times  daily.   famotidine 20 MG tablet Commonly known as: PEPCID Take 1 tablet (20 mg total) by mouth daily. Start taking on: February 23, 2021 What changed: These instructions start on February 23, 2021. If you are unsure what to do until then, ask your doctor or other care provider.   feeding supplement Liqd Take 237 mLs by mouth 2 (two) times daily between meals.   fluticasone 50 MCG/ACT nasal spray Commonly known as: FLONASE Place 1 spray into both nostrils daily.   folic acid 800 MCG tablet Commonly known as: FOLVITE Take 800 mcg by mouth daily.   gabapentin 100 MG capsule Commonly known as: NEURONTIN Take 100 mg by mouth at bedtime.   levothyroxine 25 MCG tablet Commonly known as: SYNTHROID Take 25 mcg by mouth daily before breakfast.   melatonin 5 MG Tabs Take 5 mg by mouth at bedtime.   nitroGLYCERIN 0.4 MG SL tablet Commonly known as: NITROSTAT Place 0.4 mg under the tongue every 5 (five) minutes as needed for chest pain.   ondansetron 4 MG tablet Commonly known as: ZOFRAN Take 1 tablet (4 mg total) by mouth every 6 (six) hours as needed for nausea.   pravastatin 80 MG tablet Commonly known as: PRAVACHOL Take 80 mg by mouth at bedtime.   saccharomyces boulardii 250 MG capsule Commonly known as: FLORASTOR Take 1 capsule (250 mg total) by mouth 2 (two) times daily.   vancomycin 125 MG capsule Commonly known as: VANCOCIN Take 1 capsule (125 mg total) by mouth 4 (four) times daily for 9 days.        Allergies  Allergen Reactions   Amoxicillin Other (See Comments)    unknown   Hydrocodone     Confusion and AMS   Consultations: Discussed with ID Dr. Earlene Plater  Procedures/Studies: CT Chest Wo Contrast  Result Date: 02/09/2021 CLINICAL DATA:  Nausea vomiting diarrhea EXAM: CT CHEST WITHOUT CONTRAST TECHNIQUE: Multidetector CT imaging of the chest was performed following the standard protocol without IV contrast. COMPARISON:  Chest x-ray 02/09/2021 FINDINGS:  Cardiovascular: Limited evaluation without intravenous contrast. Moderate aortic atherosclerosis. No aneurysm. Coronary vascular calcifications. Mild cardiomegaly. Trace pericardial effusion Mediastinum/Nodes: Midline trachea. No thyroid mass. Borderline subcarinal node measuring 10 mm. Esophagus within normal limits. Lungs/Pleura: Small bilateral pleural effusions. Partial consolidations in the lower lobes likely atelectasis. Upper Abdomen: No acute abnormality Musculoskeletal: Probable chronic superior endplate deformities at T11, T12, L1 and L2. IMPRESSION: 1. Small bilateral pleural effusions with partial consolidations at the lower lobes favored to represent atelectasis. Mild cardiomegaly with trace pericardial effusion. 2. Multiple mild probable chronic compression deformities of the lower thoracic and upper lumbar spine Aortic Atherosclerosis (ICD10-I70.0). Electronically Signed   By: Adrian Prows.D.  On: 02/09/2021 21:44   DG Chest Portable 1 View  Result Date: 02/09/2021 CLINICAL DATA:  Cough fatigue EXAM: PORTABLE CHEST 1 VIEW COMPARISON:  11/27/2020 FINDINGS: Right lung grossly clear. Mild cardiomegaly with aortic atherosclerosis. Small left effusion with airspace disease at the left base. No pneumothorax. Advanced degenerative changes of the left shoulder. IMPRESSION: 1. Small left pleural effusion with left basilar airspace disease, atelectasis versus pneumonia. 2. Mild cardiomegaly Electronically Signed   By: Jasmine Pang M.D.   On: 02/09/2021 17:34   ECHOCARDIOGRAM COMPLETE  Result Date: 02/10/2021    ECHOCARDIOGRAM REPORT   Patient Name:   ADILEE LEMME Date of Exam: 02/10/2021 Medical Rec #:  [id number]       Height:       63.0 in Accession #:    [id number]      Weight:       125.7 lb Date of Birth:  Apr 11, 1928       BSA:          1.587 m Patient Age:    87 years        BP:           125/66 mmHg Patient Gender: F               HR:           80 bpm. Exam Location:  Inpatient  Procedure: 2D Echo, Cardiac Doppler and Color Doppler Indications:    I48.91* Unspeicified atrial fibrillation  History:        Patient has no prior history of Echocardiogram examinations.                 CAD; Risk Factors:Hypertension and Dyslipidemia.  Sonographer:    Eulah Pont RDCS Referring Phys: [id number] ERIC CHEN IMPRESSIONS  1. Left ventricular ejection fraction, by estimation, is 65 to 70%. The left ventricle has normal function. The left ventricle has no regional wall motion abnormalities. Left ventricular diastolic parameters are indeterminate.  2. Right ventricular systolic function is normal. The right ventricular size is normal. There is normal pulmonary artery systolic pressure. The estimated right ventricular systolic pressure is 34.6 mmHg.  3. Left atrial size was mildly dilated.  4. The mitral valve is abnormal. Moderate mitral valve regurgitation.  5. Tricuspid valve regurgitation is mild to moderate.  6. The aortic valve is tricuspid. Aortic valve regurgitation is not visualized. Aortic valve sclerosis/calcification is present, without any evidence of aortic stenosis.  7. The inferior vena cava is normal in size with <50% respiratory variability, suggesting right atrial pressure of 8 mmHg.  8. Left pleural effusion present. Comparison(s): No prior Echocardiogram. FINDINGS  Left Ventricle: Left ventricular ejection fraction, by estimation, is 65 to 70%. The left ventricle has normal function. The left ventricle has no regional wall motion abnormalities. The left ventricular internal cavity size was normal in size. There is  borderline left ventricular hypertrophy. Left ventricular diastolic function could not be evaluated due to atrial fibrillation. Left ventricular diastolic parameters are indeterminate. Right Ventricle: The right ventricular size is normal. No increase in right ventricular wall thickness. Right ventricular systolic function is normal. There is normal pulmonary artery systolic  pressure. The tricuspid regurgitant velocity is 2.58 m/s, and  with an assumed right atrial pressure of 8 mmHg, the estimated right ventricular systolic pressure is 34.6 mmHg. Left Atrium: Left atrial size was mildly dilated. Right Atrium: Right atrial size was normal in size. Pericardium: There is no evidence of pericardial effusion. Presence of epicardial fat  layer. Mitral Valve: The mitral valve is abnormal. There is mild thickening of the mitral valve leaflet(s). There is mild calcification of the mitral valve leaflet(s). Mild mitral annular calcification. Moderate mitral valve regurgitation, with posteriorly-directed jet. Tricuspid Valve: The tricuspid valve is grossly normal. Tricuspid valve regurgitation is mild to moderate. Aortic Valve: The aortic valve is tricuspid. There is mild aortic valve annular calcification. Aortic valve regurgitation is not visualized. Aortic valve sclerosis/calcification is present, without any evidence of aortic stenosis. Pulmonic Valve: The pulmonic valve was grossly normal. Pulmonic valve regurgitation is trivial. Aorta: The aortic root is normal in size and structure. Venous: The inferior vena cava is normal in size with less than 50% respiratory variability, suggesting right atrial pressure of 8 mmHg. IAS/Shunts: No atrial level shunt detected by color flow Doppler. Additional Comments: There is pleural effusion in the left lateral region.  LEFT VENTRICLE PLAX 2D LVIDd:         3.90 cm LVIDs:         2.30 cm LV PW:         1.00 cm LV IVS:        0.90 cm LVOT diam:     1.70 cm LV SV:         35 LV SV Index:   22 LVOT Area:     2.27 cm  RIGHT VENTRICLE TAPSE (M-mode): 1.3 cm LEFT ATRIUM             Index        RIGHT ATRIUM           Index LA diam:        4.30 cm 2.71 cm/m   RA Area:     15.20 cm LA Vol (A2C):   51.0 ml 32.13 ml/m  RA Volume:   33.90 ml  21.36 ml/m LA Vol (A4C):   52.1 ml 32.83 ml/m LA Biplane Vol: 51.7 ml 32.58 ml/m  AORTIC VALVE LVOT Vmax:   80.00 cm/s  LVOT Vmean:  51.167 cm/s LVOT VTI:    0.154 m  AORTA Ao Root diam: 3.00 cm Ao Asc diam:  2.90 cm TRICUSPID VALVE TR Peak grad:   26.6 mmHg TR Vmax:        258.00 cm/s  SHUNTS Systemic VTI:  0.15 m Systemic Diam: 1.70 cm Nona Dell MD Electronically signed by Nona Dell MD Signature Date/Time: 02/10/2021/12:56:10 PM    Final      Subjective: Seen and examined at bedside had a little bit of nausea yesterday but is doing much better today.  Had a bowel movement and it was solid.  Liquidy bowel movements have improving.  Feels much better but still little weak.  No lightheadedness or dizziness.  No other concerns or complaints at this time and she has been deemed medically stable for discharge and will need to follow-up with her PCP and cardiology referral has been made.  Discharge Exam: Vitals:   02/12/21 0455 02/12/21 0924  BP: 122/70   Pulse: 69   Resp: 18 20  Temp: 97.7 F (36.5 C)   SpO2: 96%    Vitals:   02/12/21 0131 02/12/21 0455 02/12/21 0503 02/12/21 0924  BP: 118/76 122/70    Pulse: 78 69    Resp: Temp: 97.6 F (36.4 C) 97.7 F (36.5 C)    TempSrc: Oral Oral    SpO2: 96% 96%    Weight:   61.3 kg   Height:  General: Pt is alert, awake, not in acute distress Cardiovascular: RRR, S1/S2 +, no rubs, no gallops Respiratory: Diminished bilaterally at the bases, no wheezing, no rhonchi; unlabored breathing Abdominal: Soft, NT, ND, bowel sounds + Extremities: no edema, no cyanosis  The results of significant diagnostics from this hospitalization (including imaging, microbiology, ancillary and laboratory) are listed below for reference.    Microbiology: Recent Results (from the past 240 hour(s))  Resp Panel by RT-PCR (Flu A&B, Covid) Nasopharyngeal Swab     Status: None   Collection Time: 02/09/21  5:51 PM   Specimen: Nasopharyngeal Swab; Nasopharyngeal(NP) swabs in vial transport medium  Result Value Ref Range Status   SARS Coronavirus 2 by RT PCR  NEGATIVE NEGATIVE Final    Comment: (NOTE) SARS-CoV-2 target nucleic acids are NOT DETECTED.  The SARS-CoV-2 RNA is generally detectable in upper respiratory specimens during the acute phase of infection. The lowest concentration of SARS-CoV-2 viral copies this assay can detect is 138 copies/mL. A negative result does not preclude SARS-Cov-2 infection and should not be used as the sole basis for treatment or other patient management decisions. A negative result may occur with  improper specimen collection/handling, submission of specimen other than nasopharyngeal swab, presence of viral mutation(s) within the areas targeted by this assay, and inadequate number of viral copies(<138 copies/mL). A negative result must be combined with clinical observations, patient history, and epidemiological information. The expected result is Negative.  Fact Sheet for Patients:  BloggerCourse.com  Fact Sheet for Healthcare Providers:  SeriousBroker.it  This test is no t yet approved or cleared by the [place] FDA and  has been authorized for detection and/or diagnosis of SARS-CoV-2 by FDA under an Emergency Use Authorization (EUA). This EUA will remain  in effect (meaning this test can be used) for the duration of the COVID-19 declaration under Section 564(b)(1) of the Act, 21 U.S.C.section 360bbb-3(b)(1), unless the authorization is terminated  or revoked sooner.       Influenza A by PCR NEGATIVE NEGATIVE Final   Influenza B by PCR NEGATIVE NEGATIVE Final    Comment: (NOTE) The Xpert Xpress SARS-CoV-2/FLU/RSV plus assay is intended as an aid in the diagnosis of influenza from Nasopharyngeal swab specimens and should not be used as a sole basis for treatment. Nasal washings and aspirates are unacceptable for Xpert Xpress SARS-CoV-2/FLU/RSV testing.  Fact Sheet for Patients: BloggerCourse.com  Fact Sheet for  Healthcare Providers: SeriousBroker.it  This test is not yet approved or cleared by the [place] FDA and has been authorized for detection and/or diagnosis of SARS-CoV-2 by FDA under an Emergency Use Authorization (EUA). This EUA will remain in effect (meaning this test can be used) for the duration of the COVID-19 declaration under Section 564(b)(1) of the Act, 21 U.S.C. section 360bbb-3(b)(1), unless the authorization is terminated or revoked.  Performed at [facility], [address] [place], [place] [address]   Gastrointestinal Panel by PCR , Stool     Status: Abnormal   Collection Time: 02/09/21  7:32 PM   Specimen: Stool  Result Value Ref Range Status   Campylobacter species NOT DETECTED NOT DETECTED Final   Plesimonas shigelloides NOT DETECTED NOT DETECTED Final   Salmonella species NOT DETECTED NOT DETECTED Final   Yersinia enterocolitica NOT DETECTED NOT DETECTED Final   Vibrio species NOT DETECTED NOT DETECTED Final   Vibrio cholerae NOT DETECTED NOT DETECTED Final   Enteroaggregative E coli (EAEC) NOT DETECTED NOT DETECTED Final   Enteropathogenic E coli (  EPEC) NOT DETECTED NOT DETECTED Final   Enterotoxigenic E coli (ETEC) NOT DETECTED NOT DETECTED Final   Shiga like toxin producing E coli (STEC) NOT DETECTED NOT DETECTED Final   Shigella/Enteroinvasive E coli (EIEC) NOT DETECTED NOT DETECTED Final   Cryptosporidium NOT DETECTED NOT DETECTED Final   Cyclospora cayetanensis NOT DETECTED NOT DETECTED Final   Entamoeba histolytica NOT DETECTED NOT DETECTED Final   Giardia lamblia NOT DETECTED NOT DETECTED Final   Adenovirus F40/41 NOT DETECTED NOT DETECTED Final   Astrovirus NOT DETECTED NOT DETECTED Final   Norovirus GI/GII DETECTED (A) NOT DETECTED Final    Comment: RESULT CALLED TO, READ BACK BY AND VERIFIED WITH: CHINAZA ACHUSIM 02/11/21 1449 AMK    Rotavirus A NOT DETECTED NOT DETECTED Final   Sapovirus (I, II,  IV, and V) NOT DETECTED NOT DETECTED Final    Comment: Performed at [facility], [address] [place], [place] [address]  C Difficile Quick Screen w PCR reflex     Status: Abnormal   Collection Time: 02/09/21  7:32 PM   Specimen: Stool  Result Value Ref Range Status   C Diff antigen POSITIVE (A) NEGATIVE Final   C Diff toxin NEGATIVE NEGATIVE Final   C Diff interpretation Results are indeterminate. See PCR results.  Final    Comment: Performed at [facility], [address], [place], [place] [address]  C. Diff by PCR, Reflexed     Status: Abnormal   Collection Time: 02/09/21  7:32 PM  Result Value Ref Range Status   Toxigenic C. Difficile by PCR POSITIVE (A) NEGATIVE Final    Comment: Positive for toxigenic C. difficile with little to no toxin production. Only treat if clinical presentation suggests symptomatic illness. Performed at [facility], [address], [place], [place] [address]   Urine Culture     Status: None (Preliminary result)   Collection Time: 02/11/21  4:52 AM   Specimen: Urine, Clean Catch  Result Value Ref Range Status   Specimen Description   Final    URINE, CLEAN CATCH Performed at [facility], [address], [place], [place] [address]    Special Requests   Final    NONE Performed at [facility], [address], [place], [place] [address]    Culture   Final    CULTURE REINCUBATED FOR BETTER GROWTH Performed at [facility], [address] [place], [place] [address]    Report Status PENDING  Incomplete    Labs: BNP (last 3 results) Recent Labs    02/09/21 1810  BNP 356.9*   Basic Metabolic Panel: Recent Labs  Lab 02/09/21 1810 02/10/21 0956 02/11/21 0908 02/12/21 0958  NA 134* 135 135 136  K 3.8 3.8 3.1* 4.3  CL 103 106 107 107  CO2 22 23 22 23   GLUCOSE 106* 90 108* 118*  BUN 12 14 13 14   CREATININE 0.57 0.67 0.60 0.67  CALCIUM 8.9 8.6* 8.3* 8.7*  MG   --  2.0 1.6* 1.9  PHOS  --  2.7 2.4* 2.7   Liver Function Tests: Recent Labs  Lab 02/09/21 1810 02/10/21 0956 02/11/21 0908 02/12/21 0958  AST 22 18 17 17   ALT 14 14 10 13   ALKPHOS 56 48 55 58  BILITOT 1.8* 1.5* 1.2 0.9  PROT 6.7 6.1* 6.1* 6.1*  ALBUMIN 3.8 3.5 3.3* 3.4*   Recent Labs  Lab 02/09/21 1810  LIPASE 22   No results  for input(s): AMMONIA in the last 168 hours. CBC: Recent Labs  Lab 02/09/21 1810 02/10/21 0956 02/11/21 0908 02/12/21 0958  WBC 6.3 3.9* 4.5 5.9  NEUTROABS 5.4 2.7 2.7 3.0  HGB 14.5 14.1 13.4 13.4  HCT 43.3 42.4 40.0 40.3  MCV 103.3* 103.4* 102.0* 103.9*  PLT 148* 133* 124* 143*   Cardiac Enzymes: No results for input(s): CKTOTAL, CKMB, CKMBINDEX, TROPONINI in the last 168 hours. BNP: Invalid input(s): POCBNP CBG: No results for input(s): GLUCAP in the last 168 hours. D-Dimer No results for input(s): DDIMER in the last 72 hours. Hgb A1c No results for input(s): HGBA1C in the last 72 hours. Lipid Profile No results for input(s): CHOL, HDL, LDLCALC, TRIG, CHOLHDL, LDLDIRECT in the last 72 hours. Thyroid function studies No results for input(s): TSH, T4TOTAL, T3FREE, THYROIDAB in the last 72 hours.  Invalid input(s): FREET3 Anemia work up No results for input(s): VITAMINB12, FOLATE, FERRITIN, TIBC, IRON, RETICCTPCT in the last 72 hours. Urinalysis    Component Value Date/Time   COLORURINE YELLOW 02/11/2021 0452   APPEARANCEUR CLEAR 02/11/2021 0452   LABSPEC 1.010 02/11/2021 0452   PHURINE 6.0 02/11/2021 0452   GLUCOSEU NEGATIVE 02/11/2021 0452   HGBUR SMALL (A) 02/11/2021 0452   BILIRUBINUR NEGATIVE 02/11/2021 0452   KETONESUR NEGATIVE 02/11/2021 0452   PROTEINUR NEGATIVE 02/11/2021 0452   NITRITE NEGATIVE 02/11/2021 0452   LEUKOCYTESUR NEGATIVE 02/11/2021 0452   Sepsis Labs Invalid input(s): PROCALCITONIN,  WBC,  LACTICIDVEN Microbiology Recent Results (from the past 240 hour(s))  Resp Panel by RT-PCR (Flu A&B, Covid)  Nasopharyngeal Swab     Status: None   Collection Time: 02/09/21  5:51 PM   Specimen: Nasopharyngeal Swab; Nasopharyngeal(NP) swabs in vial transport medium  Result Value Ref Range Status   SARS Coronavirus 2 by RT PCR NEGATIVE NEGATIVE Final    Comment: (NOTE) SARS-CoV-2 target nucleic acids are NOT DETECTED.  The SARS-CoV-2 RNA is generally detectable in upper respiratory specimens during the acute phase of infection. The lowest concentration of SARS-CoV-2 viral copies this assay can detect is 138 copies/mL. A negative result does not preclude SARS-Cov-2 infection and should not be used as the sole basis for treatment or other patient management decisions. A negative result may occur with  improper specimen collection/handling, submission of specimen other than nasopharyngeal swab, presence of viral mutation(s) within the areas targeted by this assay, and inadequate number of viral copies(<138 copies/mL). A negative result must be combined with clinical observations, patient history, and epidemiological information. The expected result is Negative.  Fact Sheet for Patients:  BloggerCourse.com  Fact Sheet for Healthcare Providers:  SeriousBroker.it  This test is no t yet approved or cleared by the [place] FDA and  has been authorized for detection and/or diagnosis of SARS-CoV-2 by FDA under an Emergency Use Authorization (EUA). This EUA will remain  in effect (meaning this test can be used) for the duration of the COVID-19 declaration under Section 564(b)(1) of the Act, 21 U.S.C.section 360bbb-3(b)(1), unless the authorization is terminated  or revoked sooner.       Influenza A by PCR NEGATIVE NEGATIVE Final   Influenza B by PCR NEGATIVE NEGATIVE Final    Comment: (NOTE) The Xpert Xpress SARS-CoV-2/FLU/RSV plus assay is intended as an aid in the diagnosis of influenza from Nasopharyngeal swab specimens and should not be  used as a sole basis for treatment. Nasal washings and aspirates are unacceptable for Xpert Xpress SARS-CoV-2/FLU/RSV testing.  Fact Sheet for Patients: BloggerCourse.com  Fact Sheet  for Healthcare Providers: SeriousBroker.it  This test is not yet approved or cleared by the [place] and has been authorized for detection and/or diagnosis of SARS-CoV-2 by FDA under an Emergency Use Authorization (EUA). This EUA will remain in effect (meaning this test can be used) for the duration of the COVID-19 declaration under Section 564(b)(1) of the Act, 21 U.S.C. section 360bbb-3(b)(1), unless the authorization is terminated or revoked.  Performed at [facility], [address] [place], [place] [address]   Gastrointestinal Panel by PCR , Stool     Status: Abnormal   Collection Time: 02/09/21  7:32 PM   Specimen: Stool  Result Value Ref Range Status   Campylobacter species NOT DETECTED NOT DETECTED Final   Plesimonas shigelloides NOT DETECTED NOT DETECTED Final   Salmonella species NOT DETECTED NOT DETECTED Final   Yersinia enterocolitica NOT DETECTED NOT DETECTED Final   Vibrio species NOT DETECTED NOT DETECTED Final   Vibrio cholerae NOT DETECTED NOT DETECTED Final   Enteroaggregative E coli (EAEC) NOT DETECTED NOT DETECTED Final   Enteropathogenic E coli (EPEC) NOT DETECTED NOT DETECTED Final   Enterotoxigenic E coli (ETEC) NOT DETECTED NOT DETECTED Final   Shiga like toxin producing E coli (STEC) NOT DETECTED NOT DETECTED Final   Shigella/Enteroinvasive E coli (EIEC) NOT DETECTED NOT DETECTED Final   Cryptosporidium NOT DETECTED NOT DETECTED Final   Cyclospora cayetanensis NOT DETECTED NOT DETECTED Final   Entamoeba histolytica NOT DETECTED NOT DETECTED Final   Giardia lamblia NOT DETECTED NOT DETECTED Final   Adenovirus F40/41 NOT DETECTED NOT DETECTED Final   Astrovirus NOT DETECTED NOT DETECTED Final    Norovirus GI/GII DETECTED (A) NOT DETECTED Final    Comment: RESULT CALLED TO, READ BACK BY AND VERIFIED WITH: CHINAZA ACHUSIM 02/11/21 1449 AMK    Rotavirus A NOT DETECTED NOT DETECTED Final   Sapovirus (I, II, IV, and V) NOT DETECTED NOT DETECTED Final    Comment: Performed at [facility], [address] [place], [place] [address]  C Difficile Quick Screen w PCR reflex     Status: Abnormal   Collection Time: 02/09/21  7:32 PM   Specimen: Stool  Result Value Ref Range Status   C Diff antigen POSITIVE (A) NEGATIVE Final   C Diff toxin NEGATIVE NEGATIVE Final   C Diff interpretation Results are indeterminate. See PCR results.  Final    Comment: Performed at [facility], [address], [place], [place] [address]  C. Diff by PCR, Reflexed     Status: Abnormal   Collection Time: 02/09/21  7:32 PM  Result Value Ref Range Status   Toxigenic C. Difficile by PCR POSITIVE (A) NEGATIVE Final    Comment: Positive for toxigenic C. difficile with little to no toxin production. Only treat if clinical presentation suggests symptomatic illness. Performed at [facility], [address] [place], [place] [address]   Urine Culture     Status: None (Preliminary result)   Collection Time: 02/11/21  4:52 AM   Specimen: Urine, Clean Catch  Result Value Ref Range Status   Specimen Description   Final    URINE, CLEAN CATCH Performed at [facility], [address], [place], [place] [address]    Special Requests   Final    NONE Performed at [facility], [address], [place], [place] [address]    Culture   Final    CULTURE REINCUBATED FOR BETTER GROWTH Performed  at [facility], [address] [place], [place] [address]    Report Status PENDING  Incomplete   Time coordinating discharge: 35 minutes  SIGNED:  Merlene Laughter, DO [facility] 02/12/2021, 12:21 PM Pager is on AMION  If 7PM-7AM, please  contact night-coverage [email or web address]

## 2021-03-07 ENCOUNTER — Ambulatory Visit (HOSPITAL_COMMUNITY): Payer: Medicare HMO | Admitting: Physician Assistant

## 2021-03-21 ENCOUNTER — Ambulatory Visit (HOSPITAL_COMMUNITY)
Admission: RE | Admit: 2021-03-21 | Discharge: 2021-03-21 | Disposition: A | Discharge status: Home / Self Care | Payer: Medicare HMO | Source: Ambulatory Visit | Attending: Physician Assistant | Admitting: Physician Assistant

## 2021-03-21 ENCOUNTER — Encounter (HOSPITAL_COMMUNITY): Payer: Self-pay | Admitting: Physician Assistant

## 2021-03-21 ENCOUNTER — Other Ambulatory Visit: Payer: Self-pay

## 2021-03-21 VITALS — BP 142/72 | HR 63 | Ht 63.0 in | Wt 130.6 lb

## 2021-03-21 DIAGNOSIS — Z7901 Long term (current) use of anticoagulants: Secondary | ICD-10-CM | POA: Diagnosis not present

## 2021-03-21 DIAGNOSIS — I2510 Atherosclerotic heart disease of native coronary artery without angina pectoris: Secondary | ICD-10-CM | POA: Diagnosis not present

## 2021-03-21 DIAGNOSIS — I484 Atypical atrial flutter: Secondary | ICD-10-CM | POA: Diagnosis not present

## 2021-03-21 DIAGNOSIS — Z79899 Other long term (current) drug therapy: Secondary | ICD-10-CM | POA: Diagnosis not present

## 2021-03-21 DIAGNOSIS — I4819 Other persistent atrial fibrillation: Secondary | ICD-10-CM | POA: Insufficient documentation

## 2021-03-21 DIAGNOSIS — E785 Hyperlipidemia, unspecified: Secondary | ICD-10-CM | POA: Diagnosis not present

## 2021-03-21 DIAGNOSIS — I10 Essential (primary) hypertension: Secondary | ICD-10-CM | POA: Diagnosis not present

## 2021-03-21 DIAGNOSIS — D6869 Other thrombophilia: Secondary | ICD-10-CM | POA: Diagnosis not present

## 2021-03-21 DIAGNOSIS — I4439 Other atrioventricular block: Secondary | ICD-10-CM | POA: Insufficient documentation

## 2021-03-21 NOTE — Progress Notes (Signed)
Primary Care Physician: System, Provider Not In Primary Cardiologist: none Primary Electrophysiologist: none Referring Physician: Dr Forestine Na is a 88 y.o. female with a history of CAD, HLD, HTN, atrial fibrillation who presents for consultation in the [facility].  The patient was initially diagnosed with atrial fibrillation remotely and was followed by cardiology for years before moving to the area. Patient is on Eliquis for a CHADS2VASC score of 5. She was admitted 02/09/21 with nausea, vomiting, and diarrhea. She was positive for norovirus and C diff. She was also noted to have an intermittent elevated heart rate in afib. She is in rate controlled afib today and feeling well. She denies any awareness of her arrhythmia. She denies any bleeding issues on anticoagulation.   Today, she denies symptoms of palpitations, chest pain, shortness of breath, orthopnea, PND, lower extremity edema, dizziness, presyncope, syncope, snoring, daytime somnolence, bleeding, or neurologic sequela. The patient is tolerating medications without difficulties and is otherwise without complaint today.    Atrial Fibrillation Risk Factors:  she does not have symptoms or diagnosis of sleep apnea. she does not have a history of rheumatic fever.   she has a BMI of Body mass index is 23.13 kg/m.Marland Kitchen Filed Weights   03/21/21 1349  Weight: 59.2 kg    Family History  Problem Relation Age of Onset   Stomach cancer Neg Hx      Atrial Fibrillation Management history:  Previous antiarrhythmic drugs: none Previous cardioversions: none Previous ablations: none CHADS2VASC score: 5 Anticoagulation history: Eliquis   Past Medical History:  Diagnosis Date   CAD (coronary artery disease)    Diverticulitis    GERD (gastroesophageal reflux disease)    HLD (hyperlipidemia)    HTN (hypertension)    Hypothyroidism    Past Surgical History:  Procedure Laterality Date    ANKLE FRACTURE SURGERY     CHOLECYSTECTOMY     CORONARY STENT PLACEMENT     FEMUR FRACTURE SURGERY     WRIST FRACTURE SURGERY      Current Outpatient Medications  Medication Sig Dispense Refill   acetaminophen (TYLENOL) 325 MG tablet Take 650 mg by mouth every 8 (eight) hours as needed for mild pain (for pain).     acetaminophen (TYLENOL) 500 MG tablet Take 1,000 mg by mouth at bedtime.     apixaban (ELIQUIS) 2.5 MG TABS tablet Take 1 tablet (2.5 mg total) by mouth 2 (two) times daily. 60 tablet 2   Cholecalciferol 25 MCG (1000 UT) capsule Take 1,000 Units by mouth daily.     diclofenac Sodium (VOLTAREN) 1 % GEL Apply 4 g topically in the morning and at bedtime. Apply to right side of neck     diltiazem (CARDIZEM SR) 60 MG 12 hr capsule Take 60 mg by mouth 2 (two) times daily.     docusate sodium (COLACE) 100 MG capsule Take 1 capsule (100 mg total) by mouth daily as needed for mild constipation. Give 100mg  by mouth once every 2 days for constipation 10 capsule 0   famotidine (PEPCID) 20 MG tablet Take 1 tablet (20 mg total) by mouth daily.     feeding supplement (ENSURE ENLIVE / ENSURE PLUS) LIQD Take 237 mLs by mouth 2 (two) times daily between meals. 237 mL 12   fluticasone (FLONASE) 50 MCG/ACT nasal spray Place 1 spray into both nostrils daily.     folic acid (FOLVITE) Q000111Q MCG tablet Take 800 mcg by mouth daily.  gabapentin (NEURONTIN) 100 MG capsule Take 100 mg by mouth at bedtime.     levothyroxine (SYNTHROID) 25 MCG tablet Take 25 mcg by mouth daily before breakfast.     melatonin 5 MG TABS Take 5 mg by mouth at bedtime.     nitroGLYCERIN (NITROSTAT) 0.4 MG SL tablet Place 0.4 mg under the tongue every 5 (five) minutes as needed for chest pain.     ondansetron (ZOFRAN) 4 MG tablet Take 1 tablet (4 mg total) by mouth every 6 (six) hours as needed for nausea. 20 tablet 0   pravastatin (PRAVACHOL) 80 MG tablet Take 80 mg by mouth at bedtime.     saccharomyces boulardii (FLORASTOR)  250 MG capsule Take 1 capsule (250 mg total) by mouth 2 (two) times daily. 60 capsule 0   No current facility-administered medications for this encounter.    Allergies  Allergen Reactions   Amoxicillin Other (See Comments)    unknown   Hydrocodone     Confusion and AMS    Social History   Socioeconomic History   Marital status: Widowed    Spouse name: Not on file   Number of children: Not on file   Years of education: Not on file   Highest education level: Not on file  Occupational History   Not on file  Tobacco Use   Smoking status: Never   Smokeless tobacco: Never  Substance and Sexual Activity   Alcohol use: Never   Drug use: Never   Sexual activity: Not on file  Other Topics Concern   Not on file  Social History Narrative   Not on file   Social Determinants of Health   Financial Resource Strain: Not on file  Food Insecurity: Not on file  Transportation Needs: Not on file  Physical Activity: Not on file  Stress: Not on file  Social Connections: Not on file  Intimate Partner Violence: Not on file     ROS- All systems are reviewed and negative except as per the HPI above.  Physical Exam: Vitals:   03/21/21 1349  BP: (!) 142/72  Pulse: 63  Weight: 59.2 kg  Height: 5\' 3"  (1.6 m)    GEN- The patient is a well appearing elderly female, alert and oriented x 3 today.   Head- normocephalic, atraumatic Eyes-  Sclera clear, conjunctiva pink Ears- hearing intact Oropharynx- clear Neck- supple  Lungs- Clear to ausculation bilaterally, normal work of breathing Heart- irregular rate and rhythm, no murmurs, rubs or gallops  GI- soft, NT, ND, + BS Extremities- no clubbing, cyanosis, or edema MS- no significant deformity or atrophy Skin- no rash or lesion Psych- euthymic mood, full affect Neuro- strength and sensation are intact  Wt Readings from Last 3 Encounters:  03/21/21 59.2 kg  02/12/21 61.3 kg  11/30/20 57 kg    EKG today demonstrates   Afib Vent. rate 63 BPM PR interval * ms QRS duration 82 ms QT/QTcB 394/403 ms  Echo 02/10/21 demonstrated   1. Left ventricular ejection fraction, by estimation, is 65 to 70%. The  left ventricle has normal function. The left ventricle has no regional  wall motion abnormalities. Left ventricular diastolic parameters are  indeterminate.   2. Right ventricular systolic function is normal. The right ventricular  size is normal. There is normal pulmonary artery systolic pressure. The  estimated right ventricular systolic pressure is Q000111Q mmHg.   3. Left atrial size was mildly dilated.   4. The mitral valve is abnormal. Moderate mitral valve  regurgitation.   5. Tricuspid valve regurgitation is mild to moderate.   6. The aortic valve is tricuspid. Aortic valve regurgitation is not  visualized. Aortic valve sclerosis/calcification is present, without any  evidence of aortic stenosis.   7. The inferior vena cava is normal in size with <50% respiratory  variability, suggesting right atrial pressure of 8 mmHg.   8. Left pleural effusion present.   Epic records are reviewed at length today  CHA2DS2-VASc Score = 5  The patient's score is based upon: CHF History: 0 HTN History: 1 Diabetes History: 0 Stroke History: 0 Vascular Disease History: 1 Age Score: 2 Gender Score: 1       ASSESSMENT AND PLAN: 1. Persistent Atrial Fibrillation/atypical atrial flutter The patient's CHA2DS2-VASc score is 5, indicating a 7.2% annual risk of stroke. We discussed rate vs rhythm control today. Given her age, preserved EF, and paucity of symptoms, patient and her family would like to pursue a rate control strategy for now.  Continue Eliquis 2.5 mg BID Continue diltiazem 60 mg BID  2. Secondary Hypercoagulable State (ICD10:  D68.69) The patient is at significant risk for stroke/thromboembolism based upon her CHA2DS2-VASc Score of 5.  Continue Apixaban (Eliquis).   3. HTN Stable, no changes  today.  4. CAD No anginal symptoms.   Patient would like to establish care with a cardiologist in the area, will refer. AF clinic as needed.    [facility] [address] [place], [place] [address] [phone] 03/21/2021 2:11 PM

## 2021-03-27 NOTE — Progress Notes (Signed)
GI viral panel ordered due to pt having diarrhea  BNP ordered due to history of afib

## 2021-04-10 ENCOUNTER — Ambulatory Visit: Payer: Medicare HMO | Admitting: Cardiology

## 2021-04-14 ENCOUNTER — Other Ambulatory Visit: Payer: Self-pay

## 2021-04-14 ENCOUNTER — Encounter (HOSPITAL_COMMUNITY): Payer: Self-pay | Admitting: Internal Medicine

## 2021-04-14 ENCOUNTER — Emergency Department (HOSPITAL_COMMUNITY)
Admission: EM | Admit: 2021-04-14 | Discharge: 2021-04-14 | Disposition: A | Discharge status: Skilled Nursing Facility | Payer: Medicare HMO | Attending: Emergency Medicine | Admitting: Emergency Medicine

## 2021-04-14 ENCOUNTER — Emergency Department (HOSPITAL_COMMUNITY): Payer: Medicare HMO

## 2021-04-14 DIAGNOSIS — Z79899 Other long term (current) drug therapy: Secondary | ICD-10-CM | POA: Insufficient documentation

## 2021-04-14 DIAGNOSIS — I2510 Atherosclerotic heart disease of native coronary artery without angina pectoris: Secondary | ICD-10-CM | POA: Diagnosis present

## 2021-04-14 DIAGNOSIS — Z7901 Long term (current) use of anticoagulants: Secondary | ICD-10-CM | POA: Insufficient documentation

## 2021-04-14 DIAGNOSIS — M79622 Pain in left upper arm: Secondary | ICD-10-CM | POA: Diagnosis present

## 2021-04-14 DIAGNOSIS — R1030 Lower abdominal pain, unspecified: Secondary | ICD-10-CM | POA: Diagnosis not present

## 2021-04-14 DIAGNOSIS — M25512 Pain in left shoulder: Secondary | ICD-10-CM | POA: Diagnosis not present

## 2021-04-14 DIAGNOSIS — M79602 Pain in left arm: Secondary | ICD-10-CM | POA: Diagnosis not present

## 2021-04-14 DIAGNOSIS — I10 Essential (primary) hypertension: Secondary | ICD-10-CM | POA: Diagnosis present

## 2021-04-14 DIAGNOSIS — R0789 Other chest pain: Secondary | ICD-10-CM | POA: Diagnosis present

## 2021-04-14 DIAGNOSIS — Z66 Do not resuscitate: Secondary | ICD-10-CM | POA: Diagnosis present

## 2021-04-14 DIAGNOSIS — E785 Hyperlipidemia, unspecified: Secondary | ICD-10-CM | POA: Diagnosis present

## 2021-04-14 DIAGNOSIS — I4891 Unspecified atrial fibrillation: Secondary | ICD-10-CM | POA: Diagnosis present

## 2021-04-14 LAB — BASIC METABOLIC PANEL
Anion gap: 10 (ref 5–15)
BUN: 14 mg/dL (ref 8–23)
CO2: 24 mmol/L (ref 22–32)
Calcium: 10 mg/dL (ref 8.9–10.3)
Chloride: 107 mmol/L (ref 98–111)
Creatinine, Ser: 0.78 mg/dL (ref 0.44–1.00)
GFR, Estimated: >60 mL/min (ref >60)
Glucose, Bld: 93 mg/dL (ref 70–99)
Potassium: 4.1 mmol/L (ref 3.5–5.1)
Sodium: 141 mmol/L (ref 135–145)

## 2021-04-14 LAB — CBC
HCT: 43.9 % (ref 36.0–46.0)
Hemoglobin: 14.6 g/dL (ref 12.0–15.0)
MCH: 34.6 pg — ABNORMAL HIGH (ref 26.0–34.0)
MCHC: 33.3 g/dL (ref 30.0–36.0)
MCV: 104 fL — ABNORMAL HIGH (ref 80.0–100.0)
Platelets: 157 10*3/uL (ref 150–400)
RBC: 4.22 MIL/uL (ref 3.87–5.11)
RDW: 13.4 % (ref 11.5–15.5)
WBC: 6.4 10*3/uL (ref 4.0–10.5)
nRBC: 0 % (ref 0.0–0.2)

## 2021-04-14 LAB — TROPONIN I (HIGH SENSITIVITY)
Troponin I (High Sensitivity): 7 ng/L (ref <18)
Troponin I (High Sensitivity): 7 ng/L (ref <18)

## 2021-04-14 MED ORDER — FENTANYL CITRATE PF 50 MCG/ML IJ SOSY
50.0000 ug | PREFILLED_SYRINGE | Freq: Once | INTRAMUSCULAR | Status: AC
  Administered 2021-04-14: 50 ug via INTRAVENOUS
  Filled 2021-04-14: qty 1

## 2021-04-14 MED ORDER — DILTIAZEM HCL 25 MG/5ML IV SOLN
10.0000 mg | Freq: Once | INTRAVENOUS | Status: AC
  Administered 2021-04-14: 10 mg via INTRAVENOUS
  Filled 2021-04-14: qty 5

## 2021-04-14 MED ORDER — MORPHINE SULFATE (PF) 2 MG/ML IV SOLN
2.0000 mg | Freq: Once | INTRAVENOUS | Status: AC
  Administered 2021-04-14: 2 mg via INTRAVENOUS
  Filled 2021-04-14: qty 1

## 2021-04-14 MED ORDER — APIXABAN 2.5 MG PO TABS
2.5000 mg | ORAL_TABLET | Freq: Two times a day (BID) | ORAL | Status: DC
  Administered 2021-04-14: 2.5 mg via ORAL
  Filled 2021-04-14: qty 1

## 2021-04-14 MED ORDER — APIXABAN 2.5 MG PO TABS: 2.5000 mg | ORAL_TABLET | Freq: Two times a day (BID) | ORAL | Status: DC

## 2021-04-14 MED ORDER — DILTIAZEM HCL ER 60 MG PO CP12
60.0000 mg | ORAL_CAPSULE | Freq: Two times a day (BID) | ORAL | Status: DC
  Administered 2021-04-14: 60 mg via ORAL
  Filled 2021-04-14 (×2): qty 1

## 2021-04-14 MED ORDER — LIDOCAINE 5 % EX PTCH: 1.0000 | MEDICATED_PATCH | CUTANEOUS | 0 refills | Status: DC

## 2021-04-14 MED ORDER — DILTIAZEM HCL-DEXTROSE 125-5 MG/125ML-% IV SOLN (PREMIX)
5.0000 mg/h | INTRAVENOUS | Status: DC
  Administered 2021-04-14: 5 mg/h via INTRAVENOUS
  Filled 2021-04-14: qty 125

## 2021-04-14 MED ORDER — MORPHINE SULFATE (PF) 2 MG/ML IV SOLN: 2.0000 mg | Freq: Once | INTRAVENOUS | Status: DC

## 2021-04-14 MED ORDER — DICLOFENAC SODIUM 1 % EX GEL
4.0000 g | Freq: Two times a day (BID) | CUTANEOUS | Status: DC
  Administered 2021-04-14: 4 g via TOPICAL
  Filled 2021-04-14: qty 100

## 2021-04-14 MED ORDER — LIDOCAINE 5 % EX PTCH
1.0000 | MEDICATED_PATCH | CUTANEOUS | Status: DC
  Administered 2021-04-14: 1 via TRANSDERMAL
  Filled 2021-04-14: qty 1

## 2021-04-14 MED ORDER — ACETAMINOPHEN 325 MG PO TABS: 650.0000 mg | ORAL_TABLET | Freq: Three times a day (TID) | ORAL | Status: DC | PRN

## 2021-04-14 MED ORDER — FAMOTIDINE 20 MG PO TABS
20.0000 mg | ORAL_TABLET | Freq: Every day | ORAL | Status: DC
  Administered 2021-04-14: 20 mg via ORAL
  Filled 2021-04-14: qty 1

## 2021-04-14 NOTE — Assessment & Plan Note (Signed)
-  Patient is DNR and has form

## 2021-04-14 NOTE — ED Provider Notes (Signed)
[facility] EMERGENCY DEPARTMENT Provider Note   CSN: [id number] Arrival date & time: 04/14/21  0204     History  Chief Complaint  Patient presents with   Arm Pain   Chest Pain    Rollins Strzalka is a 88 y.o. female.  The history is provided by the patient and medical records.  Arm Pain Associated symptoms include chest pain.  Chest Pain Sheniqua Kohnen is a 88 y.o. female who presents to the Emergency Department complaining of chest pain/arm pain.  She presents to the ED for evaluation of left shoulder/arm pain that spreads to her chest.  Pain comes and goes for several days.  Pain is worse with movement of the arm.    No sob, fever, diaphoresis.  Has mild lower abdominal discomfort - unclear how long it has been going on.    Has lower extremity edema - ongoing issue, unchanged.    Has remote hx/o trauma but no new injuries.    Lives at [facility].  No recent med changes or missed meds.    In persistent afib.      Home Medications Prior to Admission medications   Medication Sig Start Date End Date Taking? Authorizing Provider  acetaminophen (TYLENOL) 325 MG tablet Take 650 mg by mouth every 8 (eight) hours as needed for mild pain (for pain).    [provider]  acetaminophen (TYLENOL) 500 MG tablet Take 1,000 mg by mouth at bedtime.    [provider]  apixaban (ELIQUIS) 2.5 MG TABS tablet Take 1 tablet (2.5 mg total) by mouth 2 (two) times daily. 11/30/20   Ghimire, Henreitta Leber, MD  Cholecalciferol 25 MCG (1000 UT) capsule Take 1,000 Units by mouth daily.    [provider]  diclofenac Sodium (VOLTAREN) 1 % GEL Apply 4 g topically in the morning and at bedtime. Apply to right side of neck 10/23/20   [provider]  diltiazem (CARDIZEM SR) 60 MG 12 hr capsule Take 60 mg by mouth 2 (two) times daily.    [provider]  docusate sodium (COLACE) 100 MG capsule Take 1 capsule (100 mg total) by mouth daily  as needed for mild constipation. Give 100mg  by mouth once every 2 days for constipation 02/12/21   Raiford Noble Latif, DO  famotidine (PEPCID) 20 MG tablet Take 1 tablet (20 mg total) by mouth daily. 02/23/21   Raiford Noble Latif, DO  feeding supplement (ENSURE ENLIVE / ENSURE PLUS) LIQD Take 237 mLs by mouth 2 (two) times daily between meals. 02/12/21   Sheikh, Omair Latif, DO  fluticasone (FLONASE) 50 MCG/ACT nasal spray Place 1 spray into both nostrils daily. 08/07/20   [provider]  folic acid (FOLVITE) Q000111Q MCG tablet Take 800 mcg by mouth daily.    [provider]  gabapentin (NEURONTIN) 100 MG capsule Take 100 mg by mouth at bedtime.    [provider]  levothyroxine (SYNTHROID) 25 MCG tablet Take 25 mcg by mouth daily before breakfast.    [provider]  melatonin 5 MG TABS Take 5 mg by mouth at bedtime.    [provider]  nitroGLYCERIN (NITROSTAT) 0.4 MG SL tablet Place 0.4 mg under the tongue every 5 (five) minutes as needed for chest pain. 08/07/20   [provider]  ondansetron (ZOFRAN) 4 MG tablet Take 1 tablet (4 mg total) by mouth every 6 (six) hours as needed for nausea. 02/12/21   Raiford Noble Latif, DO  pravastatin (PRAVACHOL)  80 MG tablet Take 80 mg by mouth at bedtime. 10/25/20   [provider]  saccharomyces boulardii (FLORASTOR) 250 MG capsule Take 1 capsule (250 mg total) by mouth 2 (two) times daily. 02/12/21   Raiford Noble Latif, DO      Allergies    Amoxicillin and Hydrocodone    Review of Systems   Review of Systems  Cardiovascular:  Positive for chest pain.  All other systems reviewed and are negative.  Physical Exam Updated Vital Signs BP 128/83    Pulse (!) 104    Temp (!) 97.5 F (36.4 C) (Oral)    Resp (!) 35    Ht 5\' 3"  (1.6 m)    Wt 59 kg    SpO2 93%    BMI 23.03 kg/m  Physical Exam Vitals and nursing note reviewed.  Constitutional:      Appearance: She is well-developed.  HENT:      Head: Normocephalic and atraumatic.  Cardiovascular:     Rate and Rhythm: Tachycardia present. Rhythm irregular.     Heart sounds: No murmur heard. Pulmonary:     Effort: Pulmonary effort is normal. No respiratory distress.     Breath sounds: Normal breath sounds.  Abdominal:     Palpations: Abdomen is soft.     Tenderness: There is no abdominal tenderness. There is no guarding or rebound.  Musculoskeletal:        General: No tenderness.     Comments: 1+ pitting edema to BLE.  2+ radial pulses bilaterally.  No TTP over left shoulder, humerus or elbow.  No significant LUE swelling.  ROM intact at shoulder, elbow.   Skin:    General: Skin is warm and dry.  Neurological:     Mental Status: She is alert and oriented to person, place, and time.  Psychiatric:        Behavior: Behavior normal.    ED Results / Procedures / Treatments   Labs (all labs ordered are listed, but only abnormal results are displayed) Labs Reviewed  CBC - Abnormal; Notable for the following components:      Result Value   MCV 104.0 (*)    MCH 34.6 (*)    All other components within normal limits  BASIC METABOLIC PANEL  TROPONIN I (HIGH SENSITIVITY)  TROPONIN I (HIGH SENSITIVITY)    EKG EKG Interpretation  Date/Time:  Sunday April 14 2021 02:12:47 EST Ventricular Rate:  142 PR Interval:    QRS Duration: 74 QT Interval:  288 QTC Calculation: 443 R Axis:   66 Text Interpretation: Atrial fibrillation with rapid ventricular response Low voltage QRS Cannot rule out Anterior infarct , age undetermined Abnormal ECG When compared with ECG of 21-Mar-2021 14:00, PREVIOUS ECG IS PRESENT Confirmed by Quintella Reichert [phone]) on 04/14/2021 2:21:26 AM  Radiology DG Chest 2 View  Result Date: 04/14/2021 CLINICAL DATA:  Chest pain EXAM: CHEST - 2 VIEW COMPARISON:  02/09/2021 FINDINGS: Small left pleural effusion is unchanged. Minimal left basilar atelectasis or scarring. Lungs are otherwise clear. No pneumothorax.  No pleural effusion on the right. Mild cardiomegaly is stable. Pulmonary vascularity is normal. Osseous structures are age-appropriate. IMPRESSION: Stable small left pleural effusion. Stable cardiomegaly. Electronically Signed   By: Fidela Salisbury M.D.   On: 04/14/2021 03:50   DG Shoulder Left  Result Date: 04/14/2021 CLINICAL DATA:  Left shoulder pain. EXAM: LEFT SHOULDER - 2+ VIEW COMPARISON:  Chest CT and portable chest both 02/09/2021. FINDINGS: There is chronic healed fracture deformity again  noted of the proximal left humerus with bone-on-bone joint space loss inferiorly in the glenohumeral joint with inferior osteophytes. There is additionally partial loss of the acromial humeral space suggesting chronic rotator cuff arthropathy. Slight spurring AC joint is again noted and there is osteopenia with no evidence of fractures. 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 Mild degenerative change AC joint. Osteopenia. Likely chronic rotator cuff arthropathy as well. Electronically Signed   By: Telford Nab M.D.   On: 04/14/2021 03:52   DG Humerus Left  Result Date: 04/14/2021 CLINICAL DATA:  Left arm and shoulder pain. EXAM: LEFT HUMERUS - 2+ VIEW COMPARISON:  Chest CT 02/09/2021.  Portable chest 02/09/2021. FINDINGS: Osteopenia. No fracture is evident. There is a chronic healed fracture deformity of the proximal left humerus with joint space loss with bone-on-bone apposition of the inferior glenohumeral joint with inferior osteophytes. There is slight spurring of the [facility] joint without dislocation. There is a partial loss of the acromiohumeral space which may reflect changes of chronic rotator cuff arthropathy. 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 Likely chronic rotator cuff arthropathy as well. Osteopenia. Electronically Signed   By: Telford Nab M.D.    On: 04/14/2021 03:57    Procedures Procedures    Medications Ordered in ED Medications  diltiazem (CARDIZEM) 125 mg in dextrose 5% 125 mL (1 mg/mL) infusion (12.5 mg/hr Intravenous Rate/Dose Change 04/14/21 0649)  diltiazem (CARDIZEM) injection 10 mg (10 mg Intravenous Given 04/14/21 0308)  morphine (PF) 2 MG/ML injection 2 mg (2 mg Intravenous Given 04/14/21 0310)  fentaNYL (SUBLIMAZE) injection 50 mcg (50 mcg Intravenous Given 04/14/21 0524)    ED Course/ Medical Decision Making/ A&P                           Medical Decision Making Amount and/or Complexity of Data Reviewed Labs: ordered. Radiology: ordered.  Risk Prescription drug management. Decision regarding hospitalization.   Patient with history of persistent A-fib here for evaluation of left-sided chest pain, shoulder pain.  No evidence of septic or gouty arthritis.  Plain films with degenerative changes, no acute findings.  She was treated with morphine with no improvement in her pain followed by fentanyl.  She was found to be in A-fib with RVR.  She was treated with a Cardizem bolus with no significant change in her rate, will start Cardizem drip.  Given her ongoing pain, persistent A-fib with RVR plan to admit for ongoing treatment.        Final Clinical Impression(s) / ED Diagnoses Final diagnoses:  None    Rx / DC Orders ED Discharge Orders     None         Quintella Reichert, MD 04/14/21 [phone]

## 2021-04-14 NOTE — Assessment & Plan Note (Signed)
-  s/p stent -Negative troponin -Suspect that chest tightness was related to afib with RVR rather than ACS -No further evaluation is needed currently -She is scheduled to see cardiology on Wednesday

## 2021-04-14 NOTE — Discharge Instructions (Addendum)
Charlotte Powell was seen in the ER for chest pain. She was noted to have A-fib with fast heart rate.  Over time, her heart rate has improved.  Her cardiac work-up is normal.  All the blood work is reassuring. Continue with the Eliquis and diltiazem, which are medications for her A-fib. Have her follow-up with her cardiologist or primary care doctor for follow-up in 1 week.  Return to the ER if the symptoms return.

## 2021-04-14 NOTE — ED Notes (Signed)
Pt transported to [facility] via family member.

## 2021-04-14 NOTE — ED Notes (Signed)
Pt ambulatory using walker, with standby assist. Pt ambulatory without difficulty, endorses slight generalized weakness but denies complaints during ambulation. Ambulatory with steady gait.

## 2021-04-14 NOTE — Consult Note (Signed)
ER Consult   Patient: Charlotte Powell [id number] DOB: 02-25-1928 DOA: 04/14/2021 DOS: the patient was seen and examined on 04/14/2021 PCP: System, Provider Not In  Patient coming from: ALF/ILF - [facility]; [facility]: Daughter, Marcille Buffy, [phone]   Chief Complaint: CP  HPI: Charlotte Powell is a 88 y.o. female with medical history significant of CAD with stent; HTN; HLD; hypothyroidism; and afib on Eliquis presenting with CP. She reports L arm pain and into her shoulder.  Pain started overnight - she has pain in it a lot but it got worse last night.  No SOB.  She thought she had a little chest pressure but that has resolved, apparently spontaneously.  She has chronic afib and does not notice it.   She is uncertain about the rate.  They were seen at the afib clinic recently.  She has chronic L arm and shoulder pain and it is still worse than her usual.  She uses arthritis cream a lot on her neck and shoulder, has not seen ortho about this.    ER Course:  Carryover, per Dr. Sidney Ace:  88 year old female with H/O CAD, HTN, HL, permanent A-fib, coming with left-sided chest pain with radiation to his shoulder and atrial fibrillation with RVR.  He received IV morphine sulfate and fentanyl, IV Cardizem bolus and drip with mild slowing of his rate.      Review of Systems: As mentioned in the history of present illness. All other systems reviewed and are negative. Past Medical History:  Diagnosis Date   CAD (coronary artery disease)    Diverticulitis    GERD (gastroesophageal reflux disease)    HLD (hyperlipidemia)    HTN (hypertension)    Hypothyroidism    Past Surgical History:  Procedure Laterality Date   ANKLE FRACTURE SURGERY     CHOLECYSTECTOMY     CORONARY STENT PLACEMENT     FEMUR FRACTURE SURGERY     WRIST FRACTURE SURGERY     Social History:  reports that she has never smoked. She has never used smokeless tobacco. She reports that she does not drink alcohol and does  not use drugs.  Allergies  Allergen Reactions   Amoxicillin Other (See Comments)    unknown   Hydrocodone     Confusion and AMS    Family History  Problem Relation Age of Onset   Stomach cancer Neg Hx     Prior to Admission medications   Medication Sig Start Date End Date Taking? Authorizing Provider  acetaminophen (TYLENOL) 325 MG tablet Take 650 mg by mouth every 8 (eight) hours as needed for mild pain (for pain).    [provider]  acetaminophen (TYLENOL) 500 MG tablet Take 1,000 mg by mouth at bedtime.    [provider]  apixaban (ELIQUIS) 2.5 MG TABS tablet Take 1 tablet (2.5 mg total) by mouth 2 (two) times daily. 11/30/20   Ghimire, Henreitta Leber, MD  Cholecalciferol 25 MCG (1000 UT) capsule Take 1,000 Units by mouth daily.    [provider]  diclofenac Sodium (VOLTAREN) 1 % GEL Apply 4 g topically in the morning and at bedtime. Apply to right side of neck 10/23/20   [provider]  diltiazem (CARDIZEM SR) 60 MG 12 hr capsule Take 60 mg by mouth 2 (two) times daily.    [provider]  docusate sodium (COLACE) 100 MG capsule Take 1 capsule (100 mg total) by mouth daily as needed for mild constipation. Give 100mg  by mouth once every 2  days for constipation 02/12/21   Sheikh, Omair Latif, DO  famotidine (PEPCID) 20 MG tablet Take 1 tablet (20 mg total) by mouth daily. 02/23/21   Raiford Noble Latif, DO  feeding supplement (ENSURE ENLIVE / ENSURE PLUS) LIQD Take 237 mLs by mouth 2 (two) times daily between meals. 02/12/21   Sheikh, Omair Latif, DO  fluticasone (FLONASE) 50 MCG/ACT nasal spray Place 1 spray into both nostrils daily. 08/07/20   [provider]  folic acid (FOLVITE) Q000111Q MCG tablet Take 800 mcg by mouth daily.    [provider]  gabapentin (NEURONTIN) 100 MG capsule Take 100 mg by mouth at bedtime.    [provider]  levothyroxine (SYNTHROID) 25 MCG tablet Take 25 mcg by mouth daily before breakfast.     [provider]  melatonin 5 MG TABS Take 5 mg by mouth at bedtime.    [provider]  nitroGLYCERIN (NITROSTAT) 0.4 MG SL tablet Place 0.4 mg under the tongue every 5 (five) minutes as needed for chest pain. 08/07/20   [provider]  ondansetron (ZOFRAN) 4 MG tablet Take 1 tablet (4 mg total) by mouth every 6 (six) hours as needed for nausea. 02/12/21   Raiford Noble Latif, DO  pravastatin (PRAVACHOL) 80 MG tablet Take 80 mg by mouth at bedtime. 10/25/20   [provider]  saccharomyces boulardii (FLORASTOR) 250 MG capsule Take 1 capsule (250 mg total) by mouth 2 (two) times daily. 02/12/21   Kerney Elbe, DO    Physical Exam: Vitals:   04/14/21 1200 04/14/21 1230 04/14/21 1300 04/14/21 1330  BP: (!) 126/57 123/72 (!) 122/57 (!) 133/52  Pulse: (!) 45 (!) 43 (!) 56 (!) 41  Resp: 20 20  19   Temp:      TempSrc:      SpO2: 98% 97% 98% 96%  Weight:      Height:       General:  Appears calm and comfortable and is in NAD Eyes:  PERRL, EOMI, normal lids, iris ENT:  grossly normal hearing, lips & tongue, mmm Neck:  no LAD, masses or thyromegaly Cardiovascular:  Irregularly irregular with tachycardia and then bradycardia. No LE edema.  Respiratory:   CTA bilaterally with no wheezes/rales/rhonchi.  Mildly increased respiratory effort. Abdomen:  soft, NT, ND Skin:  no rash or induration seen on limited exam Musculoskeletal:  grossly normal tone BUE/BLE, good ROM, no bony abnormality; mild TTP along L mid-humerus and L shoulder Psychiatric:  grossly normal mood and affect, speech fluent and appropriate, AOx3 Neurologic:  CN 2-12 grossly intact, moves all extremities in coordinated fashion   Radiological Exams on Admission: Independently reviewed - see discussion in A/P where applicable  DG Chest 2 View  Result Date: 04/14/2021 CLINICAL DATA:  Chest pain EXAM: CHEST - 2 VIEW COMPARISON:  02/09/2021 FINDINGS: Small left pleural effusion is unchanged.  Minimal left basilar atelectasis or scarring. Lungs are otherwise clear. No pneumothorax. No pleural effusion on the right. Mild cardiomegaly is stable. Pulmonary vascularity is normal. Osseous structures are age-appropriate. IMPRESSION: Stable small left pleural effusion. Stable cardiomegaly. Electronically Signed   By: Fidela Salisbury M.D.   On: 04/14/2021 03:50   DG Shoulder Left  Result Date: 04/14/2021 CLINICAL DATA:  Left shoulder pain. EXAM: LEFT SHOULDER - 2+ VIEW COMPARISON:  Chest CT and portable chest both 02/09/2021. FINDINGS: There is chronic healed fracture deformity again noted of the proximal left humerus with bone-on-bone joint space loss inferiorly in the glenohumeral joint with  inferior osteophytes. There is additionally partial loss of the acromial humeral space suggesting chronic rotator cuff arthropathy. Slight spurring AC joint is again noted and there is osteopenia with no evidence of fractures. 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 Mild degenerative change AC joint. Osteopenia. Likely chronic rotator cuff arthropathy as well. Electronically Signed   By: Telford Nab M.D.   On: 04/14/2021 03:52   DG Humerus Left  Result Date: 04/14/2021 CLINICAL DATA:  Left arm and shoulder pain. EXAM: LEFT HUMERUS - 2+ VIEW COMPARISON:  Chest CT 02/09/2021.  Portable chest 02/09/2021. FINDINGS: Osteopenia. No fracture is evident. There is a chronic healed fracture deformity of the proximal left humerus with joint space loss with bone-on-bone apposition of the inferior glenohumeral joint with inferior osteophytes. There is slight spurring of the [facility] joint without dislocation. There is a partial loss of the acromiohumeral space which may reflect changes of chronic rotator cuff arthropathy. 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 Likely chronic rotator  cuff arthropathy as well. Osteopenia. Electronically Signed   By: Telford Nab M.D.   On: 04/14/2021 03:57    EKG: Independently reviewed.  Afib with RVR with rate 142; no evidence of acute ischemia   Labs on Admission: I have personally reviewed the available labs and imaging studies at the time of the admission.  Pertinent labs:    Normal BMP HS troponin 7, 7 Unremarkable CBC    Assessment and Plan: * Atrial fibrillation with rapid ventricular response (HCC)- (present on admission) -Patient with known h/o afib -She was in RVR upon presentation and was given Cardizem and then started on Cardizem drip -At the time of my evaluation this AM, we discussed options including admission vs giving home PO Cardizem and transitioning off drip so she could return home -She preferred to NOT be admitted -She was still having mild RVR on Cardizem drip but became transiently mildly bradycardic after being given PO Cardizem -She has stabilized and was able to get up and walk -She feels ready for dc -Admission has been canceled -Continue Cardizem and Eliquis  Left upper arm pain- (present on admission) -She has chronic R neck pain and L shoulder pain that she uses Voltaren cream on with some improvement -She also has L humerus pain from prior fracture -Imaging today unremarkable -Will give Lidocaine patch to see if this helps to control her pain -Recommend PCP discussion regarding pain medication options as well as sleep medication  CAD (coronary artery disease)- (present on admission) -s/p stent -Negative troponin -Suspect that chest tightness was related to afib with RVR rather than ACS -No further evaluation is needed currently -She is scheduled to see cardiology on Wednesday  HLD (hyperlipidemia)- (present on admission) -Continue Pravachol  DNR (do not resuscitate)/DNI(Do Not Intubate)- (present on admission) -Patient is DNR and has form  HTN (hypertension)- (present on  admission) -Cardizem was resumed       Advance Care Planning:  DNR  Consults: None  DVT Prophylaxis: Eliquis  Family Communication: Daughter was present throughout and fully agrees with this plan.    Author: Karmen Bongo, MD 04/14/2021 2:04 PM  For on call review [email or web address].

## 2021-04-14 NOTE — ED Notes (Signed)
Dr. Yates at bedside.  

## 2021-04-14 NOTE — Assessment & Plan Note (Addendum)
-  Patient with known h/o afib -She was in RVR upon presentation and was given Cardizem and then started on Cardizem drip -At the time of my evaluation this AM, we discussed options including admission vs giving home PO Cardizem and transitioning off drip so she could return home -She preferred to NOT be admitted -She was still having mild RVR on Cardizem drip but became transiently mildly bradycardic after being given PO Cardizem -She has stabilized and was able to get up and walk -She feels ready for dc -Admission has been canceled -Continue Cardizem and Eliquis

## 2021-04-14 NOTE — Assessment & Plan Note (Signed)
-  She has chronic R neck pain and L shoulder pain that she uses Voltaren cream on with some improvement -She also has L humerus pain from prior fracture -Imaging today unremarkable -Will give Lidocaine patch to see if this helps to control her pain -Recommend PCP discussion regarding pain medication options as well as sleep medication

## 2021-04-14 NOTE — Assessment & Plan Note (Signed)
-  Continue Pravachol ?

## 2021-04-14 NOTE — ED Triage Notes (Signed)
Pt c/o L arm, shoulder, back & chest pain. Constant, progressively worsening x "a couple days." Pt denies associated nausea, vomiting, SHOB

## 2021-04-14 NOTE — ED Notes (Signed)
EKG obtained per Rhunette Croft, MD request as pts HR is in 40s-50s, afib. Yates MD and Rhunette Croft MD discussed that conversations were had with the pt regarding remaining in the hospital but pt and family are comfortable for pt to get discharged. Pts VS otherwise WNL.

## 2021-04-14 NOTE — ED Notes (Signed)
Pt verbalizes understanding of discharge instructions. Opportunity for questions and answers were provided. Pt discharged from the ED to [facility] via family member.

## 2021-04-14 NOTE — ED Notes (Signed)
Pt received breakfast tray 

## 2021-04-14 NOTE — Assessment & Plan Note (Signed)
-  Cardizem was resumed

## 2021-04-17 ENCOUNTER — Ambulatory Visit: Payer: Medicare HMO | Admitting: Cardiology

## 2021-04-17 ENCOUNTER — Encounter: Payer: Self-pay | Admitting: Cardiology

## 2021-04-17 ENCOUNTER — Other Ambulatory Visit: Payer: Self-pay

## 2021-04-17 VITALS — BP 110/60 | HR 51 | Ht 63.0 in | Wt 133.4 lb

## 2021-04-17 DIAGNOSIS — R079 Chest pain, unspecified: Secondary | ICD-10-CM | POA: Diagnosis not present

## 2021-04-17 DIAGNOSIS — I4821 Permanent atrial fibrillation: Secondary | ICD-10-CM | POA: Diagnosis not present

## 2021-04-17 DIAGNOSIS — E782 Mixed hyperlipidemia: Secondary | ICD-10-CM

## 2021-04-17 DIAGNOSIS — I2510 Atherosclerotic heart disease of native coronary artery without angina pectoris: Secondary | ICD-10-CM

## 2021-04-17 DIAGNOSIS — I480 Paroxysmal atrial fibrillation: Secondary | ICD-10-CM

## 2021-04-17 DIAGNOSIS — Z7901 Long term (current) use of anticoagulants: Secondary | ICD-10-CM

## 2021-04-17 NOTE — Progress Notes (Signed)
Cardiology Office Note:    Date:  04/22/2021   ID:  Charlotte Powell, DOB [birth year], MRN [id number]  PCP:  System, Provider Not In  Cardiologist:  Berniece Salines, DO  Electrophysiologist:  None   Referring MD: Oliver Barre, PA   " I am establishing care"  History of Present Illness:    Charlotte Powell is a 88 y.o. female with a hx of coronary artery disease status post drug-eluting stent to the RCA back in 2004, hypertension, atrial fibrillation diagnosed in 2004 and the patient and her cardiologist had previously deemed permanent atrial fibrillation and had not pursue any rhythm control she is on Eliquis 2.5 mg twice daily as well as Cardizem 60 mg twice daily.  The patient still moved to [facility] and is now establishing cardiac care.  However prior to her visit she has had episodes visits to the emergency department which during this time she was in atrial fibrillation with rapid ventricular rate.  However most of the visits were noncardiac symptoms.  She was seen in our A-fib clinic on March 21, 2021 during that time she and her daughter noted that her rate control strategy is what they would like to pursue.   She is here in office with her daughter.  She offers no complaints at this time.  Past Medical History:  Diagnosis Date   CAD (coronary artery disease)    Diverticulitis    GERD (gastroesophageal reflux disease)    HLD (hyperlipidemia)    HTN (hypertension)    Hypothyroidism     Past Surgical History:  Procedure Laterality Date   ANKLE FRACTURE SURGERY     CHOLECYSTECTOMY     CORONARY STENT PLACEMENT     FEMUR FRACTURE SURGERY     WRIST FRACTURE SURGERY      Current Medications: Current Meds  Medication Sig   acetaminophen (TYLENOL) 325 MG tablet Take 650 mg by mouth every 8 (eight) hours as needed for mild pain (for pain). Also has order for 500mg  tablets scheduled nightly   acetaminophen (TYLENOL) 500 MG tablet Take 1,000 mg by mouth at bedtime. Also has  order for 325mg  tablets as needed.   apixaban (ELIQUIS) 2.5 MG TABS tablet Take 1 tablet (2.5 mg total) by mouth 2 (two) times daily.   Cholecalciferol 25 MCG (1000 UT) capsule Take 1,000 Units by mouth daily.   diclofenac Sodium (VOLTAREN) 1 % GEL Apply 4 g topically every 12 (twelve) hours as needed (pain on the right side of the neck).   diltiazem (CARDIZEM SR) 60 MG 12 hr capsule Take 60 mg by mouth 2 (two) times daily.   docusate sodium (COLACE) 100 MG capsule Take 1 capsule (100 mg total) by mouth daily as needed for mild constipation. Give 100mg  by mouth once every 2 days for constipation (Patient taking differently: Take 100 mg by mouth every other day as needed for mild constipation.)   famotidine (PEPCID) 20 MG tablet Take 1 tablet (20 mg total) by mouth daily.   fluticasone (FLONASE) 50 MCG/ACT nasal spray Place 1 spray into both nostrils daily.   folic acid (FOLVITE) Q000111Q MCG tablet Take 800 mcg by mouth daily.   gabapentin (NEURONTIN) 100 MG capsule Take 100 mg by mouth at bedtime.   levothyroxine (SYNTHROID) 25 MCG tablet Take 25 mcg by mouth daily.   lidocaine (LIDODERM) 5 % Place 1 patch onto the skin daily. Remove & Discard patch within 12 hours or as directed by MD   melatonin 5 MG TABS  Take 5 mg by mouth at bedtime.   nitroGLYCERIN (NITROSTAT) 0.4 MG SL tablet Place 0.4 mg under the tongue every 5 (five) minutes as needed for chest pain.   ondansetron (ZOFRAN) 4 MG tablet Take 1 tablet (4 mg total) by mouth every 6 (six) hours as needed for nausea.   pravastatin (PRAVACHOL) 80 MG tablet Take 80 mg by mouth at bedtime.   saccharomyces boulardii (FLORASTOR) 250 MG capsule Take 1 capsule (250 mg total) by mouth 2 (two) times daily.     Allergies:   Amoxicillin and Hydrocodone   Social History   Socioeconomic History   Marital status: Widowed    Spouse name: Not on file   Number of children: Not on file   Years of education: Not on file   Highest education level: Not on file   Occupational History   Occupation: retired  Tobacco Use   Smoking status: Never   Smokeless tobacco: Never  Substance and Sexual Activity   Alcohol use: Never   Drug use: Never   Sexual activity: Not on file  Other Topics Concern   Not on file  Social History Narrative   Not on file   Social Determinants of Health   Financial Resource Strain: Not on file  Food Insecurity: Not on file  Transportation Needs: Not on file  Physical Activity: Not on file  Stress: Not on file  Social Connections: Not on file     Family History: The patient's family history is negative for Stomach cancer.  ROS:   Review of Systems  Constitution: Negative for decreased appetite, fever and weight gain.  HENT: Negative for congestion, ear discharge, hoarse voice and sore throat.   Eyes: Negative for discharge, redness, vision loss in right eye and visual halos.  Cardiovascular: Negative for chest pain, dyspnea on exertion, leg swelling, orthopnea and palpitations.  Respiratory: Negative for cough, hemoptysis, shortness of breath and snoring.   Endocrine: Negative for heat intolerance and polyphagia.  Hematologic/Lymphatic: Negative for bleeding problem. Does not bruise/bleed easily.  Skin: Negative for flushing, nail changes, rash and suspicious lesions.  Musculoskeletal: Negative for arthritis, joint pain, muscle cramps, myalgias, neck pain and stiffness.  Gastrointestinal: Negative for abdominal pain, bowel incontinence, diarrhea and excessive appetite.  Genitourinary: Negative for decreased libido, genital sores and incomplete emptying.  Neurological: Negative for brief paralysis, focal weakness, headaches and loss of balance.  Psychiatric/Behavioral: Negative for altered mental status, depression and suicidal ideas.  Allergic/Immunologic: Negative for HIV exposure and persistent infections.    EKGs/Labs/Other Studies Reviewed:    The following studies were reviewed today:   EKG:  The ekg  ordered today demonstrates atrial fibrillation with slow Ventricular rate heart rate 51 beats minute.    Echo 02/10/21 demonstrated   1. Left ventricular ejection fraction, by estimation, is 65 to 70%. The  left ventricle has normal function. The left ventricle has no regional  wall motion abnormalities. Left ventricular diastolic parameters are  indeterminate.   2. Right ventricular systolic function is normal. The right ventricular  size is normal. There is normal pulmonary artery systolic pressure. The  estimated right ventricular systolic pressure is Q000111Q mmHg.   3. Left atrial size was mildly dilated.   4. The mitral valve is abnormal. Moderate mitral valve regurgitation.   5. Tricuspid valve regurgitation is mild to moderate.   6. The aortic valve is tricuspid. Aortic valve regurgitation is not  visualized. Aortic valve sclerosis/calcification is present, without any  evidence of aortic stenosis.  7. The inferior vena cava is normal in size with <50% respiratory  variability, suggesting right atrial pressure of 8 mmHg.   8. Left pleural effusion present.    Epic records are reviewed at length today  Recent Labs: 11/27/2020: TSH 4.119 02/09/2021: B Natriuretic Peptide 356.9 02/12/2021: ALT 13; Magnesium 1.9 04/14/2021: BUN 14; Creatinine, Ser 0.78; Hemoglobin 14.6; Platelets 157; Potassium 4.1; Sodium 141  Recent Lipid Panel No results found for: CHOL, TRIG, HDL, CHOLHDL, VLDL, LDLCALC, LDLDIRECT  Physical Exam:    VS:  BP 110/60 (BP Location: Left Arm, Patient Position: Sitting)    Pulse (!) 51    Ht 5\' 3"  (1.6 m)    Wt 133 lb 6.4 oz (60.5 kg)    SpO2 98%    BMI 23.63 kg/m     Wt Readings from Last 3 Encounters:  04/17/21 133 lb 6.4 oz (60.5 kg)  04/14/21 130 lb (59 kg)  03/21/21 130 lb 9.6 oz (59.2 kg)     GEN: Well nourished, well developed in no acute distress HEENT: Normal NECK: No JVD; No carotid bruits LYMPHATICS: No lymphadenopathy CARDIAC: S1S2 noted,RRR,  no murmurs, rubs, gallops RESPIRATORY:  Clear to auscultation without rales, wheezing or rhonchi  ABDOMEN: Soft, non-tender, non-distended, +bowel sounds, no guarding. EXTREMITIES: No edema, No cyanosis, no clubbing MUSCULOSKELETAL:  No deformity  SKIN: Warm and dry NEUROLOGIC:  Alert and oriented x 3, non-focal PSYCHIATRIC:  Normal affect, good insight  ASSESSMENT:    1. Atrial fibrillation, permanent ([place])   2. Chest pain of uncertain etiology   3. Coronary artery disease involving native coronary artery of native heart without angina pectoris   4. Mixed hyperlipidemia   5. Chronic anticoagulation    PLAN:    Permanent atrial fibrillation -we will continue with rate control strategy.  Continue patient on her anticoagulation with Eliquis.  CAD-no anginal symptoms  Hyperlipidemia - continue with current statin medication.   The patient is in agreement with the above plan. The patient left the office in stable condition.  The patient will follow up in 6 months or sooner if needed   Medication Adjustments/Labs and Tests Ordered: Current medicines are reviewed at length with the patient today.  Concerns regarding medicines are outlined above.  Orders Placed This Encounter  Procedures   EKG 12-Lead   No orders of the defined types were placed in this encounter.   Patient Instructions  Medication Instructions:  Your physician recommends that you continue on your current medications as directed. Please refer to the Current Medication list given to you today.  *If you need a refill on your cardiac medications before your next appointment, please call your pharmacy*   Lab Work: None If you have labs (blood work) drawn today and your tests are completely normal, you will receive your results only by: [place] (if you have MyChart) OR A paper copy in the mail If you have any lab test that is abnormal or we need to change your treatment, we will call you to review the  results.   Testing/Procedures: None   Follow-Up: At [facility], you and your health needs are our priority.  As part of our continuing mission to provide you with exceptional heart care, we have created designated Provider Care Teams.  These Care Teams include your primary Cardiologist (physician) and Advanced Practice Providers (APPs -  Physician Assistants and Nurse Practitioners) who all work together to provide you with the care you need, when you need it.  We  recommend signing up for the patient portal called "MyChart".  Sign up information is provided on this After Visit Summary.  MyChart is used to connect with patients for Virtual Visits (Telemedicine).  Patients are able to view lab/test results, encounter notes, upcoming appointments, etc.  Non-urgent messages can be sent to your provider as well.   To learn more about what you can do with MyChart, go to [email or web address].    Your next appointment:   6 month(s)  The format for your next appointment:   In Person  Provider:   Berniece Salines, DO         Adopting a Healthy Lifestyle.  Know what a healthy weight is for you (roughly BMI <25) and aim to maintain this   Aim for 7+ servings of fruits and vegetables daily   65-80+ fluid ounces of water or unsweet tea for healthy kidneys   Limit to max 1 drink of alcohol per day; avoid smoking/tobacco   Limit animal fats in diet for cholesterol and heart health - choose grass fed whenever available   Avoid highly processed foods, and foods high in saturated/trans fats   Aim for low stress - take time to unwind and care for your mental health   Aim for 150 min of moderate intensity exercise weekly for heart health, and weights twice weekly for bone health   Aim for 7-9 hours of sleep daily   When it comes to diets, agreement about the perfect plan isnt easy to find, even among the experts. Experts at the [place] developed an idea known as  the Healthy Eating Plate. Just imagine a plate divided into logical, healthy portions.   The emphasis is on diet quality:   Load up on vegetables and fruits - one-half of your plate: Aim for color and variety, and remember that potatoes dont count.   Go for whole grains - one-quarter of your plate: Whole wheat, barley, wheat berries, quinoa, oats, brown rice, and foods made with them. If you want pasta, go with whole wheat pasta.   Protein power - one-quarter of your plate: Fish, chicken, beans, and nuts are all healthy, versatile protein sources. Limit red meat.   The diet, however, does go beyond the plate, offering a few other suggestions.   Use healthy plant oils, such as olive, canola, soy, corn, sunflower and peanut. Check the labels, and avoid partially hydrogenated oil, which have unhealthy trans fats.   If youre thirsty, drink water. Coffee and tea are good in moderation, but skip sugary drinks and limit milk and dairy products to one or two daily servings.   The type of carbohydrate in the diet is more important than the amount. Some sources of carbohydrates, such as vegetables, fruits, whole grains, and beans-are healthier than others.   Finally, stay active  Signed, Berniece Salines, DO  04/22/2021 11:31 AM    [place]

## 2021-04-17 NOTE — Patient Instructions (Addendum)
Medication Instructions:  Your physician recommends that you continue on your current medications as directed. Please refer to the Current Medication list given to you today.  *If you need a refill on your cardiac medications before your next appointment, please call your pharmacy*   Lab Work: None If you have labs (blood work) drawn today and your tests are completely normal, you will receive your results only by: MyChart Message (if you have MyChart) OR A paper copy in the mail If you have any lab test that is abnormal or we need to change your treatment, we will call you to review the results.   Testing/Procedures: None   Follow-Up: At [facility], you and your health needs are our priority.  As part of our continuing mission to provide you with exceptional heart care, we have created designated Provider Care Teams.  These Care Teams include your primary Cardiologist (physician) and Advanced Practice Providers (APPs -  Physician Assistants and Nurse Practitioners) who all work together to provide you with the care you need, when you need it.  We recommend signing up for the patient portal called "MyChart".  Sign up information is provided on this After Visit Summary.  MyChart is used to connect with patients for Virtual Visits (Telemedicine).  Patients are able to view lab/test results, encounter notes, upcoming appointments, etc.  Non-urgent messages can be sent to your provider as well.   To learn more about what you can do with MyChart, go to [email or web address].    Your next appointment:   6 month(s)  The format for your next appointment:   In Person  Provider:   Kardie Tobb, DO     

## 2021-04-22 DIAGNOSIS — I480 Paroxysmal atrial fibrillation: Secondary | ICD-10-CM | POA: Insufficient documentation

## 2021-04-22 DIAGNOSIS — Z7901 Long term (current) use of anticoagulants: Secondary | ICD-10-CM | POA: Insufficient documentation

## 2021-04-22 DIAGNOSIS — R079 Chest pain, unspecified: Secondary | ICD-10-CM | POA: Insufficient documentation

## 2021-04-22 DIAGNOSIS — I2510 Atherosclerotic heart disease of native coronary artery without angina pectoris: Secondary | ICD-10-CM | POA: Insufficient documentation

## 2021-07-08 ENCOUNTER — Emergency Department (HOSPITAL_COMMUNITY): Payer: Medicare HMO

## 2021-07-08 ENCOUNTER — Encounter (HOSPITAL_COMMUNITY): Payer: Self-pay

## 2021-07-08 ENCOUNTER — Emergency Department (HOSPITAL_COMMUNITY)
Admission: EM | Admit: 2021-07-08 | Discharge: 2021-07-08 | Disposition: A | Discharge status: Home / Self Care | Payer: Medicare HMO | Attending: Emergency Medicine | Admitting: Emergency Medicine

## 2021-07-08 ENCOUNTER — Other Ambulatory Visit: Payer: Self-pay

## 2021-07-08 DIAGNOSIS — G8929 Other chronic pain: Secondary | ICD-10-CM | POA: Insufficient documentation

## 2021-07-08 DIAGNOSIS — R519 Headache, unspecified: Secondary | ICD-10-CM | POA: Insufficient documentation

## 2021-07-08 DIAGNOSIS — E039 Hypothyroidism, unspecified: Secondary | ICD-10-CM | POA: Diagnosis not present

## 2021-07-08 DIAGNOSIS — I10 Essential (primary) hypertension: Secondary | ICD-10-CM | POA: Insufficient documentation

## 2021-07-08 DIAGNOSIS — M25511 Pain in right shoulder: Secondary | ICD-10-CM | POA: Insufficient documentation

## 2021-07-08 DIAGNOSIS — M79661 Pain in right lower leg: Secondary | ICD-10-CM | POA: Insufficient documentation

## 2021-07-08 DIAGNOSIS — M25512 Pain in left shoulder: Secondary | ICD-10-CM | POA: Diagnosis not present

## 2021-07-08 DIAGNOSIS — W19XXXA Unspecified fall, initial encounter: Secondary | ICD-10-CM

## 2021-07-08 DIAGNOSIS — Z7902 Long term (current) use of antithrombotics/antiplatelets: Secondary | ICD-10-CM | POA: Insufficient documentation

## 2021-07-08 MED ORDER — ACETAMINOPHEN 325 MG PO TABS
650.0000 mg | ORAL_TABLET | Freq: Once | ORAL | Status: AC
  Administered 2021-07-08: 650 mg via ORAL
  Filled 2021-07-08: qty 2

## 2021-07-08 NOTE — ED Notes (Signed)
Pt was able to ambulate with a walker with no further assistance. Pt did not have any dizziness, pain, or trouble walking.

## 2021-07-08 NOTE — ED Notes (Signed)
Ambulated with walker, tolerated well.

## 2021-07-08 NOTE — ED Provider Notes (Signed)
[facility] EMERGENCY DEPARTMENT Provider Note   CSN: [id number] Arrival date & time: 07/08/21  [id number]     History  Chief Complaint  Patient presents with   Lytle Michaels    Charlotte Powell is a 88 y.o. female with a history of CAD, HTN, HLD, atrial fibrillation on Eliquis, hypothyroidism presenting to the ED after a mechanical fall.  Patient states that she was walking with her walker and leaned over to try to open the blinds, losing her balance and falling forward.  She states that she hit her nose on a vase in a cabinet.  She does not think she hit her head.  She did not lose consciousness.  She is complaining of bilateral shoulder pain which is chronic.  She is also complaining of pain over her right shin and pain over her nose, both of which are new.   Fall Pertinent negatives include no chest pain, no abdominal pain and no shortness of breath.      Home Medications Prior to Admission medications   Medication Sig Start Date End Date Taking? Authorizing Provider  acetaminophen (TYLENOL) 325 MG tablet Take 650 mg by mouth every 8 (eight) hours as needed for mild pain (for pain). Also has order for 500mg  tablets scheduled nightly    [provider]  acetaminophen (TYLENOL) 500 MG tablet Take 1,000 mg by mouth at bedtime. Also has order for 325mg  tablets as needed.    [provider]  apixaban (ELIQUIS) 2.5 MG TABS tablet Take 1 tablet (2.5 mg total) by mouth 2 (two) times daily. 11/30/20   Ghimire, Henreitta Leber, MD  Cholecalciferol 25 MCG (1000 UT) capsule Take 1,000 Units by mouth daily.    [provider]  diclofenac Sodium (VOLTAREN) 1 % GEL Apply 4 g topically every 12 (twelve) hours as needed (pain on the right side of the neck). 10/23/20   [provider]  diltiazem (CARDIZEM SR) 60 MG 12 hr capsule Take 60 mg by mouth 2 (two) times daily.    [provider]  docusate sodium (COLACE) 100 MG capsule Take 1 capsule (100 mg total) by  mouth daily as needed for mild constipation. Give 100mg  by mouth once every 2 days for constipation Patient taking differently: Take 100 mg by mouth every other day as needed for mild constipation. 02/12/21   Raiford Noble Latif, DO  famotidine (PEPCID) 20 MG tablet Take 1 tablet (20 mg total) by mouth daily. 02/23/21   Sheikh, Omair Latif, DO  fluticasone (FLONASE) 50 MCG/ACT nasal spray Place 1 spray into both nostrils daily. 08/07/20   [provider]  folic acid (FOLVITE) [id number] MCG tablet Take 800 mcg by mouth daily.    [provider]  gabapentin (NEURONTIN) 100 MG capsule Take 100 mg by mouth at bedtime.    [provider]  levothyroxine (SYNTHROID) 25 MCG tablet Take 25 mcg by mouth daily.    [provider]  lidocaine (LIDODERM) 5 % Place 1 patch onto the skin daily. Remove & Discard patch within 12 hours or as directed by MD 04/15/21   Karmen Bongo, MD  melatonin 5 MG TABS Take 5 mg by mouth at bedtime.    [provider]  nitroGLYCERIN (NITROSTAT) 0.4 MG SL tablet Place 0.4 mg under the tongue every 5 (five) minutes as needed for chest pain. 08/07/20   [provider]  ondansetron (ZOFRAN) 4 MG tablet Take 1 tablet (4 mg total) by mouth every 6 (six) hours as  needed for nausea. 02/12/21   Raiford Noble Latif, DO  pravastatin (PRAVACHOL) 80 MG tablet Take 80 mg by mouth at bedtime. 10/25/20   [provider]  saccharomyces boulardii (FLORASTOR) 250 MG capsule Take 1 capsule (250 mg total) by mouth 2 (two) times daily. 02/12/21   Raiford Noble Latif, DO      Allergies    Amoxicillin and Hydrocodone    Review of Systems   Review of Systems  Constitutional:  Negative for fever.  Respiratory:  Negative for shortness of breath.   Cardiovascular:  Negative for chest pain.  Gastrointestinal:  Negative for abdominal pain and vomiting.  Musculoskeletal:  Negative for back pain and neck pain.       Positive for right shin pain.   Neurological:  Negative for seizures, syncope and light-headedness.   Physical Exam Updated Vital Signs BP 138/89   Pulse 72   Temp 98.2 F (36.8 C) (Oral)   Resp (!) 24   Ht 5\' 3"  (1.6 m)   Wt 59 kg   SpO2 96%   BMI 23.03 kg/m  Physical Exam Constitutional:      General: She is not in acute distress.    Appearance: She is not toxic-appearing or diaphoretic.     Comments: Elderly chronically ill-appearing.  HENT:     Head: Normocephalic and atraumatic.     Right Ear: External ear normal.     Left Ear: External ear normal.     Nose:     Comments: Mild tenderness to palpation over the nasal bridge without crepitus or deformity.    Mouth/Throat:     Mouth: Mucous membranes are moist.  Eyes:     General: No scleral icterus.    Pupils: Pupils are equal, round, and reactive to light.  Neck:     Comments: No midline C-spine tenderness, step-offs, or deformities. Cardiovascular:     Rate and Rhythm: Normal rate and regular rhythm.     Heart sounds: Normal heart sounds. No murmur heard.   No friction rub. No gallop.  Pulmonary:     Effort: Pulmonary effort is normal. No respiratory distress.     Breath sounds: Normal breath sounds. No stridor. No wheezing, rhonchi or rales.  Abdominal:     Palpations: Abdomen is soft.     Tenderness: There is no abdominal tenderness. There is no guarding or rebound.  Musculoskeletal:        General: No deformity.     Cervical back: Neck supple.     Comments: No point tenderness over the bilateral shoulders.  She has full range of motion of both shoulders.  Skin:    General: Skin is warm and dry.     Comments: Anterior to the right shin with underlying hematoma.  Neurological:     General: No focal deficit present.     Mental Status: She is alert. Mental status is at baseline.    ED Results / Procedures / Treatments   Labs (all labs ordered are listed, but only abnormal results are displayed) Labs Reviewed - No data to  display  EKG None  Radiology DG Tibia/Fibula Right  Result Date: 07/08/2021 CLINICAL DATA:  Fall, skin tear and hematoma along the right lower leg EXAM: RIGHT TIBIA AND FIBULA - 2 VIEW COMPARISON:  None Available. FINDINGS: Mild bony demineralization. Vascular calcifications noted. Mild to moderate osteoarthritis of the knee. No fracture or acute bony findings. IMPRESSION: 1. No fracture or acute bony findings. 2. Mild bony demineralization. 3.  Mild to moderate osteoarthritis of the knee. Electronically Signed   By: Van Clines M.D.   On: 07/08/2021 16:59   CT Head Wo Contrast  Result Date: 07/08/2021 CLINICAL DATA:  [place], anticoagulated EXAM: CT HEAD WITHOUT CONTRAST TECHNIQUE: Contiguous axial images were obtained from the base of the skull through the vertex without intravenous contrast. RADIATION DOSE REDUCTION: This exam was performed according to the departmental dose-optimization program which includes automated exposure control, adjustment of the mA and/or kV according to patient size and/or use of iterative reconstruction technique. COMPARISON:  11/27/2020 FINDINGS: Brain: No acute infarct or hemorrhage. Stable hypodensities throughout the periventricular white matter consistent with chronic small vessel ischemic changes. Lateral ventricles and midline structures are otherwise unremarkable. No acute extra-axial fluid collections. No mass effect. Vascular: No hyperdense vessel or unexpected calcification. Skull: Normal. Negative for fracture or focal lesion. Sinuses/Orbits: No acute finding. Other: None. IMPRESSION: 1. Stable head CT, no acute intracranial process. Electronically Signed   By: Randa Ngo M.D.   On: 07/08/2021 17:04   CT Cervical Spine Wo Contrast  Result Date: 07/08/2021 CLINICAL DATA:  Provided history: Neck trauma. Additional history provided: Fall. EXAM: CT CERVICAL SPINE WITHOUT CONTRAST TECHNIQUE: Multidetector CT imaging of the cervical spine was performed  without intravenous contrast. Multiplanar CT image reconstructions were also generated. RADIATION DOSE REDUCTION: This exam was performed according to the departmental dose-optimization program which includes automated exposure control, adjustment of the mA and/or kV according to patient size and/or use of iterative reconstruction technique. COMPARISON:  None. FINDINGS: Alignment: Straightening of the expected cervical lordosis. Mild cervicothoracic dextrocurvature. Trace grade 1 anterolisthesis at C3-C4 and C4-C5. 2 mm grade 1 anterolisthesis at C7-T1. 2 mm grade 1 anterolisthesis at T2-T3. Skull base and vertebrae: The basion-dental and atlanto-dental intervals are maintained.No evidence of acute fracture to the cervical spine. Soft tissues and spinal canal: No prevertebral fluid or swelling. No visible canal hematoma. Disc levels: Cervical spondylosis with multilevel disc space narrowing, disc bulges/central disc protrusions, posterior disc osteophytes, endplate spurring, uncovertebral hypertrophy and facet arthrosis. Disc space narrowing is greatest at C5-C6 and C6-C7. Multilevel spinal canal stenosis. Most notably, a posterior disc osteophyte complex contributes to apparent mild-to-moderate spinal canal stenosis. Multilevel bony neural foraminal narrowing. Advanced degenerative changes are also present at the C1-C2 articulation on the right. Ligamentous hypertrophy also present at the C1-C2 articulation. Ventral osteophytes at C2-C3, C5-C6 and C6-C7. Upper chest: Consolidation within the imaged lung apices. No visible pneumothorax. Other: Atherosclerotic plaque within the visualized aortic arch, proximal major branch vessels of the neck and carotid arteries. IMPRESSION: No evidence of acute fracture to the cervical spine. Nonspecific straightening of the expected cervical lordosis. Mild cervicothoracic dextrocurvature. Mild grade 1 anterolisthesis at C3-C4, C4-C5, C7-T1 and T2-T3. Cervical spondylosis, as  outlined. Aortic Atherosclerosis (ICD10-I70.0). Electronically Signed   By: Kellie Simmering D.O.   On: 07/08/2021 17:56   DG Pelvis Portable  Result Date: 07/08/2021 CLINICAL DATA:  Fall. EXAM: PORTABLE PELVIS 1-2 VIEWS COMPARISON:  CT examination dated November 16, 2020 FINDINGS: Cephalomedullary nail placement for prior intertrochanteric fracture. Evidence of acute fracture. Degenerate disc disease of the lower lumbar spine. Osteopenia. IMPRESSION: 1. No evidence of acute fracture or dislocation. 2. Left hip cephalo medullary nail placement for prior fracture. If patient is unable to bear weight and clinical concern for a fracture, cross-sectional imaging could be obtained for further evaluation. Electronically Signed   By: Keane Police D.O.   On: 07/08/2021 16:58   DG Chest Portable 1  View  Result Date: 07/08/2021 CLINICAL DATA:  Fall forward to the ground. EXAM: PORTABLE CHEST 1 VIEW COMPARISON:  04/14/2021 FINDINGS: Stable mild enlargement of the cardiopericardial silhouette. Atherosclerotic calcification of the aortic arch. Chronic mild blunting of the left lateral costophrenic angle. The lungs appear clear. Chronic deformity left proximal humerus and advanced degenerative AC joint arthropathy. Lucency projects over the left proximal humerus, I favor a skin fold but if the patient has pain in the left shoulder region then dedicated shoulder radiography would be recommended to exclude acute fracture superimposed on the chronic deformity. IMPRESSION: 1. Chronic deformity left proximal humerus. There is some linear lucency along the left proximal humerus which may well be from a skin fold, but if the patient has left shoulder pain, dedicated left shoulder radiography would be recommended to exclude an acute fracture superimposed on the chronic deformity. 2. Stable mild enlargement of the cardiopericardial silhouette, without edema. 3.  Aortic Atherosclerosis (ICD10-I70.0). 4. Chronic blunting of the left  lateral costophrenic angle probably from a small pleural effusion. Electronically Signed   By: Van Clines M.D.   On: 07/08/2021 16:57   DG Shoulder Left  Result Date: 07/08/2021 CLINICAL DATA:  Fall.  Bilateral shoulder pain. EXAM: LEFT SHOULDER - 2+ VIEW; LEFT HUMERUS - 2+ VIEW COMPARISON:  Left shoulder and humerus radiographs 04/14/2021 FINDINGS: Left shoulder: Severe inferior glenohumeral joint space narrowing with bone-on-bone contact and subchondral sclerosis, unchanged. Large inferior humeral head-neck junction degenerative osteophytosis and moderate medial head cortical flattening/remodeling, unchanged. Moderate superior humeral head degenerative osteophytes. Moderate acromioclavicular joint space narrowing and peripheral osteophytosis. No definite acute fracture is seen. No dislocation. Mild calcification within aortic arch. Left humerus: There is unchanged chronic mild deformity of the left proximal humeral surgical neck, likely the sequela of remote trauma. Within the limitations of diffuse decreased bone mineralization, no definite acute fracture is seen. IMPRESSION: 1. Within the limitations of diffuse decreased bone mineralization, no acute fracture is seen. 2. Unchanged severe glenohumeral osteoarthritis. 3. Unchanged medial humeral head cortical flattening/remodeling. 4. Unchanged proximal humeral surgical neck mild remote posttraumatic deformity. Electronically Signed   By: Yvonne Kendall M.D.   On: 07/08/2021 17:40   DG Humerus Left  Result Date: 07/08/2021 CLINICAL DATA:  Fall.  Bilateral shoulder pain. EXAM: LEFT SHOULDER - 2+ VIEW; LEFT HUMERUS - 2+ VIEW COMPARISON:  Left shoulder and humerus radiographs 04/14/2021 FINDINGS: Left shoulder: Severe inferior glenohumeral joint space narrowing with bone-on-bone contact and subchondral sclerosis, unchanged. Large inferior humeral head-neck junction degenerative osteophytosis and moderate medial head cortical flattening/remodeling,  unchanged. Moderate superior humeral head degenerative osteophytes. Moderate acromioclavicular joint space narrowing and peripheral osteophytosis. No definite acute fracture is seen. No dislocation. Mild calcification within aortic arch. Left humerus: There is unchanged chronic mild deformity of the left proximal humeral surgical neck, likely the sequela of remote trauma. Within the limitations of diffuse decreased bone mineralization, no definite acute fracture is seen. IMPRESSION: 1. Within the limitations of diffuse decreased bone mineralization, no acute fracture is seen. 2. Unchanged severe glenohumeral osteoarthritis. 3. Unchanged medial humeral head cortical flattening/remodeling. 4. Unchanged proximal humeral surgical neck mild remote posttraumatic deformity. Electronically Signed   By: Yvonne Kendall M.D.   On: 07/08/2021 17:40   CT Maxillofacial Wo Contrast  Result Date: 07/08/2021 CLINICAL DATA:  Facial trauma, blunt. Additional history provided: Fall on thinners. EXAM: CT MAXILLOFACIAL WITHOUT CONTRAST TECHNIQUE: Multidetector CT imaging of the maxillofacial structures was performed. Multiplanar CT image reconstructions were also generated. RADIATION DOSE REDUCTION: This  exam was performed according to the departmental dose-optimization program which includes automated exposure control, adjustment of the mA and/or kV according to patient size and/or use of iterative reconstruction technique. COMPARISON:  Same day head CT 07/08/2021.  Head CT 11/27/2020. FINDINGS: Osseous: No acute maxillofacial fracture is identified. Orbits: No acute orbital finding. Sinuses: Minimal mucosal thickening within the bilateral ethmoid and left sphenoid sinuses. Soft tissues: Nasal soft tissue swelling. Limited intracranial: Separately reported on concurrently performed noncontrast head CT. Other: Bilateral TMJ osteoarthrosis. IMPRESSION: No evidence of acute maxillofacial fracture. Nasal soft tissue swelling. Minimal  mucosal thickening within the bilateral ethmoid and left sphenoid sinuses. Electronically Signed   By: Kellie Simmering D.O.   On: 07/08/2021 17:14    Procedures Procedures    Medications Ordered in ED Medications  acetaminophen (TYLENOL) tablet 650 mg (650 mg Oral Given 07/08/21 1937)    ED Course/ Medical Decision Making/ A&P                           Medical Decision Making Amount and/or Complexity of Data Reviewed Radiology: ordered.  Risk OTC drugs.   Retta Saliba is a 88 y.o. female with a history of CAD, HTN, HLD, atrial fibrillation on Eliquis, hypothyroidism presenting to the ED after a mechanical fall.  On exam, the patient is alert, able to answer questions, and has equal breath sounds bilaterally.  She does have some tenderness over the nasal bridge.  She also has a skin tear to the right shin with underlying hematoma.  Otherwise she has no other obvious injuries.  Patient describes a mechanical fall no concern for syncope or other concerning pathology.  Given her age, will obtain a CT of the head and a CT C-spine.  Chest x-ray, pelvis x-ray were also obtained.  She was given Tylenol for pain.  Chest x-ray without any acute intrathoracic abnormality.  Does show a chronic deformity of the left proximal humerus, but was difficult to tell if there is a new fracture.  Dedicated x-ray of the left shoulder and left humerus was obtained which does not show any acute fracture.  CT of the head without acute intracranial abnormality.  Pelvis x-ray also negative for acute traumatic injury.  Given the patient's negative work-up, I do feel she is safe for discharge.  Patient was ambulated in the room with a walker (she typically walks with a walker and lives at an assisted living facility).  Strict return precautions discussed.  She was discharged back to her facility in stable condition.        Final Clinical Impression(s) / ED Diagnoses Final diagnoses:  Fall, initial  encounter    Rx / DC Orders ED Discharge Orders     None         Sondra Come, MD 07/08/21 [id number]    Elnora Morrison, MD 07/08/21 [phone]

## 2021-07-08 NOTE — ED Triage Notes (Addendum)
Pt BIB [facility] from [facility] c/o a fall. Pt was walking with her walker and wanted to open the blinds over extended and fell forward. Pt denies any LOC. Pt hit a flower vase on the shelf of the window. Pt is on eliquis. Pt c/o bilateral shoulder pain, nose pain, and right lower leg pain. Pt has a hematoma and a skin tear to the right leg.

## 2021-07-08 NOTE — Discharge Instructions (Signed)
Take Tylenol as needed for pain. Please return to the emergency department if you have any worsening pain, headache, vomiting, or any other concerning symptoms. Please follow-up with your primary care doctor in the next week.

## 2021-11-17 ENCOUNTER — Other Ambulatory Visit: Payer: Self-pay

## 2021-11-17 ENCOUNTER — Emergency Department (HOSPITAL_BASED_OUTPATIENT_CLINIC_OR_DEPARTMENT_OTHER): Payer: Medicare HMO

## 2021-11-17 ENCOUNTER — Emergency Department (HOSPITAL_BASED_OUTPATIENT_CLINIC_OR_DEPARTMENT_OTHER)
Admission: EM | Admit: 2021-11-17 | Discharge: 2021-11-17 | Disposition: A | Discharge status: Home / Self Care | Payer: Medicare HMO | Attending: Emergency Medicine | Admitting: Emergency Medicine

## 2021-11-17 ENCOUNTER — Encounter (HOSPITAL_BASED_OUTPATIENT_CLINIC_OR_DEPARTMENT_OTHER): Payer: Self-pay

## 2021-11-17 DIAGNOSIS — Z79899 Other long term (current) drug therapy: Secondary | ICD-10-CM | POA: Diagnosis not present

## 2021-11-17 DIAGNOSIS — R1013 Epigastric pain: Secondary | ICD-10-CM | POA: Diagnosis present

## 2021-11-17 DIAGNOSIS — Z7984 Long term (current) use of oral hypoglycemic drugs: Secondary | ICD-10-CM | POA: Insufficient documentation

## 2021-11-17 DIAGNOSIS — E039 Hypothyroidism, unspecified: Secondary | ICD-10-CM | POA: Insufficient documentation

## 2021-11-17 DIAGNOSIS — R17 Unspecified jaundice: Secondary | ICD-10-CM | POA: Diagnosis not present

## 2021-11-17 DIAGNOSIS — Z7901 Long term (current) use of anticoagulants: Secondary | ICD-10-CM | POA: Diagnosis not present

## 2021-11-17 DIAGNOSIS — R1012 Left upper quadrant pain: Secondary | ICD-10-CM

## 2021-11-17 DIAGNOSIS — I10 Essential (primary) hypertension: Secondary | ICD-10-CM | POA: Diagnosis not present

## 2021-11-17 DIAGNOSIS — I2510 Atherosclerotic heart disease of native coronary artery without angina pectoris: Secondary | ICD-10-CM | POA: Insufficient documentation

## 2021-11-17 DIAGNOSIS — D72829 Elevated white blood cell count, unspecified: Secondary | ICD-10-CM | POA: Insufficient documentation

## 2021-11-17 LAB — CBC
HCT: 39.7 % (ref 36.0–46.0)
Hemoglobin: 13.3 g/dL (ref 12.0–15.0)
MCH: 34.7 pg — ABNORMAL HIGH (ref 26.0–34.0)
MCHC: 33.5 g/dL (ref 30.0–36.0)
MCV: 103.7 fL — ABNORMAL HIGH (ref 80.0–100.0)
Platelets: 143 10*3/uL — ABNORMAL LOW (ref 150–400)
RBC: 3.83 MIL/uL — ABNORMAL LOW (ref 3.87–5.11)
RDW: 12.3 % (ref 11.5–15.5)
WBC: 7.4 10*3/uL (ref 4.0–10.5)
nRBC: 0 % (ref 0.0–0.2)

## 2021-11-17 LAB — URINALYSIS, ROUTINE W REFLEX MICROSCOPIC
Bilirubin Urine: NEGATIVE
Glucose, UA: NEGATIVE mg/dL
Hgb urine dipstick: NEGATIVE
Ketones, ur: NEGATIVE mg/dL
Nitrite: NEGATIVE
Protein, ur: NEGATIVE mg/dL
Specific Gravity, Urine: 1.006 (ref 1.005–1.030)
pH: 6.5 (ref 5.0–8.0)

## 2021-11-17 LAB — COMPREHENSIVE METABOLIC PANEL
ALT: 9 U/L (ref 0–44)
AST: 14 U/L — ABNORMAL LOW (ref 15–41)
Albumin: 4 g/dL (ref 3.5–5.0)
Alkaline Phosphatase: 73 U/L (ref 38–126)
Anion gap: 11 (ref 5–15)
BUN: 11 mg/dL (ref 8–23)
CO2: 24 mmol/L (ref 22–32)
Calcium: 9.6 mg/dL (ref 8.9–10.3)
Chloride: 102 mmol/L (ref 98–111)
Creatinine, Ser: 0.75 mg/dL (ref 0.44–1.00)
GFR, Estimated: >60 mL/min (ref >60)
Glucose, Bld: 116 mg/dL — ABNORMAL HIGH (ref 70–99)
Potassium: 4.1 mmol/L (ref 3.5–5.1)
Sodium: 137 mmol/L (ref 135–145)
Total Bilirubin: 1.8 mg/dL — ABNORMAL HIGH (ref 0.3–1.2)
Total Protein: 7 g/dL (ref 6.5–8.1)

## 2021-11-17 LAB — LIPASE, BLOOD: Lipase: 17 U/L (ref 11–51)

## 2021-11-17 LAB — LACTIC ACID, PLASMA: Lactic Acid, Venous: 1.2 mmol/L (ref 0.5–1.9)

## 2021-11-17 MED ORDER — LACTATED RINGERS IV BOLUS
500.0000 mL | Freq: Once | INTRAVENOUS | Status: AC
  Administered 2021-11-17: 500 mL via INTRAVENOUS

## 2021-11-17 MED ORDER — ONDANSETRON HCL 4 MG/2ML IJ SOLN
4.0000 mg | INTRAMUSCULAR | Status: DC | PRN
  Filled 2021-11-17: qty 2

## 2021-11-17 MED ORDER — FENTANYL CITRATE PF 50 MCG/ML IJ SOSY
25.0000 ug | PREFILLED_SYRINGE | Freq: Once | INTRAMUSCULAR | Status: AC
  Administered 2021-11-17: 25 ug via INTRAVENOUS
  Filled 2021-11-17: qty 1

## 2021-11-17 MED ORDER — IOHEXOL 300 MG/ML  SOLN
100.0000 mL | Freq: Once | INTRAMUSCULAR | Status: AC | PRN
  Administered 2021-11-17: 80 mL via INTRAVENOUS

## 2021-11-17 NOTE — ED Triage Notes (Signed)
Patient here POV from Home.  Endorses Epigastric and LUQ ABD Pain that began approximately 3 Days ago.  No N/V/D. Possible Constipation but states Pain did not change afterwards.   NAD Noted during Triage. A&Ox4. GCS 15. BIB Wheelchair.

## 2021-11-17 NOTE — ED Notes (Signed)
Dc instructions reviewed with patient. Patient voiced understanding. Dc with belongings.  °

## 2021-11-17 NOTE — ED Provider Notes (Signed)
Care transferred to me.  CT abdomen images viewed/interpreted myself.  No diverticulitis or bowel obstruction.  Small pleural effusions.  She points to her left upper quadrant but primarily her left lower ribs as the source of her pain.  Is painful to move and push herself up out of the chair.  I think this is probably musculoskeletal.  She cannot do NSAIDs because of Eliquis but I have recommended Tylenol and supportive care.  Given return precautions but appears stable for discharge.  No urinary symptoms.  Labs are otherwise unremarkable.   Sherwood Gambler, MD 11/17/21 [phone]

## 2021-11-17 NOTE — Discharge Instructions (Signed)
If you develop worsening, continued, or recurrent abdominal pain, uncontrolled vomiting, fever, chest or back pain, or any other new/concerning symptoms then return to the ER for evaluation.  

## 2021-11-17 NOTE — ED Provider Notes (Signed)
[facility] EMERGENCY DEPT Provider Note   CSN: [id number] Arrival date & time: 11/17/21  1139     History  Chief Complaint  Patient presents with   Abdominal Pain    Charlotte Powell is a 88 y.o. female.   Abdominal Pain    88 year old female with past medical history significant for HTN, hypothyroidism, HLD, CAD, GERD, diverticulitis who presents to the emergency department with left-sided abdominal pain and epigastric abdominal pain.  The patient states that she had left upper quadrant and epigastric abdominal pain that started approximately 3 days ago.  She denies any radiation of the pain.  She denies any nausea or vomiting.  She denies any diarrhea.  She states that she is unsure if she is passing gas but did have a bowel movement this morning that was normal.  She denies any fevers or chills.  Home Medications Prior to Admission medications   Medication Sig Start Date End Date Taking? Authorizing Provider  acetaminophen (TYLENOL) 325 MG tablet Take 650 mg by mouth every 8 (eight) hours as needed for mild pain (for pain). Also has order for 500mg  tablets scheduled nightly    [provider]  acetaminophen (TYLENOL) 500 MG tablet Take 1,000 mg by mouth at bedtime. Also has order for 325mg  tablets as needed.    [provider]  apixaban (ELIQUIS) 2.5 MG TABS tablet Take 1 tablet (2.5 mg total) by mouth 2 (two) times daily. 11/30/20   Ghimire, , MD  Cholecalciferol 25 MCG (1000 UT) capsule Take 1,000 Units by mouth daily.    [provider]  diclofenac Sodium (VOLTAREN) 1 % GEL Apply 4 g topically every 12 (twelve) hours as needed (pain on the right side of the neck). 10/23/20   [provider]  diltiazem (CARDIZEM SR) 60 MG 12 hr capsule Take 60 mg by mouth 2 (two) times daily.    [provider]  docusate sodium (COLACE) 100 MG capsule Take 1 capsule (100 mg total) by mouth daily as needed for mild constipation.  Give 100mg  by mouth once every 2 days for constipation Patient taking differently: Take 100 mg by mouth every other day as needed for mild constipation. 02/12/21   12/23/20 Latif, DO  famotidine (PEPCID) 20 MG tablet Take 1 tablet (20 mg total) by mouth daily. 02/23/21   Sheikh, Omair Latif, DO  fluticasone (FLONASE) 50 MCG/ACT nasal spray Place 1 spray into both nostrils daily. 08/07/20   [provider]  folic acid (FOLVITE) 800 MCG tablet Take 800 mcg by mouth daily.    [provider]  gabapentin (NEURONTIN) 100 MG capsule Take 100 mg by mouth at bedtime.    [provider]  levothyroxine (SYNTHROID) 25 MCG tablet Take 25 mcg by mouth daily.    [provider]  lidocaine (LIDODERM) 5 % Place 1 patch onto the skin daily. Remove & Discard patch within 12 hours or as directed by MD 04/15/21   04/23/21, MD  melatonin 5 MG TABS Take 5 mg by mouth at bedtime.    [provider]  nitroGLYCERIN (NITROSTAT) 0.4 MG SL tablet Place 0.4 mg under the tongue every 5 (five) minutes as needed for chest pain. 08/07/20   [provider]  ondansetron (ZOFRAN) 4 MG tablet Take 1 tablet (4 mg total) by mouth every 6 (six) hours as needed for nausea. 02/12/21   Jonah Blue Latif, DO  pravastatin (PRAVACHOL) 80 MG tablet Take 80 mg by mouth at bedtime.  10/25/20   [provider]  saccharomyces boulardii (FLORASTOR) 250 MG capsule Take 1 capsule (250 mg total) by mouth 2 (two) times daily. 02/12/21   Raiford Noble Latif, DO      Allergies    Amoxicillin and Hydrocodone    Review of Systems   Review of Systems  Gastrointestinal:  Positive for abdominal pain.  All other systems reviewed and are negative.   Physical Exam Updated Vital Signs BP (!) 148/73   Pulse 80   Temp 98.4 F (36.9 C) (Oral)   Resp 19   Ht 5\' 3"  (1.6 m)   Wt 59 kg   SpO2 98%   BMI 23.04 kg/m  Physical Exam Vitals and nursing note reviewed.  Constitutional:       General: She is not in acute distress.    Appearance: She is well-developed.  HENT:     Head: Normocephalic and atraumatic.  Eyes:     Conjunctiva/sclera: Conjunctivae normal.  Cardiovascular:     Rate and Rhythm: Normal rate and regular rhythm.     Heart sounds: No murmur heard. Pulmonary:     Effort: Pulmonary effort is normal. No respiratory distress.     Breath sounds: Normal breath sounds.  Abdominal:     Palpations: Abdomen is soft.     Tenderness: There is abdominal tenderness in the epigastric area, left upper quadrant and left lower quadrant. There is no guarding or rebound.  Musculoskeletal:        General: No swelling.     Cervical back: Neck supple.  Skin:    General: Skin is warm and dry.     Capillary Refill: Capillary refill takes less than 2 seconds.  Neurological:     Mental Status: She is alert.  Psychiatric:        Mood and Affect: Mood normal.     ED Results / Procedures / Treatments   Labs (all labs ordered are listed, but only abnormal results are displayed) Labs Reviewed  COMPREHENSIVE METABOLIC PANEL - Abnormal; Notable for the following components:      Result Value   Glucose, Bld 116 (*)    AST 14 (*)    Total Bilirubin 1.8 (*)    All other components within normal limits  CBC - Abnormal; Notable for the following components:   RBC 3.83 (*)    MCV 103.7 (*)    MCH 34.7 (*)    Platelets 143 (*)    All other components within normal limits  URINALYSIS, ROUTINE W REFLEX MICROSCOPIC - Abnormal; Notable for the following components:   Leukocytes,Ua SMALL (*)    All other components within normal limits  LIPASE, BLOOD  LACTIC ACID, PLASMA  LACTIC ACID, PLASMA    EKG EKG Interpretation  Date/Time:  Sunday November 17 2021 12:04:22 EDT Ventricular Rate:  67 PR Interval:    QRS Duration: 72 QT Interval:  392 QTC Calculation: 414 R Axis:   81 Text Interpretation: Atrial fibrillation Low voltage QRS Septal infarct , age undetermined Abnormal  ECG When compared with ECG of 14-Apr-2021 15:05, PREVIOUS ECG IS PRESENT Confirmed by Regan Lemming ([id number]) on 11/17/2021 12:35:57 PM  Radiology No results found.  Procedures Procedures    Medications Ordered in ED Medications  ondansetron (ZOFRAN) injection 4 mg (has no administration in time range)  lactated ringers bolus 500 mL (500 mLs Intravenous New Bag/Given 11/17/21 1503)  fentaNYL (SUBLIMAZE) injection 25 mcg (25 mcg Intravenous Given 11/17/21 1500)    ED Course/ Medical  Decision Making/ A&P                           Medical Decision Making Amount and/or Complexity of Data Reviewed Labs: ordered. Radiology: ordered.  Risk Prescription drug management.     88 year old female with past medical history significant for HTN, hypothyroidism, HLD, CAD, GERD, diverticulitis who presents to the emergency department with left-sided abdominal pain and epigastric abdominal pain.  The patient states that she had left upper quadrant and epigastric abdominal pain that started approximately 3 days ago.  She denies any radiation of the pain.  She denies any nausea or vomiting.  She denies any diarrhea.  She states that she is unsure if she is passing gas but did have a bowel movement this morning that was normal.  She denies any fevers or chills.   Vitals and telemetry on arrival: Afebrile, not tachycardic or tachypneic, BP 142/80, saturating 95% on room air.  Rate controlled atrial fibrillation noted on cardiac telemetry.  Pertinent exam findings include: Epigastric, left upper quadrant and left lower quadrant tenderness to palpation, no rebound or guarding  Differential Diagnosis: Considered most likely diverticulitis, GERD, PUD, SBO, gastritis.    EKG: Rate controlled atrial fibrillation, ventricular rate 6 7, no acute ischemic changes noted.  Lab results include: Urinalysis with small leukocytes, 11-20 WBCs, negative for bacteria, lactic acid collected and pending, CMP generally  unremarkable with mildly elevated T. bili to 1.8, otherwise unremarkable, lipase normal, CBC without a leukocytosis or anemia.  Imaging results include: CT abdomen pelvis pending.  IV access was obtained and the patient was administered an IV fluid bolus in addition to IV fentanyl.  Plan to reassess the patient following CT imaging, disposition pending.  Signout given to Dr. Criss Alvine at [phone].   Final Clinical Impression(s) / ED Diagnoses Final diagnoses:  None    Rx / DC Orders ED Discharge Orders     None         Ernie Avena, MD 11/17/21 1514

## 2021-11-21 ENCOUNTER — Encounter: Payer: Self-pay | Admitting: Cardiology

## 2021-11-21 ENCOUNTER — Ambulatory Visit: Payer: Medicare HMO | Attending: Cardiology | Admitting: Cardiology

## 2021-11-21 VITALS — BP 126/64 | HR 62 | Ht 63.0 in | Wt 142.8 lb

## 2021-11-21 DIAGNOSIS — I2510 Atherosclerotic heart disease of native coronary artery without angina pectoris: Secondary | ICD-10-CM

## 2021-11-21 DIAGNOSIS — I10 Essential (primary) hypertension: Secondary | ICD-10-CM | POA: Diagnosis not present

## 2021-11-21 DIAGNOSIS — I4819 Other persistent atrial fibrillation: Secondary | ICD-10-CM | POA: Diagnosis not present

## 2021-11-21 NOTE — Progress Notes (Signed)
Cardiology Office Note:    Date:  11/22/2021   ID:  Charlotte Powell, DOB [birth year], MRN [id number]  PCP:  System, Provider Not In  Cardiologist:  Berniece Salines, DO  Electrophysiologist:  None   Referring MD: No ref. provider found   " I am establishing care"  History of Present Illness:    Charlotte Powell is a 88 y.o. female with a hx of coronary artery disease status post drug-eluting stent to the RCA back in 2004, hypertension, atrial fibrillation diagnosed in 2004 and the patient and her cardiologist had previously deemed permanent atrial fibrillation and had not pursue any rhythm control she is on Eliquis 2.5 mg twice daily as well as Cardizem 60 mg twice daily.  The patient still moved to [facility] and is now establishing cardiac care.  However prior to her visit she has had episodes visits to the emergency department which during this time she was in atrial fibrillation with rapid ventricular rate.  However most of the visits were noncardiac symptoms.  She was seen in our A-fib clinic on March 21, 2021 during that time she and her daughter noted that her rate control strategy is what they would like to pursue.   She is here in office with her daughter.  She offers no complaints at this time.  Past Medical History:  Diagnosis Date   CAD (coronary artery disease)    Diverticulitis    GERD (gastroesophageal reflux disease)    HLD (hyperlipidemia)    HTN (hypertension)    Hypothyroidism     Past Surgical History:  Procedure Laterality Date   ANKLE FRACTURE SURGERY     CHOLECYSTECTOMY     CORONARY STENT PLACEMENT     FEMUR FRACTURE SURGERY     WRIST FRACTURE SURGERY      Current Medications: Current Meds  Medication Sig   acetaminophen (TYLENOL) 325 MG tablet Take 650 mg by mouth every 8 (eight) hours as needed for mild pain (for pain). Also has order for 500mg  tablets scheduled nightly   acetaminophen (TYLENOL) 500 MG tablet Take 1,000 mg by mouth at bedtime. Also has  order for 325mg  tablets as needed.   apixaban (ELIQUIS) 2.5 MG TABS tablet Take 1 tablet (2.5 mg total) by mouth 2 (two) times daily.   Cholecalciferol 25 MCG (1000 UT) capsule Take 1,000 Units by mouth daily.   diltiazem (CARDIZEM SR) 60 MG 12 hr capsule Take 60 mg by mouth 2 (two) times daily.   fluticasone (FLONASE) 50 MCG/ACT nasal spray Place 1 spray into both nostrils daily.   folic acid (FOLVITE) Q000111Q MCG tablet Take 800 mcg by mouth daily.   gabapentin (NEURONTIN) 300 MG capsule Take 300 mg by mouth at bedtime.   levothyroxine (SYNTHROID) 25 MCG tablet Take 25 mcg by mouth daily.   lidocaine (LIDODERM) 5 % Place 1 patch onto the skin daily. Remove & Discard patch within 12 hours or as directed by MD   melatonin 5 MG TABS Take 5 mg by mouth at bedtime.   nitroGLYCERIN (NITROSTAT) 0.4 MG SL tablet Place 0.4 mg under the tongue every 5 (five) minutes as needed for chest pain.   ondansetron (ZOFRAN) 4 MG tablet Take 1 tablet (4 mg total) by mouth every 6 (six) hours as needed for nausea.   pravastatin (PRAVACHOL) 80 MG tablet Take 80 mg by mouth at bedtime.   saccharomyces boulardii (FLORASTOR) 250 MG capsule Take 1 capsule (250 mg total) by mouth 2 (two) times daily.  Allergies:   Amoxicillin and Hydrocodone   Social History   Socioeconomic History   Marital status: Widowed    Spouse name: Not on file   Number of children: Not on file   Years of education: Not on file   Highest education level: Not on file  Occupational History   Occupation: retired  Tobacco Use   Smoking status: Never   Smokeless tobacco: Never  Substance and Sexual Activity   Alcohol use: Never   Drug use: Never   Sexual activity: Not on file  Other Topics Concern   Not on file  Social History Narrative   Not on file   Social Determinants of Health   Financial Resource Strain: Not on file  Food Insecurity: Not on file  Transportation Needs: Not on file  Physical Activity: Not on file  Stress:  Not on file  Social Connections: Not on file     Family History: The patient's family history is negative for Stomach cancer.  ROS:   Review of Systems  Constitution: Negative for decreased appetite, fever and weight gain.  HENT: Negative for congestion, ear discharge, hoarse voice and sore throat.   Eyes: Negative for discharge, redness, vision loss in right eye and visual halos.  Cardiovascular: Negative for chest pain, dyspnea on exertion, leg swelling, orthopnea and palpitations.  Respiratory: Negative for cough, hemoptysis, shortness of breath and snoring.   Endocrine: Negative for heat intolerance and polyphagia.  Hematologic/Lymphatic: Negative for bleeding problem. Does not bruise/bleed easily.  Skin: Negative for flushing, nail changes, rash and suspicious lesions.  Musculoskeletal: Negative for arthritis, joint pain, muscle cramps, myalgias, neck pain and stiffness.  Gastrointestinal: Negative for abdominal pain, bowel incontinence, diarrhea and excessive appetite.  Genitourinary: Negative for decreased libido, genital sores and incomplete emptying.  Neurological: Negative for brief paralysis, focal weakness, headaches and loss of balance.  Psychiatric/Behavioral: Negative for altered mental status, depression and suicidal ideas.  Allergic/Immunologic: Negative for HIV exposure and persistent infections.    EKGs/Labs/Other Studies Reviewed:    The following studies were reviewed today:   EKG:  The ekg ordered today demonstrates atrial fibrillation with slow Ventricular rate heart rate 51 beats minute.    Echo 02/10/21 demonstrated   1. Left ventricular ejection fraction, by estimation, is 65 to 70%. The  left ventricle has normal function. The left ventricle has no regional  wall motion abnormalities. Left ventricular diastolic parameters are  indeterminate.   2. Right ventricular systolic function is normal. The right ventricular  size is normal. There is normal  pulmonary artery systolic pressure. The  estimated right ventricular systolic pressure is 55.7 mmHg.   3. Left atrial size was mildly dilated.   4. The mitral valve is abnormal. Moderate mitral valve regurgitation.   5. Tricuspid valve regurgitation is mild to moderate.   6. The aortic valve is tricuspid. Aortic valve regurgitation is not  visualized. Aortic valve sclerosis/calcification is present, without any  evidence of aortic stenosis.   7. The inferior vena cava is normal in size with <50% respiratory  variability, suggesting right atrial pressure of 8 mmHg.   8. Left pleural effusion present.    Epic records are reviewed at length today  Recent Labs: 11/27/2020: TSH 4.119 02/09/2021: B Natriuretic Peptide 356.9 02/12/2021: Magnesium 1.9 11/17/2021: ALT 9; BUN 11; Creatinine, Ser 0.75; Hemoglobin 13.3; Platelets 143; Potassium 4.1; Sodium 137  Recent Lipid Panel No results found for: "CHOL", "TRIG", "HDL", "CHOLHDL", "VLDL", "LDLCALC", "LDLDIRECT"  Physical Exam:  VS:  BP 126/64   Pulse 62   Ht 5\' 3"  (1.6 m)   Wt 142 lb 12.8 oz (64.8 kg)   SpO2 96%   BMI 25.30 kg/m     Wt Readings from Last 3 Encounters:  11/21/21 142 lb 12.8 oz (64.8 kg)  11/17/21 130 lb 1.1 oz (59 kg)  07/08/21 130 lb (59 kg)     GEN: Well nourished, well developed in no acute distress HEENT: Normal NECK: No JVD; No carotid bruits LYMPHATICS: No lymphadenopathy CARDIAC: S1S2 noted,RRR, no murmurs, rubs, gallops RESPIRATORY:  Clear to auscultation without rales, wheezing or rhonchi  ABDOMEN: Soft, non-tender, non-distended, +bowel sounds, no guarding. EXTREMITIES: No edema, No cyanosis, no clubbing MUSCULOSKELETAL:  No deformity  SKIN: Warm and dry NEUROLOGIC:  Alert and oriented x 3, non-focal PSYCHIATRIC:  Normal affect, good insight  ASSESSMENT:    1. Coronary artery disease involving native coronary artery of native heart without angina pectoris   2. Primary hypertension   3.  Persistent atrial fibrillation (HCC)     PLAN:    Permanent atrial fibrillation -we will continue with rate control strategy.  Continue patient on her anticoagulation with Eliquis.  She is currently in the donut hole.  We will get a give the patient some samples.  We also got a give her the assistance form  CAD-no anginal symptoms  Hyperlipidemia - continue with current statin medication.   The patient is in agreement with the above plan. The patient left the office in stable condition.  The patient will follow up in 12 months or sooner if needed   Medication Adjustments/Labs and Tests Ordered: Current medicines are reviewed at length with the patient today.  Concerns regarding medicines are outlined above.  No orders of the defined types were placed in this encounter.  No orders of the defined types were placed in this encounter.   Patient Instructions  Medication Instructions:  Your physician recommends that you continue on your current medications as directed. Please refer to the Current Medication list given to you today.  *If you need a refill on your cardiac medications before your next appointment, please call your pharmacy*   Lab Work: NONE If you have labs (blood work) drawn today and your tests are completely normal, you will receive your results only by: [place] (if you have MyChart) OR A paper copy in the mail If you have any lab test that is abnormal or we need to change your treatment, we will call you to review the results.   Testing/Procedures: NONE   Follow-Up: At [facility], you and your health needs are our priority.  As part of our continuing mission to provide you with exceptional heart care, we have created designated Provider Care Teams.  These Care Teams include your primary Cardiologist (physician) and Advanced Practice Providers (APPs -  Physician Assistants and Nurse Practitioners) who all work together to provide you with the  care you need, when you need it.  We recommend signing up for the patient portal called "MyChart".  Sign up information is provided on this After Visit Summary.  MyChart is used to connect with patients for Virtual Visits (Telemedicine).  Patients are able to view lab/test results, encounter notes, upcoming appointments, etc.  Non-urgent messages can be sent to your provider as well.   To learn more about what you can do with MyChart, go to [email or web address].    Your next appointment:   9 month(s)  The format for  your next appointment:   In Person  Provider:   Berniece Salines, DO      Adopting a Healthy Lifestyle.  Know what a healthy weight is for you (roughly BMI <25) and aim to maintain this   Aim for 7+ servings of fruits and vegetables daily   65-80+ fluid ounces of water or unsweet tea for healthy kidneys   Limit to max 1 drink of alcohol per day; avoid smoking/tobacco   Limit animal fats in diet for cholesterol and heart health - choose grass fed whenever available   Avoid highly processed foods, and foods high in saturated/trans fats   Aim for low stress - take time to unwind and care for your mental health   Aim for 150 min of moderate intensity exercise weekly for heart health, and weights twice weekly for bone health   Aim for 7-9 hours of sleep daily   When it comes to diets, agreement about the perfect plan isnt easy to find, even among the experts. Experts at the [place] developed an idea known as the Healthy Eating Plate. Just imagine a plate divided into logical, healthy portions.   The emphasis is on diet quality:   Load up on vegetables and fruits - one-half of your plate: Aim for color and variety, and remember that potatoes dont count.   Go for whole grains - one-quarter of your plate: Whole wheat, barley, wheat berries, quinoa, oats, brown rice, and foods made with them. If you want pasta, go with whole wheat pasta.   Protein  power - one-quarter of your plate: Fish, chicken, beans, and nuts are all healthy, versatile protein sources. Limit red meat.   The diet, however, does go beyond the plate, offering a few other suggestions.   Use healthy plant oils, such as olive, canola, soy, corn, sunflower and peanut. Check the labels, and avoid partially hydrogenated oil, which have unhealthy trans fats.   If youre thirsty, drink water. Coffee and tea are good in moderation, but skip sugary drinks and limit milk and dairy products to one or two daily servings.   The type of carbohydrate in the diet is more important than the amount. Some sources of carbohydrates, such as vegetables, fruits, whole grains, and beans-are healthier than others.   Finally, stay active  Signed, Berniece Salines, DO  11/22/2021 9:28 PM    [place]

## 2021-11-21 NOTE — Patient Instructions (Signed)
Medication Instructions:  Your physician recommends that you continue on your current medications as directed. Please refer to the Current Medication list given to you today.  *If you need a refill on your cardiac medications before your next appointment, please call your pharmacy*   Lab Work: NONE If you have labs (blood work) drawn today and your tests are completely normal, you will receive your results only by: MyChart Message (if you have MyChart) OR A paper copy in the mail If you have any lab test that is abnormal or we need to change your treatment, we will call you to review the results.   Testing/Procedures: NONE   Follow-Up: At [facility], you and your health needs are our priority.  As part of our continuing mission to provide you with exceptional heart care, we have created designated Provider Care Teams.  These Care Teams include your primary Cardiologist (physician) and Advanced Practice Providers (APPs -  Physician Assistants and Nurse Practitioners) who all work together to provide you with the care you need, when you need it.  We recommend signing up for the patient portal called "MyChart".  Sign up information is provided on this After Visit Summary.  MyChart is used to connect with patients for Virtual Visits (Telemedicine).  Patients are able to view lab/test results, encounter notes, upcoming appointments, etc.  Non-urgent messages can be sent to your provider as well.   To learn more about what you can do with MyChart, go to [email or web address].    Your next appointment:   9 month(s)  The format for your next appointment:   In Person  Provider:   Kardie Tobb, DO   

## 2021-11-27 ENCOUNTER — Other Ambulatory Visit: Payer: Self-pay

## 2021-11-27 ENCOUNTER — Inpatient Hospital Stay (HOSPITAL_COMMUNITY)
Admission: EM | Admit: 2021-11-27 | Discharge: 2021-12-03 | DRG: 092 | Disposition: A | Discharge status: Home Health | Payer: Medicare HMO | Source: Skilled Nursing Facility | Attending: Internal Medicine | Admitting: Internal Medicine

## 2021-11-27 ENCOUNTER — Emergency Department (HOSPITAL_COMMUNITY): Payer: Medicare HMO

## 2021-11-27 ENCOUNTER — Encounter (HOSPITAL_COMMUNITY): Payer: Self-pay

## 2021-11-27 DIAGNOSIS — I2510 Atherosclerotic heart disease of native coronary artery without angina pectoris: Secondary | ICD-10-CM | POA: Diagnosis present

## 2021-11-27 DIAGNOSIS — R401 Stupor: Secondary | ICD-10-CM | POA: Diagnosis present

## 2021-11-27 DIAGNOSIS — J9811 Atelectasis: Secondary | ICD-10-CM | POA: Diagnosis present

## 2021-11-27 DIAGNOSIS — Z7901 Long term (current) use of anticoagulants: Secondary | ICD-10-CM

## 2021-11-27 DIAGNOSIS — G929 Unspecified toxic encephalopathy: Secondary | ICD-10-CM | POA: Diagnosis present

## 2021-11-27 DIAGNOSIS — Z79899 Other long term (current) drug therapy: Secondary | ICD-10-CM

## 2021-11-27 DIAGNOSIS — G928 Other toxic encephalopathy: Secondary | ICD-10-CM | POA: Diagnosis not present

## 2021-11-27 DIAGNOSIS — T50905A Adverse effect of unspecified drugs, medicaments and biological substances, initial encounter: Secondary | ICD-10-CM | POA: Diagnosis present

## 2021-11-27 DIAGNOSIS — E039 Hypothyroidism, unspecified: Secondary | ICD-10-CM | POA: Diagnosis present

## 2021-11-27 DIAGNOSIS — E876 Hypokalemia: Secondary | ICD-10-CM | POA: Diagnosis present

## 2021-11-27 DIAGNOSIS — I10 Essential (primary) hypertension: Secondary | ICD-10-CM | POA: Diagnosis present

## 2021-11-27 DIAGNOSIS — G629 Polyneuropathy, unspecified: Secondary | ICD-10-CM | POA: Diagnosis present

## 2021-11-27 DIAGNOSIS — M545 Low back pain, unspecified: Secondary | ICD-10-CM

## 2021-11-27 DIAGNOSIS — R001 Bradycardia, unspecified: Secondary | ICD-10-CM | POA: Diagnosis not present

## 2021-11-27 DIAGNOSIS — Z66 Do not resuscitate: Secondary | ICD-10-CM | POA: Diagnosis present

## 2021-11-27 DIAGNOSIS — R4182 Altered mental status, unspecified: Principal | ICD-10-CM

## 2021-11-27 DIAGNOSIS — Z79890 Hormone replacement therapy (postmenopausal): Secondary | ICD-10-CM

## 2021-11-27 DIAGNOSIS — I4819 Other persistent atrial fibrillation: Secondary | ICD-10-CM | POA: Diagnosis present

## 2021-11-27 DIAGNOSIS — R29810 Facial weakness: Secondary | ICD-10-CM | POA: Diagnosis present

## 2021-11-27 DIAGNOSIS — G8929 Other chronic pain: Secondary | ICD-10-CM | POA: Diagnosis present

## 2021-11-27 DIAGNOSIS — Z885 Allergy status to narcotic agent status: Secondary | ICD-10-CM

## 2021-11-27 DIAGNOSIS — K5730 Diverticulosis of large intestine without perforation or abscess without bleeding: Secondary | ICD-10-CM | POA: Diagnosis present

## 2021-11-27 DIAGNOSIS — K219 Gastro-esophageal reflux disease without esophagitis: Secondary | ICD-10-CM | POA: Diagnosis present

## 2021-11-27 DIAGNOSIS — M8008XA Age-related osteoporosis with current pathological fracture, vertebra(e), initial encounter for fracture: Secondary | ICD-10-CM | POA: Diagnosis present

## 2021-11-27 DIAGNOSIS — Z881 Allergy status to other antibiotic agents status: Secondary | ICD-10-CM

## 2021-11-27 DIAGNOSIS — E785 Hyperlipidemia, unspecified: Secondary | ICD-10-CM | POA: Diagnosis present

## 2021-11-27 DIAGNOSIS — M25512 Pain in left shoulder: Secondary | ICD-10-CM | POA: Diagnosis present

## 2021-11-27 DIAGNOSIS — T428X5A Adverse effect of antiparkinsonism drugs and other central muscle-tone depressants, initial encounter: Secondary | ICD-10-CM | POA: Diagnosis present

## 2021-11-27 DIAGNOSIS — Z955 Presence of coronary angioplasty implant and graft: Secondary | ICD-10-CM

## 2021-11-27 DIAGNOSIS — D7589 Other specified diseases of blood and blood-forming organs: Secondary | ICD-10-CM | POA: Diagnosis present

## 2021-11-27 LAB — URINALYSIS, ROUTINE W REFLEX MICROSCOPIC
Bacteria, UA: NONE SEEN
Bilirubin Urine: NEGATIVE
Glucose, UA: NEGATIVE mg/dL
Ketones, ur: NEGATIVE mg/dL
Leukocytes,Ua: NEGATIVE
Nitrite: NEGATIVE
Protein, ur: NEGATIVE mg/dL
Specific Gravity, Urine: 1.013 (ref 1.005–1.030)
pH: 7 (ref 5.0–8.0)

## 2021-11-27 LAB — DIFFERENTIAL
Abs Immature Granulocytes: 0.03 10*3/uL (ref 0.00–0.07)
Basophils Absolute: 0 10*3/uL (ref 0.0–0.1)
Basophils Relative: 0 %
Eosinophils Absolute: 0 10*3/uL (ref 0.0–0.5)
Eosinophils Relative: 1 %
Immature Granulocytes: 0 %
Lymphocytes Relative: 29 %
Lymphs Abs: 2 10*3/uL (ref 0.7–4.0)
Monocytes Absolute: 0.4 10*3/uL (ref 0.1–1.0)
Monocytes Relative: 6 %
Neutro Abs: 4.4 10*3/uL (ref 1.7–7.7)
Neutrophils Relative %: 64 %

## 2021-11-27 LAB — COMPREHENSIVE METABOLIC PANEL
ALT: 12 U/L (ref 0–44)
AST: 18 U/L (ref 15–41)
Albumin: 3.8 g/dL (ref 3.5–5.0)
Alkaline Phosphatase: 88 U/L (ref 38–126)
Anion gap: 8 (ref 5–15)
BUN: 7 mg/dL — ABNORMAL LOW (ref 8–23)
CO2: 24 mmol/L (ref 22–32)
Calcium: 9.8 mg/dL (ref 8.9–10.3)
Chloride: 110 mmol/L (ref 98–111)
Creatinine, Ser: 0.78 mg/dL (ref 0.44–1.00)
GFR, Estimated: >60 mL/min (ref >60)
Glucose, Bld: 161 mg/dL — ABNORMAL HIGH (ref 70–99)
Potassium: 3.5 mmol/L (ref 3.5–5.1)
Sodium: 142 mmol/L (ref 135–145)
Total Bilirubin: 1.4 mg/dL — ABNORMAL HIGH (ref 0.3–1.2)
Total Protein: 7.2 g/dL (ref 6.5–8.1)

## 2021-11-27 LAB — RAPID URINE DRUG SCREEN, HOSP PERFORMED
Amphetamines: NOT DETECTED
Barbiturates: NOT DETECTED
Benzodiazepines: NOT DETECTED
Cocaine: NOT DETECTED
Opiates: NOT DETECTED
Tetrahydrocannabinol: NOT DETECTED

## 2021-11-27 LAB — CBC
HCT: 43.9 % (ref 36.0–46.0)
Hemoglobin: 15 g/dL (ref 12.0–15.0)
MCH: 35.4 pg — ABNORMAL HIGH (ref 26.0–34.0)
MCHC: 34.2 g/dL (ref 30.0–36.0)
MCV: 103.5 fL — ABNORMAL HIGH (ref 80.0–100.0)
Platelets: 177 10*3/uL (ref 150–400)
RBC: 4.24 MIL/uL (ref 3.87–5.11)
RDW: 12.5 % (ref 11.5–15.5)
WBC: 6.9 10*3/uL (ref 4.0–10.5)
nRBC: 0 % (ref 0.0–0.2)

## 2021-11-27 LAB — I-STAT CHEM 8, ED
BUN: 8 mg/dL (ref 8–23)
Calcium, Ion: 1.19 mmol/L (ref 1.15–1.40)
Chloride: 106 mmol/L (ref 98–111)
Creatinine, Ser: 0.6 mg/dL (ref 0.44–1.00)
Glucose, Bld: 164 mg/dL — ABNORMAL HIGH (ref 70–99)
HCT: 46 % (ref 36.0–46.0)
Hemoglobin: 15.6 g/dL — ABNORMAL HIGH (ref 12.0–15.0)
Potassium: 3.5 mmol/L (ref 3.5–5.1)
Sodium: 142 mmol/L (ref 135–145)
TCO2: 25 mmol/L (ref 22–32)

## 2021-11-27 LAB — ETHANOL: Alcohol, Ethyl (B): <10 mg/dL (ref <10)

## 2021-11-27 LAB — CBG MONITORING, ED: Glucose-Capillary: 149 mg/dL — ABNORMAL HIGH (ref 70–99)

## 2021-11-27 LAB — APTT: aPTT: 30 seconds (ref 24–36)

## 2021-11-27 LAB — PROTIME-INR
INR: 1.1 (ref 0.8–1.2)
Prothrombin Time: 13.6 seconds (ref 11.4–15.2)

## 2021-11-27 LAB — TSH: TSH: 9.842 u[IU]/mL — ABNORMAL HIGH (ref 0.350–4.500)

## 2021-11-27 MED ORDER — HYDROMORPHONE HCL 1 MG/ML IJ SOLN
0.5000 mg | Freq: Once | INTRAMUSCULAR | Status: AC
  Administered 2021-11-28: 0.5 mg via INTRAVENOUS
  Filled 2021-11-27: qty 1

## 2021-11-27 MED ORDER — IOHEXOL 350 MG/ML SOLN
75.0000 mL | Freq: Once | INTRAVENOUS | Status: AC | PRN
  Administered 2021-11-27: 75 mL via INTRAVENOUS

## 2021-11-27 MED ORDER — ENOXAPARIN SODIUM 40 MG/0.4ML IJ SOSY
40.0000 mg | PREFILLED_SYRINGE | INTRAMUSCULAR | Status: DC
  Administered 2021-11-28: 40 mg via SUBCUTANEOUS
  Filled 2021-11-27: qty 0.4

## 2021-11-27 MED ORDER — SODIUM CHLORIDE 0.9 % IV BOLUS
500.0000 mL | Freq: Once | INTRAVENOUS | Status: AC
  Administered 2021-11-27: 500 mL via INTRAVENOUS

## 2021-11-27 MED ORDER — LEVOTHYROXINE SODIUM 100 MCG/5ML IV SOLN
18.7500 ug | Freq: Every day | INTRAVENOUS | Status: DC
  Administered 2021-11-28: 18.75 ug via INTRAVENOUS
  Filled 2021-11-27: qty 5

## 2021-11-27 MED ORDER — LACTATED RINGERS IV SOLN: INTRAVENOUS | Status: AC

## 2021-11-27 MED ORDER — LIDOCAINE 5 % EX PTCH
1.0000 | MEDICATED_PATCH | CUTANEOUS | Status: DC
  Administered 2021-11-27 – 2021-11-28 (×2): 1 via TRANSDERMAL
  Filled 2021-11-27 (×5): qty 1

## 2021-11-27 NOTE — ED Triage Notes (Addendum)
BIB by [facility]  staff woke her  up and was altered usually gets her self up and dress and walks around.  This am they couldn't get her aroused or able to take her meds.  Right pupil is dilated unsure if normal.  Patient is on eliquis.  Unknown time she went to sleep last night and went in around 730 and found her this way.  BP 180/100 HR 80 92 Ra placed on 3L [place] 98%  CBg 146 patient is drooling.  Patient withdraws from pain but is unable to follow commands.  EMS reports she vomited at some point and now has gurgling sounds.

## 2021-11-27 NOTE — ED Provider Triage Note (Signed)
Emergency Medicine Provider Triage Evaluation Note  Charlotte Powell , a 88 y.o. female  was evaluated in triage.  Patient presenting from SNF due to altered status.  She is usually ambulatory and alert and was so yesterday but this morning she was found.  Altered.  She was not responding to the staff and sent her here.  EMS reported that she threw up in route and may have aspirated. Review of Systems  Positive: Altered Negative:   Physical Exam  BP (!) 160/114 (BP Location: Right Arm)   Pulse 66   Resp (!) 24   Ht 5\' 3"  (1.6 m)   Wt 64.4 kg   SpO2 93%   BMI 25.15 kg/m  Gen:   Awake, no distress   Resp:  Normal effort  MSK:   Moves extremities without difficulty  Other:  Patient not following commands.  Fixed and minimally reactive left eye.  She also pain in all extremities.  Apparent left-sided facial droop with drooling.  Medical Decision Making  Medically screening exam initiated at 9:17 AM.  Appropriate orders placed.  Jinna Weinman was informed that the remainder of the evaluation will be completed by another provider, this initial triage assessment does not replace that evaluation, and the importance of remaining in the ED until their evaluation is complete.  Per chart review patient is on Eliquis for A-fib   Rhae Hammock, PA-C 11/27/21 7001

## 2021-11-27 NOTE — ED Notes (Signed)
This RN spoke with admitting provider in regards to patient's daughter's concerns of patient not being ordered anything for pain management. Admitting provider will come to bedside to evaluate patient and also speak with daughter and update her on plan of care. This RN went to bedside to update daughter of same and evaluate patient who is resting with eyes closed and respirations even and unlabored, no signs of acute distress.

## 2021-11-27 NOTE — Hospital Course (Addendum)
Mr. Charlotte Powell is a 88 year old female with a past medical history significant for CAD s/p PCI w/ DES to RCA in 2004, hypertension, persistent persistent A-fib on cardizem and eliquis, hypothyroidism on levothyroxine, GERD, and neuropathy on gabapentin.    Patient is confused and history is provided by her daughter who is at bedside.  Patient started having unprovoked abdominal pain in the left upper quadrant starting in September.  This episode eventually resolved.  She had no associated nausea, vomiting, diarrhea, hematemesis, hematochezia, or melena.  Patient also had no difficulty eating or drinking.  Per patient's daughter, she does not feel the pain is associated with eating or drinking either. Also had no acute trauma that her daughter is aware of, though she does note that May 2023 patient did have a mechanical fall and had R shin and pain over her nose. Imaging of the head, c-spine, and pelvis were negative for acute pathology. L shoulder and L humerus x rays were also negative.    Patient began having left upper quadrant and epigastric pain again around late September and early October.  She presented to the ED for this.  At that time she had no nausea, vomiting, diarrhea, or difficulty eating.  She also denied fevers or chills.  A CT abdomen pelvis was obtained which showed no evidence of bowel pathology.  It was thought that her symptoms were musculoskeletal in nature and she was discharged home.  Since that time, patient has continued to have persistent abdominal pain in the left upper quadrant and epigastrium.  Her daughter states that she continues to not have any associated nausea, vomiting, diarrhea, constipation, hematemesis, melena, hematochezia, difficulty eating or swallowing, or acute trauma. Patient's daughter did note that the pain is exacerbated with turning to the side.   Patient was evaluated by a nurse practitioner at her facility who recommended that she start baclofen for  her abdominal pain as it was thought that her abdominal pain could be related to compression fractures that were seen on the CT abdomen pelvis obtained 06/2021.  It is unclear how many doses of baclofen patient received however on the morning of admission she was difficult to arouse.  She was transported to the emergency department for further evaluation.  IM TS was consulted for admission due to her encephalopathy.  Medications:  Eliquis 2.5mg  BID  Cardizem SR 60mg  q12h Gabapentin 300mg  qHS Synthroid 25 mcg daily Lidocaine patches to the left shoulder Miralx  Omeprazole 40mg  qd  Simethicone 125mg  QID.   PMH:  Hypothyroidism  Persistent Afib  Neuropathy of unclear etiology CAD s/p PCI w/ DES to RCA in 2004 HTN HLD   SH:  Lives in assisted living facility after her falls. She is able to move independently with a walker. She is able to feed herself without difficulty. She walks to the dining area of her facility independently and eats. She is able to toilet independently at baseline. She is also able to do small sponge baths but depends on aides to give her a shower once a week.   She used to be a [employer] cabin crew.   She has one adult daughter.   She has never smoked and does not currently drink alcohol.     DNR

## 2021-11-27 NOTE — ED Notes (Signed)
Patient's daughter came out of room to speak with this RN and asked if admitting provider had ordered pain medication for patient. This RN verbalized to daughter that provider still has not ordered anything but this RN will reach out again. Patient's daughter then verbalized to this RN her frustration of her mother not having anything for pain and continues to seem as though she is in distress. Patient's daughter would also like to speak with the charge nurse at this time. Charge nurse will be notified of same and will request to come to bedside to speak with daughter.

## 2021-11-27 NOTE — H&P (Addendum)
Date: 11/27/2021               Patient Name:  Charlotte Powell MRN: [id number]  DOB: 03-22-1928 Age / Sex: 88 y.o., female   PCP: Edmonia Caprio, NP         Medical Service: Internal Medicine Teaching Service         Attending Physician: Dr. Jimmye Norman, Elaina Pattee, MD    First Contact: Dr. Linward Natal  Pager: [phone]  Second Contact: Dr. Rick Duff  Pager: [phone]       After Hours (After 5p/  First Contact Pager: [phone]  weekends / holidays): Second Contact Pager: [phone]   Chief Complaint: Encephalopathy   History of Present Illness:   [name] is a 88 year old female with a past medical history significant for CAD s/p PCI w/ DES to RCA in 2004, hypertension, persistent persistent A-fib on cardizem and eliquis, hypothyroidism on levothyroxine, GERD, and neuropathy on gabapentin.    Patient is confused and history is provided by her daughter who is at bedside.  Patient started having unprovoked abdominal pain in the left upper quadrant starting in September.  This episode eventually resolved.  She had no associated nausea, vomiting, diarrhea, hematemesis, hematochezia, or melena.  Patient also had no difficulty eating or drinking.  Per patient's daughter, she does not feel the pain is associated with eating or drinking either. Also had no acute trauma that her daughter is aware of, though she does note that May 2023 patient did have a mechanical fall and had R shin and pain over her nose. Imaging of the head, c-spine, and pelvis were negative for acute pathology. L shoulder and L humerus x rays were also negative.    Patient began having left upper quadrant and epigastric pain again around late September and early October.  She presented to the ED for this.  At that time she had no nausea, vomiting, diarrhea, or difficulty eating.  She also denied fevers or chills.  A CT abdomen pelvis was obtained which showed no evidence of bowel pathology.  It was  thought that her symptoms were musculoskeletal in nature and she was discharged home.  Since that time, patient has continued to have persistent abdominal pain in the left upper quadrant and epigastrium.  Her daughter states that she continues to not have any associated nausea, vomiting, diarrhea, constipation, hematemesis, melena, hematochezia, difficulty eating or swallowing, or acute trauma. Patient's daughter did note that the pain is exacerbated with turning to the side.   Patient was evaluated by a nurse practitioner at her facility who recommended that she start baclofen for her abdominal pain as it was thought that her abdominal pain could be related to compression fractures that were seen on the CT abdomen pelvis obtained 06/2021.  It is unclear how many doses of baclofen patient received however on the morning of admission she was difficult to arouse.  She was transported to the emergency department for further evaluation.  IM TS was consulted for admission due to her encephalopathy.  Medications:  Eliquis 2.5mg  BID  Cardizem SR 60mg  q12h Gabapentin 300mg  qHS Synthroid 25 mcg daily Lidocaine patches to the left shoulder Miralx  Omeprazole 40mg  qd  Simethicone 125mg  QID.   PMH:  Hypothyroidism  Persistent Afib  Neuropathy of unclear etiology CAD s/p PCI w/ DES to RCA in 2004 HTN HLD   SH:  Lives in assisted living facility after her falls. She is able to move independently with a  walker. She is able to feed herself without difficulty. She walks to the dining area of her facility independently and eats. She is able to toilet independently at baseline. She is also able to do small sponge baths but depends on aides to give her a shower once a week.   She used to be a [employer] cabin crew.   She has one adult daughter.   She has never smoked and does not currently drink alcohol.     DNR  Meds:  Current Meds  Medication Sig   acetaminophen (TYLENOL) 325 MG tablet Take 650 mg by  mouth every 8 (eight) hours as needed for moderate pain.   acetaminophen (TYLENOL) 500 MG tablet Take 1,000 mg by mouth 2 (two) times daily.   apixaban (ELIQUIS) 2.5 MG TABS tablet Take 1 tablet (2.5 mg total) by mouth 2 (two) times daily.   Cholecalciferol 25 MCG (1000 UT) capsule Take 1,000 Units by mouth daily.   diltiazem (CARDIZEM SR) 60 MG 12 hr capsule Take 60 mg by mouth 2 (two) times daily.   fluticasone (FLONASE) 50 MCG/ACT nasal spray Place 1 spray into both nostrils daily.   folic acid (FOLVITE) Q000111Q MCG tablet Take 800 mcg by mouth daily.   gabapentin (NEURONTIN) 300 MG capsule Take 300 mg by mouth at bedtime.   levothyroxine (SYNTHROID) 25 MCG tablet Take 25 mcg by mouth daily.   lidocaine (LIDODERM) 5 % Place 1 patch onto the skin daily. Remove & Discard patch within 12 hours or as directed by MD (Patient taking differently: Place 1 patch onto the skin daily. To left shoulder.)   lidocaine (XYLOCAINE) 5 % ointment Apply 1 Application topically 2 (two) times daily. To lower back   melatonin 5 MG TABS Take 5 mg by mouth at bedtime.   nitroGLYCERIN (NITROSTAT) 0.4 MG SL tablet Place 0.4 mg under the tongue every 5 (five) minutes as needed for chest pain.   omeprazole (PRILOSEC) 40 MG capsule Take 20 mg by mouth daily.   Polyethylene Glycol 3350 POWD Take 1 Capful by mouth daily as needed (constipation).   pravastatin (PRAVACHOL) 80 MG tablet Take 80 mg by mouth at bedtime.   Psyllium (REGULOID) 400 MG CAPS Take 400 mg by mouth 2 (two) times daily.   saccharomyces boulardii (FLORASTOR) 250 MG capsule Take 1 capsule (250 mg total) by mouth 2 (two) times daily.   simethicone (MYLICON) 0000000 MG chewable tablet Chew 125 mg by mouth 4 (four) times daily as needed for flatulence.     Allergies: Allergies as of 11/27/2021 - Review Complete 11/27/2021  Allergen Reaction Noted   Amoxicillin Other (See Comments) 11/16/2020   Hydrocodone  11/16/2020   Past Medical History:  Diagnosis Date    CAD (coronary artery disease)    Diverticulitis    GERD (gastroesophageal reflux disease)    HLD (hyperlipidemia)    HTN (hypertension)    Hypothyroidism     Family History:  Family History  Problem Relation Age of Onset   Stomach cancer Neg Hx      Social History:  Social History   Socioeconomic History   Marital status: Widowed    Spouse name: Not on file   Number of children: Not on file   Years of education: Not on file   Highest education level: Not on file  Occupational History   Occupation: retired  Tobacco Use   Smoking status: Never   Smokeless tobacco: Never  Substance and Sexual Activity   Alcohol use: Never  Drug use: Never   Sexual activity: Not on file  Other Topics Concern   Not on file  Social History Narrative   Not on file   Social Determinants of Health   Financial Resource Strain: Not on file  Food Insecurity: Not on file  Transportation Needs: Not on file  Physical Activity: Not on file  Stress: Not on file  Social Connections: Not on file  Intimate Partner Violence: Not on file     Review of Systems: A complete ROS was negative except as per HPI.   Physical Exam: Blood pressure (!) 147/82, pulse 82, temperature (!) 97.2 F (36.2 C), temperature source Temporal, resp. rate (!) 24, height 5\' 3"  (1.6 m), weight 64.4 kg, SpO2 98 %.   Constitutional: Well-developed, well-nourished, and in no distress.  HENT:  Head: Normocephalic and atraumatic. L forehead well healed scar  Eyes: EOM are normal.  Neck: Normal range of motion.  Cardiovascular: Normal rate, irregular rhythm, intact distal pulses. No gallop and no friction rub.  No murmur heard. No lower extremity edema  Pulmonary: Non labored breathing on room air, no wheezing or rales  Abdominal: Soft. Normal bowel sounds. Non distended and non tender Musculoskeletal: Normal range of motion.        General: No tenderness or edema.  Neurological: Opens eyes spont Skin: Skin is  warm and dry.    EKG: personally reviewed my interpretation is atrial fibrillation not in RVR   CXR: personally reviewed my interpretation is small left pleural effusion.   CT head w/o contrast: mild background parenchymal volume loss with prominence of the ventricular system and extra-axial CSF spaces. Patchy hypodensity in the supratentorial white matter likely reflects sequela of underlying chronic small vessel ischemic change.  C-spine: trace anterolisthesis of C6 on C7 and C7 on T1, unchanged and likely degenerative in nature. multilevel disc space narrowing and degenerative endplate change, most advanced at C5-C6 and C6-C7. Is multilevel bulky facet arthropathy, most advanced at C3-C4 and C4-C5 on the right.  CT A/P   IMPRESSION: 1. No acute finding in the abdomen or pelvis or significant change since 11/17/2021. 2. Colonic diverticulosis without evidence of acute diverticulitis. 3. Unchanged small right larger than left pleural effusions with mild adjacent atelectasis. 4. Compression deformities of the T11 through L2 vertebral bodies are unchanged since the CT from 11/17/2021; however, the compression deformity of T11 is slightly progressed since 11/16/2020. Correlate with any symptoms, and consider lumbar spine MRI to evaluate for acute/subacute fracture if indicated.   Assessment & Plan by Problem: Principal Problem:   Toxic encephalopathy  [name] is a 87 year old female with a past medical history significant for CAD s/p PCI w/ DES to RCA in 2004, hypertension, persistent persistent A-fib on cardizem and eliquis, hypothyroidism on levothyroxine, GERD, and neuropathy on gabapentin who p/w acute toxic encephalopathy in the setting of recent initiation of baclofen for a month of chronic abdominal pain of unclear etiology.    #Toxic encephalopathy in the setting of recent initiation of baclofen  Patient was noted to have recently started baclofen (unclear  dose) for persistent abdominal pain of 1 month of unclear etiology. She was found to be unresponsive on the day of admission. She was given IV fluids and her mental status improved. She opened her eyes spontaneously and responded to her voice. She did not follow other commands. Patient is able to protect here airway and is not in any respiratory distress.  -Continue to monitor mental and respiratory status -  Hold baclofen, discontinue this at discharge as patient has not been on this chronically  -Continue IV fluids at LR 75cc/h -NPO while her mental status clears   #Abdominal pain of unclear etiology Per patient's daughter this has been persistent since the beginning of the month. She has not had any associated symptoms, however the pain is worsened with movement to the side. Patient has not had any recent trauma either. Patient has had a recent negative work up as well with CT A/P being negative for intraabdominal pathology on 10/01. Of note, however, she has compression fractures of T11-L2 which were noted to be chronic on CT A/P 10/01. However, on imaging this time the compression deformity at T11 was noted to be slightly progressed from imaging 11/16/2020. It could be that this is contributing to her symptoms.  -Will consider MRI of L spine to see if acute/subacute fracture -Lidocaine patches for pain and tylenol   #Persistent atrial fibrillation On eliquis and cardizem at home. Her chads vasc is 5, HAS Bled 2. She is currently rate controlled will hold home medications until her mental status improves.   #CAD s/p PCI w/ RCA  No chest pain. Hold her home medications. Continue to monitor.    #Hypothyroidism -Will start IV levothyroxine at 75% her home dose   #Macrocytosis without anemia CBC    Component Value Date/Time   WBC 5.8 11/28/2021 0442   RBC 3.89 11/28/2021 0442   HGB 13.8 11/28/2021 0442   HCT 40.2 11/28/2021 0442   PLT 188 11/28/2021 0442   MCV 103.3 (H) 11/28/2021 0442    MCH 35.5 (H) 11/28/2021 0442   MCHC 34.3 11/28/2021 0442   RDW 12.7 11/28/2021 0442   LYMPHSABS 2.0 11/27/2021 0925   MONOABS 0.4 11/27/2021 0925   EOSABS 0.0 11/27/2021 0925   BASOSABS 0.0 11/27/2021 0925   Will check folate and vitb12 levels.    Dispo: Admit patient to Observation with expected length of stay less than 2 midnights.  Signed: Rick Duff, MD 11/27/2021, 6:47 PM  After 5pm on weekdays and 1pm on weekends: On Call pager: [phone]

## 2021-11-27 NOTE — ED Notes (Signed)
Daughter came out of room asking this RN if patient could have something for pain. This RN let daughter know that patient did not have anything ordered but will reach out to provider at this time.

## 2021-11-27 NOTE — ED Notes (Signed)
Admitting provider at bedside at this time.

## 2021-11-27 NOTE — ED Provider Notes (Signed)
[facility] EMERGENCY DEPARTMENT Provider Note   CSN: [id number] Arrival date & time: 11/27/21  1962     History  Chief Complaint  Patient presents with   Altered Mental Status    Charlotte Powell is a 88 y.o. female.  Patient is a 88 yo female with pmh of CAD, GERD, hypertension, hyperlipidemia, and hypothyroidism presenting via ems from assisted nursing facility for AMS. Pt was reported to have been found minimally responsive this morning with some emesis in route. Was last reported normal yesterday. Daughter at bedside states patient's baseline is alert, oriented, able to feed and dress herself, able to ambulate with minimal assistance.  She also states her mother was started on Baclofen yesterday for acute on chronic back pain from previous thoracic and vertebral compression fractures found this year.  The history is provided by the patient. No language interpreter was used.  Altered Mental Status      Home Medications Prior to Admission medications   Medication Sig Start Date End Date Taking? Authorizing Provider  acetaminophen (TYLENOL) 325 MG tablet Take 650 mg by mouth every 8 (eight) hours as needed for mild pain (for pain). Also has order for 500mg  tablets scheduled nightly    [provider]  acetaminophen (TYLENOL) 500 MG tablet Take 1,000 mg by mouth at bedtime. Also has order for 325mg  tablets as needed.    [provider]  apixaban (ELIQUIS) 2.5 MG TABS tablet Take 1 tablet (2.5 mg total) by mouth 2 (two) times daily. 11/30/20   Ghimire, Henreitta Leber, MD  Cholecalciferol 25 MCG (1000 UT) capsule Take 1,000 Units by mouth daily.    [provider]  diclofenac Sodium (VOLTAREN) 1 % GEL Apply 4 g topically every 12 (twelve) hours as needed (pain on the right side of the neck). Patient not taking: Reported on 11/21/2021 10/23/20   [provider]  diltiazem (CARDIZEM SR) 60 MG 12 hr capsule Take 60 mg by mouth 2 (two) times  daily.    [provider]  docusate sodium (COLACE) 100 MG capsule Take 1 capsule (100 mg total) by mouth daily as needed for mild constipation. Give 100mg  by mouth once every 2 days for constipation Patient not taking: Reported on 11/21/2021 02/12/21   Raiford Noble Latif, DO  famotidine (PEPCID) 20 MG tablet Take 1 tablet (20 mg total) by mouth daily. Patient not taking: Reported on 11/21/2021 02/23/21   Raiford Noble Latif, DO  fluticasone [facility]) 50 MCG/ACT nasal spray Place 1 spray into both nostrils daily. 08/07/20   [provider]  folic acid (FOLVITE) 229 MCG tablet Take 800 mcg by mouth daily.    [provider]  gabapentin (NEURONTIN) 300 MG capsule Take 300 mg by mouth at bedtime.    [provider]  levothyroxine (SYNTHROID) 25 MCG tablet Take 25 mcg by mouth daily.    [provider]  lidocaine (LIDODERM) 5 % Place 1 patch onto the skin daily. Remove & Discard patch within 12 hours or as directed by MD 04/15/21   Karmen Bongo, MD  melatonin 5 MG TABS Take 5 mg by mouth at bedtime.    [provider]  nitroGLYCERIN (NITROSTAT) 0.4 MG SL tablet Place 0.4 mg under the tongue every 5 (five) minutes as needed for chest pain. 08/07/20   [provider]  omeprazole (PRILOSEC) 40 MG capsule Take 40 mg by mouth daily. 11/11/21   [provider]  ondansetron (ZOFRAN) 4 MG tablet Take 1 tablet (  4 mg total) by mouth every 6 (six) hours as needed for nausea. 02/12/21   Raiford Noble Latif, DO  pravastatin (PRAVACHOL) 80 MG tablet Take 80 mg by mouth at bedtime. 10/25/20   [provider]  saccharomyces boulardii (FLORASTOR) 250 MG capsule Take 1 capsule (250 mg total) by mouth 2 (two) times daily. 02/12/21   Raiford Noble Latif, DO      Allergies    Amoxicillin and Hydrocodone    Review of Systems   Review of Systems  Unable to perform ROS: Mental status change    Physical Exam Updated Vital Signs BP (!) 173/86    Pulse (!) 45   Resp (!) 22   Ht 5\' 3"  (1.6 m)   Wt 64.4 kg   SpO2 100%   BMI 25.15 kg/m  Physical Exam Vitals and nursing note reviewed.  Constitutional:      General: She is sleeping.  HENT:     Head: Normocephalic and atraumatic.  Eyes:     Conjunctiva/sclera: Conjunctivae normal.     Comments: Left pupil 2 mm and reactive to light. Right pupil 4 mm and nonreactive. Evidence of prior ocular surgery in right eye.   Cardiovascular:     Rate and Rhythm: Tachycardia present. Rhythm irregular.  Pulmonary:     Effort: Pulmonary effort is normal.     Breath sounds: Normal breath sounds.  Abdominal:     General: Abdomen is flat.     Palpations: Abdomen is soft.     Tenderness: There is abdominal tenderness. There is no guarding or rebound.  Skin:    General: Skin is warm.     Capillary Refill: Capillary refill takes less than 2 seconds.     Findings: No rash.  Neurological:     Mental Status: She is lethargic.     GCS: GCS eye subscore is 2. GCS verbal subscore is 3. GCS motor subscore is 5.     ED Results / Procedures / Treatments   Labs (all labs ordered are listed, but only abnormal results are displayed) Labs Reviewed  CBC - Abnormal; Notable for the following components:      Result Value   MCV 103.5 (*)    MCH 35.4 (*)    All other components within normal limits  COMPREHENSIVE METABOLIC PANEL - Abnormal; Notable for the following components:   Glucose, Bld 161 (*)    BUN 7 (*)    Total Bilirubin 1.4 (*)    All other components within normal limits  URINALYSIS, ROUTINE W REFLEX MICROSCOPIC - Abnormal; Notable for the following components:   Color, Urine STRAW (*)    Hgb urine dipstick SMALL (*)    All other components within normal limits  I-STAT CHEM 8, ED - Abnormal; Notable for the following components:   Glucose, Bld 164 (*)    Hemoglobin 15.6 (*)    All other components within normal limits  CBG MONITORING, ED - Abnormal; Notable for the following  components:   Glucose-Capillary 149 (*)    All other components within normal limits  ETHANOL  PROTIME-INR  APTT  DIFFERENTIAL  RAPID URINE DRUG SCREEN, HOSP PERFORMED  LIPASE, BLOOD  TSH    EKG EKG Interpretation  Date/Time:  Wednesday November 27 2021 09:43:27 EDT Ventricular Rate:  86 PR Interval:    QRS Duration: 85 QT Interval:  311 QTC Calculation: 372 R Axis:   91 Text Interpretation: Atrial fibrillation Right axis deviation Low voltage, precordial leads Borderline repol abnormality,  diffuse leads Confirmed by Edwin Dada ([id number]) on 11/27/2021 1:37:44 PM  Radiology CT HEAD WO CONTRAST  Result Date: 11/27/2021 CLINICAL DATA:  Altered mental status EXAM: CT HEAD WITHOUT CONTRAST CT CERVICAL SPINE WITHOUT CONTRAST TECHNIQUE: Multidetector CT imaging of the head and cervical spine was performed following the standard protocol without intravenous contrast. Multiplanar CT image reconstructions of the cervical spine were also generated. RADIATION DOSE REDUCTION: This exam was performed according to the departmental dose-optimization program which includes automated exposure control, adjustment of the mA and/or kV according to patient size and/or use of iterative reconstruction technique. COMPARISON:  CT/CTA head and neck 11/27/2020, CT head and cervical spine 07/08/2021 FINDINGS: CT HEAD FINDINGS Brain: There is no acute intracranial hemorrhage, extra-axial fluid collection, or acute infarct There is mild background parenchymal volume loss with prominence of the ventricular system and extra-axial CSF spaces. Patchy hypodensity in the supratentorial white matter likely reflects sequela of underlying chronic small vessel ischemic change. There is no mass lesion.  There is no mass effect or midline shift. Vascular: There is calcification of the bilateral carotid siphons. Skull: Normal. Negative for fracture or focal lesion. Sinuses/Orbits: The paranasal sinuses are clear. Bilateral lens  implants are in place. The globes and orbits are otherwise unremarkable. Other: None. CT CERVICAL SPINE FINDINGS Alignment: There is trace anterolisthesis of C6 on C7 and C7 on T1, unchanged and likely degenerative in nature. There is no jumped or perched facet or other evidence of traumatic malalignment. Skull base and vertebrae: Skull base alignment is maintained. Vertebral body heights are preserved. There is no evidence of acute fracture. There is no suspicious osseous lesion. Soft tissues and spinal canal: Choose Disc levels: There is multilevel disc space narrowing and degenerative endplate change, most advanced at C5-C6 and C6-C7. Is multilevel bulky facet arthropathy, most advanced at C3-C4 and C4-C5 on the right. Upper chest: The imaged lung apices are clear. Other: None. IMPRESSION: 1. No acute intracranial pathology. 2. No acute fracture or traumatic malalignment of the cervical spine. Electronically Signed   By: Lesia Hausen M.D.   On: 11/27/2021 11:38   CT Cervical Spine Wo Contrast  Result Date: 11/27/2021 CLINICAL DATA:  Altered mental status EXAM: CT HEAD WITHOUT CONTRAST CT CERVICAL SPINE WITHOUT CONTRAST TECHNIQUE: Multidetector CT imaging of the head and cervical spine was performed following the standard protocol without intravenous contrast. Multiplanar CT image reconstructions of the cervical spine were also generated. RADIATION DOSE REDUCTION: This exam was performed according to the departmental dose-optimization program which includes automated exposure control, adjustment of the mA and/or kV according to patient size and/or use of iterative reconstruction technique. COMPARISON:  CT/CTA head and neck 11/27/2020, CT head and cervical spine 07/08/2021 FINDINGS: CT HEAD FINDINGS Brain: There is no acute intracranial hemorrhage, extra-axial fluid collection, or acute infarct There is mild background parenchymal volume loss with prominence of the ventricular system and extra-axial CSF  spaces. Patchy hypodensity in the supratentorial white matter likely reflects sequela of underlying chronic small vessel ischemic change. There is no mass lesion.  There is no mass effect or midline shift. Vascular: There is calcification of the bilateral carotid siphons. Skull: Normal. Negative for fracture or focal lesion. Sinuses/Orbits: The paranasal sinuses are clear. Bilateral lens implants are in place. The globes and orbits are otherwise unremarkable. Other: None. CT CERVICAL SPINE FINDINGS Alignment: There is trace anterolisthesis of C6 on C7 and C7 on T1, unchanged and likely degenerative in nature. There is no jumped or perched facet or other  evidence of traumatic malalignment. Skull base and vertebrae: Skull base alignment is maintained. Vertebral body heights are preserved. There is no evidence of acute fracture. There is no suspicious osseous lesion. Soft tissues and spinal canal: Choose Disc levels: There is multilevel disc space narrowing and degenerative endplate change, most advanced at C5-C6 and C6-C7. Is multilevel bulky facet arthropathy, most advanced at C3-C4 and C4-C5 on the right. Upper chest: The imaged lung apices are clear. Other: None. IMPRESSION: 1. No acute intracranial pathology. 2. No acute fracture or traumatic malalignment of the cervical spine. Electronically Signed   By: Valetta Mole M.D.   On: 11/27/2021 11:38   CT ABDOMEN PELVIS W CONTRAST  Result Date: 11/27/2021 CLINICAL DATA:  Abdominal pain, altered mental status.  Tenderness. EXAM: CT ABDOMEN AND PELVIS WITH CONTRAST TECHNIQUE: Multidetector CT imaging of the abdomen and pelvis was performed using the standard protocol following bolus administration of intravenous contrast. RADIATION DOSE REDUCTION: This exam was performed according to the departmental dose-optimization program which includes automated exposure control, adjustment of the mA and/or kV according to patient size and/or use of iterative reconstruction  technique. CONTRAST:  53mL OMNIPAQUE IOHEXOL 350 MG/ML SOLN COMPARISON:  CT abdomen/pelvis 11/17/2021 FINDINGS: Lower chest: There are right larger than left pleural effusions with mild adjacent atelectasis in the lower lobes, similar to the prior CT from 11/17/2021. The heart size is stable. Coronary artery calcifications are again seen. There is no pericardial effusion. Hepatobiliary: The liver is unremarkable. The gallbladder is surgically absent. There is no biliary ductal dilatation. Pancreas: Unremarkable. Spleen: Unremarkable. Adrenals/Urinary Tract: The adrenals are unremarkable. The kidneys are unremarkable, with no focal lesion, stone, hydronephrosis, or hydroureter. There is symmetric excretion of contrast into the collecting systems on the delayed images. Bladder is unremarkable. Stomach/Bowel: There is a small hiatal hernia. The stomach is otherwise unremarkable. There is no evidence of bowel obstruction. There is no abnormal bowel wall thickening or inflammatory change. There is colonic diverticulosis without evidence of acute diverticulitis. There is no evidence of appendicitis. Vascular/Lymphatic: There is calcified atherosclerotic plaque throughout the nonaneurysmal abdominal aorta. The major branch vessels are patent. The main portal and splenic veins are patent. There is no abdominopelvic lymphadenopathy. Reproductive: The uterus and adnexa are unremarkable. Other: There is no ascites or free air. Musculoskeletal: There is no acute osseous abnormality or suspicious osseous lesion. Postsurgical changes are again noted in the left femur. A remote fracture of the left inferior pubic ramus is again seen. Compression fractures of the T11 through L2 vertebral bodies are unchanged since the recent CT; however, the compression deformity of T11 is slightly progressed since the study from 11/16/2020. IMPRESSION: 1. No acute finding in the abdomen or pelvis or significant change since 11/17/2021. 2. Colonic  diverticulosis without evidence of acute diverticulitis. 3. Unchanged small right larger than left pleural effusions with mild adjacent atelectasis. 4. Compression deformities of the T11 through L2 vertebral bodies are unchanged since the CT from 11/17/2021; however, the compression deformity of T11 is slightly progressed since 11/16/2020. Correlate with any symptoms, and consider lumbar spine MRI to evaluate for acute/subacute fracture if indicated. Electronically Signed   By: Valetta Mole M.D.   On: 11/27/2021 11:15   DG Chest Portable 1 View  Result Date: 11/27/2021 CLINICAL DATA:  Shortness of breath EXAM: PORTABLE CHEST 1 VIEW COMPARISON:  Shortness of breath FINDINGS: Cardiac and mediastinal contours are unchanged. Bibasilar atelectasis. Small left pleural effusion. No evidence of pneumothorax. IMPRESSION: Bibasilar atelectasis and small left pleural  effusion. Electronically Signed   By: Yetta Glassman M.D.   On: 11/27/2021 09:34    Procedures .Critical Care  Performed by: Lianne Cure, DO Authorized by: Lianne Cure, DO   Critical care provider statement:    Critical care time (minutes):  30   Critical care was necessary to treat or prevent imminent or life-threatening deterioration of the following conditions: AMS.   Critical care was time spent personally by me on the following activities:  Development of treatment plan with patient or surrogate, discussions with consultants, evaluation of patient's response to treatment, examination of patient, ordering and review of laboratory studies, ordering and review of radiographic studies, ordering and performing treatments and interventions, pulse oximetry, re-evaluation of patient's condition and review of old [place] discussed with: admitting provider       Medications Ordered in ED Medications  iohexol (OMNIPAQUE) 350 MG/ML injection 75 mL (75 mLs Intravenous Contrast Given 11/27/21 1049)  sodium chloride 0.9 % bolus 500 mL  (500 mLs Intravenous New Bag/Given 11/27/21 1231)    ED Course/ Medical Decision Making/ A&P                           Medical Decision Making Amount and/or Complexity of Data Reviewed Labs: ordered. Radiology: ordered.   2:18 PM  88 yo female with pmh of CAD, GERD, hypertension, hyperlipidemia, and hypothyroidism presenting via ems from assisted nursing facility for AMS.  On exam patient is sleepy but arousable, oriented to name only, afebrile, with A-fib with PVCs but a stable heart rate and otherwise stable vital signs.  No hypoxia or signs of respiratory distress.  Patient has clear bilateral breath sounds with no adventitious lung sounds.  Patient does have some abdominal tenderness and winces on physical exam.  Abdomen is otherwise soft.  Review demonstrates patient was recently seen in the last month at [place] center for evaluation of abdominal pain.  Patient had a CAT scan at that time that demonstrated no acute process.  Patient was found to have several compression fractures of the T12-L3 lumbar spine.  Family reports no falls during that time.  Currently her back pain has been worsening which prompted her primary care physician to prescribe her stronger pain medications.  The daughter requested no narcotics.  Patient was prescribed a muscle relaxer that was started yesterday.  New Medication started yesterday After long conversation with the patient's daughter it sounds like her baseline is alert and oriented x3, able to ambulate with minimal assistance, able to feed and dress herself.  She does live at a senior nursing facility.  There is a high likelihood that patient's current mental status is secondary to muscle relaxer initiation.  AMS Currently patient has no signs or symptoms of sepsis.  Stable EKG, troponins, electrolytes, chest x-ray.  Urinalysis demonstrates no urinary tract infection.  Chest x-ray demonstrates no focal opacities.  Patient has no hypoxia.  No  anemia.  No hypoglycemia.  Patient does have a history of both thyroidism.  Thyroid function studies are pending.  CT scan of the head and neck demonstrates no acute process.  CT abdomen demonstrates no acute process.  Patient accepted by family medicine teaching service.         Final Clinical Impression(s) / ED Diagnoses Final diagnoses:  Altered mental status, unspecified altered mental status type    Rx / DC Orders ED Discharge Orders     None  Campbell Stall P, DO [id number]

## 2021-11-28 DIAGNOSIS — M8008XA Age-related osteoporosis with current pathological fracture, vertebra(e), initial encounter for fracture: Secondary | ICD-10-CM | POA: Diagnosis not present

## 2021-11-28 DIAGNOSIS — G929 Unspecified toxic encephalopathy: Secondary | ICD-10-CM | POA: Diagnosis not present

## 2021-11-28 DIAGNOSIS — R401 Stupor: Secondary | ICD-10-CM | POA: Diagnosis present

## 2021-11-28 LAB — COMPREHENSIVE METABOLIC PANEL
ALT: 12 U/L (ref 0–44)
AST: 15 U/L (ref 15–41)
Albumin: 3.5 g/dL (ref 3.5–5.0)
Alkaline Phosphatase: 78 U/L (ref 38–126)
Anion gap: 16 — ABNORMAL HIGH (ref 5–15)
BUN: 10 mg/dL (ref 8–23)
CO2: 23 mmol/L (ref 22–32)
Calcium: 10.2 mg/dL (ref 8.9–10.3)
Chloride: 104 mmol/L (ref 98–111)
Creatinine, Ser: 0.86 mg/dL (ref 0.44–1.00)
GFR, Estimated: >60 mL/min (ref >60)
Glucose, Bld: 131 mg/dL — ABNORMAL HIGH (ref 70–99)
Potassium: 3.5 mmol/L (ref 3.5–5.1)
Sodium: 143 mmol/L (ref 135–145)
Total Bilirubin: 1.6 mg/dL — ABNORMAL HIGH (ref 0.3–1.2)
Total Protein: 6.6 g/dL (ref 6.5–8.1)

## 2021-11-28 LAB — FOLATE: Folate: 14.7 ng/mL (ref >5.9)

## 2021-11-28 LAB — CBC
HCT: 40.2 % (ref 36.0–46.0)
Hemoglobin: 13.8 g/dL (ref 12.0–15.0)
MCH: 35.5 pg — ABNORMAL HIGH (ref 26.0–34.0)
MCHC: 34.3 g/dL (ref 30.0–36.0)
MCV: 103.3 fL — ABNORMAL HIGH (ref 80.0–100.0)
Platelets: 188 10*3/uL (ref 150–400)
RBC: 3.89 MIL/uL (ref 3.87–5.11)
RDW: 12.7 % (ref 11.5–15.5)
WBC: 5.8 10*3/uL (ref 4.0–10.5)
nRBC: 0 % (ref 0.0–0.2)

## 2021-11-28 LAB — TSH: TSH: 3.989 u[IU]/mL (ref 0.350–4.500)

## 2021-11-28 MED ORDER — POLYETHYLENE GLYCOL 3350 17 G PO PACK
17.0000 g | PACK | Freq: Every day | ORAL | Status: DC
  Administered 2021-11-28 – 2021-11-30 (×3): 17 g via ORAL
  Filled 2021-11-28 (×3): qty 1

## 2021-11-28 MED ORDER — FOLIC ACID 1 MG PO TABS
1.0000 mg | ORAL_TABLET | Freq: Every day | ORAL | Status: DC
  Administered 2021-11-28 – 2021-12-03 (×6): 1 mg via ORAL
  Filled 2021-11-28 (×6): qty 1

## 2021-11-28 MED ORDER — DILTIAZEM HCL ER 60 MG PO CP12
60.0000 mg | ORAL_CAPSULE | Freq: Two times a day (BID) | ORAL | Status: DC
  Administered 2021-11-28 – 2021-12-02 (×8): 60 mg via ORAL
  Filled 2021-11-28 (×9): qty 1

## 2021-11-28 MED ORDER — KETOROLAC TROMETHAMINE 30 MG/ML IJ SOLN
30.0000 mg | Freq: Once | INTRAMUSCULAR | Status: AC
  Administered 2021-11-28: 30 mg via INTRAVENOUS
  Filled 2021-11-28: qty 1

## 2021-11-28 MED ORDER — HEPARIN (PORCINE) 25000 UT/250ML-% IV SOLN
850.0000 [IU]/h | INTRAVENOUS | Status: DC
  Filled 2021-11-28: qty 250

## 2021-11-28 MED ORDER — ACETAMINOPHEN 500 MG PO TABS
1000.0000 mg | ORAL_TABLET | Freq: Four times a day (QID) | ORAL | Status: DC
  Administered 2021-11-28 – 2021-11-29 (×4): 1000 mg via ORAL
  Filled 2021-11-28 (×4): qty 2

## 2021-11-28 MED ORDER — PANTOPRAZOLE SODIUM 40 MG PO TBEC
40.0000 mg | DELAYED_RELEASE_TABLET | Freq: Every day | ORAL | Status: DC
  Administered 2021-11-28 – 2021-12-03 (×6): 40 mg via ORAL
  Filled 2021-11-28 (×6): qty 1

## 2021-11-28 MED ORDER — VITAMIN D 25 MCG (1000 UNIT) PO TABS
1000.0000 [IU] | ORAL_TABLET | Freq: Every day | ORAL | Status: DC
  Administered 2021-11-28 – 2021-12-03 (×6): 1000 [IU] via ORAL
  Filled 2021-11-28 (×6): qty 1

## 2021-11-28 MED ORDER — APIXABAN 2.5 MG PO TABS
2.5000 mg | ORAL_TABLET | Freq: Two times a day (BID) | ORAL | Status: DC
  Administered 2021-11-28 – 2021-12-03 (×10): 2.5 mg via ORAL
  Filled 2021-11-28 (×10): qty 1

## 2021-11-28 MED ORDER — MUSCLE RUB 10-15 % EX CREA
TOPICAL_CREAM | Freq: Two times a day (BID) | CUTANEOUS | Status: DC
  Filled 2021-11-28: qty 85

## 2021-11-28 MED ORDER — HYDROMORPHONE HCL 1 MG/ML IJ SOLN
0.5000 mg | Freq: Once | INTRAMUSCULAR | Status: AC
  Administered 2021-11-28: 0.5 mg via INTRAVENOUS
  Filled 2021-11-28: qty 1

## 2021-11-28 MED ORDER — ACETAMINOPHEN 10 MG/ML IV SOLN
1000.0000 mg | Freq: Three times a day (TID) | INTRAVENOUS | Status: DC
  Administered 2021-11-28: 1000 mg via INTRAVENOUS
  Filled 2021-11-28 (×2): qty 100

## 2021-11-28 MED ORDER — PRAVASTATIN SODIUM 40 MG PO TABS
80.0000 mg | ORAL_TABLET | Freq: Every day | ORAL | Status: DC
  Administered 2021-11-28 – 2021-11-29 (×2): 80 mg via ORAL
  Filled 2021-11-28 (×2): qty 2

## 2021-11-28 MED ORDER — LEVOTHYROXINE SODIUM 25 MCG PO TABS
25.0000 ug | ORAL_TABLET | Freq: Every day | ORAL | Status: DC
  Administered 2021-11-29 – 2021-12-03 (×5): 25 ug via ORAL
  Filled 2021-11-28 (×5): qty 1

## 2021-11-28 NOTE — Progress Notes (Cosign Needed Addendum)
HD#0 Subjective:   Summary: Charlotte Powell is a 88 year old female with a past medical history significant for CAD s/p PCI w/ DES to RCA in 2004, hypertension, persistent persistent A-fib on cardizem and eliquis, hypothyroidism on levothyroxine, GERD, ,neuropathy on gabapentin, and acute on chronic back pain from chronic thoracic and vertebral compression fractures who p/w acute toxic encephalopathy in the setting of recent initiation of baclofen.   Overnight Events: Night team to bedside for pain. Provided 0.5 dilaudid at midnight and again at 6:30am.  Discussed patient's history and symptoms with daughter today on rounds. Patient had pain initially in her epigastrium and LUQ that has migrated initially across abdomen including RUQ but then more recently left lower back extending anteriorly to LUQ. Pain worse when turning to the side. She was seen at [facility] ED with normal abdominal imaging but back imaging with some chronic fractures. Daughter states patient saw NP at facility on Tuesday and received baclofen. Unsure if one or two doses, but patient found unresponsive following morning. She was previously fully conversant and able to feed herself. Patient has not been able to speak or feed herself since being found obtunded Wednesday morning. Reports use of lidocaine patch at her left shoulder.  Objective:  Vital signs in last 24 hours: Vitals:   11/28/21 1200 11/28/21 1353 11/28/21 1356 11/28/21 1438  BP: (!) 151/79 (!) 159/86  (!) 159/75  Pulse: 82 81  80  Resp: 17 (!) 21  17  Temp:   98.8 F (37.1 C) 97.9 F (36.6 C)  TempSrc:   Axillary Axillary  SpO2: 95% 94%  96%  Weight:      Height:       Supplemental O2: Room Air SpO2: 96 %   Physical Exam:  Constitutional: somnolent, elderly female lying in hospital bed, in no acute distress HENT: normocephalic atraumatic, mucous membranes moist, scar at left forehead Eyes: conjunctiva non-erythematous Neck:  supple Cardiovascular: normal rate, irregular rhythm, no m/r/g Pulmonary/Chest: normal work of breathing on room air, lungs clear to auscultation anteriorly Abdominal: soft, tympanic, no tenderness to palpation in all quadrants MSK: normal bulk and tone, tenderness to palpation at left lower back Neurological: does not follow commands, arouses to voice, says a few words but not oriented to person, place or time. Skin: warm and dry Psych: Somnolent, pleasant when awake  Filed Weights   11/27/21 0913  Weight: 64.4 kg     Intake/Output Summary (Last 24 hours) at 11/28/2021 1609 Last data filed at 11/28/2021 1112 Gross per 24 hour  Intake 100 ml  Output --  Net 100 ml   Net IO Since Admission: 600 mL [11/28/21 1609]  Pertinent Labs:    Latest Ref Rng & Units 11/28/2021    4:42 AM 11/27/2021    9:32 AM 11/27/2021    9:25 AM  CBC  WBC 4.0 - 10.5 K/uL 5.8   6.9   Hemoglobin 12.0 - 15.0 g/dL 99.3  71.6  96.7   Hematocrit 36.0 - 46.0 % 40.2  46.0  43.9   Platelets 150 - 400 K/uL 188   177        Latest Ref Rng & Units 11/28/2021    4:42 AM 11/27/2021    9:32 AM 11/27/2021    9:25 AM  CMP  Glucose 70 - 99 mg/dL 893  810  175   BUN 8 - 23 mg/dL 10  8  7    Creatinine 0.44 - 1.00 mg/dL  1.02  5.85  Sodium 135 - 145 mmol/L 143  142  142   Potassium 3.5 - 5.1 mmol/L 3.5  3.5  3.5   Chloride 98 - 111 mmol/L 104  106  110   CO2 22 - 32 mmol/L 23   24   Calcium 8.9 - 10.3 mg/dL 53.9   9.8   Total Protein 6.5 - 8.1 g/dL 6.6   7.2   Total Bilirubin 0.3 - 1.2 mg/dL 1.6   1.4   Alkaline Phos 38 - 126 U/L 78   88   AST 15 - 41 U/L 15   18   ALT 0 - 44 U/L 12   12     Imaging: No results found.  Assessment/Plan:   Principal Problem:   Toxic encephalopathy   Patient Summary: Charlotte Powell is a 88 year old female with a past medical history significant for CAD s/p PCI w/ DES to RCA in 2004, hypertension, persistent persistent A-fib on cardizem and eliquis,  hypothyroidism on levothyroxine, GERD, and neuropathy on gabapentin who p/w acute toxic encephalopathy in the setting of recent initiation of baclofen for a month of chronic abdominal pain of unclear etiology.    #Toxic encephalopathy in the setting of recent initiation of baclofen  Patient was noted to have recently started baclofen (unclear dose) for persistent abdominal pain of 1 month of unclear etiology presenting with altered mental status. Patient seems to be improving today though still not at baseline of being fully conversant and able to feed herself. Will continue to hold centrally acting medications and treat pain with lidocaine patch and Toradol sparingly.  -Continue to monitor mental and respiratory status -Hold baclofen, discontinue this at discharge as patient has not been on this chronically  -D/c IV fluids (patient drinking now) -SLP consulted, will reassess again tomorrow. Restart po tylenol once able to tolerate pills.   #Abdominal pain of unclear etiology Per patient's daughter this has been persistent since the beginning of the month. She has not had any associated symptoms, however the pain is worsened with movement to the side. Patient has not had any recent trauma either. Patient has had a recent negative work up as well with CT A/P being negative for intraabdominal pathology on 10/01. Of note, however, she has compression fractures of T11-L2 which were noted to be chronic on CT A/P 10/01. Pain with palpation of left lower back but not abdomen. Feel this most likely to be radiculopathy given t/l spine compression fractures and corresponding dermatome.  -Lidocaine patches for pain and tylenol when able  #Atraumatic compression fractures Pt has compression fractures of T11-L2 which were noted to be chronic on CT A/P 10/01. No history of trauma. Pain with palpation of left lower back but not abdomen. Feel this most likely to be radiculopathy given t/l spine compression fractures  and corresponding dermatome. This qualifies her for diagnosis of osteoporosis.  -cholecalciferol 1000 units daily when able to tolerate capsule  #Persistent atrial fibrillation On eliquis and cardizem at home. Her chads vasc is 5, HAS Bled 2. She is currently rate controlled at home. Rate controlled currently. -restart home diltiazem when able to tolerate tablets -heparin dosing er pharmacy   #CAD s/p PCI w/ RCA  No chest pain. Hold her home medications. Continue to monitor.   -restart pravastatin when able to tolerate tablets   #Hypothyroidism -Will start IV levothyroxine at half her dose  -transition to oral when able (need to determine dose)   Dispo: Admit patient to Observation with expected length  of stay less than 2 midnights.  Linward Natal MD Internal Medicine Resident PGY-1 Please contact the on call pager after 5 pm and on weekends at [phone].

## 2021-11-28 NOTE — ED Notes (Signed)
Pt is more responsive/awake. Pt is able to take small sips of water.

## 2021-11-28 NOTE — Evaluation (Signed)
Clinical/Bedside Swallow Evaluation Patient Details  Name: Charlotte Powell MRN: [id number] Date of Birth: January 13, 1929  Today's Date: 11/28/2021 Time: SLP Start Time (ACUTE ONLY): 1030 SLP Stop Time (ACUTE ONLY): 1041 SLP Time Calculation (min) (ACUTE ONLY): 11 min  Past Medical History:  Past Medical History:  Diagnosis Date   CAD (coronary artery disease)    Diverticulitis    GERD (gastroesophageal reflux disease)    HLD (hyperlipidemia)    HTN (hypertension)    Hypothyroidism    Past Surgical History:  Past Surgical History:  Procedure Laterality Date   ANKLE FRACTURE SURGERY     CHOLECYSTECTOMY     CORONARY STENT PLACEMENT     FEMUR FRACTURE SURGERY     WRIST FRACTURE SURGERY     HPI:  Patient is a 88 yo worman who presented via ems from assisted nursing facility for AMS. Pt was reported to have been found minimally responsive this morning with some emesis in route.  Head CT 10/11 with no acute findings.  CXR 10/11: "Bibasilar atelectasis and small left pleural effusion". Pt with pmh of CAD, GERD, hypertension, hyperlipidemia, and hypothyroidism.    Assessment / Plan / Recommendation  Clinical Impression  Pt presents with a moderate oral dyspahgia which is seemingly related to AMS.  Pt is known to this service from prior admission with grossly normal swallow assessement 11/28/2020.  Daughter reports pt with no difficulty with POs PTA.  Pt's chest imaging not especially concerning for pneumonia and no hx dysphagia.  Pt did not follow directions for OME, would not allow visualization of oral cavity, and would not accept majority of bolus trials today.  Pt took small sips of water by straw with no clinical s/s of aspiration.  Pt sucked a small amount of puree off of tip of spoon but would not allow SLP to place applesauce in oral cavity.  With regular solid graham cracker pt sucked on end of cracker.  SLP will follow for additional assessment of solids as AMS improves.    Recommend  initiating full liquid diet at present.    Administer medications by alternate means as necessary.  Daughter states pt takes medications whole with puree at baseline, and this can be attempted with oral medications if pt is agreeable.  SLP Visit Diagnosis: Dysphagia, unspecified (R13.10)    Aspiration Risk  Mild aspiration risk    Diet Recommendation Thin liquid   Liquid Administration via: Cup;Straw Medication Administration: Whole meds with puree (or via alternate means) Supervision: Full supervision/cueing for compensatory strategies;Staff to assist with self feeding Compensations: Slow rate;Small sips/bites Postural Changes: Seated upright at 90 degrees;Remain upright for at least 30 minutes after po intake    Other  Recommendations Oral Care Recommendations: Oral care BID    Recommendations for follow up therapy are one component of a multi-disciplinary discharge planning process, led by the attending physician.  Recommendations may be updated based on patient status, additional functional criteria and insurance authorization.  Follow up Recommendations  (TBD)      Assistance Recommended at Discharge Intermittent Supervision/Assistance  Functional Status Assessment Patient has had a recent decline in their functional status and demonstrates the ability to make significant improvements in function in a reasonable and predictable amount of time.  Frequency and Duration min 2x/week  2 weeks       Prognosis Prognosis for Safe Diet Advancement: Good      Swallow Study   General Date of Onset: 11/27/21 HPI: Patient is a 88 yo worman who  presented via ems from assisted nursing facility for AMS. Pt was reported to have been found minimally responsive this morning with some emesis in route.  Head CT 10/11 with no acute findings.  CXR 10/11: "Bibasilar atelectasis and small left pleural effusion". Pt with pmh of CAD, GERD, hypertension, hyperlipidemia, and hypothyroidism. Type of  Study: Bedside Swallow Evaluation Previous Swallow Assessment: BSE 11/28/2020 Diet Prior to this Study: NPO Temperature Spikes Noted: No History of Recent Intubation: No Behavior/Cognition: Alert;Uncooperative;Doesn't follow directions Oral Cavity Assessment:  (Could not assess) Oral Care Completed by SLP: No Oral Cavity - Dentition: Adequate natural dentition (could not fully assess) Self-Feeding Abilities: Total assist Patient Positioning: Upright in bed Baseline Vocal Quality: Not observed Volitional Cough: Cognitively unable to elicit Volitional Swallow: Unable to elicit    Oral/Motor/Sensory Function Overall Oral Motor/Sensory Function:  (Unable to assess) Facial Symmetry: Within Functional Limits   Ice Chips Ice chips: Not tested   Thin Liquid Thin Liquid: Within functional limits Presentation: Straw    Nectar Thick Nectar Thick Liquid: Not tested   Honey Thick Honey Thick Liquid: Not tested   Puree Puree: Impaired Oral Phase Impairments: Poor awareness of bolus   Solid     Solid: Impaired Oral Phase Impairments: Impaired mastication;Poor awareness of bolus      Celedonio Savage, MA, [place] Office: [phone] 11/28/2021,11:03 AM

## 2021-11-28 NOTE — Plan of Care (Signed)
  Problem: Clinical Measurements: Goal: Will remain free from infection Outcome: Progressing Goal: Respiratory complications will improve Outcome: Progressing Goal: Cardiovascular complication will be avoided Outcome: Progressing   Problem: Activity: Goal: Risk for activity intolerance will decrease Outcome: Progressing   Problem: Coping: Goal: Level of anxiety will decrease Outcome: Progressing   

## 2021-11-28 NOTE — Evaluation (Signed)
Occupational Therapy Evaluation Patient Details Name: Charlotte Powell MRN: [id number] DOB: 1928-03-01 Today's Date: 11/28/2021   History of Present Illness Pt is a 88 y/o female who presented with toxic encephalopathy and progressive left upper quadrant pain. PMH: CAD s/p PCI w/ DES to RCA , a fib, hypothyroidism, GERD, and neuropathy.   Clinical Impression   PTA, pt resides at an ALF, typically Modified Independent with toileting, sponge bathing and mobility using RW. Pt receives assist for showers and intermittent assist with dressing d/t chronic L shoulder impairments. Pt presents now with deficits in cognition, sitting balance, strength and overall endurance. Pt alert, intermittently responding to questions and follows commands fairly consistently. Overall, pt requires Max-Total A x 2 for bed mobility, Max A to maintain sitting balance w/ posterior bias. Pt requires Max-Total A for UB/LB ADLs and unable to safely attempt standing from stretcher today. Recommend SNF rehab at DC based on current presentation. Anticipate good progress as mentation improves.   VSS on RA      Recommendations for follow up therapy are one component of a multi-disciplinary discharge planning process, led by the attending physician.  Recommendations may be updated based on patient status, additional functional criteria and insurance authorization.   Follow Up Recommendations  Skilled nursing-short term rehab (<3 hours/day)    Assistance Recommended at Discharge Frequent or constant Supervision/Assistance  Patient can return home with the following Two people to help with walking and/or transfers;Two people to help with bathing/dressing/bathroom    Functional Status Assessment  Patient has had a recent decline in their functional status and demonstrates the ability to make significant improvements in function in a reasonable and predictable amount of time.  Equipment Recommendations   (TBD pending progress)     Recommendations for Other Services       Precautions / Restrictions Precautions Precautions: Fall;Other (comment) Precaution Comments: fragile skin Restrictions Weight Bearing Restrictions: No      Mobility Bed Mobility Overal bed mobility: Needs Assistance Bed Mobility: Supine to Sit, Sit to Supine     Supine to sit: Max assist, +2 for physical assistance, +2 for safety/equipment, HOB elevated Sit to supine: Total assist, +2 for physical assistance, +2 for safety/equipment   General bed mobility comments: Delayed motor planning and multimodal cues needed to complete task with significant assist for LE and trunk advancement    Transfers                   General transfer comment: unable due to poor sitting balance and high stretcher height      Balance Overall balance assessment: Needs assistance Sitting-balance support: No upper extremity supported, Feet supported Sitting balance-Leahy Scale: Zero Sitting balance - Comments: heavy assist needed to maintain due to posterior bias Postural control: Posterior lean                                 ADL either performed or assessed with clinical judgement   ADL Overall ADL's : Needs assistance/impaired Eating/Feeding: Moderate assistance;Bed level Eating/Feeding Details (indicate cue type and reason): daughter has been assisting pt with meals Grooming: Moderate assistance;Bed level   Upper Body Bathing: Maximal assistance;Bed level   Lower Body Bathing: Total assistance;Bed level   Upper Body Dressing : Maximal assistance;Bed level   Lower Body Dressing: Total assistance;Bed level       Toileting- Clothing Manipulation and Hygiene: Total assistance;Bed level  General ADL Comments: Abdominal pain tolerable with activity though pt with significant deficits in strength and sitting balance with poor postural control. Also complicated by cognitive deficits     Vision Ability to See in  Adequate Light: 0 Adequate Patient Visual Report: No change from baseline Vision Assessment?: No apparent visual deficits     Perception     Praxis      Pertinent Vitals/Pain Pain Assessment Pain Assessment: Faces Faces Pain Scale: Hurts little more Pain Location: abdomen with bed mobility Pain Descriptors / Indicators: Grimacing, Guarding Pain Intervention(s): Monitored during session     Hand Dominance Right   Extremity/Trunk Assessment Upper Extremity Assessment Upper Extremity Assessment: LUE deficits/detail LUE Deficits / Details: chronic L shoulder ROM deficits. crepitation noted with minimal ROM LUE Coordination: decreased gross motor   Lower Extremity Assessment Lower Extremity Assessment: Defer to PT evaluation   Cervical / Trunk Assessment Cervical / Trunk Assessment: Kyphotic   Communication Communication Communication: Expressive difficulties;Other (comment) (variable responses)   Cognition Arousal/Alertness: Awake/alert Behavior During Therapy: Flat affect Overall Cognitive Status: Impaired/Different from baseline Area of Impairment: Attention, Memory, Following commands, Awareness, Safety/judgement, Problem solving                   Current Attention Level: Sustained Memory: Decreased short-term memory Following Commands: Follows one step commands with increased time Safety/Judgement: Decreased awareness of deficits Awareness: Intellectual Problem Solving: Slow processing, Difficulty sequencing, Requires verbal cues General Comments: alert, answers some basic yes/no questions and at other times, does not respond to questions. able to follow basic commands though delayed motor planning noted.     General Comments       Exercises     Shoulder Instructions      Home Living Family/patient expects to be discharged to:: Assisted living                             Home Equipment: Hand held shower head;Grab bars - tub/shower;Grab  bars - toilet;Rolling Walker (2 wheels)   Additional Comments: Pt from [facility]      Prior Functioning/Environment Prior Level of Function : Needs assist             Mobility Comments: RW for mobility, walks to dining hall and around apartment without issue ADLs Comments: Pt typically able to sponge bathe and toilet self. assistance from staff for showers and intermittent assist for UB dressing d/t L shoulder impairments        OT Problem List: Decreased strength;Decreased activity tolerance;Impaired balance (sitting and/or standing);Decreased cognition;Pain      OT Treatment/Interventions: Self-care/ADL training;Therapeutic exercise;Energy conservation;DME and/or AE instruction;Therapeutic activities;Patient/family education;Balance training    OT Goals(Current goals can be found in the care plan section) Acute Rehab OT Goals Patient Stated Goal: pain control, for cognition to improve OT Goal Formulation: With patient/family Time For Goal Achievement: 12/12/21 Potential to Achieve Goals: Good  OT Frequency: Min 2X/week    Co-evaluation PT/OT/SLP Co-Evaluation/Treatment: Yes Reason for Co-Treatment: To address functional/ADL transfers;For patient/therapist safety   OT goals addressed during session: ADL's and self-care;Strengthening/ROM      AM-PAC OT "6 Clicks" Daily Activity     Outcome Measure Help from another person eating meals?: A Lot Help from another person taking care of personal grooming?: A Lot Help from another person toileting, which includes using toliet, bedpan, or urinal?: Total Help from another person bathing (including washing, rinsing, drying)?: A Lot Help from another  person to put on and taking off regular upper body clothing?: A Lot Help from another person to put on and taking off regular lower body clothing?: Total 6 Click Score: 10   End of Session Nurse Communication: Mobility status  Activity Tolerance: Patient tolerated  treatment well Patient left: in bed;with call bell/phone within reach;with family/visitor present  OT Visit Diagnosis: Unsteadiness on feet (R26.81);Other abnormalities of gait and mobility (R26.89);Muscle weakness (generalized) (M62.81)                Time: 1610-9604 OT Time Calculation (min): 18 min Charges:  OT General Charges $OT Visit: 1 Visit OT Evaluation $OT Eval Moderate Complexity: 1 Mod  Bradd Canary, OTR/L Acute Rehab Services Office: [phone]   Lorre Munroe 11/28/2021, 1:59 PM

## 2021-11-28 NOTE — Progress Notes (Signed)
OT Cancellation Note  Patient Details Name: Charlotte Powell MRN: [id number] DOB: January 24, 1929   Cancelled Treatment:    Reason Eval/Treat Not Completed: Other (comment) Initiated OT eval with pt appearing more alert and responsive though not following commands. However, pt's daughter present and requested OT follow up later for evaluation as therapy "won't get much out of her right now".  Layla Maw 11/28/2021, 10:29 AM

## 2021-11-28 NOTE — ED Notes (Signed)
Pt repositioned in bed.

## 2021-11-28 NOTE — Evaluation (Signed)
Physical Therapy Evaluation Patient Details Name: Charlotte Powell MRN: [id number] DOB: 11/26/1928 Today's Date: 11/28/2021  History of Present Illness  Pt is a 88 y/o female who presented with toxic encephalopathy and progressive left upper quadrant pain. PMH: CAD s/p PCI w/ DES to RCA , a fib, hypothyroidism, GERD, and neuropathy. Admitted 11/27/21.  Clinical Impression  Pt admitted with above diagnosis. PTA ambulating at ALF with RW and grossly Mod I. Presently requires +2 Max-Total assist for bed mobility and to sit EOB with max assist for trunk control. Following simple motor commands inconsistently. Likely need SNF for rehab after d/c. Will update as appropriate as patient progresses. Pt currently with functional limitations due to the deficits listed below (see PT Problem List). Pt will benefit from skilled PT to increase their independence and safety with mobility to allow discharge to the venue listed below.          Recommendations for follow up therapy are one component of a multi-disciplinary discharge planning process, led by the attending physician.  Recommendations may be updated based on patient status, additional functional criteria and insurance authorization.  Follow Up Recommendations Skilled nursing-short term rehab (<3 hours/day) (Most likely needed but will update as she progresses.) Can patient physically be transported by private vehicle: No    Assistance Recommended at Discharge Frequent or constant Supervision/Assistance  Patient can return home with the following  Two people to help with walking and/or transfers;Two people to help with bathing/dressing/bathroom;Assistance with cooking/housework;Assistance with feeding;Direct supervision/assist for financial management;Direct supervision/assist for medications management;Assist for transportation    Equipment Recommendations None recommended by PT  Recommendations for Other Services       Functional Status  Assessment Patient has had a recent decline in their functional status and demonstrates the ability to make significant improvements in function in a reasonable and predictable amount of time.     Precautions / Restrictions Precautions Precautions: Fall;Other (comment) Precaution Comments: fragile skin Restrictions Weight Bearing Restrictions: No      Mobility  Bed Mobility Overal bed mobility: Needs Assistance Bed Mobility: Supine to Sit, Sit to Supine     Supine to sit: Max assist, +2 for physical assistance, +2 for safety/equipment, HOB elevated Sit to supine: Total assist, +2 for physical assistance, +2 for safety/equipment   General bed mobility comments: Delayed motor planning and multimodal cues needed to complete task with significant assist for LE and trunk advancement.    Transfers                   General transfer comment: unable due to poor sitting balance and high stretcher height    Ambulation/Gait                  Stairs            Wheelchair Mobility    Modified Rankin (Stroke Patients Only)       Balance Overall balance assessment: Needs assistance Sitting-balance support: No upper extremity supported, Feet supported Sitting balance-Leahy Scale: Zero Sitting balance - Comments: heavy assist needed to maintain due to posterior bias Postural control: Posterior lean                                   Pertinent Vitals/Pain Pain Assessment Pain Assessment: Faces Faces Pain Scale: Hurts little more Pain Location: abdomen with bed mobility Pain Descriptors / Indicators: Grimacing, Guarding Pain Intervention(s): Monitored during session, Repositioned  Home Living Family/patient expects to be discharged to:: Assisted living                 Home Equipment: Hand held shower head;Grab bars - tub/shower;Grab bars - toilet;Rolling Walker (2 wheels) Additional Comments: Pt from [facility]    Prior  Function Prior Level of Function : Needs assist             Mobility Comments: RW for mobility, walks to dining hall and around apartment without issue ADLs Comments: Pt typically able to sponge bathe and toilet self. assistance from staff for showers and intermittent assist for UB dressing d/t L shoulder impairments     Hand Dominance   Dominant Hand: Right    Extremity/Trunk Assessment   Upper Extremity Assessment Upper Extremity Assessment: Defer to OT evaluation LUE Deficits / Details: chronic L shoulder ROM deficits. crepitation noted with minimal ROM LUE Coordination: decreased gross motor    Lower Extremity Assessment Lower Extremity Assessment: Generalized weakness;Difficult to assess due to impaired cognition    Cervical / Trunk Assessment Cervical / Trunk Assessment: Kyphotic  Communication   Communication: Expressive difficulties;Other (comment) (variable responses)  Cognition Arousal/Alertness: Awake/alert Behavior During Therapy: Flat affect Overall Cognitive Status: Impaired/Different from baseline Area of Impairment: Attention, Memory, Following commands, Awareness, Safety/judgement, Problem solving                   Current Attention Level: Sustained Memory: Decreased short-term memory Following Commands: Follows one step commands with increased time Safety/Judgement: Decreased awareness of deficits Awareness: Intellectual Problem Solving: Slow processing, Difficulty sequencing, Requires verbal cues General Comments: alert, answers some basic yes/no questions and at other times, does not respond to questions. able to follow basic commands though delayed motor planning noted.        General Comments General comments (skin integrity, edema, etc.): VSS during tx    Exercises Other Exercises Other Exercises: Seated balance on EOB, attempting to have pt reach towards LOS with poor follow through - difficulty understanding instructions. Tol EOB 5  min   Assessment/Plan    PT Assessment Patient needs continued PT services  PT Problem List Decreased strength;Decreased range of motion;Decreased activity tolerance;Decreased balance;Decreased mobility;Decreased coordination;Decreased cognition;Decreased knowledge of use of DME;Decreased safety awareness;Decreased knowledge of precautions;Pain       PT Treatment Interventions DME instruction;Gait training;Functional mobility training;Therapeutic activities;Therapeutic exercise;Balance training;Neuromuscular re-education;Cognitive remediation;Patient/family education    PT Goals (Current goals can be found in the Care Plan section)  Acute Rehab PT Goals Patient Stated Goal: None stated PT Goal Formulation: With patient/family Time For Goal Achievement: 12/05/21 Potential to Achieve Goals: Fair    Frequency Min 3X/week     Co-evaluation PT/OT/SLP Co-Evaluation/Treatment: Yes Reason for Co-Treatment: To address functional/ADL transfers;For patient/therapist safety PT goals addressed during session: Mobility/safety with mobility;Balance OT goals addressed during session: ADL's and self-care;Strengthening/ROM       AM-PAC PT "6 Clicks" Mobility  Outcome Measure Help needed turning from your back to your side while in a flat bed without using bedrails?: Total Help needed moving from lying on your back to sitting on the side of a flat bed without using bedrails?: Total Help needed moving to and from a bed to a chair (including a wheelchair)?: Total Help needed standing up from a chair using your arms (e.g., wheelchair or bedside chair)?: Total Help needed to walk in hospital room?: Total Help needed climbing 3-5 steps with a railing? : Total 6 Click Score: 6    End  of Session   Activity Tolerance: Treatment limited secondary to medical complications (Comment) (Impaired cognition) Patient left: in bed;with call bell/phone within reach;with family/visitor present Estate manager/land agent)   PT  Visit Diagnosis: Muscle weakness (generalized) (M62.81);Difficulty in walking, not elsewhere classified (R26.2);Other symptoms and signs involving the nervous system (R29.898);Pain Pain - part of body:  (unable to state)    Time: 9211-9417 PT Time Calculation (min) (ACUTE ONLY): 13 min   Charges:   PT Evaluation $PT Eval Moderate Complexity: 1 Mod          Candie Mile, PT, DPT Physical Therapist Acute Rehabilitation Services [facility]   Ellouise Newer 11/28/2021, 2:58 PM

## 2021-11-28 NOTE — Progress Notes (Addendum)
ANTICOAGULATION CONSULT NOTE - Initial Consult  Pharmacy Consult for Heparin Indication: atrial fibrillation  Allergies  Allergen Reactions   Amoxicillin Other (See Comments)    unknown   Hydrocodone     Confusion and AMS    Patient Measurements: Height: 5\' 3"  (160 cm) Weight: 64.4 kg (142 lb) IBW/kg (Calculated) : 52.4  Vital Signs: Temp: 97.9 F (36.6 C) (10/12 1438) Temp Source: Axillary (10/12 1438) BP: 159/75 (10/12 1438) Pulse Rate: 80 (10/12 1438)  Labs: Recent Labs    11/27/21 0925 11/27/21 0932 11/28/21 0442  HGB 15.0 15.6* 13.8  HCT 43.9 46.0 40.2  PLT 177  --  188  APTT 30  --   --   LABPROT 13.6  --   --   INR 1.1  --   --   CREATININE 0.78 0.60 0.86    Estimated Creatinine Clearance: 38.5 mL/min (by C-G formula based on SCr of 0.86 mg/dL).   Medical History: Past Medical History:  Diagnosis Date   CAD (coronary artery disease)    Diverticulitis    GERD (gastroesophageal reflux disease)    HLD (hyperlipidemia)    HTN (hypertension)    Hypothyroidism     Medications:  Medications Prior to Admission  Medication Sig Dispense Refill Last Dose   acetaminophen (TYLENOL) 325 MG tablet Take 650 mg by mouth every 8 (eight) hours as needed for moderate pain.   Past Month   acetaminophen (TYLENOL) 500 MG tablet Take 1,000 mg by mouth 2 (two) times daily.   11/26/2021   apixaban (ELIQUIS) 2.5 MG TABS tablet Take 1 tablet (2.5 mg total) by mouth 2 (two) times daily. 60 tablet 2 11/26/2021 at 1800-2300   Cholecalciferol 25 MCG (1000 UT) capsule Take 1,000 Units by mouth daily.   11/26/2021   diltiazem (CARDIZEM SR) 60 MG 12 hr capsule Take 60 mg by mouth 2 (two) times daily.   11/26/2021   fluticasone (FLONASE) 50 MCG/ACT nasal spray Place 1 spray into both nostrils daily.   95/63/8756   folic acid (FOLVITE) 433 MCG tablet Take 800 mcg by mouth daily.   11/26/2021   gabapentin (NEURONTIN) 300 MG capsule Take 300 mg by mouth at bedtime.   11/26/2021    levothyroxine (SYNTHROID) 25 MCG tablet Take 25 mcg by mouth daily.   11/26/2021   lidocaine (LIDODERM) 5 % Place 1 patch onto the skin daily. Remove & Discard patch within 12 hours or as directed by MD (Patient taking differently: Place 1 patch onto the skin daily. To left shoulder.) 30 patch 0 11/26/2021   lidocaine (XYLOCAINE) 5 % ointment Apply 1 Application topically 2 (two) times daily. To lower back   11/26/2021   melatonin 5 MG TABS Take 5 mg by mouth at bedtime.   11/26/2021   nitroGLYCERIN (NITROSTAT) 0.4 MG SL tablet Place 0.4 mg under the tongue every 5 (five) minutes as needed for chest pain.   Past Week   omeprazole (PRILOSEC) 40 MG capsule Take 20 mg by mouth daily.   11/26/2021   Polyethylene Glycol 3350 POWD Take 1 Capful by mouth daily as needed (constipation).   Unknown   pravastatin (PRAVACHOL) 80 MG tablet Take 80 mg by mouth at bedtime.   11/26/2021   Psyllium (REGULOID) 400 MG CAPS Take 400 mg by mouth 2 (two) times daily.   11/26/2021   saccharomyces boulardii (FLORASTOR) 250 MG capsule Take 1 capsule (250 mg total) by mouth 2 (two) times daily. 60 capsule 0 11/26/2021   simethicone (  MYLICON) 0000000 MG chewable tablet Chew 125 mg by mouth 4 (four) times daily as needed for flatulence.   Past Week    Assessment: 88 y.o. F presents with abd pain and AMS. Pt on Eliquis PTA 2.5mg  po BID - last dose 10/12 pm. To begin heparin gtt until AMS improves and in case procedures needed. Eliquis likely affecting heparin level so will utilize aPTT for monitoring until levels correlate. CBC ok on admission  Goal of Therapy:  Heparin level 0.3-0.7 units/ml aPTT 66-102 seconds Monitor platelets by anticoagulation protocol: Yes   Plan:  Heparin gtt at 850 units/hr Will f/u 8 hr heparin level and aPTT Daily 8 hr heparin level, aPTT, and CBC  Sherlon Handing, PharmD, BCPS Please see amion for complete clinical pharmacist phone list 11/28/2021,4:09 PM  ADDENDUM (1750): Heparin d/c and  restarting home apixaban.  Sherlon Handing, PharmD, BCPS Please see amion for complete clinical pharmacist phone list 11/28/2021 5:51 PM

## 2021-11-28 NOTE — Progress Notes (Signed)
Patient evaluated at bedside. Following commands and answering questions appropriately. Able to tolerate pudding without coughing spells or choking episodes. Will reorder home medications to be given in pudding, discontinue IV heparin. Per daughter patient has responded well to biofreeze, will add to pain regimen and schedule tylenol.

## 2021-11-28 NOTE — ED Notes (Signed)
Pt still c/o pain in abd and back. MD aware. Discussed with daughter about opiods and non- for pain control.

## 2021-11-29 DIAGNOSIS — M25512 Pain in left shoulder: Secondary | ICD-10-CM | POA: Diagnosis present

## 2021-11-29 DIAGNOSIS — E785 Hyperlipidemia, unspecified: Secondary | ICD-10-CM | POA: Diagnosis present

## 2021-11-29 DIAGNOSIS — R29810 Facial weakness: Secondary | ICD-10-CM | POA: Diagnosis present

## 2021-11-29 DIAGNOSIS — K219 Gastro-esophageal reflux disease without esophagitis: Secondary | ICD-10-CM | POA: Diagnosis present

## 2021-11-29 DIAGNOSIS — D7589 Other specified diseases of blood and blood-forming organs: Secondary | ICD-10-CM | POA: Diagnosis present

## 2021-11-29 DIAGNOSIS — Z79890 Hormone replacement therapy (postmenopausal): Secondary | ICD-10-CM | POA: Diagnosis not present

## 2021-11-29 DIAGNOSIS — E039 Hypothyroidism, unspecified: Secondary | ICD-10-CM | POA: Diagnosis present

## 2021-11-29 DIAGNOSIS — M8008XA Age-related osteoporosis with current pathological fracture, vertebra(e), initial encounter for fracture: Secondary | ICD-10-CM | POA: Diagnosis present

## 2021-11-29 DIAGNOSIS — G929 Unspecified toxic encephalopathy: Secondary | ICD-10-CM | POA: Diagnosis present

## 2021-11-29 DIAGNOSIS — E876 Hypokalemia: Secondary | ICD-10-CM | POA: Diagnosis present

## 2021-11-29 DIAGNOSIS — Z79899 Other long term (current) drug therapy: Secondary | ICD-10-CM | POA: Diagnosis not present

## 2021-11-29 DIAGNOSIS — R001 Bradycardia, unspecified: Secondary | ICD-10-CM | POA: Diagnosis not present

## 2021-11-29 DIAGNOSIS — K5730 Diverticulosis of large intestine without perforation or abscess without bleeding: Secondary | ICD-10-CM | POA: Diagnosis present

## 2021-11-29 DIAGNOSIS — Z66 Do not resuscitate: Secondary | ICD-10-CM | POA: Diagnosis present

## 2021-11-29 DIAGNOSIS — I2510 Atherosclerotic heart disease of native coronary artery without angina pectoris: Secondary | ICD-10-CM | POA: Diagnosis present

## 2021-11-29 DIAGNOSIS — Z955 Presence of coronary angioplasty implant and graft: Secondary | ICD-10-CM | POA: Diagnosis not present

## 2021-11-29 DIAGNOSIS — G928 Other toxic encephalopathy: Secondary | ICD-10-CM | POA: Diagnosis present

## 2021-11-29 DIAGNOSIS — Z7901 Long term (current) use of anticoagulants: Secondary | ICD-10-CM | POA: Diagnosis not present

## 2021-11-29 DIAGNOSIS — I10 Essential (primary) hypertension: Secondary | ICD-10-CM | POA: Diagnosis present

## 2021-11-29 DIAGNOSIS — Z881 Allergy status to other antibiotic agents status: Secondary | ICD-10-CM | POA: Diagnosis not present

## 2021-11-29 DIAGNOSIS — Z885 Allergy status to narcotic agent status: Secondary | ICD-10-CM | POA: Diagnosis not present

## 2021-11-29 DIAGNOSIS — G8929 Other chronic pain: Secondary | ICD-10-CM | POA: Diagnosis present

## 2021-11-29 DIAGNOSIS — J9811 Atelectasis: Secondary | ICD-10-CM | POA: Diagnosis present

## 2021-11-29 DIAGNOSIS — I4819 Other persistent atrial fibrillation: Secondary | ICD-10-CM | POA: Diagnosis present

## 2021-11-29 DIAGNOSIS — G629 Polyneuropathy, unspecified: Secondary | ICD-10-CM | POA: Diagnosis present

## 2021-11-29 LAB — BASIC METABOLIC PANEL
Anion gap: 11 (ref 5–15)
BUN: 17 mg/dL (ref 8–23)
CO2: 25 mmol/L (ref 22–32)
Calcium: 9.2 mg/dL (ref 8.9–10.3)
Chloride: 103 mmol/L (ref 98–111)
Creatinine, Ser: 0.82 mg/dL (ref 0.44–1.00)
GFR, Estimated: >60 mL/min (ref >60)
Glucose, Bld: 91 mg/dL (ref 70–99)
Potassium: 3.2 mmol/L — ABNORMAL LOW (ref 3.5–5.1)
Sodium: 139 mmol/L (ref 135–145)

## 2021-11-29 LAB — VITAMIN B12: Vitamin B-12: 537 pg/mL (ref 180–914)

## 2021-11-29 MED ORDER — ROSUVASTATIN CALCIUM 20 MG PO TABS
20.0000 mg | ORAL_TABLET | Freq: Every day | ORAL | Status: DC
  Administered 2021-11-29 – 2021-12-03 (×5): 20 mg via ORAL
  Filled 2021-11-29 (×5): qty 1

## 2021-11-29 MED ORDER — ACETAMINOPHEN 500 MG PO TABS
1000.0000 mg | ORAL_TABLET | Freq: Three times a day (TID) | ORAL | Status: DC
  Administered 2021-11-29 – 2021-12-03 (×11): 1000 mg via ORAL
  Filled 2021-11-29 (×13): qty 2

## 2021-11-29 MED ORDER — SENNOSIDES-DOCUSATE SODIUM 8.6-50 MG PO TABS
1.0000 | ORAL_TABLET | Freq: Every day | ORAL | Status: DC
  Administered 2021-11-29 – 2021-11-30 (×2): 1 via ORAL
  Filled 2021-11-29 (×2): qty 1

## 2021-11-29 MED ORDER — LIDOCAINE 5 % EX PTCH
1.0000 | MEDICATED_PATCH | Freq: Every day | CUTANEOUS | Status: DC
  Administered 2021-11-29 – 2021-11-30 (×2): 1 via TRANSDERMAL
  Filled 2021-11-29 (×5): qty 1

## 2021-11-29 MED ORDER — POTASSIUM CHLORIDE 20 MEQ PO PACK
40.0000 meq | PACK | Freq: Two times a day (BID) | ORAL | Status: AC
  Administered 2021-11-29 (×2): 40 meq via ORAL
  Filled 2021-11-29 (×2): qty 2

## 2021-11-29 NOTE — TOC Initial Note (Signed)
Transition of Care [facility]) - Initial/Assessment Note    Patient Details  Name: Charlotte Powell MRN: [id number] Date of Birth: Sep 08, 1928  Transition of Care [facility]) CM/SW Contact:    Amador Cunas, [place] Phone Number: 11/29/2021, 1:39 PM  Clinical Narrative:   SW spoke with pt's dtr [name] re PT/OT recommendation for SNF. Pt admitted from [facility]. Dtr reports pt with previous SNF stay when the pt lived in [place]. Reviewed SNF placement process and answered questions.   Pt's dtr is hopeful pt will continue to progress with mobility and be able to return to ALF, but is agreeable to SNF for STR if needed. Will begin SNF search and f/u with offers as available.   [employer] auth to be started over weekend.   Wandra Feinstein, MSW, LCSW [phone] (coverage)                  Expected Discharge Plan: [place] Barriers to Discharge: Continued Medical Work up, SNF Pending bed offer, Insurance Authorization   Patient Goals and CMS Choice     Choice offered to / list presented to : Adult Children  Expected Discharge Plan and Services Expected Discharge Plan: [place] Choice: [place] arrangements for the past 2 months: [place]                                      Prior Living Arrangements/Services Living arrangements for the past 2 months: [place] Lives with:: Facility Resident          Need for Family Participation in Patient Care: Yes (Comment) Care giver support system in place?: Yes (comment)   Criminal Activity/Legal Involvement Pertinent to Current Situation/Hospitalization: No - Comment as needed  Activities of Daily Living      Permission Sought/Granted Permission sought to share information with : Facility Art therapist granted to share information with : Yes, Verbal Permission Granted              Emotional  Assessment       Orientation: : Fluctuating Orientation (Suspected and/or reported Sundowners) Alcohol / Substance Use: Not Applicable Psych Involvement: No (comment)  Admission diagnosis:  Toxic encephalopathy [G92.9] Altered mental status, unspecified altered mental status type [R41.82] Patient Active Problem List   Diagnosis Date Noted   Toxic encephalopathy 11/27/2021   PAF (paroxysmal atrial fibrillation) ([place]) 04/22/2021   Chest pain of uncertain etiology 93/71/6967   Coronary artery disease involving native coronary artery of native heart without angina pectoris 04/22/2021   Chronic anticoagulation 04/22/2021   Atrial fibrillation with rapid ventricular response ([place]) 04/14/2021   Left upper arm pain 04/14/2021   Secondary hypercoagulable state ([place]) 03/21/2021   Viral gastroenteritis 02/09/2021   DNR (do not resuscitate)/DNI(Do Not Intubate) 02/09/2021   HLD (hyperlipidemia) 02/09/2021   CAD (coronary artery disease) 02/09/2021   Persistent atrial fibrillation ([place]) 11/16/2020   HTN (hypertension) 11/16/2020   PCP:  Edmonia Caprio, NP Pharmacy:   [facility], [address] [place] [place] [address] Phone: [phone] Fax: [place], [place] [address] Phone: [phone] Fax: [phone]     Social Determinants of Health (SDOH) Interventions    Readmission Risk Interventions  No data to display

## 2021-11-29 NOTE — Progress Notes (Signed)
Physical Therapy Treatment Patient Details Name: Charlotte Powell MRN: [id number] DOB: [birth year] Today's Date: 11/29/2021   History of Present Illness Pt is a 88 y/o female who presented with toxic encephalopathy and progressive left upper quadrant pain. PMH: CAD s/p PCI w/ DES to RCA , a fib, hypothyroidism, GERD, and neuropathy. Admitted 11/27/21.    PT Comments    Patient progressing with mobility and able to stand with assist and take side steps to EOB and to sit up with less assist.  Patient easily fatigued and not able to ambulate forward.  Daughter present and supportive.  Continue to feel she will benefit from STSNF at d/c.  PT will continue to follow acutely.    Recommendations for follow up therapy are one component of a multi-disciplinary discharge planning process, led by the attending physician.  Recommendations may be updated based on patient status, additional functional criteria and insurance authorization.  Follow Up Recommendations  Skilled nursing-short term rehab (<3 hours/day) Can patient physically be transported by private vehicle: No   Assistance Recommended at Discharge Frequent or constant Supervision/Assistance  Patient can return home with the following Assistance with cooking/housework;Assistance with feeding;Direct supervision/assist for financial management;Direct supervision/assist for medications management;Assist for transportation;A lot of help with walking and/or transfers;A lot of help with bathing/dressing/bathroom   Equipment Recommendations  None recommended by PT    Recommendations for Other Services       Precautions / Restrictions Precautions Precautions: Fall Precaution Comments: fragile skin     Mobility  Bed Mobility Overal bed mobility: Needs Assistance Bed Mobility: Rolling, Supine to Sit, Sit to Supine Rolling: Mod assist   Supine to sit: HOB elevated, Mod assist     General bed mobility comments: rolled in bed for hygiene  with NT present to assist; up to EOB with HOB up and assist for trunk upright and scooting hips, assist to supine for legs into bed, repositioning with cues    Transfers Overall transfer level: Needs assistance Equipment used: Rolling walker (2 wheels) Transfers: Sit to/from Stand Sit to Stand: From elevated surface, Min assist           General transfer comment: pushing up from EOB with min A for balance and for transition to RW with UE's    Ambulation/Gait Ambulation/Gait assistance: Min assist Gait Distance (Feet): 2 Feet Assistive device: Rolling walker (2 wheels) Gait Pattern/deviations: Step-to pattern       General Gait Details: patient feeling weak and performed side steps to [facility]   Stairs             Wheelchair Mobility    Modified Rankin (Stroke Patients Only)       Balance Overall balance assessment: Needs assistance   Sitting balance-Leahy Scale: Fair Sitting balance - Comments: sitting with close S for balance and cues for upright posture, due to quickly fatigues with flexed posture with head and neck on EOB   Standing balance support: Reliant on assistive device for balance, Bilateral upper extremity supported Standing balance-Leahy Scale: Poor                              Cognition Arousal/Alertness: Awake/alert   Overall Cognitive Status: Impaired/Different from baseline Area of Impairment: Memory, Problem solving, Following commands                     Memory: Decreased short-term memory Following Commands: Follows one step commands with increased time, Follows one  step commands consistently     Problem Solving: Decreased initiation, Requires verbal cues, Requires tactile cues          Exercises General Exercises - Lower Extremity Ankle Circles/Pumps: AROM, Both, 10 reps, Supine Heel Slides: AROM, Both, 5 reps, Supine    General Comments General comments (skin integrity, edema, etc.): daughter present in the  room and supportive .  Soiled with BM in the bed initially, assist for hygiene and changing bed linen      Pertinent Vitals/Pain Pain Assessment Pain Assessment: Faces Faces Pain Scale: Hurts even more Pain Location: L shoulder Pain Descriptors / Indicators: Grimacing, Guarding Pain Intervention(s): Monitored during session, Limited activity within patient's tolerance, Repositioned    Home Living                          Prior Function            PT Goals (current goals can now be found in the care plan section) Progress towards PT goals: Progressing toward goals    Frequency    Min 3X/week      PT Plan Current plan remains appropriate    Co-evaluation              AM-PAC PT "6 Clicks" Mobility   Outcome Measure  Help needed turning from your back to your side while in a flat bed without using bedrails?: A Lot Help needed moving from lying on your back to sitting on the side of a flat bed without using bedrails?: A Lot Help needed moving to and from a bed to a chair (including a wheelchair)?: Total Help needed standing up from a chair using your arms (e.g., wheelchair or bedside chair)?: A Lot Help needed to walk in hospital room?: Total Help needed climbing 3-5 steps with a railing? : Total 6 Click Score: 9    End of Session Equipment Utilized During Treatment: Gait belt Activity Tolerance: Patient limited by fatigue Patient left: in bed;with call bell/phone within reach;with bed alarm set;with family/visitor present   PT Visit Diagnosis: Muscle weakness (generalized) (M62.81);Difficulty in walking, not elsewhere classified (R26.2);Other symptoms and signs involving the nervous system (R29.898);Pain Pain - Right/Left: Left Pain - part of body: Shoulder     Time: [id number] PT Time Calculation (min) (ACUTE ONLY): 32 min  Charges:  $Therapeutic Activity: 23-37 mins                     Magda Kiel, PT Acute Rehabilitation  Services Office:[phone] 11/29/2021    Reginia Naas 11/29/2021, 12:34 PM

## 2021-11-29 NOTE — Progress Notes (Signed)
HD#0 Subjective:   Summary: Charlotte Powell is a 88 year old female with a past medical history significant for CAD s/p PCI w/ DES to RCA in 2004, hypertension, persistent persistent A-fib on cardizem and eliquis, hypothyroidism on levothyroxine, GERD, ,neuropathy on gabapentin, and acute on chronic back pain from chronic thoracic and vertebral compression fractures who p/w acute toxic encephalopathy in the setting of recent initiation of baclofen.   Overnight Events: NAEO.  Patient feeling much better today. She endorses pain in her left shoulder. She states she is not having pain in her abdomen or back but did have abdominal pain yesterday. Daughter very pleased with improvement. States patient is able to tolerate diet / medications by mouth. She is hoping to avoid going to a skilled nursing facility. States physical therapy has been by and patient is making progress. Patient endorses bowel movement today.  Objective:  Vital signs in last 24 hours: Vitals:   11/28/21 2043 11/28/21 2347 11/29/21 0411 11/29/21 1205  BP: (!) 142/82 (!) 158/72 137/75 130/71  Pulse: 84 85 (!) 101 71  Resp: 18 18 18 17   Temp: 98.1 F (36.7 C) 98.3 F (36.8 C) 97.9 F (36.6 C) 97.7 F (36.5 C)  TempSrc: Oral Oral Oral Oral  SpO2: 94% 94% 94% 96%  Weight:      Height:       Supplemental O2: Room Air SpO2: 96 %   Physical Exam:  Constitutional: well-appearing elderly female lying in hospital bed, in no acute distress Cardiovascular: normal rate, irregular rhythm, no m/r/g Pulmonary/Chest: normal work of breathing on room air Abdominal: soft, non-distended, no tenderness to palpation in all quadrants MSK: normal bulk and tone Neurological: alert & oriented x 3, able to speak in short phrases, follows commands, bilateral grip strength intact Skin: warm and dry Psych: Normal mood and affect  Filed Weights   11/27/21 0913  Weight: 64.4 kg     Intake/Output Summary (Last 24 hours) at  11/29/2021 1453 Last data filed at 11/29/2021 0400 Gross per 24 hour  Intake --  Output 500 ml  Net -500 ml   Net IO Since Admission: 100 mL [11/29/21 1453]  Pertinent Labs:    Latest Ref Rng & Units 11/28/2021    4:42 AM 11/27/2021    9:32 AM 11/27/2021    9:25 AM  CBC  WBC 4.0 - 10.5 K/uL 5.8   6.9   Hemoglobin 12.0 - 15.0 g/dL 13.8  15.6  15.0   Hematocrit 36.0 - 46.0 % 40.2  46.0  43.9   Platelets 150 - 400 K/uL 188   177        Latest Ref Rng & Units 11/29/2021    4:43 AM 11/28/2021    4:42 AM 11/27/2021    9:32 AM  CMP  Glucose 70 - 99 mg/dL 91  131  164   BUN 8 - 23 mg/dL 17  10  8    Creatinine 0.44 - 1.00 mg/dL 0.82  0.86  0.60   Sodium 135 - 145 mmol/L 139  143  142   Potassium 3.5 - 5.1 mmol/L 3.2  3.5  3.5   Chloride 98 - 111 mmol/L 103  104  106   CO2 22 - 32 mmol/L 25  23    Calcium 8.9 - 10.3 mg/dL 9.2  10.2    Total Protein 6.5 - 8.1 g/dL  6.6    Total Bilirubin 0.3 - 1.2 mg/dL  1.6    Alkaline Phos 38 -  126 U/L  78    AST 15 - 41 U/L  15    ALT 0 - 44 U/L  12      Imaging: No results found.  Assessment/Plan:   Principal Problem:   Toxic encephalopathy   Patient Summary: Charlotte Powell is a 88 year old female with a past medical history significant for CAD s/p PCI w/ DES to RCA in 2004, hypertension, persistent persistent A-fib on cardizem and eliquis, hypothyroidism on levothyroxine, GERD, and neuropathy on gabapentin who p/w acute toxic encephalopathy in the setting of recent initiation of baclofen for abdominal pain of unknown etiology.    #Toxic encephalopathy in the setting of recent initiation of baclofen  Encephalopathy resolved. Patient much improved today but not back to baseline mental status per daughter. She is somewhat conversant and feeding herself. We have transitioned to po medications. She is still regaining her strength and requires continued PT/OT, who recommend SNF. Will continue to avoid centrally acting medications,  though will add back patient's low dose of gabapentin nightly for her neuropathy. -Hold baclofen, discontinue this at discharge as patient has not been on this chronically -PT/OT   #Abdominal pain of unclear etiology Compression fractures of T11-L2 which were noted to be chronic on CT A/P 10/01. Pain with palpation of left lower back but not abdomen. Feel this most likely to be radiculopathy given t/l spine compression fractures and corresponding dermatome.  -Lidocaine patches, Biofreeze for pain -Tylenol TID -Miralax daily -home Protonix 40 mg daily  #Atraumatic compression fractures Pt has compression fractures of T11-L2 which were noted to be chronic on CT A/P 10/01. No history of trauma. Pain with palpation of left lower back but not abdomen. Feel this most likely to be radiculopathy given t/l spine compression fractures and corresponding dermatome. This qualifies her for diagnosis of osteoporosis.  -cholecalciferol 1000 units daily   #Persistent atrial fibrillation On eliquis and cardizem at home. Her chads vasc is 5, HAS Bled 2. She is currently rate controlled at home. Rate intermittently controlled but just recently restarted on diltiazem. Will revisit anticoagulation with patient and daughter before discharge. -Restarted diltiazem 60 mg BID -home Eliquis 2.5 mg BID   #CAD s/p PCI w/ RCA  No chest pain.  -crestor 20 daily   #Hypothyroidism TSH 9.8 on admission. Improved to 3.98. -Synthroid 25 mcg daily   Dispo: Admit patient to Observation with expected length of stay less than 2 midnights.  Adron Bene MD Internal Medicine Resident PGY-1 Please contact the on call pager after 5 pm and on weekends at [phone].

## 2021-11-29 NOTE — [place] FL2 (Signed)
[place] MEDICAID FL2 LEVEL OF CARE SCREENING TOOL     IDENTIFICATION  Patient Name: Charlotte Powell Birthdate: 11/21/1928 Sex: female Admission Date (Current Location): 11/27/2021  [facility] and [place] Number:  Herbalist and Address:  [facility] [place], [place] [address], [place], [place] [address]      Provider Number: [id number]  Attending Physician Name and Address:  Angelica Pou, MD  Relative Name and Phone Number:       Current Level of Care: Hospital Recommended Level of Care: [place] Prior Approval Number:    Date Approved/Denied:   PASRR Number: [id number]  Discharge Plan: SNF    Current Diagnoses: Patient Active Problem List   Diagnosis Date Noted   Toxic encephalopathy 11/27/2021   PAF (paroxysmal atrial fibrillation) ([place]) 04/22/2021   Chest pain of uncertain etiology 96/22/2979   Coronary artery disease involving native coronary artery of native heart without angina pectoris 04/22/2021   Chronic anticoagulation 04/22/2021   Atrial fibrillation with rapid ventricular response ([place]) 04/14/2021   Left upper arm pain 04/14/2021   Secondary hypercoagulable state ([place]) 03/21/2021   Viral gastroenteritis 02/09/2021   DNR (do not resuscitate)/DNI(Do Not Intubate) 02/09/2021   HLD (hyperlipidemia) 02/09/2021   CAD (coronary artery disease) 02/09/2021   Persistent atrial fibrillation ([place]) 11/16/2020   HTN (hypertension) 11/16/2020    Orientation RESPIRATION BLADDER Height & Weight     Self, Place  Normal Incontinent Weight: 142 lb (64.4 kg) Height:  5\' 3"  (160 cm)  BEHAVIORAL SYMPTOMS/MOOD NEUROLOGICAL BOWEL NUTRITION STATUS      Incontinent    AMBULATORY STATUS COMMUNICATION OF NEEDS Skin   Extensive Assist Verbally Normal                       Personal Care Assistance Level of Assistance  Bathing, Feeding, Dressing Bathing Assistance: Limited assistance Feeding assistance: Limited  assistance Dressing Assistance: Limited assistance     Functional Limitations Info  Sight, Hearing, Speech Sight Info: Adequate Hearing Info: Adequate Speech Info: Adequate    SPECIAL CARE FACTORS FREQUENCY  PT (By licensed PT), OT (By licensed OT)                    Contractures Contractures Info: Not present    Additional Factors Info                  Current Medications (11/29/2021):  This is the current hospital active medication list Current Facility-Administered Medications  Medication Dose Route Frequency Provider Last Rate Last Admin   acetaminophen (TYLENOL) tablet 1,000 mg  1,000 mg Oral Q6H Katsadouros, Vasilios, MD   1,000 mg at 11/29/21 1213   apixaban (ELIQUIS) tablet 2.5 mg  2.5 mg Oral BID Katsadouros, Vasilios, MD   2.5 mg at 11/29/21 0935   cholecalciferol (VITAMIN D3) 25 MCG (1000 UNIT) tablet 1,000 Units  1,000 Units Oral Daily Katsadouros, Vasilios, MD   1,000 Units at 11/29/21 0935   diltiazem (CARDIZEM SR) 12 hr capsule 60 mg  60 mg Oral BID Katsadouros, Vasilios, MD   60 mg at 89/21/19 4174   folic acid (FOLVITE) tablet 1 mg  1 mg Oral Daily Katsadouros, Vasilios, MD   1 mg at 11/29/21 0935   levothyroxine (SYNTHROID) tablet 25 mcg  25 mcg Oral Daily Katsadouros, Vasilios, MD   25 mcg at 11/29/21 0540   lidocaine (LIDODERM) 5 % 1 patch  1 patch Transdermal Q24H Rick Duff, MD  1 patch at 11/28/21 1821   lidocaine (LIDODERM) 5 % 1 patch  1 patch Transdermal Daily Marolyn Haller, MD   1 patch at 11/29/21 0935   Muscle Rub CREA   Topical BID Belva Agee, MD   Given at 11/29/21 0936   pantoprazole (PROTONIX) EC tablet 40 mg  40 mg Oral Daily Katsadouros, Vasilios, MD   40 mg at 11/29/21 0935   polyethylene glycol (MIRALAX / GLYCOLAX) packet 17 g  17 g Oral Daily Katsadouros, Vasilios, MD   17 g at 11/29/21 0935   potassium chloride (KLOR-CON) packet 40 mEq  40 mEq Oral BID Adron Bene, MD   40 mEq at 11/29/21 0936    pravastatin (PRAVACHOL) tablet 80 mg  80 mg Oral Daily Katsadouros, Vasilios, MD   80 mg at 11/29/21 0935   senna-docusate (Senokot-S) tablet 1 tablet  1 tablet Oral Daily Marolyn Haller, MD   1 tablet at 11/29/21 0935     Discharge Medications: Please see discharge summary for a list of discharge medications.  Relevant Imaging Results:  Relevant Lab Results:   Additional Information SS# [id number]  Deatra Robinson, [place]

## 2021-11-29 NOTE — Progress Notes (Signed)
Speech Language Pathology Treatment: Dysphagia  Patient Details Name: Charlotte Powell MRN: [id number] DOB: 1928/05/09 Today's Date: 11/29/2021 Time: 2952-8413 SLP Time Calculation (min) (ACUTE ONLY): 10 min  Assessment / Plan / Recommendation Clinical Impression  Pt much more alert today as compared to 10/12.  Pt bright and engaged and would like to have a cheeseburger.  Pt accepted trials of thin liquid, puree, and regular solid.  There was delayed cough x1 with graham cracker and pt requested a sip of water.  There was also cough in absence of POs.  Pt took pills whole with thin liquid. Reported feeling that one didn't go down.  This was resolved with liquid wash and additional solid bolus.  Recommend regular texture diet and thin liquid.  Pt has no further ST needs at this time; SLP will sign off.     HPI HPI: Patient is a 88 yo worman who presented via ems from assisted nursing facility for AMS. Pt was reported to have been found minimally responsive this morning with some emesis in route.  Head CT 10/11 with no acute findings.  CXR 10/11: "Bibasilar atelectasis and small left pleural effusion". Pt with pmh of CAD, GERD, hypertension, hyperlipidemia, and hypothyroidism.      SLP Plan  All goals met;Discharge SLP treatment due to (comment)      Recommendations for follow up therapy are one component of a multi-disciplinary discharge planning process, led by the attending physician.  Recommendations may be updated based on patient status, additional functional criteria and insurance authorization.    Recommendations  Diet recommendations: Regular;Thin liquid Medication Administration: Whole meds with puree Supervision: Staff to assist with self feeding Compensations: Slow rate;Small sips/bites Postural Changes and/or Swallow Maneuvers: Seated upright 90 degrees                Oral Care Recommendations: Oral care BID Follow Up Recommendations: No SLP follow up Assistance  recommended at discharge: Intermittent Supervision/Assistance SLP Visit Diagnosis: Dysphagia, unspecified (R13.10) Plan: All goals met;Discharge SLP treatment due to (comment)           Celedonio Savage , [place], [place] Office: [phone] 11/29/2021, 9:49 AM

## 2021-11-29 NOTE — Progress Notes (Signed)
PT/OT recommendation for SNF noted. Pt admitted from [facility]. Voicemail left for pt's dtr requesting return call to discuss dc plan. Also left message for RN at [facility].   Wandra Feinstein, MSW, LCSW [phone] (coverage)

## 2021-11-30 DIAGNOSIS — G929 Unspecified toxic encephalopathy: Secondary | ICD-10-CM | POA: Diagnosis not present

## 2021-11-30 DIAGNOSIS — G8929 Other chronic pain: Secondary | ICD-10-CM

## 2021-11-30 DIAGNOSIS — M8008XA Age-related osteoporosis with current pathological fracture, vertebra(e), initial encounter for fracture: Secondary | ICD-10-CM | POA: Diagnosis not present

## 2021-11-30 MED ORDER — GABAPENTIN 300 MG PO CAPS
300.0000 mg | ORAL_CAPSULE | Freq: Every day | ORAL | Status: DC
  Administered 2021-11-30 – 2021-12-02 (×3): 300 mg via ORAL
  Filled 2021-11-30 (×3): qty 1

## 2021-11-30 NOTE — Progress Notes (Signed)
HD#1 Subjective:   Summary: [name] is a 88 year old female with a past medical history significant for CAD s/p PCI w/ DES to RCA in 2004, hypertension, persistent persistent A-fib on cardizem and eliquis, hypothyroidism on levothyroxine, GERD, ,neuropathy on gabapentin, and acute on chronic back pain from chronic thoracic and vertebral compression fractures who p/w acute toxic encephalopathy in the setting of recent initiation of baclofen.   Overnight Events: NAEO.  Patient continues to feel well.  She states that she has no abdominal pain or back pain.  She also feels like her left shoulder pain is improving.  She does note that she has had some loose stools.  She also states that she is having some numbness and tingling in her feet and would like to resume her gabapentin. PT continues to recommend SNF as of yesterday.   Objective:  Vital signs in last 24 hours: Vitals:   11/30/21 0026 11/30/21 0333 11/30/21 0812 11/30/21 1202  BP: (!) 160/70 (!) 147/87 122/68 131/62  Pulse: 70 91 99 (!) 57  Resp: 20 19 14 14   Temp: 97.7 F (36.5 C) 98.1 F (36.7 C) 98.1 F (36.7 C) 98.3 F (36.8 C)  TempSrc: Oral Oral Oral Oral  SpO2: 97% 97% 97% 95%  Weight:      Height:       Supplemental O2: Room Air SpO2: 95 %   Physical Exam:  Constitutional: well-appearing elderly female lying in hospital bed, in no acute distress Cardiovascular: normal rate, irregular rhythm, no m/r/g Pulmonary/Chest: normal work of breathing on room air Abdominal: soft, non-distended, no tenderness to palpation in all quadrants MSK: normal bulk and tone Neurological: alert & oriented x 3, no focal deficits  Skin: warm and dry Psych: Normal mood and affect  Filed Weights   11/27/21 0913  Weight: 64.4 kg     Intake/Output Summary (Last 24 hours) at 11/30/2021 1410 Last data filed at 11/30/2021 0334 Gross per 24 hour  Intake 200 ml  Output 350 ml  Net -150 ml    Net IO Since Admission:  -50 mL [11/30/21 1410]  Pertinent Labs:    Latest Ref Rng & Units 11/28/2021    4:42 AM 11/27/2021    9:32 AM 11/27/2021    9:25 AM  CBC  WBC 4.0 - 10.5 K/uL 5.8   6.9   Hemoglobin 12.0 - 15.0 g/dL 13.8  15.6  15.0   Hematocrit 36.0 - 46.0 % 40.2  46.0  43.9   Platelets 150 - 400 K/uL 188   177        Latest Ref Rng & Units 11/29/2021    4:43 AM 11/28/2021    4:42 AM 11/27/2021    9:32 AM  CMP  Glucose 70 - 99 mg/dL 91  131  164   BUN 8 - 23 mg/dL 17  10  8    Creatinine 0.44 - 1.00 mg/dL 0.82  0.86  0.60   Sodium 135 - 145 mmol/L 139  143  142   Potassium 3.5 - 5.1 mmol/L 3.2  3.5  3.5   Chloride 98 - 111 mmol/L 103  104  106   CO2 22 - 32 mmol/L 25  23    Calcium 8.9 - 10.3 mg/dL 9.2  10.2    Total Protein 6.5 - 8.1 g/dL  6.6    Total Bilirubin 0.3 - 1.2 mg/dL  1.6    Alkaline Phos 38 - 126 U/L  78    AST 15 -  41 U/L  15    ALT 0 - 44 U/L  12      Imaging: No results found.  Assessment/Plan:   Principal Problem:   Toxic encephalopathy   Patient Summary: [name] is a 88 year old female with a past medical history significant for CAD s/p PCI w/ DES to RCA in 2004, hypertension, persistent persistent A-fib on cardizem and eliquis, hypothyroidism on levothyroxine, GERD, and neuropathy on gabapentin who p/w acute toxic encephalopathy in the setting of recent initiation of baclofen for abdominal pain of unknown etiology.    #Toxic encephalopathy in the setting of recent initiation of baclofen  Encephalopathy resolved. Will continue to hold patients baclofen, will resume her gabapentin this PM.  -PT/OT   #Abdominal pain of unclear etiology Resolved with biofreeze and lidocaine. Likely in the setting of Compression fractures of T11-L2 which were noted to be chronic on CT A/P 10/01. However, CT A/P this hospitalization noted possible progression of T11 fracture.  -Continue Tylenol TID,  -home Protonix 40 mg daily  #Atraumatic compression fractures Pt has  compression fractures of T11-L2 which were noted to be chronic on CT A/P 10/01. No history of trauma. Pain with palpation of left lower back but not abdomen. Feel this most likely to be radiculopathy given t/l spine compression fractures and corresponding dermatome. This qualifies her for diagnosis of osteoporosis.  -cholecalciferol 1000 units daily   #Persistent atrial fibrillation On eliquis and cardizem at home. Her chads vasc is 5, HAS Bled 2. She is currently rate controlled at home. Rate intermittently controlled but just recently restarted on diltiazem. Will revisit anticoagulation with patient and daughter before discharge. -Continue diltiazem 60 mg BID -home Eliquis 2.5 mg BID   #CAD s/p PCI w/ RCA  No chest pain.  -crestor 20 daily   #Hypothyroidism TSH 9.8 on admission.  -Continue Synthroid 25 mcg daily   Dispo: Admit patient to Observation with expected length of stay less than 2 midnights.  Marolyn Haller, MD PGY-3 Internal Medicine  Pager [phone]

## 2021-11-30 NOTE — Progress Notes (Signed)
Spoke to [place] with [facility] [phone] who reports they are likely able to accommodate pt at current functional status but will want to review again on Monday. Pt is active with [facility] for HHPT/OT services per [place].   Pt has been worked up for SNF but dtr prefers return to ALF if possible. Will submit for [employer] auth tomorrow.   Wandra Feinstein, MSW, LCSW [phone] (coverage)

## 2021-12-01 NOTE — Progress Notes (Signed)
[employer] auth submitted (ref# [id number]) for SNF in the event pt unable to return to ALF at current level of function. SW will f/u with Chrys Racer at [facility] [phone]) Monday to determine dc plan. Pt is active with [facility] for [facility] services.   Wandra Feinstein, MSW, LCSW [phone] (coverage)

## 2021-12-01 NOTE — Progress Notes (Addendum)
HD#2 Subjective:   Summary: [name] is a 88 year old female with a past medical history significant for CAD s/p PCI w/ DES to RCA in 2004, hypertension, persistent persistent A-fib on cardizem and eliquis, hypothyroidism on levothyroxine, GERD, ,neuropathy on gabapentin, and acute on chronic back pain from chronic thoracic and vertebral compression fractures who p/w acute toxic encephalopathy in the setting of recent initiation of baclofen.   Overnight Events: NAEO.  Patient denies any pain today, including at her abdomen or lower back. Says pain in left shoulder is much better and wants to make sure she doesn't get a lidocaine patch in addition to her Biofreeze. Endorses good nutrition, urinating and passing bowel movements like normal. She is hopeful to make more progress with PT today and is eager to get back to her baseline of being able to walk down to meals at her ALF. States her gabapentin helped quite a bit with her foot neuropathy last night.  Objective:  Vital signs in last 24 hours: Vitals:   12/01/21 0038 12/01/21 0359 12/01/21 0829 12/01/21 1202  BP: (!) 134/91 131/72 131/70 (!) 125/55  Pulse: 98 (!) 105 94 64  Resp: (!) 21 16 18 19   Temp: 98.2 F (36.8 C) (!) 97.3 F (36.3 C) 98.7 F (37.1 C) 97.7 F (36.5 C)  TempSrc: Oral Oral Oral Oral  SpO2: 95% 94% 95% 98%  Weight:      Height:       Supplemental O2: Room Air SpO2: 98 %   Physical Exam:  Constitutional: well-appearing elderly female lying in hospital bed, in no acute distress Cardiovascular: regular rate, irregular rhythm, no m/r/g Pulmonary/Chest: normal work of breathing on room air MSK: normal bulk and tone Neurological: alert & oriented x 3, no focal deficits  Skin: warm and dry, bruising over bilateral wrists and forearms but nontender Psych: Normal mood and affect  Filed Weights   11/27/21 0913  Weight: 64.4 kg     Intake/Output Summary (Last 24 hours) at 12/01/2021 1344 Last  data filed at 12/01/2021 1202 Gross per 24 hour  Intake 580 ml  Output 700 ml  Net -120 ml   Net IO Since Admission: 310 mL [12/01/21 1344]  Pertinent Labs:    Latest Ref Rng & Units 11/28/2021    4:42 AM 11/27/2021    9:32 AM 11/27/2021    9:25 AM  CBC  WBC 4.0 - 10.5 K/uL 5.8   6.9   Hemoglobin 12.0 - 15.0 g/dL 13.8  15.6  15.0   Hematocrit 36.0 - 46.0 % 40.2  46.0  43.9   Platelets 150 - 400 K/uL 188   177        Latest Ref Rng & Units 11/29/2021    4:43 AM 11/28/2021    4:42 AM 11/27/2021    9:32 AM  CMP  Glucose 70 - 99 mg/dL 91  131  164   BUN 8 - 23 mg/dL 17  10  8    Creatinine 0.44 - 1.00 mg/dL 0.82  0.86  0.60   Sodium 135 - 145 mmol/L 139  143  142   Potassium 3.5 - 5.1 mmol/L 3.2  3.5  3.5   Chloride 98 - 111 mmol/L 103  104  106   CO2 22 - 32 mmol/L 25  23    Calcium 8.9 - 10.3 mg/dL 9.2  10.2    Total Protein 6.5 - 8.1 g/dL  6.6    Total Bilirubin 0.3 - 1.2 mg/dL  1.6    Alkaline Phos 38 - 126 U/L  78    AST 15 - 41 U/L  15    ALT 0 - 44 U/L  12      Imaging: No results found.  Assessment/Plan:   Principal Problem:   Medication adverse effect Active Problems:   Acute exacerbation of chronic low back pain   Chronic left shoulder pain   Obtundation   Patient Summary: [name] is a 89 year old female with a past medical history significant for CAD s/p PCI w/ DES to RCA in 2004, hypertension, persistent persistent A-fib on cardizem and eliquis, hypothyroidism on levothyroxine, GERD, and neuropathy on gabapentin who p/w acute toxic encephalopathy in the setting of recent initiation of baclofen for abdominal pain of unknown etiology.    #Medication induced hypoactive encephalopathy due to baclofen, resolved Encephalopathy resolved. She should not receive baclofen in the future.  -PT/OT   #Abdominal pain of unclear etiology, resolved -Continue Tylenol TID scheduled  -home Protonix 40 mg daily  #Atraumatic compression fractures due to  osteoporosis Pt has compression fractures of T11-L2 which were noted to be chronic on CT A/P 10/01. No history of trauma. Denies pain today.  -cholecalciferol 1000 units daily   #Persistent atrial fibrillation On eliquis and cardizem at home. Her chads vasc is 5, HAS Bled 2. She is currently rate controlled at home. Rate controlled this morning but will continue to monitor. Will revisit decision for anticoagulation with patient and daughter before discharge. -Continue diltiazem 60 mg BID -home Eliquis 2.5 mg BID  # Foot pain due to unspecified neuropathy Improved with re initiation of her home gabapentin   #CAD s/p PCI w/ RCA  -crestor 20 daily   #Hypothyroidism -Continue Synthroid 25 mcg daily   Dispo: Admit patient to Observation with expected length of stay less than 2 midnights.  Marolyn Haller, MD PGY-3 Internal Medicine  Pager [phone]

## 2021-12-02 MED ORDER — POTASSIUM CHLORIDE CRYS ER 20 MEQ PO TBCR
40.0000 meq | EXTENDED_RELEASE_TABLET | Freq: Once | ORAL | Status: AC
  Administered 2021-12-02: 40 meq via ORAL
  Filled 2021-12-02: qty 2

## 2021-12-02 MED ORDER — DILTIAZEM HCL 30 MG PO TABS
30.0000 mg | ORAL_TABLET | Freq: Three times a day (TID) | ORAL | Status: DC
  Administered 2021-12-02 – 2021-12-03 (×3): 30 mg via ORAL
  Filled 2021-12-02 (×3): qty 1

## 2021-12-02 MED ORDER — ROSUVASTATIN CALCIUM 20 MG PO TABS: 20.0000 mg | ORAL_TABLET | Freq: Every day | ORAL | Status: DC

## 2021-12-02 MED ORDER — POTASSIUM CHLORIDE CRYS ER 20 MEQ PO TBCR
20.0000 meq | EXTENDED_RELEASE_TABLET | Freq: Once | ORAL | Status: AC
  Administered 2021-12-02: 20 meq via ORAL
  Filled 2021-12-02: qty 1

## 2021-12-02 MED ORDER — DILTIAZEM HCL 30 MG PO TABS: 30.0000 mg | ORAL_TABLET | Freq: Three times a day (TID) | ORAL | 5 refills | Status: DC

## 2021-12-02 NOTE — Discharge Instructions (Addendum)
Please STOP baclofen as this cause you to be confused.   We will also decrease your cardizem  (diltiazem) to 90mg  total from 120mg  total. Please discuss this with your cardiologist when you follow up.

## 2021-12-02 NOTE — Progress Notes (Signed)
Physical Therapy Treatment Patient Details Name: Charlotte Powell MRN: [id number] DOB: [birth year] Today's Date: 12/02/2021   History of Present Illness Pt is a 88 y/o female who presented with toxic encephalopathy and progressive left upper quadrant pain. PMH: CAD s/p PCI w/ DES to RCA , a fib, hypothyroidism, GERD, and neuropathy. Admitted 11/27/21.    PT Comments    PT contacted by LSW informing PT that the pt's cognition has significantly improved and the dtr as well as the patient would like to return to her ALF. Pt with noted improved cognition however required max encouragement to participate in therapy. Pt was motivated by the thought of returning to her apartment. Pt requiring min/modA for standing and minA for ambulation with use of RW. Per pt and chart pt was ambulating to the dinning hall with RW. At this time pt unable to do so however pt reports "They'll just bring it to my room.", when asked how else she would get her meals. Pt also reports she waits for ALF staff to come get her up in the AM out of bed, to the bathroom, and dressed. ALF staff also assists pt with bathing. If this is all accurate and this level of assist can be provided pt could return to ALF apartment and benefit from HHPT to improved ambulation stability and endurance to progress towards ambulation to dinning hall. Pt would need assist majority of time for safe transition back to ALF as pt at significant fall risk. Acute PT to cont to follow.   Recommendations for follow up therapy are one component of a multi-disciplinary discharge planning process, led by the attending physician.  Recommendations may be updated based on patient status, additional functional criteria and insurance authorization.  Follow Up Recommendations  Home health PT Can patient physically be transported by private vehicle: No   Assistance Recommended at Discharge Frequent or constant Supervision/Assistance  Patient can return home with the  following Assistance with cooking/housework;Assistance with feeding;Direct supervision/assist for financial management;Direct supervision/assist for medications management;Assist for transportation;A lot of help with walking and/or transfers;A lot of help with bathing/dressing/bathroom   Equipment Recommendations  None recommended by PT    Recommendations for Other Services       Precautions / Restrictions Precautions Precautions: Fall Precaution Comments: fragile skin Restrictions Weight Bearing Restrictions: No     Mobility  Bed Mobility Overal bed mobility: Needs Assistance Bed Mobility: Supine to Sit     Supine to sit: Min assist     General bed mobility comments: min A to come into sitting, assisting at the trunk to come into full upright position, patient able to scoot until feet were on ground with increased time    Transfers Overall transfer level: Needs assistance Equipment used: Rolling walker (2 wheels) Transfers: Sit to/from Stand Sit to Stand: Min assist, Mod assist           General transfer comment: cues to use momentum to come into standing and hand positioning, limited use of L UE due to onset of pain with weightbearing, modA to power up from EOB, minA from chair as pt had arm rests to push up from    Ambulation/Gait Ambulation/Gait assistance: Min assist, +2 safety/equipment Gait Distance (Feet): 10 Feet (x1, 30x1) Assistive device: Rolling walker (2 wheels) Gait Pattern/deviations: Step-to pattern Gait velocity: dec Gait velocity interpretation: <1.31 ft/sec, indicative of household ambulator   General Gait Details: pt requiring max encouragement to participate in ambulation. Pt initially with L LE weakness reporting "I have  this bad leg that likes to give way on me." Pt reports falls about a year ago. Pt with improved stability, effort, speed and tolerance with second bout of ambulation   Stairs             Wheelchair Mobility     Modified Rankin (Stroke Patients Only)       Balance Overall balance assessment: Needs assistance Sitting-balance support: No upper extremity supported, Feet supported Sitting balance-Leahy Scale: Fair Sitting balance - Comments: good sitting balance initially, R lateral lean with increased time sitting EOB, citing dizziness Postural control: Posterior lean Standing balance support: Reliant on assistive device for balance, Bilateral upper extremity supported Standing balance-Leahy Scale: Poor Standing balance comment: reliant on RW                            Cognition Arousal/Alertness: Awake/alert Behavior During Therapy: WFL for tasks assessed/performed Overall Cognitive Status: Within Functional Limits for tasks assessed                                 General Comments: pt appears back to baseline from cognition stand point. Pt with decreased desire/effort to particiapte in therapy with self limiting actions howere was motivated by the thought of returning to her apartment.        Exercises      General Comments General comments (skin integrity, edema, etc.): pt with fragile skin, lots of bruising, BP stable, 114/42 with HOB at 37deg and 124/52 s/p AMB      Pertinent Vitals/Pain Pain Assessment Pain Assessment: Faces Faces Pain Scale: Hurts little more Pain Location: L UE, very bruised, edematous, limited WBing Pain Descriptors / Indicators: Grimacing, Guarding    Home Living                          Prior Function            PT Goals (current goals can now be found in the care plan section) Acute Rehab PT Goals Patient Stated Goal: None stated PT Goal Formulation: With patient/family Time For Goal Achievement: 12/05/21 Potential to Achieve Goals: Fair Progress towards PT goals: Progressing toward goals    Frequency    Min 3X/week      PT Plan Current plan remains appropriate    Co-evaluation               AM-PAC PT "6 Clicks" Mobility   Outcome Measure  Help needed turning from your back to your side while in a flat bed without using bedrails?: A Little Help needed moving from lying on your back to sitting on the side of a flat bed without using bedrails?: A Lot Help needed moving to and from a bed to a chair (including a wheelchair)?: A Lot Help needed standing up from a chair using your arms (e.g., wheelchair or bedside chair)?: A Lot Help needed to walk in hospital room?: A Little Help needed climbing 3-5 steps with a railing? : A Lot 6 Click Score: 14    End of Session Equipment Utilized During Treatment: Gait belt Activity Tolerance: Patient limited by fatigue Patient left: with call bell/phone within reach;in chair;with chair alarm set Nurse Communication: Mobility status PT Visit Diagnosis: Muscle weakness (generalized) (M62.81);Difficulty in walking, not elsewhere classified (R26.2);Other symptoms and signs involving the nervous system (R29.898);Pain Pain - Right/Left: Left  Pain - part of body: Shoulder     Time: [id number] PT Time Calculation (min) (ACUTE ONLY): 21 min  Charges:  $Gait Training: 8-22 mins                     Kittie Plater, PT, DPT Acute Rehabilitation Services Secure chat preferred Office #: [phone]    Berline Lopes 12/02/2021, 1:28 PM

## 2021-12-02 NOTE — TOC Progression Note (Signed)
Transition of Care (TOC) - Progression Note    Patient Details  Name: Charlotte Powell MRN: [id number] Date of Birth: 12/05/1928  Transition of Care (TOC) CM/SW Contact   M , LCSW Phone Number: 12/02/2021, 3:27 PM  Clinical Narrative:   CSW updated by PT and OT on patient progress for the day today. CSW updated [facility], discussed patient abilities today and confirmed that [facility] can assist patient as she is able. If patient is unable to ambulate long distances, they are able to get her into her wheelchair as needed to get around. CSW met with patient and daughter to provide update, confirm they still want to discharge back to ALF. Patient not ready today, plan to return to ALF tomorrow. CSW to follow.    Expected Discharge Plan: Assisted Living Barriers to Discharge: Continued Medical Work up  Expected Discharge Plan and Services Expected Discharge Plan: Assisted Living     Post Acute Care Choice: Skilled Nursing Facility Living arrangements for the past 2 months: Assisted Living Facility                                       Social Determinants of Health (SDOH) Interventions    Readmission Risk Interventions     No data to display          

## 2021-12-02 NOTE — Progress Notes (Signed)
HD#3 Subjective:   Summary: Ms. Saia Derossett is a 88 year old female with a past medical history significant for CAD s/p PCI w/ DES to RCA in 2004, hypertension, persistent persistent A-fib on cardizem and eliquis, hypothyroidism on levothyroxine, GERD, ,neuropathy on gabapentin, and acute on chronic back pain from chronic thoracic and vertebral compression fractures who p/w acute toxic encephalopathy in the setting of recent initiation of baclofen.   Overnight Events: NAEO.  Patient denies any pain today, including at her abdomen or lower back and shoulder. Her gabapentin continues to help with her neuropathic foot pain.   Objective:  Vital signs in last 24 hours: Vitals:   12/01/21 2336 12/02/21 0404 12/02/21 0736 12/02/21 1202  BP: 139/79 130/75 (!) 107/52 (!) 124/56  Pulse: (!) 45 (!) 59 (!) 52 (!) 52  Resp: 17 17 16 18   Temp: 97.7 F (36.5 C) (!) 97.5 F (36.4 C) 97.9 F (36.6 C) (!) 97.5 F (36.4 C)  TempSrc: Oral Oral Oral Oral  SpO2: 96% 95% 97% 97%  Weight:      Height:       Supplemental O2: Room Air SpO2: 97 %   Physical Exam:  Constitutional: well-appearing elderly female lying in hospital bed, in no acute distress Cardiovascular: bradycardic rate, irregular rhythm, no m/r/g Pulmonary/Chest: CTAB, normal work of breathing  MSK: normal bulk and tone Neurological: alert & oriented x 3, no focal deficits  Skin: warm and dry, bruising over bilateral wrists and forearms but nontender Psych: Normal mood and affect  Filed Weights   11/27/21 0913  Weight: 64.4 kg     Intake/Output Summary (Last 24 hours) at 12/02/2021 1526 Last data filed at 12/02/2021 0930 Gross per 24 hour  Intake 240 ml  Output 1150 ml  Net -910 ml    Net IO Since Admission: -600 mL [12/02/21 1526]  Pertinent Labs:    Latest Ref Rng & Units 11/28/2021    4:42 AM 11/27/2021    9:32 AM 11/27/2021    9:25 AM  CBC  WBC 4.0 - 10.5 K/uL 5.8   6.9   Hemoglobin 12.0 - 15.0 g/dL  13.8  15.6  15.0   Hematocrit 36.0 - 46.0 % 40.2  46.0  43.9   Platelets 150 - 400 K/uL 188   177        Latest Ref Rng & Units 11/29/2021    4:43 AM 11/28/2021    4:42 AM 11/27/2021    9:32 AM  CMP  Glucose 70 - 99 mg/dL 91  131  164   BUN 8 - 23 mg/dL 17  10  8    Creatinine 0.44 - 1.00 mg/dL 0.82  0.86  0.60   Sodium 135 - 145 mmol/L 139  143  142   Potassium 3.5 - 5.1 mmol/L 3.2  3.5  3.5   Chloride 98 - 111 mmol/L 103  104  106   CO2 22 - 32 mmol/L 25  23    Calcium 8.9 - 10.3 mg/dL 9.2  10.2    Total Protein 6.5 - 8.1 g/dL  6.6    Total Bilirubin 0.3 - 1.2 mg/dL  1.6    Alkaline Phos 38 - 126 U/L  78    AST 15 - 41 U/L  15    ALT 0 - 44 U/L  12      Imaging: No results found.  Assessment/Plan:   Principal Problem:   Medication adverse effect Active Problems:   Acute exacerbation of chronic  low back pain   Chronic left shoulder pain   Obtundation   Patient Summary: Ms. Hadia Minier is a 88 year old female with a past medical history significant for CAD s/p PCI w/ DES to RCA in 2004, hypertension, persistent persistent A-fib on cardizem and eliquis, hypothyroidism on levothyroxine, GERD, and neuropathy on gabapentin who p/w acute toxic encephalopathy in the setting of recent initiation of baclofen for abdominal pain of unknown etiology.    #Medication induced hypoactive encephalopathy due to baclofen, resolved Encephalopathy resolved. She should not receive baclofen in the future.  -PT/OT   #Abdominal pain of unclear etiology, resolved -Continue Tylenol TID scheduled  -home Protonix 40 mg daily  #Atraumatic compression fractures due to osteoporosis Pt has compression fractures of T11-L2 which were noted to be chronic on CT A/P 10/01. No history of trauma. Denies pain today.  -cholecalciferol 1000 units daily   #Persistent atrial fibrillation On eliquis and cardizem at home. Her chads vasc is 5, HAS Bled 2. She is currently rate controlled at home. Rate  controlled this morning has had some bradycardic rates. Patient will need to discuss with her cardiologist along with her daughter the decision to continue anticoagulation given her recent fall.  -Patient is currently on SR cardizem 60mg  BID, will decrease her cardizem to 90mg  total, will have to convert her to cardizem 30mg  TID  -home Eliquis 2.5 mg BID  # Foot pain due to unspecified neuropathy Improved with re initiation of her home gabapentin. Continue home gabapentin    #CAD s/p PCI w/ RCA  -crestor 20 daily   #Hypothyroidism -Continue Synthroid 25 mcg daily   Dispo: Admit patient to Observation with expected length of stay less than 2 midnights.  , MD PGY-3 Internal Medicine  Pager [phone]

## 2021-12-02 NOTE — Discharge Summary (Signed)
Name: Charlotte Powell MRN: [id number] DOB: 11/30/1928 88 y.o. PCP: Edmonia Caprio, NP  Date of Admission: 11/27/2021  9:20 AM Date of Discharge: 12/03/2021 Attending Physician: Angelica Pou, MD  Discharge Diagnosis: 1. Principal Problem:   Medication adverse effect Active Problems:   Acute exacerbation of chronic low back pain   Chronic left shoulder pain   Obtundation  Discharge Medications: Allergies as of 12/03/2021       Reactions   Baclofen Other (See Comments)   hypoactive encephalopathy due to baclofen   Amoxicillin Other (See Comments)   unknown   Hydrocodone    Confusion and AMS        Medication List     STOP taking these medications    baclofen 10 MG tablet Commonly known as: LIORESAL   diltiazem 60 MG 12 hr capsule Commonly known as: CARDIZEM SR Replaced by: diltiazem 30 MG tablet   pravastatin 80 MG tablet Commonly known as: PRAVACHOL       TAKE these medications    acetaminophen 500 MG tablet Commonly known as: TYLENOL Take 2 tablets (1,000 mg total) by mouth 3 (three) times daily. What changed:  when to take this Another medication with the same name was removed. Continue taking this medication, and follow the directions you see here.   Cholecalciferol 25 MCG (1000 UT) capsule Take 1,000 Units by mouth daily.   diltiazem 30 MG tablet Commonly known as: Cardizem Take 1 tablet (30 mg total) by mouth 3 (three) times daily. Replaces: diltiazem 60 MG 12 hr capsule   Eliquis 2.5 MG Tabs tablet Generic drug: apixaban Take 1 tablet (2.5 mg total) by mouth 2 (two) times daily.   fluticasone 50 MCG/ACT nasal spray Commonly known as: FLONASE Place 1 spray into both nostrils daily.   folic acid Q000111Q MCG tablet Commonly known as: FOLVITE Take 800 mcg by mouth daily.   gabapentin 300 MG capsule Commonly known as: NEURONTIN Take 300 mg by mouth at bedtime.   levothyroxine 25 MCG tablet Commonly known as:  SYNTHROID Take 25 mcg by mouth daily.   lidocaine 5 % ointment Commonly known as: XYLOCAINE Apply 1 Application topically 2 (two) times daily. To lower back What changed: Another medication with the same name was changed. Make sure you understand how and when to take each.   lidocaine 5 % Commonly known as: LIDODERM Place 1 patch onto the skin daily. Remove & Discard patch within 12 hours or as directed by MD What changed: additional instructions   melatonin 5 MG Tabs Take 5 mg by mouth at bedtime.   nitroGLYCERIN 0.4 MG SL tablet Commonly known as: NITROSTAT Place 0.4 mg under the tongue every 5 (five) minutes as needed for chest pain.   omeprazole 40 MG capsule Commonly known as: PRILOSEC Take 20 mg by mouth daily.   Polyethylene Glycol 3350 Powd Take 1 Capful by mouth daily as needed (constipation).   Reguloid 400 MG Caps Generic drug: Psyllium Take 400 mg by mouth 2 (two) times daily.   rosuvastatin 20 MG tablet Commonly known as: CRESTOR Take 1 tablet (20 mg total) by mouth daily.   saccharomyces boulardii 250 MG capsule Commonly known as: FLORASTOR Take 1 capsule (250 mg total) by mouth 2 (two) times daily.   simethicone 125 MG chewable tablet Commonly known as: MYLICON Chew 0000000 mg by mouth 4 (four) times daily as needed for flatulence.        Disposition and follow-up:   [name] was  discharged from [facility] in Good condition.  At the hospital follow up visit please address:  1.  Medication induced hypoactive encephalopathy due to baclofen, resolved: Encephalopathy resolved. She should not receive baclofen in the future.    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 Seems to cause radiculopathy to LUQ. Pain controlled with scheduled Tylenol 1000 mg TID, Lidocaine patches, and Biofreeze. Discharged with cholecalciferol 1000 units  daily. F/u to ensure no return of symptoms.   Persistent atrial fibrillation: Will be discharged with eliquis 2.5 mg twice daily and cardizem 30 mg three times daily (reduced from previous home dose due to borderline low HR). She has a close relationship with her cardiologist who can continue discussion of risks and benefits of anticoagulation and statin therapies. Last saw cardiology 10/5.   Hypokalemia: F/u with BMP and supplement as needed.   2.  Labs / imaging needed at time of follow-up: BMP  3.  Pending labs/ test needing follow-up: none  Follow-up Appointments:   Hospital Course by problem list: 1. Hypoactive encephalopathy: Patient presented after being found unarousable morning of admission.  She had recently begun baclofen for abdominal pain, though unsure of dose or frequency.  Patient was transported to the emergency department for further evaluation.  Baclofen discontinued.  Head CT without acute changes.  Received IV fluids and mental status improved.  Initially n.p.o. but advance diet without difficulty.  Patient's mental status returned to baseline and she is slowly but steadily regaining her strength.  She should not receive baclofen again.  Compression fractures causing radiculopathy: Patient presented with left upper quadrant pain.  She did not have any associated nausea, vomiting, diarrhea, constipation, hematuria to emesis, melena, hematochezia, trauma. CTAP was negative for intra-abdominal pathology.  She was noted on 10/1 to have compression fractures of T11-L2 which were noted to be chronic.  Imaging on 10/11 demonstrated deformity at T11 to have been slightly progressed from imaging on 9/30 2022.  Her pain responded well to lidocaine patches and scheduled Tylenol.  Atrial Fibrillation: Patient was continued on her home Eliquis and Cardizem.  Her rate was borderline low so changed Cardizem from 60 mg twice daily to 30 mg 3 times daily and patient has tolerated  well.  Discharge Exam:   BP 134/62 (BP Location: Left Arm)   Pulse (!) 55   Temp 97.6 F (36.4 C) (Oral)   Resp 18   Ht 5\' 3"  (1.6 m)   Wt 64.4 kg   SpO2 98%   BMI 25.15 kg/m  Discharge exam: Physical Exam Constitutional:      General: She is not in acute distress. HENT:     Head: Normocephalic and atraumatic.  Eyes:     Extraocular Movements: Extraocular movements intact.  Pulmonary:     Effort: Pulmonary effort is normal.  Abdominal:     Tenderness: There is no abdominal tenderness.  Skin:    General: Skin is warm and dry.  Neurological:     General: No focal deficit present.     Mental Status: She is alert and oriented to person, place, and time.  Psychiatric:        Mood and Affect: Mood normal.        Behavior: Behavior normal.      Pertinent Labs, Studies, and Procedures:     Latest Ref Rng & Units 11/28/2021    4:42 AM 11/27/2021  9:32 AM 11/27/2021    9:25 AM  CBC  WBC 4.0 - 10.5 K/uL 5.8   6.9   Hemoglobin 12.0 - 15.0 g/dL 13.8  15.6  15.0   Hematocrit 36.0 - 46.0 % 40.2  46.0  43.9   Platelets 150 - 400 K/uL 188   177       Latest Ref Rng & Units 11/29/2021    4:43 AM 11/28/2021    4:42 AM 11/27/2021    9:32 AM  BMP  Glucose 70 - 99 mg/dL 91  131  164   BUN 8 - 23 mg/dL 17  10  8    Creatinine 0.44 - 1.00 mg/dL 0.82  0.86  0.60   Sodium 135 - 145 mmol/L 139  143  142   Potassium 3.5 - 5.1 mmol/L 3.2  3.5  3.5   Chloride 98 - 111 mmol/L 103  104  106   CO2 22 - 32 mmol/L 25  23    Calcium 8.9 - 10.3 mg/dL 9.2  10.2     CT HEAD WO CONTRAST  Result Date: 11/27/2021 CLINICAL DATA:  Altered mental status EXAM: CT HEAD WITHOUT CONTRAST CT CERVICAL SPINE WITHOUT CONTRAST TECHNIQUE: Multidetector CT imaging of the head and cervical spine was performed following the standard protocol without intravenous contrast. Multiplanar CT image reconstructions of the cervical spine were also generated. RADIATION DOSE REDUCTION: This exam was performed  according to the departmental dose-optimization program which includes automated exposure control, adjustment of the mA and/or kV according to patient size and/or use of iterative reconstruction technique. COMPARISON:  CT/CTA head and neck 11/27/2020, CT head and cervical spine 07/08/2021 FINDINGS: CT HEAD FINDINGS Brain: There is no acute intracranial hemorrhage, extra-axial fluid collection, or acute infarct There is mild background parenchymal volume loss with prominence of the ventricular system and extra-axial CSF spaces. Patchy hypodensity in the supratentorial Charlotte Powell matter likely reflects sequela of underlying chronic small vessel ischemic change. There is no mass lesion.  There is no mass effect or midline shift. Vascular: There is calcification of the bilateral carotid siphons. Skull: Normal. Negative for fracture or focal lesion. Sinuses/Orbits: The paranasal sinuses are clear. Bilateral lens implants are in place. The globes and orbits are otherwise unremarkable. Other: None. CT CERVICAL SPINE FINDINGS Alignment: There is trace anterolisthesis of C6 on C7 and C7 on T1, unchanged and likely degenerative in nature. There is no jumped or perched facet or other evidence of traumatic malalignment. Skull base and vertebrae: Skull base alignment is maintained. Vertebral body heights are preserved. There is no evidence of acute fracture. There is no suspicious osseous lesion. Soft tissues and spinal canal: Choose Disc levels: There is multilevel disc space narrowing and degenerative endplate change, most advanced at C5-C6 and C6-C7. Is multilevel bulky facet arthropathy, most advanced at C3-C4 and C4-C5 on the right. Upper chest: The imaged lung apices are clear. Other: None. IMPRESSION: 1. No acute intracranial pathology. 2. No acute fracture or traumatic malalignment of the cervical spine. Electronically Signed   By: Valetta Mole M.D.   On: 11/27/2021 11:38   CT Cervical Spine Wo Contrast  Result Date:  11/27/2021 CLINICAL DATA:  Altered mental status EXAM: CT HEAD WITHOUT CONTRAST CT CERVICAL SPINE WITHOUT CONTRAST TECHNIQUE: Multidetector CT imaging of the head and cervical spine was performed following the standard protocol without intravenous contrast. Multiplanar CT image reconstructions of the cervical spine were also generated. RADIATION DOSE REDUCTION: This exam was performed according to the departmental dose-optimization program  which includes automated exposure control, adjustment of the mA and/or kV according to patient size and/or use of iterative reconstruction technique. COMPARISON:  CT/CTA head and neck 11/27/2020, CT head and cervical spine 07/08/2021 FINDINGS: CT HEAD FINDINGS Brain: There is no acute intracranial hemorrhage, extra-axial fluid collection, or acute infarct There is mild background parenchymal volume loss with prominence of the ventricular system and extra-axial CSF spaces. Patchy hypodensity in the supratentorial Charlotte Powell matter likely reflects sequela of underlying chronic small vessel ischemic change. There is no mass lesion.  There is no mass effect or midline shift. Vascular: There is calcification of the bilateral carotid siphons. Skull: Normal. Negative for fracture or focal lesion. Sinuses/Orbits: The paranasal sinuses are clear. Bilateral lens implants are in place. The globes and orbits are otherwise unremarkable. Other: None. CT CERVICAL SPINE FINDINGS Alignment: There is trace anterolisthesis of C6 on C7 and C7 on T1, unchanged and likely degenerative in nature. There is no jumped or perched facet or other evidence of traumatic malalignment. Skull base and vertebrae: Skull base alignment is maintained. Vertebral body heights are preserved. There is no evidence of acute fracture. There is no suspicious osseous lesion. Soft tissues and spinal canal: Choose Disc levels: There is multilevel disc space narrowing and degenerative endplate change, most advanced at C5-C6 and  C6-C7. Is multilevel bulky facet arthropathy, most advanced at C3-C4 and C4-C5 on the right. Upper chest: The imaged lung apices are clear. Other: None. IMPRESSION: 1. No acute intracranial pathology. 2. No acute fracture or traumatic malalignment of the cervical spine. Electronically Signed   By: Valetta Mole M.D.   On: 11/27/2021 11:38   CT ABDOMEN PELVIS W CONTRAST  Result Date: 11/27/2021 CLINICAL DATA:  Abdominal pain, altered mental status.  Tenderness. EXAM: CT ABDOMEN AND PELVIS WITH CONTRAST TECHNIQUE: Multidetector CT imaging of the abdomen and pelvis was performed using the standard protocol following bolus administration of intravenous contrast. RADIATION DOSE REDUCTION: This exam was performed according to the departmental dose-optimization program which includes automated exposure control, adjustment of the mA and/or kV according to patient size and/or use of iterative reconstruction technique. CONTRAST:  46mL OMNIPAQUE IOHEXOL 350 MG/ML SOLN COMPARISON:  CT abdomen/pelvis 11/17/2021 FINDINGS: Lower chest: There are right larger than left pleural effusions with mild adjacent atelectasis in the lower lobes, similar to the prior CT from 11/17/2021. The heart size is stable. Coronary artery calcifications are again seen. There is no pericardial effusion. Hepatobiliary: The liver is unremarkable. The gallbladder is surgically absent. There is no biliary ductal dilatation. Pancreas: Unremarkable. Spleen: Unremarkable. Adrenals/Urinary Tract: The adrenals are unremarkable. The kidneys are unremarkable, with no focal lesion, stone, hydronephrosis, or hydroureter. There is symmetric excretion of contrast into the collecting systems on the delayed images. Bladder is unremarkable. Stomach/Bowel: There is a small hiatal hernia. The stomach is otherwise unremarkable. There is no evidence of bowel obstruction. There is no abnormal bowel wall thickening or inflammatory change. There is colonic diverticulosis  without evidence of acute diverticulitis. There is no evidence of appendicitis. Vascular/Lymphatic: There is calcified atherosclerotic plaque throughout the nonaneurysmal abdominal aorta. The major branch vessels are patent. The main portal and splenic veins are patent. There is no abdominopelvic lymphadenopathy. Reproductive: The uterus and adnexa are unremarkable. Other: There is no ascites or free air. Musculoskeletal: There is no acute osseous abnormality or suspicious osseous lesion. Postsurgical changes are again noted in the left femur. A remote fracture of the left inferior pubic ramus is again seen. Compression fractures of the T11  through L2 vertebral bodies are unchanged since the recent CT; however, the compression deformity of T11 is slightly progressed since the study from 11/16/2020. IMPRESSION: 1. No acute finding in the abdomen or pelvis or significant change since 11/17/2021. 2. Colonic diverticulosis without evidence of acute diverticulitis. 3. Unchanged small right larger than left pleural effusions with mild adjacent atelectasis. 4. Compression deformities of the T11 through L2 vertebral bodies are unchanged since the CT from 11/17/2021; however, the compression deformity of T11 is slightly progressed since 11/16/2020. Correlate with any symptoms, and consider lumbar spine MRI to evaluate for acute/subacute fracture if indicated. Electronically Signed   By: Valetta Mole M.D.   On: 11/27/2021 11:15   DG Chest Portable 1 View  Result Date: 11/27/2021 CLINICAL DATA:  Shortness of breath EXAM: PORTABLE CHEST 1 VIEW COMPARISON:  Shortness of breath FINDINGS: Cardiac and mediastinal contours are unchanged. Bibasilar atelectasis. Small left pleural effusion. No evidence of pneumothorax. IMPRESSION: Bibasilar atelectasis and small left pleural effusion. Electronically Signed   By: Yetta Glassman M.D.   On: 11/27/2021 09:34   CT ABDOMEN PELVIS W CONTRAST  Result Date: 11/17/2021 CLINICAL DATA:   Left upper quadrant and epigastric pain beginning 3 days ago. EXAM: CT ABDOMEN AND PELVIS WITH CONTRAST TECHNIQUE: Multidetector CT imaging of the abdomen and pelvis was performed using the standard protocol following bolus administration of intravenous contrast. RADIATION DOSE REDUCTION: This exam was performed according to the departmental dose-optimization program which includes automated exposure control, adjustment of the mA and/or kV according to patient size and/or use of iterative reconstruction technique. CONTRAST:  60mL OMNIPAQUE IOHEXOL 300 MG/ML  SOLN COMPARISON:  11/16/2020. FINDINGS: Lower chest: Trace pleural effusions. Mild dependent lung base opacities consistent with atelectasis. Hepatobiliary: No focal liver abnormality is seen. Status post cholecystectomy. No biliary dilatation. Pancreas: Unremarkable. No pancreatic ductal dilatation or surrounding inflammatory changes. Spleen: Normal in size without focal abnormality. Adrenals/Urinary Tract: Normal adrenal glands. Kidneys normal in size. Symmetric renal enhancement and excretion. No renal masses or stones. No hydronephrosis. Normal ureters. Normal bladder. Stomach/Bowel: Normal stomach. Small bowel and colon are normal in caliber. No wall thickening. No inflammation. Colonic diverticula without evidence of diverticulitis. No evidence of appendicitis. Vascular/Lymphatic: Aortic atherosclerosis. No aneurysm. No enlarged lymph nodes. Reproductive: Uterus and bilateral adnexa are unremarkable. Other: No abdominal wall hernia or abnormality. No abdominopelvic ascites. Musculoskeletal: Chronic compression fractures of T11, T12, L1 and L2. Old proximal left femur fracture reduced with an intramedullary rod and compression screw. No acute fractures. No bone lesions. Skeletal structures are demineralized. Degenerative changes noted throughout the visualized spine. IMPRESSION: 1. No acute findings within the abdomen or pelvis. 2. Trace pleural effusions  associated with dependent atelectasis. 3. Aortic atherosclerosis. Electronically Signed   By: Lajean Manes M.D.   On: 11/17/2021 16:09     Discharge Instructions: Discharge Instructions     Diet - low sodium heart healthy   Complete by: As directed    Diet - low sodium heart healthy   Complete by: As directed    Discharge instructions   Complete by: As directed    Mrs. Miltner,  It was a pleasure caring for you during your stay here at [facility].  You came in with altered mental status thought to be related to your medication, baclofen.  After stopping this medication, your symptoms have improved and you are regaining her strength.  Please note the following items: -Stop taking diltiazem 60 mg twice daily.  Start taking diltiazem 30 mg  3 times daily. -Stop taking pravastatin 80 mg daily.  Start taking Crestor 20 mg daily. -Take Tylenol 1000 mg 3 times daily -Please schedule a follow-up appointment with your cardiologist -Please schedule a follow-up appointment with your primary care physician in 1 to 2 weeks   Increase activity slowly   Complete by: As directed    Increase activity slowly   Complete by: As directed        Signed: Linward Natal, MD 12/03/2021, 1:48 PM   Pager: [phone]

## 2021-12-02 NOTE — Progress Notes (Signed)
Occupational Therapy Treatment Patient Details Name: Charlotte Powell MRN: [id number] DOB: [birth year] Today's Date: 12/02/2021   History of present illness Pt is a 88 y/o female who presented with toxic encephalopathy and progressive left upper quadrant pain. PMH: CAD s/p PCI w/ DES to RCA , a fib, hypothyroidism, GERD, and neuropathy. Admitted 11/27/21.   OT comments  Patient continues to make steady progress towards goals in skilled OT session. Patient's session encompassed sit<>stands and bed mobility, and ADLs seated EOB. Patient with improved mentation in session, however session limited in scope due to patient reporting dizziness throughout session. Patient able to stand with min-mod A and march in place, however unable to take standing BP due to patient "feeling like my legs were going numb". Patient's seated BP assessed at 129/70 (87). OT continuing to recommend SNF.   Recommendations for follow up therapy are one component of a multi-disciplinary discharge planning process, led by the attending physician.  Recommendations may be updated based on patient status, additional functional criteria and insurance authorization.    Follow Up Recommendations  Skilled nursing-short term rehab (<3 hours/day)    Assistance Recommended at Discharge Frequent or constant Supervision/Assistance  Patient can return home with the following  A lot of help with walking and/or transfers;A lot of help with bathing/dressing/bathroom;Direct supervision/assist for financial management;Help with stairs or ramp for entrance;Assist for transportation;Direct supervision/assist for medications management   Equipment Recommendations  Other (comment) (Defer to next venue)    Recommendations for Other Services      Precautions / Restrictions Precautions Precautions: Fall Precaution Comments: fragile skin Restrictions Weight Bearing Restrictions: No       Mobility Bed Mobility Overal bed mobility: Needs  Assistance Bed Mobility: Sit to Supine, Supine to Sit     Supine to sit: Min assist Sit to supine: Mod assist   General bed mobility comments: min A to come into sitting, assisting at the trunk to come into full upright position, patient able to scoot until feet were on ground. patient requiring mod A to return BLEs back into bed    Transfers Overall transfer level: Needs assistance Equipment used: Rolling walker (2 wheels) Transfers: Sit to/from Stand Sit to Stand: Min assist, Mod assist           General transfer comment: cues to use momentum to come into standing and hand positioning, close to min A to come into standing     Balance Overall balance assessment: Needs assistance Sitting-balance support: No upper extremity supported, Feet supported Sitting balance-Leahy Scale: Fair Sitting balance - Comments: good sitting balance initially, R lateral lean with increased time sitting EOB, citing dizziness Postural control: Posterior lean Standing balance support: Reliant on assistive device for balance, Bilateral upper extremity supported Standing balance-Leahy Scale: Poor Standing balance comment: reliant on RW                           ADL either performed or assessed with clinical judgement   ADL Overall ADL's : Needs assistance/impaired     Grooming: Brushing hair;Sitting;Set up Grooming Details (indicate cue type and reason): sitting EOB to comb hair                             Functional mobility during ADLs: Moderate assistance;Cueing for safety;Rolling walker (2 wheels) General ADL Comments: Patient with significant improvement in mentation, session limited due to dizziness    Extremity/Trunk Assessment  Vision       Perception     Praxis      Cognition Arousal/Alertness: Awake/alert Behavior During Therapy: WFL for tasks assessed/performed Overall Cognitive Status: Within Functional Limits for tasks assessed                                  General Comments: patient with significant improvement in mentation, witty remarks with all conversation which daughter endorses is her baseline        Exercises      Shoulder Instructions       General Comments      Pertinent Vitals/ Pain       Pain Assessment Pain Assessment: Faces Faces Pain Scale: Hurts even more Pain Location: mid upper quadrant pain (likely from compression fractures with movement) Pain Descriptors / Indicators: Grimacing, Guarding Pain Intervention(s): Limited activity within patient's tolerance, Monitored during session, Repositioned  Home Living                                          Prior Functioning/Environment              Frequency  Min 2X/week        Progress Toward Goals  OT Goals(current goals can now be found in the care plan section)  Progress towards OT goals: Progressing toward goals  Acute Rehab OT Goals Patient Stated Goal: to feel better and get back to ALF OT Goal Formulation: With patient/family Time For Goal Achievement: 12/12/21 Potential to Achieve Goals: Fair  Plan Discharge plan remains appropriate    Co-evaluation                 AM-PAC OT "6 Clicks" Daily Activity     Outcome Measure   Help from another person eating meals?: A Little Help from another person taking care of personal grooming?: A Little Help from another person toileting, which includes using toliet, bedpan, or urinal?: A Lot Help from another person bathing (including washing, rinsing, drying)?: A Lot Help from another person to put on and taking off regular upper body clothing?: A Little Help from another person to put on and taking off regular lower body clothing?: A Lot 6 Click Score: 15    End of Session Equipment Utilized During Treatment: Gait belt;Rolling walker (2 wheels)  OT Visit Diagnosis: Unsteadiness on feet (R26.81);Other abnormalities of gait and  mobility (R26.89);Muscle weakness (generalized) (M62.81)   Activity Tolerance Patient limited by fatigue   Patient Left in bed;with call bell/phone within reach;with family/visitor present;with bed alarm set   Nurse Communication Mobility status        Time: 6629-4765 OT Time Calculation (min): 21 min  Charges: OT General Charges $OT Visit: 1 Visit OT Treatments $Self Care/Home Management : 8-22 mins  Pollyann Glen E. Chiante Peden, OTR/L Acute Rehabilitation Services [phone]   Cherlyn Cushing 12/02/2021, 1:04 PM

## 2021-12-02 NOTE — Care Management Important Message (Signed)
Important Message  Patient Details  Name: Charlotte Powell MRN: [id number] Date of Birth: Oct 01, 1928   Medicare Important Message Given:  Yes     Charlotte Powell 12/02/2021, 3:42 PM

## 2021-12-03 MED ORDER — ACETAMINOPHEN 500 MG PO TABS: 1000.0000 mg | ORAL_TABLET | Freq: Three times a day (TID) | ORAL | 0 refills | Status: DC

## 2021-12-03 NOTE — NC FL2 (Signed)
[place] MEDICAID FL2 LEVEL OF CARE SCREENING TOOL     IDENTIFICATION  Patient Name: Charlotte Powell Birthdate: Jun 01, 1928 Sex: female Admission Date (Current Location): 11/27/2021  [facility] and [place] Number:  Herbalist and Address:  [facility] [place] [address] [place], [place] [address]      Provider Number: [id number]  Attending Physician Name and Address:  Angelica Pou, MD  Relative Name and Phone Number:       Current Level of Care: Hospital Recommended Level of Care: [place] Prior Approval Number:    Date Approved/Denied:   PASRR Number: [id number]  Discharge Plan: Other (Comment) (ALF)    Current Diagnoses: Patient Active Problem List   Diagnosis Date Noted   Acute exacerbation of chronic low back pain 11/30/2021   Chronic left shoulder pain 11/30/2021   Obtundation 11/28/2021   Medication adverse effect 11/27/2021   PAF (paroxysmal atrial fibrillation) ([place]) 04/22/2021   Chest pain of uncertain etiology 43/32/9518   Coronary artery disease involving native coronary artery of native heart without angina pectoris 04/22/2021   Chronic anticoagulation 04/22/2021   Atrial fibrillation with rapid ventricular response ([place]) 04/14/2021   Left upper arm pain 04/14/2021   Secondary hypercoagulable state ([place]) 03/21/2021   Viral gastroenteritis 02/09/2021   DNR (do not resuscitate)/DNI(Do Not Intubate) 02/09/2021   HLD (hyperlipidemia) 02/09/2021   CAD (coronary artery disease) 02/09/2021   Persistent atrial fibrillation ([place]) 11/16/2020   HTN (hypertension) 11/16/2020    Orientation RESPIRATION BLADDER Height & Weight     Self, Place  Normal Incontinent Weight: 142 lb (64.4 kg) Height:  5\' 3"  (160 cm)  BEHAVIORAL SYMPTOMS/MOOD NEUROLOGICAL BOWEL NUTRITION STATUS      Incontinent Diet (regular)  AMBULATORY STATUS COMMUNICATION OF NEEDS Skin   Limited Assist Verbally Normal                        Personal Care Assistance Level of Assistance  Bathing, Feeding, Dressing Bathing Assistance: Limited assistance Feeding assistance: Limited assistance Dressing Assistance: Limited assistance     Functional Limitations Info  Sight, Hearing, Speech Sight Info: Adequate Hearing Info: Adequate Speech Info: Adequate    SPECIAL CARE FACTORS FREQUENCY  PT (By licensed PT), OT (By licensed OT)     PT Frequency: 3x/wk with home health OT Frequency: 3x/wk with home health            Contractures Contractures Info: Not present    Additional Factors Info  Code Status, Allergies Code Status Info: DNR Allergies Info: Baclofen, Amoxicillin, Hydrocodone             Discharge Medications: STOP taking these medications     baclofen 10 MG tablet Commonly known as: LIORESAL    diltiazem 60 MG 12 hr capsule Commonly known as: CARDIZEM SR Replaced by: diltiazem 30 MG tablet    pravastatin 80 MG tablet Commonly known as: PRAVACHOL           TAKE these medications     acetaminophen 500 MG tablet Commonly known as: TYLENOL Take 2 tablets (1,000 mg total) by mouth 3 (three) times daily. What changed:  when to take this Another medication with the same name was removed. Continue taking this medication, and follow the directions you see here.    Cholecalciferol 25 MCG (1000 UT) capsule Take 1,000 Units by mouth daily.    diltiazem 30 MG tablet Commonly known as: Cardizem Take 1 tablet (30 mg  total) by mouth 3 (three) times daily. Replaces: diltiazem 60 MG 12 hr capsule    Eliquis 2.5 MG Tabs tablet Generic drug: apixaban Take 1 tablet (2.5 mg total) by mouth 2 (two) times daily.    fluticasone 50 MCG/ACT nasal spray Commonly known as: FLONASE Place 1 spray into both nostrils daily.    folic acid 800 MCG tablet Commonly known as: FOLVITE Take 800 mcg by mouth daily.    gabapentin 300 MG capsule Commonly known as: NEURONTIN Take 300 mg by mouth at bedtime.     levothyroxine 25 MCG tablet Commonly known as: SYNTHROID Take 25 mcg by mouth daily.    lidocaine 5 % ointment Commonly known as: XYLOCAINE Apply 1 Application topically 2 (two) times daily. To lower back What changed: Another medication with the same name was changed. Make sure you understand how and when to take each.    lidocaine 5 % Commonly known as: LIDODERM Place 1 patch onto the skin daily. Remove & Discard patch within 12 hours or as directed by MD What changed: additional instructions    melatonin 5 MG Tabs Take 5 mg by mouth at bedtime.    nitroGLYCERIN 0.4 MG SL tablet Commonly known as: NITROSTAT Place 0.4 mg under the tongue every 5 (five) minutes as needed for chest pain.    omeprazole 40 MG capsule Commonly known as: PRILOSEC Take 20 mg by mouth daily.    Polyethylene Glycol 3350 Powd Take 1 Capful by mouth daily as needed (constipation).    Reguloid 400 MG Caps Generic drug: Psyllium Take 400 mg by mouth 2 (two) times daily.    rosuvastatin 20 MG tablet Commonly known as: CRESTOR Take 1 tablet (20 mg total) by mouth daily.    saccharomyces boulardii 250 MG capsule Commonly known as: FLORASTOR Take 1 capsule (250 mg total) by mouth 2 (two) times daily.    simethicone 125 MG chewable tablet Commonly known as: MYLICON Chew 125 mg by mouth 4 (four) times daily as needed for flatulence.    Relevant Imaging Results:  Relevant Lab Results:   Additional Information SS#: [id number]  Baldemar Lenis, LCSW

## 2021-12-03 NOTE — TOC Transition Note (Signed)
Transition of Care (TOC) - CM/SW Discharge Note   Patient Details  Name: Charlotte Powell MRN: [id number] Date of Birth: 02/17/1928  Transition of Care (TOC) CM/SW Contact:   M , LCSW Phone Number: 12/03/2021, 2:23 PM   Clinical Narrative:   CSW confirmed with MD that patient is medically stable for return to ALF today. CSW sent discharge information to [facility], confirmed receipt. CSW met with patient and daughter, Sandra, at bedside to update. Patient will need PTAR due to weakness. CSW scheduled transport with PTAR for next available. CSW notified [facility] about patient returning to ALF for them to resume PT and OT for patient upon return. No other needs identified at this time.  Nurse to call report to [phone].    Final next level of care: Assisted Living Barriers to Discharge: Barriers Resolved   Patient Goals and CMS Choice     Choice offered to / list presented to : Adult Children  Discharge Placement              Patient chooses bed at:  ([facility]) Patient to be transferred to facility by: PT[facility] Name of family member notified: Self, Sandra Patient and family notified of of transfer: 12/03/21  Discharge Plan and Services     Post Acute Care Choice: Skilled Nursing Facility                    HH Arranged: PT, OT HH Agency:  ([facility]) Date HH Agency Contacted: 12/03/21 Time HH Agency Contacted: 1423 Representative spoke with at HH Agency: [facility]  Social Determinants of Health (SDOH) Interventions     Readmission Risk Interventions     No data to display             

## 2021-12-16 ENCOUNTER — Encounter: Payer: Self-pay | Admitting: Cardiology

## 2021-12-31 ENCOUNTER — Encounter: Payer: Self-pay | Admitting: Family

## 2021-12-31 ENCOUNTER — Ambulatory Visit (INDEPENDENT_AMBULATORY_CARE_PROVIDER_SITE_OTHER): Payer: Medicare HMO | Admitting: Family

## 2021-12-31 VITALS — BP 100/60 | HR 101 | Temp 97.6°F | Resp 16 | Ht 63.0 in

## 2021-12-31 DIAGNOSIS — Z7689 Persons encountering health services in other specified circumstances: Secondary | ICD-10-CM

## 2021-12-31 DIAGNOSIS — H259 Unspecified age-related cataract: Secondary | ICD-10-CM | POA: Diagnosis not present

## 2021-12-31 DIAGNOSIS — I10 Essential (primary) hypertension: Secondary | ICD-10-CM

## 2021-12-31 DIAGNOSIS — I482 Chronic atrial fibrillation, unspecified: Secondary | ICD-10-CM | POA: Diagnosis not present

## 2021-12-31 DIAGNOSIS — Z66 Do not resuscitate: Secondary | ICD-10-CM

## 2021-12-31 DIAGNOSIS — E039 Hypothyroidism, unspecified: Secondary | ICD-10-CM | POA: Diagnosis not present

## 2021-12-31 DIAGNOSIS — R2681 Unsteadiness on feet: Secondary | ICD-10-CM

## 2021-12-31 NOTE — Progress Notes (Signed)
Provider: Marlowe Sax FNP-C   Marilena Trevathan, Nelda Bucks, NP  Patient Care Team: Nica Friske, Nelda Bucks, NP as PCP - General (Family Medicine) Berniece Salines, DO as PCP - Cardiology (Cardiology)  Extended Emergency Contact Information Primary Emergency Contact: Lura Em of [place] Phone: [phone] Mobile Phone: [phone] Relation: Daughter  Code Status:  DNR Goals of care: Advanced Directive information    12/31/2021   10:21 AM  Advanced Directives  Does Patient Have a Medical Advance Directive? Yes  Type of [facility] of [place];Living will;Out of facility DNR (pink MOST or yellow form)  Does patient want to make changes to medical advance directive? No - Patient declined  Copy of [place] in Chart? No - copy requested     Chief Complaint  Patient presents with   Establish Care    New Patient.    HPI:  Pt is a 88 y.o. female seen today establish care here at [facility] for medical management of chronic diseases. She resides at [facility].Has medical history of essential hypertension, acquired hypothyroidism, hyperlipidemia, chronic atrial fibrillation, osteoarthritis, peripheral neuropathy, chronic left arm pain, slow transit constipation, unsteady gait, compression fracture T11-L2, chronic bilateral upper quadrant abdominal pain, chronic diverticulosis among other conditions. She is here with her daughter Marcille Buffy who provides additional HPI information.  Patient was seen in the ED on 11/17/2021 for left upper quadrant abdominal pain.  CT scan of the abdomen was done which showed no acute findings within the abdomen or pelvis.Trace pleural effusions associated with dependent atelectasis was noted.Also noted Aortic atherosclerosis.Also showed chronic compression fractures of T11, T12, L1 and L 2. Old proximal left femur fracture reduced with an  intramedullary rod and compression screw. No acute fractures or bone lesions.Skeletal structures were noted to be demineralized with degenerative changes noted throughout the visualized spine. Daughter states patient has upcoming appointment 01/21/2022 with Spine specialist. She describes pain as across upper abdomen which initially started on LUQ now goes across to RUQ.No radiation to back.  States was treated for possible constipation at the facility but no relief of pain.she was also treated with Baclofen but had a reaction was admitted at the hospital on 11/27/2021 with Altered Mental Status due to Baclofen.Pain thought to be from compression fracture on the back.  Patient and daughter have been cautious of new medication.   She continue to complain of Abdominal pain described as intermittent when sh moves around. Uses wheelchair and walker for short distance. She denies any fever,chills,nausea,vomiting,diarrhea,bloating or flatulence.  Has taken Tylenol 1000 mg tablet three times daily also 500 mg tablet every 8 hrs as needed for pain.states has not required her PRN dose.  Also uses lidocaine patch for left arm and lower back pain.  Working with physical therapy 3 times per week at the facility.  Also reports hx of hiatal hernia current on Omeprazole 20 mg daily which states has taken for many years.   Afib - follows up with Cardiologist.denies any headache,dizziness,fatigue,chest tightness,palpitation,chest pain or shortness of breath.   Hypothyroidism  -  on levothyroxine 25 mcg tablet daily latest labs TSH level was normal.  Hypertension - b/p are normal. On diltiazem  Podiatrist the facility removed right great toenail and was treated with ABX.Toe healing well no signs of infection reported.  She is due influenza vaccine: Tetanus, pneumococcal, Shingrix and COVID booster vaccine.  She declined influenza, Shingrix and pneumococcal vaccine.  Code Status -  DNR form scanned under  documents  Lab work done on 11/22/2021 reviewed and discussed during visit.   Past Medical History:  Diagnosis Date   CAD (coronary artery disease)    Diverticulitis    GERD (gastroesophageal reflux disease)    HLD (hyperlipidemia)    HTN (hypertension)    Hypothyroidism    Past Surgical History:  Procedure Laterality Date   ANKLE FRACTURE SURGERY     CHOLECYSTECTOMY     CORONARY STENT PLACEMENT     FEMUR FRACTURE SURGERY     WRIST FRACTURE SURGERY      Allergies  Allergen Reactions   Baclofen Other (See Comments)    hypoactive encephalopathy due to baclofen   Amoxicillin Other (See Comments)    unknown   Hydrocodone     Confusion and AMS    Allergies as of 12/31/2021       Reactions   Baclofen Other (See Comments)   hypoactive encephalopathy due to baclofen   Amoxicillin Other (See Comments)   unknown   Hydrocodone    Confusion and AMS        Medication List        Accurate as of December 31, 2021 10:44 AM. If you have any questions, ask your nurse or doctor.          acetaminophen 500 MG tablet Commonly known as: TYLENOL Take 2 tablets (1,000 mg total) by mouth 3 (three) times daily.   Cholecalciferol 25 MCG (1000 UT) capsule Take 1,000 Units by mouth daily.   diltiazem 30 MG tablet Commonly known as: Cardizem Take 1 tablet (30 mg total) by mouth 3 (three) times daily.   Eliquis 2.5 MG Tabs tablet Generic drug: apixaban Take 1 tablet (2.5 mg total) by mouth 2 (two) times daily.   fluticasone 50 MCG/ACT nasal spray Commonly known as: FLONASE Place 1 spray into both nostrils daily.   folic acid 800 MCG tablet Commonly known as: FOLVITE Take 800 mcg by mouth daily.   gabapentin 300 MG capsule Commonly known as: NEURONTIN Take 300 mg by mouth at bedtime.   levothyroxine 25 MCG tablet Commonly known as: SYNTHROID Take 25 mcg by mouth daily.   lidocaine 5 % ointment Commonly known as: XYLOCAINE Apply 1 Application topically 2 (two)  times daily. To lower back What changed: Another medication with the same name was removed. Continue taking this medication, and follow the directions you see here. Changed by: Caesar Bookman, NP   lidocaine 5 % Commonly known as: LIDODERM Place 1 patch onto the skin daily. Remove & Discard patch within 12 hours or as directed by MD  Apply to left shoulder What changed: Another medication with the same name was removed. Continue taking this medication, and follow the directions you see here. Changed by: Caesar Bookman, NP   melatonin 5 MG Tabs Take 5 mg by mouth at bedtime.   nitroGLYCERIN 0.4 MG SL tablet Commonly known as: NITROSTAT Place 0.4 mg under the tongue every 5 (five) minutes as needed for chest pain.   omeprazole 20 MG capsule Commonly known as: PRILOSEC Take 20 mg by mouth daily before breakfast. What changed: Another medication with the same name was removed. Continue taking this medication, and follow the directions you see here. Changed by: Caesar Bookman, NP   Polyethylene Glycol 3350 Powd Take 1 Capful by mouth daily as needed (constipation).   Reguloid 400 MG Caps Generic drug: Psyllium Take 400 mg by mouth 2 (two) times daily.  rosuvastatin 20 MG tablet Commonly known as: CRESTOR Take 1 tablet (20 mg total) by mouth daily.   saccharomyces boulardii 250 MG capsule Commonly known as: FLORASTOR Take 1 capsule (250 mg total) by mouth 2 (two) times daily.   simethicone 125 MG chewable tablet Commonly known as: MYLICON Chew 0000000 mg by mouth 4 (four) times daily as needed for flatulence.        Review of Systems  Constitutional:  Negative for appetite change, chills, fatigue, fever and unexpected weight change.  HENT:  Positive for hearing loss. Negative for congestion, dental problem, ear discharge, ear pain, facial swelling, nosebleeds, postnasal drip, rhinorrhea, sinus pressure, sinus pain, sneezing, sore throat, tinnitus and trouble swallowing.    Eyes:  Negative for pain, discharge, redness, itching and visual disturbance.  Respiratory:  Negative for cough, chest tightness, shortness of breath and wheezing.   Cardiovascular:  Negative for chest pain, palpitations and leg swelling.       Afib on Eliquis no signs of bleeding reported   Gastrointestinal:  Negative for abdominal distention, abdominal pain, blood in stool, constipation, diarrhea, nausea and vomiting.       Chronic ABD pain worst with movement  Endocrine: Negative for cold intolerance, heat intolerance, polydipsia, polyphagia and polyuria.  Genitourinary:  Negative for difficulty urinating, dysuria, flank pain, frequency and urgency.  Musculoskeletal:  Positive for arthralgias, back pain and gait problem. Negative for joint swelling, myalgias, neck pain and neck stiffness.       Unable to raise left hand due to previous fracture on left upper arm.Has lidocaine patch on arm and lower back   Skin:  Negative for color change, pallor, rash and wound.  Neurological:  Positive for numbness. Negative for dizziness, syncope, speech difficulty, weakness, light-headedness and headaches.       Numbness and tingling on feet on Gabapentin   Hematological:  Does not bruise/bleed easily.  Psychiatric/Behavioral:  Negative for agitation, behavioral problems, confusion, hallucinations, self-injury, sleep disturbance and suicidal ideas. The patient is not nervous/anxious.      There is no immunization history on file for this patient. Pertinent  Health Maintenance Due  Topic Date Due   DEXA SCAN  Never done   INFLUENZA VACCINE  Never done      12/01/2021    9:00 PM 12/02/2021    9:30 AM 12/02/2021    7:30 PM 12/03/2021    9:00 AM 12/31/2021   10:21 AM  Fall Risk  Falls in the past year?     0  Was there an injury with Fall?     0  Fall Risk Category Calculator     0  Fall Risk Category     Low  Patient Fall Risk Level High fall risk High fall risk High fall risk High fall risk  Low fall risk  Patient at Risk for Falls Due to     No Fall Risks  Fall risk Follow up     Falls evaluation completed   Functional Status Survey:    Vitals:   12/31/21 1019  BP: 100/60  Pulse: (!) 101  Resp: 16  Temp: 97.6 F (36.4 C)  SpO2: 98%  Height: 5\' 3"  (1.6 m)   Body mass index is 25.15 kg/m. Physical Exam Vitals reviewed.  Constitutional:      General: She is not in acute distress.    Appearance: Normal appearance. She is overweight. She is not ill-appearing or diaphoretic.  HENT:     Head: Normocephalic.  Right Ear: Tympanic membrane, ear canal and external ear normal. There is no impacted cerumen.     Left Ear: Tympanic membrane, ear canal and external ear normal. There is no impacted cerumen.     Nose: Nose normal. No congestion or rhinorrhea.     Mouth/Throat:     Mouth: Mucous membranes are moist.     Pharynx: Oropharynx is clear. No oropharyngeal exudate or posterior oropharyngeal erythema.  Eyes:     General: No scleral icterus.       Right eye: No discharge.        Left eye: No discharge.     Extraocular Movements: Extraocular movements intact.     Conjunctiva/sclera: Conjunctivae normal.     Pupils: Pupils are equal, round, and reactive to light.  Neck:     Vascular: No carotid bruit.  Cardiovascular:     Rate and Rhythm: Normal rate and regular rhythm.     Pulses: Normal pulses.     Heart sounds: Normal heart sounds. No murmur heard.    No friction rub. No gallop.  Pulmonary:     Effort: Pulmonary effort is normal. No respiratory distress.     Breath sounds: Normal breath sounds. No wheezing, rhonchi or rales.  Chest:     Chest wall: No tenderness.  Abdominal:     General: Bowel sounds are normal. There is no distension.     Palpations: Abdomen is soft. There is no mass.     Tenderness: There is no abdominal tenderness. There is no right CVA tenderness, left CVA tenderness, guarding or rebound.  Musculoskeletal:        General: No  swelling. Normal range of motion.     Right upper arm: Normal.     Left upper arm: Tenderness present. No swelling or edema.     Cervical back: Normal range of motion. No rigidity or tenderness.     Right lower leg: No edema.     Left lower leg: No edema.     Comments: Unable to raise left arm above her head guarding arm   Lymphadenopathy:     Cervical: No cervical adenopathy.  Skin:    General: Skin is warm and dry.     Coloration: Skin is not pale.     Findings: No bruising, erythema, lesion or rash.  Neurological:     Mental Status: She is alert and oriented to person, place, and time.     Cranial Nerves: No cranial nerve deficit.     Sensory: No sensory deficit.     Motor: No weakness.     Coordination: Coordination normal.     Gait: Gait normal.  Psychiatric:        Mood and Affect: Mood normal.        Speech: Speech normal.        Behavior: Behavior normal.        Thought Content: Thought content normal.        Judgment: Judgment normal.     Labs reviewed: Recent Labs    02/10/21 0956 02/11/21 0908 02/12/21 0958 04/14/21 0223 11/27/21 0925 11/27/21 0932 11/28/21 0442 11/29/21 0443  NA 135 135 136   < > 142 142 143 139  K 3.8 3.1* 4.3   < > 3.5 3.5 3.5 3.2*  CL 106 107 107   < > 110 106 104 103  CO2 23 22 23    < > 24  --  23 25  GLUCOSE 90 108* 118*   < >  161* 164* 131* 91  BUN 14 13 14    < > 7* 8 10 17   CREATININE 0.67 0.60 0.67   < > 0.78 0.60 0.86 0.82  CALCIUM 8.6* 8.3* 8.7*   < > 9.8  --  10.2 9.2  MG 2.0 1.6* 1.9  --   --   --   --   --   PHOS 2.7 2.4* 2.7  --   --   --   --   --    < > = values in this interval not displayed.   Recent Labs    11/17/21 1156 11/27/21 0925 11/28/21 0442  AST 14* 18 15  ALT 9 12 12   ALKPHOS 73 88 78  BILITOT 1.8* 1.4* 1.6*  PROT 7.0 7.2 6.6  ALBUMIN 4.0 3.8 3.5   Recent Labs    02/11/21 0908 02/12/21 0958 04/14/21 0223 11/17/21 1156 11/27/21 0925 11/27/21 0932 11/28/21 0442  WBC 4.5 5.9   < > 7.4 6.9   --  5.8  NEUTROABS 2.7 3.0  --   --  4.4  --   --   HGB 13.4 13.4   < > 13.3 15.0 15.6* 13.8  HCT 40.0 40.3   < > 39.7 43.9 46.0 40.2  MCV 102.0* 103.9*   < > 103.7* 103.5*  --  103.3*  PLT 124* 143*   < > 143* 177  --  188   < > = values in this interval not displayed.   Lab Results  Component Value Date   TSH 3.989 11/28/2021   No results found for: "HGBA1C" No results found for: "CHOL", "HDL", "LDLCALC", "LDLDIRECT", "TRIG", "CHOLHDL"  Significant Diagnostic Results in last 30 days:  No results found.  Assessment/Plan 1. Encounter to establish care Available records reviewed,declined influenza,shingles and PNA vaccine.Recommended to get Tdap at the pharmacy importance discussed. Recently had lab work 11/22/2021 will be repoeat lab o  2. Hypothyroidism (acquired) Lab Results  Component Value Date   TSH 3.989 11/28/2021  - continue on levothyroxine 25 mcg tablet daily   3. Hypertension, essential B/p well controlled  - continue on diltiazem   4. Chronic atrial fibrillation (HCC) HR controlled  - continue on diltiazem and Eliquis   5. Age-related cataract of both eyes, unspecified age-related cataract type No vision changes  - Ambulatory referral to Ophthalmology  6. DNR (do not resuscitate)/DNI(Do Not Intubate) DNR form in EPIC document - DNR (Do Not Resuscitate)  7. Unsteady gait Fall and safety precaution - continue with Physical therapy 3 X per week.  Family/ staff Communication: Reviewed plan of care with patient and daughter verbalized understanding   Labs/tests ordered: None   Next Appointment : Return in about 6 months (around 07/01/2022) for medical mangement of chronic issues.Sandrea Hughs, NP

## 2022-01-15 ENCOUNTER — Emergency Department (HOSPITAL_COMMUNITY): Payer: Medicare HMO

## 2022-01-15 ENCOUNTER — Inpatient Hospital Stay (HOSPITAL_COMMUNITY)
Admission: EM | Admit: 2022-01-15 | Discharge: 2022-01-21 | DRG: 392 | Disposition: A | Discharge status: Home Health | Payer: Medicare HMO | Source: Skilled Nursing Facility | Attending: Internal Medicine | Admitting: Internal Medicine

## 2022-01-15 ENCOUNTER — Other Ambulatory Visit: Payer: Self-pay

## 2022-01-15 DIAGNOSIS — Z955 Presence of coronary angioplasty implant and graft: Secondary | ICD-10-CM

## 2022-01-15 DIAGNOSIS — K219 Gastro-esophageal reflux disease without esophagitis: Secondary | ICD-10-CM | POA: Diagnosis present

## 2022-01-15 DIAGNOSIS — X58XXXA Exposure to other specified factors, initial encounter: Secondary | ICD-10-CM | POA: Diagnosis present

## 2022-01-15 DIAGNOSIS — D696 Thrombocytopenia, unspecified: Secondary | ICD-10-CM | POA: Diagnosis present

## 2022-01-15 DIAGNOSIS — Z66 Do not resuscitate: Secondary | ICD-10-CM | POA: Diagnosis present

## 2022-01-15 DIAGNOSIS — E039 Hypothyroidism, unspecified: Secondary | ICD-10-CM

## 2022-01-15 DIAGNOSIS — M545 Low back pain, unspecified: Secondary | ICD-10-CM | POA: Diagnosis present

## 2022-01-15 DIAGNOSIS — Z881 Allergy status to other antibiotic agents status: Secondary | ICD-10-CM

## 2022-01-15 DIAGNOSIS — Z79890 Hormone replacement therapy (postmenopausal): Secondary | ICD-10-CM

## 2022-01-15 DIAGNOSIS — A080 Rotaviral enteritis: Secondary | ICD-10-CM | POA: Diagnosis not present

## 2022-01-15 DIAGNOSIS — E785 Hyperlipidemia, unspecified: Secondary | ICD-10-CM | POA: Diagnosis present

## 2022-01-15 DIAGNOSIS — Z7901 Long term (current) use of anticoagulants: Secondary | ICD-10-CM

## 2022-01-15 DIAGNOSIS — I4819 Other persistent atrial fibrillation: Secondary | ICD-10-CM | POA: Diagnosis present

## 2022-01-15 DIAGNOSIS — E7800 Pure hypercholesterolemia, unspecified: Secondary | ICD-10-CM | POA: Diagnosis present

## 2022-01-15 DIAGNOSIS — Z885 Allergy status to narcotic agent status: Secondary | ICD-10-CM

## 2022-01-15 DIAGNOSIS — Z1152 Encounter for screening for COVID-19: Secondary | ICD-10-CM

## 2022-01-15 DIAGNOSIS — R109 Unspecified abdominal pain: Principal | ICD-10-CM

## 2022-01-15 DIAGNOSIS — R001 Bradycardia, unspecified: Secondary | ICD-10-CM | POA: Diagnosis not present

## 2022-01-15 DIAGNOSIS — Z9049 Acquired absence of other specified parts of digestive tract: Secondary | ICD-10-CM

## 2022-01-15 DIAGNOSIS — R197 Diarrhea, unspecified: Secondary | ICD-10-CM | POA: Diagnosis not present

## 2022-01-15 DIAGNOSIS — M4854XA Collapsed vertebra, not elsewhere classified, thoracic region, initial encounter for fracture: Secondary | ICD-10-CM | POA: Diagnosis present

## 2022-01-15 DIAGNOSIS — E876 Hypokalemia: Secondary | ICD-10-CM | POA: Diagnosis present

## 2022-01-15 DIAGNOSIS — R112 Nausea with vomiting, unspecified: Secondary | ICD-10-CM | POA: Diagnosis present

## 2022-01-15 DIAGNOSIS — R000 Tachycardia, unspecified: Secondary | ICD-10-CM | POA: Diagnosis present

## 2022-01-15 DIAGNOSIS — Z79899 Other long term (current) drug therapy: Secondary | ICD-10-CM

## 2022-01-15 DIAGNOSIS — H9190 Unspecified hearing loss, unspecified ear: Secondary | ICD-10-CM | POA: Diagnosis present

## 2022-01-15 DIAGNOSIS — I10 Essential (primary) hypertension: Secondary | ICD-10-CM | POA: Diagnosis present

## 2022-01-15 DIAGNOSIS — M4850XA Collapsed vertebra, not elsewhere classified, site unspecified, initial encounter for fracture: Secondary | ICD-10-CM | POA: Diagnosis present

## 2022-01-15 DIAGNOSIS — J9811 Atelectasis: Secondary | ICD-10-CM | POA: Diagnosis present

## 2022-01-15 DIAGNOSIS — I2510 Atherosclerotic heart disease of native coronary artery without angina pectoris: Secondary | ICD-10-CM | POA: Diagnosis present

## 2022-01-15 DIAGNOSIS — R1111 Vomiting without nausea: Secondary | ICD-10-CM

## 2022-01-15 DIAGNOSIS — E860 Dehydration: Secondary | ICD-10-CM | POA: Diagnosis present

## 2022-01-15 DIAGNOSIS — J90 Pleural effusion, not elsewhere classified: Secondary | ICD-10-CM | POA: Diagnosis present

## 2022-01-15 LAB — CBC WITH DIFFERENTIAL/PLATELET
Abs Immature Granulocytes: 0.03 10*3/uL (ref 0.00–0.07)
Basophils Absolute: 0 10*3/uL (ref 0.0–0.1)
Basophils Relative: 0 %
Eosinophils Absolute: 0 10*3/uL (ref 0.0–0.5)
Eosinophils Relative: 0 %
HCT: 43 % (ref 36.0–46.0)
Hemoglobin: 14.7 g/dL (ref 12.0–15.0)
Immature Granulocytes: 0 %
Lymphocytes Relative: 20 %
Lymphs Abs: 1.5 10*3/uL (ref 0.7–4.0)
MCH: 35.8 pg — ABNORMAL HIGH (ref 26.0–34.0)
MCHC: 34.2 g/dL (ref 30.0–36.0)
MCV: 104.6 fL — ABNORMAL HIGH (ref 80.0–100.0)
Monocytes Absolute: 0.2 10*3/uL (ref 0.1–1.0)
Monocytes Relative: 3 %
Neutro Abs: 5.7 10*3/uL (ref 1.7–7.7)
Neutrophils Relative %: 77 %
Platelets: 123 10*3/uL — ABNORMAL LOW (ref 150–400)
RBC: 4.11 MIL/uL (ref 3.87–5.11)
RDW: 13.4 % (ref 11.5–15.5)
WBC: 7.5 10*3/uL (ref 4.0–10.5)
nRBC: 0 % (ref 0.0–0.2)

## 2022-01-15 LAB — BRAIN NATRIURETIC PEPTIDE: B Natriuretic Peptide: 244.8 pg/mL — ABNORMAL HIGH (ref 0.0–100.0)

## 2022-01-15 MED ORDER — SUCRALFATE 1 G PO TABS: 1.0000 g | ORAL_TABLET | Freq: Three times a day (TID) | ORAL | 0 refills | Status: DC

## 2022-01-15 MED ORDER — FENTANYL CITRATE PF 50 MCG/ML IJ SOSY
50.0000 ug | PREFILLED_SYRINGE | Freq: Once | INTRAMUSCULAR | Status: AC
  Administered 2022-01-15: 50 ug via INTRAVENOUS
  Filled 2022-01-15: qty 1

## 2022-01-15 MED ORDER — ONDANSETRON HCL 4 MG/2ML IJ SOLN
4.0000 mg | Freq: Once | INTRAMUSCULAR | Status: AC
  Administered 2022-01-15: 4 mg via INTRAVENOUS
  Filled 2022-01-15: qty 2

## 2022-01-15 MED ORDER — METOPROLOL TARTRATE 5 MG/5ML IV SOLN
5.0000 mg | Freq: Once | INTRAVENOUS | Status: AC
  Administered 2022-01-15: 5 mg via INTRAVENOUS
  Filled 2022-01-15: qty 5

## 2022-01-15 MED ORDER — ONDANSETRON HCL 4 MG PO TABS: 4.0000 mg | ORAL_TABLET | Freq: Four times a day (QID) | ORAL | 0 refills | Status: DC

## 2022-01-15 MED ORDER — DILTIAZEM HCL 25 MG/5ML IV SOLN: 10.0000 mg | Freq: Once | INTRAVENOUS | Status: DC

## 2022-01-15 NOTE — ED Provider Notes (Signed)
[facility] EMERGENCY DEPARTMENT Provider Note   CSN: [id number] Arrival date & time: 01/15/22  2101     History  Chief Complaint  Patient presents with   Abdominal Pain    Charlotte Powell is a 88 y.o. female.  Patient arrives with abdominal pain, right flank pain and nausea vomiting.  History of A-fib on Eliquis and diltiazem, history of recent compression fractures in her back that have been thought to maybe be causing this discomfort that she has been having frequently.  She has been struggling with similar type pain in the last several weeks and months.  She supposed to see a spine specialist soon.  Family has been hesitant about using narcotic pain medicines because she has had adverse effects in the past.  She has had adverse effects of muscle relaxants as well.  She is stating that she is having some upper abdominal pain, right flank pain.  Denies any chest pain.  Heart rate was fast for EMS but would be between 90 and 150.  They thought that heart rate seem to go up when she was in discomfort.  She denies any diarrhea.  Denies any cough or sputum production.  Does have a history of CAD, high cholesterol.  The history is provided by the patient and a caregiver.       Home Medications Prior to Admission medications   Medication Sig Start Date End Date Taking? Authorizing Provider  ondansetron (ZOFRAN) 4 MG tablet Take 1 tablet (4 mg total) by mouth every 6 (six) hours. 01/15/22  Yes Sathvik Tiedt, DO  sucralfate (CARAFATE) 1 g tablet Take 1 tablet (1 g total) by mouth 4 (four) times daily -  with meals and at bedtime for 14 days. 01/15/22 01/29/22 Yes Chantella Creech, DO  acetaminophen (TYLENOL) 500 MG tablet Take 2 tablets (1,000 mg total) by mouth 3 (three) times daily. 12/03/21   Adron Bene, MD  acetaminophen (TYLENOL) 500 MG tablet Take 500 mg by mouth every 8 (eight) hours as needed for mild pain or moderate pain.    [provider]  apixaban  (ELIQUIS) 2.5 MG TABS tablet Take 1 tablet (2.5 mg total) by mouth 2 (two) times daily. 11/30/20   Ghimire, Werner Lean, MD  Cholecalciferol 25 MCG (1000 UT) capsule Take 1,000 Units by mouth daily.    [provider]  diltiazem (CARDIZEM) 30 MG tablet Take 1 tablet (30 mg total) by mouth 3 (three) times daily. 12/02/21 05/31/22  Marolyn Haller, MD  fluticasone [facility]) 50 MCG/ACT nasal spray Place 1 spray into both nostrils daily. 08/07/20   [provider]  folic acid (FOLVITE) 800 MCG tablet Take 800 mcg by mouth daily.    [provider]  gabapentin (NEURONTIN) 300 MG capsule Take 300 mg by mouth at bedtime.    [provider]  levothyroxine (SYNTHROID) 25 MCG tablet Take 25 mcg by mouth daily.    [provider]  lidocaine (LIDODERM) 5 % Place 1 patch onto the skin daily. Remove & Discard patch within 12 hours or as directed by MD  Apply to left shoulder    [provider]  lidocaine (LIDODERM) 5 % Place 1 patch onto the skin every morning. Apply 1 patch to lower back every morning and remove 12 hours later.    [provider]  lidocaine (XYLOCAINE) 5 % ointment Apply 1 Application topically 2 (two) times daily. To lower back    [provider]  lidocaine (XYLOCAINE) 5 %  ointment Apply 1 Application topically 2 (two) times daily as needed.    [provider]  loperamide (ANTI-DIARRHEAL) 2 MG tablet Take 2 mg by mouth every 6 (six) hours as needed for diarrhea or loose stools.    [provider]  melatonin 5 MG TABS Take 5 mg by mouth at bedtime.    [provider]  Menthol, Topical Analgesic, (BIOFREEZE) 4 % GEL Apply 1 Application topically daily. Apply to left upper quadrant abdominal area liberally.    [provider]  nitroGLYCERIN (NITROSTAT) 0.4 MG SL tablet Place 0.4 mg under the tongue every 5 (five) minutes as needed for chest pain. 08/07/20   [provider]  omeprazole  (PRILOSEC) 20 MG capsule Take 20 mg by mouth daily before breakfast.    [provider]  phenylephrine-shark liver oil-mineral oil-petrolatum (PREPARATION H) 0.25-14-74.9 % rectal ointment Place 1 Application rectally 4 (four) times daily as needed for hemorrhoids.    [provider]  Polyethylene Glycol 3350 POWD Take 1 Capful by mouth daily as needed (constipation).    [provider]  Psyllium (REGULOID) 400 MG CAPS Take 400 mg by mouth 2 (two) times daily.    [provider]  rosuvastatin (CRESTOR) 20 MG tablet Take 1 tablet (20 mg total) by mouth daily. 12/03/21   Rick Duff, MD  saccharomyces boulardii (FLORASTOR) 250 MG capsule Take 1 capsule (250 mg total) by mouth 2 (two) times daily. 02/12/21   Sheikh, Omair Latif, DO  senna-docusate (SENEXON-S) 8.6-50 MG tablet Take 1 tablet by mouth 3 (three) times a week. Monday, Wednesday, and Friday at bedtime.    [provider]  simethicone (MYLICON) 0000000 MG chewable tablet Chew 125 mg by mouth 4 (four) times daily as needed for flatulence.    [provider]      Allergies    Baclofen, Amoxicillin, and Hydrocodone    Review of Systems   Review of Systems  Physical Exam Updated Vital Signs BP (!) 140/94   Pulse (!) 117   Temp 98.3 F (36.8 C) (Oral)   Resp (!) 30   SpO2 98%  Physical Exam Vitals and nursing note reviewed.  Constitutional:      General: She is in acute distress.     Appearance: She is well-developed.  HENT:     Head: Normocephalic and atraumatic.     Mouth/Throat:     Mouth: Mucous membranes are moist.  Eyes:     Conjunctiva/sclera: Conjunctivae normal.  Cardiovascular:     Rate and Rhythm: Normal rate and regular rhythm.     Heart sounds: Normal heart sounds. No murmur heard. Pulmonary:     Effort: Pulmonary effort is normal. No respiratory distress.     Breath sounds: Normal breath sounds.  Abdominal:     Palpations: Abdomen is soft.      Tenderness: There is abdominal tenderness in the epigastric area. There is right CVA tenderness.     Hernia: No hernia is present.  Musculoskeletal:        General: No swelling.     Cervical back: Neck supple.  Skin:    General: Skin is warm and dry.     Capillary Refill: Capillary refill takes less than 2 seconds.  Neurological:     General: No focal deficit present.     Mental Status: She is alert and oriented to person, place, and time.     Cranial Nerves: No cranial nerve deficit.     Motor: No  weakness.  Psychiatric:        Mood and Affect: Mood normal.     ED Results / Procedures / Treatments   Labs (all labs ordered are listed, but only abnormal results are displayed) Labs Reviewed  CBC WITH DIFFERENTIAL/PLATELET - Abnormal; Notable for the following components:      Result Value   MCV 104.6 (*)    MCH 35.8 (*)    Platelets 123 (*)    All other components within normal limits  BRAIN NATRIURETIC PEPTIDE - Abnormal; Notable for the following components:   B Natriuretic Peptide 244.8 (*)    All other components within normal limits  RESP PANEL BY RT-PCR (FLU A&B, COVID) [id number]  COMPREHENSIVE METABOLIC PANEL  LIPASE, BLOOD  TROPONIN I (HIGH SENSITIVITY)  TROPONIN I (HIGH SENSITIVITY)    EKG EKG Interpretation  Date/Time:  Wednesday January 15 2022 22:51:47 EST Ventricular Rate:  104 PR Interval:    QRS Duration: 69 QT Interval:  284 QTC Calculation: 348 R Axis:   90 Text Interpretation: Atrial fibrillation Borderline right axis deviation Low voltage, precordial leads Minimal ST depression, anterolateral leads Confirmed by Lennice Sites ([id number]) on 01/15/2022 11:22:22 PM  Radiology CT ABDOMEN PELVIS WO CONTRAST  Result Date: 01/15/2022 CLINICAL DATA:  Abdominal pain, acute, nonlocalized. Nausea and vomiting starting around 7pm with epigastric discomfort and generalized body pain. EXAM: CT ABDOMEN AND PELVIS WITHOUT CONTRAST TECHNIQUE: Multidetector CT imaging of  the abdomen and pelvis was performed following the standard protocol without IV contrast. RADIATION DOSE REDUCTION: This exam was performed according to the departmental dose-optimization program which includes automated exposure control, adjustment of the mA and/or kV according to patient size and/or use of iterative reconstruction technique. COMPARISON:  CT abdomen pelvis 11/27/2021 FINDINGS: Lower chest: Persistent bilateral trace pleural effusions. Bilateral lower lobe subsegmental atelectasis. Four-vessel coronary calcification. Tiny hiatal hernia. Hepatobiliary: No focal liver abnormality. Status post cholecystectomy. No biliary dilatation. Pancreas: Diffusely atrophic. No focal lesion. Otherwise normal pancreatic contour. No surrounding inflammatory changes. No main pancreatic ductal dilatation. Spleen: Normal in size without focal abnormality. Adrenals/Urinary Tract: No adrenal nodule bilaterally. No nephrolithiasis and no hydronephrosis. No definite contour-deforming renal mass. No ureterolithiasis or hydroureter. The urinary bladder is unremarkable. Stomach/Bowel: Stomach is within normal limits. No evidence of bowel wall thickening or dilatation. Colonic diverticulosis. Appendix appears normal. Vascular/Lymphatic: No abdominal aorta or iliac aneurysm. Severe atherosclerotic plaque of the aorta and its branches. No abdominal, pelvic, or inguinal lymphadenopathy. Reproductive: Uterus and bilateral adnexa are unremarkable. Other: No intraperitoneal free fluid. No intraperitoneal free gas. No organized fluid collection. Musculoskeletal: No abdominal wall hernia or abnormality. No suspicious lytic or blastic osseous lesions. No acute displaced fracture. Interval worsening of T7, T9, T11 compression fractures. Stable T6, T8, T12, L1, L2 compression fractures. Multilevel severe degenerative changes of the spine with multilevel intervertebral disc space vacuum phenomenon. Severe right L5-S1 osseous neural  foraminal stenosis. Intramedullary nail fixation of the left proximal femur partially visualized. IMPRESSION: 1. Persistent bilateral trace pleural effusions with associated passive atelectasis. 2. Tiny hiatal hernia. 3. Colonic diverticulosis with no acute diverticulitis. 4. Interval worsening of T7, T9, T11 compression fractures. Stable T6, T8, T12, L1, L2 compression fractures. Correlate with point tenderness to palpation to evaluate for an acute component. 5. Severe multilevel degenerative changes of the spine. 6.  Aortic Atherosclerosis (ICD10-I70.0). Electronically Signed   By: Iven Finn M.D.   On: 01/15/2022 22:22   DG Chest Portable 1 View  Result Date: 01/15/2022 CLINICAL DATA:  Epigastric discomfort and generalized body pain EXAM: PORTABLE CHEST 1 VIEW COMPARISON:  Radiograph 11/27/2021. FINDINGS: Stable cardiomediastinal silhouette. Aortic atherosclerotic calcification. Unchanged left basilar atelectasis and small left pleural effusion or pleural thickening. Right lung is clear. No pneumothorax. Demineralization. Advanced degenerative arthritis left shoulder. IMPRESSION: Unchanged left basilar atelectasis and left pleural effusion or pleural thickening. Electronically Signed   By: Placido Sou M.D.   On: 01/15/2022 21:45    Procedures Procedures    Medications Ordered in ED Medications  ondansetron [facility]) injection 4 mg (4 mg Intravenous Given 01/15/22 2123)  fentaNYL (SUBLIMAZE) injection 50 mcg (50 mcg Intravenous Given 01/15/22 2125)  metoprolol tartrate (LOPRESSOR) injection 5 mg (5 mg Intravenous Given 01/15/22 2334)    ED Course/ Medical Decision Making/ A&P                           Medical Decision Making Amount and/or Complexity of Data Reviewed Labs: ordered. Radiology: ordered.  Risk Prescription drug management.   Lowanda Foster is here with abdominal pain/flank pain/nausea and vomiting.  Patient arrives with unremarkable vitals.  Heart rate fairly  labile but suspect that this is due to acute pain.  Heart rate between 90 and 120.  History of A-fib.  Took diltiazem tonight.  She has been dealing with some compression fractures recently.  Narcotic pain medicine and muscle relaxants have made her confused at times and family reluctant to use these medications.  She had an acute episode of discomfort this evening associated with nausea and vomiting.  She is struggling maybe with some hiatal hernia and GI symptoms as well the last few months.  She denies any suspicious food intake.  No diarrhea.  She denies any chest pain or shortness of breath.  Patient appears uncomfortable.  She is tender in the right flank and epigastric area on exam.  Differential diagnosis could be acute on chronic pain from compression fractures or GI related discomfort, seems less likely to be kidney stone or bowel obstruction or pneumonia or ACS.  She is on blood thinners and doubt PE.  I am not concerned for dissection.  She is not having any urinary symptoms.  She has had multiple episodes of discomfort like this recently with unremarkable workups per family.  Will give her dose IV fentanyl and IV Zofran for discomfort.  Will reevaluate her heart rate after that.  Will check CBC, CMP, lipase, troponin, BNP, chest x-ray, CT scan abdomen and pelvis.  On reevaluation after pain medicine heart rate much better in the 80s.  I do not think she has A-fib with RVR.  EKG shows rate controlled atrial fibrillation.  She appears always be in A-fib.  She is feeling much better after IV fentanyl and Zofran and resting very comfortably.  Vital signs on reevaluation are normal.  CT scan of the abdomen and pelvis per radiology report with no acute findings.  She has persistent small trace pleural effusions.  She has a hiatal hernia.  Her compression fractions in her back have in some areas progressed slightly and have been stable.  She is post to see a spine surgeon next week to discuss pain  management.  Overall I suspect this could be the cause of her discomfort.  She has no significant leukocytosis or anemia.  Awaiting troponin, CMP and anticipate discharge back to facility.  Family will continue lidocaine patches, Tylenol.  Will prescribe Zofran as needed for nausea and vomiting.  Will also prescribe  Carafate in case this is more reflux related as well.  CMP and lipase hemolyzed as well as troponin.  This is being redrawn.  COVID and flu test pending.  Lopressor has been given to help with some of her tachycardia as well.  Patient's been signed out to oncoming ED staff.  Anticipate discharge if remaining workup is unremarkable.  This chart was dictated using voice recognition software.  Despite best efforts to proofread,  errors can occur which can change the documentation meaning.         Final Clinical Impression(s) / ED Diagnoses Final diagnoses:  Abdominal pain, unspecified abdominal location  Vomiting without nausea, unspecified vomiting type  Acute low back pain without sciatica, unspecified back pain laterality    Rx / DC Orders ED Discharge Orders          Ordered    ondansetron (ZOFRAN) 4 MG tablet  Every 6 hours        01/15/22 2301    sucralfate (CARAFATE) 1 g tablet  3 times daily with meals & bedtime        01/15/22 [place], [place], DO 01/15/22 2338

## 2022-01-15 NOTE — ED Triage Notes (Signed)
Pt BIB [facility] from [facility] senior living c/o nausea and vomiting starting around 7pm with epigastric discomfort and generalized body pain. EMS found pt to be in afib/RVR upon arrival, HR 90-150. BP 180/108. RR 26, 95% RA.

## 2022-01-15 NOTE — ED Provider Notes (Addendum)
  Provider Note MRN:  [id number]  Arrival date & time: 01/16/22    ED Course and Medical Decision Making  Assumed care from Dr. Lockie Mola at shift change.  Chronic back and abdominal pain with overall reassuring abdominal CT, awaiting labs, providing a little bit of Lopressor for some labile heart rates, suspect can be discharged if labs reassuring.  2:30 AM update: Patient's labs were reassuring and the plan was for discharge, however upon [facility] arrival patient began having more vomiting and diarrhea.  Feels unwell, will provide further symptomatic management and reassess discharge plan in a couple hours.  5:30 AM update: Patient continues to have a soft nontender abdomen, normal vital signs, she still has some hesitancy to go home however there is no indication for further testing or admission, she is now appropriate for discharge and she and daughter are agreeable with outpatient plan.  6:30 AM update: Once again patient has had return of vomiting and diarrhea, is now tachycardic, suspect dehydration, doubt her ability to hydrate at home, accepted for admission to hospital service.  Procedures  Final Clinical Impressions(s) / ED Diagnoses     ICD-10-CM   1. Abdominal pain, unspecified abdominal location  R10.9     2. Vomiting without nausea, unspecified vomiting type  R11.11     3. Acute low back pain without sciatica, unspecified back pain laterality  M54.50     4. Diarrhea of presumed infectious origin  R19.7       ED Discharge Orders          Ordered    loperamide (IMODIUM) 2 MG capsule  4 times daily PRN        01/16/22 0542    dicyclomine (BENTYL) 20 MG tablet  2 times daily        01/16/22 0542    ondansetron (ZOFRAN) 4 MG tablet  Every 6 hours        01/15/22 2301    sucralfate (CARAFATE) 1 g tablet  3 times daily with meals & bedtime        01/15/22 2301               Elmer Sow. Pilar Plate, MD [facility] [email or web address]    Sabas Sous, MD 01/16/22 0543    Sabas Sous, MD 01/16/22 [phone]

## 2022-01-16 ENCOUNTER — Encounter (HOSPITAL_COMMUNITY): Payer: Self-pay | Admitting: Internal Medicine

## 2022-01-16 DIAGNOSIS — I4819 Other persistent atrial fibrillation: Secondary | ICD-10-CM | POA: Diagnosis not present

## 2022-01-16 DIAGNOSIS — D696 Thrombocytopenia, unspecified: Secondary | ICD-10-CM

## 2022-01-16 DIAGNOSIS — Z66 Do not resuscitate: Secondary | ICD-10-CM

## 2022-01-16 DIAGNOSIS — R112 Nausea with vomiting, unspecified: Secondary | ICD-10-CM | POA: Diagnosis present

## 2022-01-16 DIAGNOSIS — I10 Essential (primary) hypertension: Secondary | ICD-10-CM

## 2022-01-16 DIAGNOSIS — M4850XA Collapsed vertebra, not elsewhere classified, site unspecified, initial encounter for fracture: Secondary | ICD-10-CM | POA: Diagnosis present

## 2022-01-16 DIAGNOSIS — Z7901 Long term (current) use of anticoagulants: Secondary | ICD-10-CM | POA: Diagnosis not present

## 2022-01-16 DIAGNOSIS — R197 Diarrhea, unspecified: Secondary | ICD-10-CM

## 2022-01-16 DIAGNOSIS — S32000G Wedge compression fracture of unspecified lumbar vertebra, subsequent encounter for fracture with delayed healing: Secondary | ICD-10-CM

## 2022-01-16 DIAGNOSIS — S22000G Wedge compression fracture of unspecified thoracic vertebra, subsequent encounter for fracture with delayed healing: Secondary | ICD-10-CM

## 2022-01-16 DIAGNOSIS — E782 Mixed hyperlipidemia: Secondary | ICD-10-CM

## 2022-01-16 DIAGNOSIS — E039 Hypothyroidism, unspecified: Secondary | ICD-10-CM

## 2022-01-16 LAB — URINALYSIS, ROUTINE W REFLEX MICROSCOPIC
Bilirubin Urine: NEGATIVE
Glucose, UA: NEGATIVE mg/dL
Ketones, ur: 20 mg/dL — AB
Nitrite: NEGATIVE
Protein, ur: 30 mg/dL — AB
Specific Gravity, Urine: 1.024 (ref 1.005–1.030)
pH: 5 (ref 5.0–8.0)

## 2022-01-16 LAB — COMPREHENSIVE METABOLIC PANEL
ALT: 12 U/L (ref 0–44)
AST: 22 U/L (ref 15–41)
Albumin: 3.5 g/dL (ref 3.5–5.0)
Alkaline Phosphatase: 77 U/L (ref 38–126)
Anion gap: 8 (ref 5–15)
BUN: 10 mg/dL (ref 8–23)
CO2: 23 mmol/L (ref 22–32)
Calcium: 9 mg/dL (ref 8.9–10.3)
Chloride: 108 mmol/L (ref 98–111)
Creatinine, Ser: 0.65 mg/dL (ref 0.44–1.00)
GFR, Estimated: >60 mL/min (ref >60)
Glucose, Bld: 130 mg/dL — ABNORMAL HIGH (ref 70–99)
Potassium: 3.5 mmol/L (ref 3.5–5.1)
Sodium: 139 mmol/L (ref 135–145)
Total Bilirubin: 1.3 mg/dL — ABNORMAL HIGH (ref 0.3–1.2)
Total Protein: 6.3 g/dL — ABNORMAL LOW (ref 6.5–8.1)

## 2022-01-16 LAB — RESP PANEL BY RT-PCR (FLU A&B, COVID) ARPGX2
Influenza A by PCR: NEGATIVE
Influenza B by PCR: NEGATIVE
SARS Coronavirus 2 by RT PCR: NEGATIVE

## 2022-01-16 LAB — C DIFFICILE QUICK SCREEN W PCR REFLEX
C Diff antigen: NEGATIVE
C Diff interpretation: NOT DETECTED
C Diff toxin: NEGATIVE

## 2022-01-16 LAB — LIPASE, BLOOD: Lipase: 32 U/L (ref 11–51)

## 2022-01-16 LAB — TROPONIN I (HIGH SENSITIVITY): Troponin I (High Sensitivity): 6 ng/L (ref <18)

## 2022-01-16 MED ORDER — DILTIAZEM HCL 25 MG/5ML IV SOLN: 5.0000 mg | Freq: Once | INTRAVENOUS | Status: DC

## 2022-01-16 MED ORDER — RISAQUAD PO CAPS
1.0000 | ORAL_CAPSULE | Freq: Two times a day (BID) | ORAL | Status: DC
  Administered 2022-01-16 – 2022-01-21 (×10): 1 via ORAL
  Filled 2022-01-16 (×11): qty 1

## 2022-01-16 MED ORDER — GABAPENTIN 300 MG PO CAPS
300.0000 mg | ORAL_CAPSULE | Freq: Every day | ORAL | Status: DC
  Administered 2022-01-16 – 2022-01-20 (×5): 300 mg via ORAL
  Filled 2022-01-16 (×5): qty 1

## 2022-01-16 MED ORDER — SODIUM CHLORIDE 0.9 % IV SOLN: INTRAVENOUS | Status: DC

## 2022-01-16 MED ORDER — SODIUM CHLORIDE 0.9 % IV BOLUS
500.0000 mL | Freq: Once | INTRAVENOUS | Status: AC
  Administered 2022-01-16: 500 mL via INTRAVENOUS

## 2022-01-16 MED ORDER — APIXABAN 2.5 MG PO TABS
2.5000 mg | ORAL_TABLET | Freq: Two times a day (BID) | ORAL | Status: DC
  Administered 2022-01-16 – 2022-01-21 (×11): 2.5 mg via ORAL
  Filled 2022-01-16 (×12): qty 1

## 2022-01-16 MED ORDER — LOPERAMIDE HCL 2 MG PO CAPS: 2.0000 mg | ORAL_CAPSULE | Freq: Four times a day (QID) | ORAL | 0 refills | Status: DC | PRN

## 2022-01-16 MED ORDER — ACETAMINOPHEN 325 MG PO TABS
650.0000 mg | ORAL_TABLET | Freq: Four times a day (QID) | ORAL | Status: DC | PRN
  Administered 2022-01-16 – 2022-01-18 (×2): 650 mg via ORAL
  Filled 2022-01-16 (×3): qty 2

## 2022-01-16 MED ORDER — SIMETHICONE 80 MG PO CHEW: 80.0000 mg | CHEWABLE_TABLET | Freq: Four times a day (QID) | ORAL | Status: DC | PRN

## 2022-01-16 MED ORDER — DILTIAZEM HCL 25 MG/5ML IV SOLN: 2.5000 mg | Freq: Once | INTRAVENOUS | Status: AC

## 2022-01-16 MED ORDER — LOPERAMIDE HCL 2 MG PO CAPS
2.0000 mg | ORAL_CAPSULE | Freq: Once | ORAL | Status: AC
  Administered 2022-01-16: 2 mg via ORAL
  Filled 2022-01-16: qty 1

## 2022-01-16 MED ORDER — DILTIAZEM HCL 60 MG PO TABS
30.0000 mg | ORAL_TABLET | Freq: Three times a day (TID) | ORAL | Status: DC
  Administered 2022-01-16 (×3): 30 mg via ORAL
  Filled 2022-01-16 (×5): qty 1

## 2022-01-16 MED ORDER — ROSUVASTATIN CALCIUM 20 MG PO TABS
20.0000 mg | ORAL_TABLET | Freq: Every day | ORAL | Status: DC
  Administered 2022-01-16 – 2022-01-21 (×6): 20 mg via ORAL
  Filled 2022-01-16 (×6): qty 1

## 2022-01-16 MED ORDER — PANTOPRAZOLE SODIUM 40 MG PO TBEC
40.0000 mg | DELAYED_RELEASE_TABLET | Freq: Every day | ORAL | Status: DC
  Administered 2022-01-17 – 2022-01-21 (×5): 40 mg via ORAL
  Filled 2022-01-16 (×5): qty 1

## 2022-01-16 MED ORDER — DILTIAZEM HCL-DEXTROSE 125-5 MG/125ML-% IV SOLN (PREMIX)
5.0000 mg/h | INTRAVENOUS | Status: DC
  Administered 2022-01-16: 5 mg/h via INTRAVENOUS
  Filled 2022-01-16: qty 125

## 2022-01-16 MED ORDER — ALBUTEROL SULFATE (2.5 MG/3ML) 0.083% IN NEBU: 2.5000 mg | INHALATION_SOLUTION | Freq: Four times a day (QID) | RESPIRATORY_TRACT | Status: DC | PRN

## 2022-01-16 MED ORDER — PHENYLEPHRINE-MINERAL OIL-PET 0.25-14-74.9 % RE OINT: 1.0000 | TOPICAL_OINTMENT | Freq: Four times a day (QID) | RECTAL | Status: DC | PRN

## 2022-01-16 MED ORDER — SODIUM CHLORIDE 0.9% FLUSH
3.0000 mL | Freq: Two times a day (BID) | INTRAVENOUS | Status: DC
  Administered 2022-01-16 – 2022-01-18 (×4): 3 mL via INTRAVENOUS

## 2022-01-16 MED ORDER — PANTOPRAZOLE SODIUM 40 MG IV SOLR
40.0000 mg | INTRAVENOUS | Status: DC
  Administered 2022-01-16: 40 mg via INTRAVENOUS
  Filled 2022-01-16: qty 10

## 2022-01-16 MED ORDER — DILTIAZEM HCL 25 MG/5ML IV SOLN
2.5000 mg | Freq: Once | INTRAVENOUS | Status: AC
  Administered 2022-01-16: 2.5 mg via INTRAVENOUS
  Filled 2022-01-16: qty 5

## 2022-01-16 MED ORDER — POTASSIUM CHLORIDE CRYS ER 20 MEQ PO TBCR
40.0000 meq | EXTENDED_RELEASE_TABLET | Freq: Once | ORAL | Status: AC
  Administered 2022-01-16: 40 meq via ORAL
  Filled 2022-01-16: qty 2

## 2022-01-16 MED ORDER — ONDANSETRON HCL 4 MG/2ML IJ SOLN
4.0000 mg | Freq: Once | INTRAMUSCULAR | Status: AC
  Administered 2022-01-16: 4 mg via INTRAVENOUS

## 2022-01-16 MED ORDER — DICYCLOMINE HCL 20 MG PO TABS: 20.0000 mg | ORAL_TABLET | Freq: Two times a day (BID) | ORAL | 0 refills | Status: DC

## 2022-01-16 MED ORDER — MELATONIN 5 MG PO TABS
5.0000 mg | ORAL_TABLET | Freq: Every day | ORAL | Status: DC
  Administered 2022-01-16 – 2022-01-20 (×5): 5 mg via ORAL
  Filled 2022-01-16 (×6): qty 1

## 2022-01-16 MED ORDER — ONDANSETRON HCL 4 MG/2ML IJ SOLN
4.0000 mg | Freq: Once | INTRAMUSCULAR | Status: AC
  Administered 2022-01-16: 4 mg via INTRAVENOUS
  Filled 2022-01-16: qty 2

## 2022-01-16 MED ORDER — FLUTICASONE PROPIONATE 50 MCG/ACT NA SUSP
1.0000 | Freq: Every day | NASAL | Status: DC
  Administered 2022-01-16: 1 via NASAL
  Administered 2022-01-18 – 2022-01-21 (×4): 2 via NASAL
  Filled 2022-01-16 (×2): qty 16

## 2022-01-16 MED ORDER — ACETAMINOPHEN 650 MG RE SUPP: 650.0000 mg | Freq: Four times a day (QID) | RECTAL | Status: DC | PRN

## 2022-01-16 MED ORDER — ONDANSETRON HCL 4 MG PO TABS: 4.0000 mg | ORAL_TABLET | Freq: Four times a day (QID) | ORAL | Status: DC | PRN

## 2022-01-16 MED ORDER — ONDANSETRON HCL 4 MG/2ML IJ SOLN
4.0000 mg | Freq: Four times a day (QID) | INTRAMUSCULAR | Status: DC | PRN
  Administered 2022-01-16 – 2022-01-18 (×3): 4 mg via INTRAVENOUS
  Filled 2022-01-16 (×3): qty 2

## 2022-01-16 MED ORDER — DILTIAZEM HCL 25 MG/5ML IV SOLN
INTRAVENOUS | Status: AC
  Administered 2022-01-16: 2.5 mg via INTRAVENOUS
  Filled 2022-01-16: qty 5

## 2022-01-16 MED ORDER — LEVOTHYROXINE SODIUM 25 MCG PO TABS
25.0000 ug | ORAL_TABLET | Freq: Every day | ORAL | Status: DC
  Administered 2022-01-16 – 2022-01-21 (×6): 25 ug via ORAL
  Filled 2022-01-16 (×6): qty 1

## 2022-01-16 MED ORDER — ONDANSETRON HCL 4 MG/2ML IJ SOLN
INTRAMUSCULAR | Status: AC
  Filled 2022-01-16: qty 2

## 2022-01-16 MED ORDER — LORAZEPAM 2 MG/ML IJ SOLN: 0.2500 mg | INTRAMUSCULAR | Status: DC

## 2022-01-16 NOTE — Progress Notes (Addendum)
[facility] night cross cover note:   I was notified by RN  that this patient remains in atrial fibrillation with RVR, with heart rates in the 130s to 150s following scheduled dose of oral diltiazem 30 mg.  She has also received at least 2 IV pushes of diltiazem earlier in the day.  She is reportedly asymptomatic at this time.  Systolic blood pressures in the 140s mmHg.   I have placed order to initiate diltiazem drip at this time, without preceding loading bolus, as the patient has already received multiple dose of PO/IV Diltiazem over the last few hours.   Update: patient with recurrent N/V, but is not yet eligible for her next prn dose of Zofran. I subsequently added prn IV Compazine for refractory nausea/vomiting.   Newton Pigg, DO Hospitalist

## 2022-01-16 NOTE — ED Notes (Signed)
As soon as Charlotte Powell arrived pt began vomiting and had Diarrhea with watery stool.  Dr Pilar Plate at [facility] - stated we would watch her for a couple of hours.

## 2022-01-16 NOTE — ED Notes (Signed)
Pt had 1 loose stool episode. Unable to get sample. Pt diaper was soaked in feces but not enough to draw sample.

## 2022-01-16 NOTE — Progress Notes (Signed)
Received to 7O16 via stretcher from ED. Placed on Enteric precautions for R/O C Dif and GI panel ordered. C/O generalized pain and RLQ pain. Tylenol given in ED. Denies further needs at this time. Oriented to room/unit . Call bell in reach. Daughter at bedside helping order Supper from dietary.

## 2022-01-16 NOTE — ED Notes (Signed)
[facility] Called 

## 2022-01-16 NOTE — ED Notes (Signed)
Breakfast tray ordered 

## 2022-01-16 NOTE — Progress Notes (Signed)
   01/16/22 2035  Assess: MEWS Score  BP (!) 146/82  MAP (mmHg) 100  Pulse Rate (!) 114  ECG Heart Rate (!) 149  Resp (!) 24  SpO2 95 %  Assess: MEWS Score  MEWS Temp 0  MEWS Systolic 0  MEWS Pulse 3  MEWS RR 1  MEWS LOC 2  MEWS Score 6  MEWS Score Color Red  Assess: if the MEWS score is Yellow or Red  Were vital signs taken at a resting state? Yes  Focused Assessment No change from prior assessment  Does the patient meet 2 or more of the SIRS criteria? Yes  Does the patient have a confirmed or suspected source of infection? No  MEWS guidelines implemented *See Row Information* Yes  Treat  MEWS Interventions Administered scheduled meds/treatments;Escalated (See documentation below)  Pain Scale 0-10  Pain Score 0  Patients Stated Pain Goal 0  Take Vital Signs  Increase Vital Sign Frequency  Red: Q 1hr X 4 then Q 4hr X 4, if remains red, continue Q 4hrs  Escalate  MEWS: Escalate Red: discuss with charge nurse/RN and provider, consider discussing with RRT  Notify: Charge Nurse/RN  Name of Charge Nurse/RN Notified Adolphus Birchwood, RN  Date Charge Nurse/RN Notified 01/16/22  Time Charge Nurse/RN Notified 2045  Provider Notification  Provider Name/Title Dr. Arlean Hopping  Date Provider Notified 01/16/22  Time Provider Notified 2100  Method of Notification Page  Notification Reason Other (Comment) (Red MEWS with elevated HR d/t A-fib/RVR)  Provider response See new orders  Date of Provider Response 01/16/22  Time of Provider Response 2115  Document  Patient Outcome Stabilized after interventions (Remains stable, but still A-fib/RVR)  Progress note created (see row info) Yes  Assess: SIRS CRITERIA  SIRS Temperature  0  SIRS Pulse 1  SIRS Respirations  1  SIRS WBC 1  SIRS Score Sum  3

## 2022-01-16 NOTE — Discharge Instructions (Addendum)
You were evaluated in the Emergency Department and after careful evaluation, we did not find any emergent condition requiring admission or further testing in the hospital.  Your exam/testing today is overall reassuring.  Use the Zofran as needed for nausea, use the Carafate as needed for upper burning abdominal pain, use the Bentyl as needed for lower cramping abdominal pain, use the Imodium as needed for diarrhea.  Recommend follow-up with your primary care doctor and/or the spine experts to discuss your symptoms.  Please return to the Emergency Department if you experience any worsening of your condition.   Thank you for allowing [place] to be a part of your care.

## 2022-01-16 NOTE — H&P (Addendum)
History and Physical    Patient: Charlotte Powell HWE:[id number] DOB: 1928-03-30 DOA: 01/15/2022 DOS: the patient was seen and examined on 01/16/2022 PCP: Ngetich, Donalee Citrin, NP  Patient coming from: [facility] via EMS  Chief Complaint:  Chief Complaint  Patient presents with   Abdominal Pain   HPI: [name] is a 88 y.o. female with medical history significant of hypertension, hyperlipidemia, atrial fibrillation on Eliquis, hypothyroidism, compression fractures of the back, and GERD who presents with complaints of right lower quadrant abdominal pain.  She has been dealing with similar type pain underneath her rib cage that was thought secondary to compression fractures of her back for quite some time.  She is set to see the spine specialist on 12/5 to discuss options.  Last night after eating dinner patient reported worsening pain in the right lower quadrant of her abdomen.  Noted associated symptoms of nausea, vomiting, and diarrhea.  Denies any blood in emesis or stools.  She had several episodes of both prior to coming to the hospital.  The patient notes that the was outbreak of COVID-19 at the facility, but denies any GI bug to her knowledge.  Her daughter notes that the patient last had antibiotics sometime in October, and there was concern for diverticulitis as a possible cause for her stomach pain.  Patient's daughter notes that the patient has been taking Tylenol for her back pain and getting around with use of a walker.  She had previously been given baclofen, but it made her significantly altered.  In the emergency department patient was noted to be afebrile with pulse elevated up to 127, respiration 14-30, blood pressure is maintained, and O2 saturations currently maintained on room air.  Labs noted BNP 244.8, high-sensitivity troponin 6, and platelets 123.  Influenza and COVID-19 screening were negative.  Chest x-ray noted unchanged left bibasilar atelectasis  with left-sided pleural effusion or pleural thickening.  CT scan of the abdomen pelvis noted persistent bilateral trace pleural effusions with passive atelectasis, colonic diverticulosis, and interval worsening of compression fractures of T7, T9, T11.  Stable compression fractures noted of T6, T4 8, T12, L1,  and L2 .  Patient was bolused 1 L normal saline IV fluids, antiemetics, metoprolol 5 mg IV, Imodium 2 g p.o.  Review of Systems: As mentioned in the history of present illness. All other systems reviewed and are negative. Past Medical History:  Diagnosis Date   Atrial fibrillation (HCC)    CAD (coronary artery disease)    Diverticulitis    GERD (gastroesophageal reflux disease)    Heart disease    HLD (hyperlipidemia)    HTN (hypertension)    Hypothyroidism    Vitamin D deficiency    Past Surgical History:  Procedure Laterality Date   ANKLE FRACTURE SURGERY     CHOLECYSTECTOMY     CORONARY STENT PLACEMENT     FEMUR FRACTURE SURGERY     WRIST FRACTURE SURGERY     Social History:  reports that she has never smoked. She has never used smokeless tobacco. She reports that she does not drink alcohol and does not use drugs.  Allergies  Allergen Reactions   Baclofen Other (See Comments)    hypoactive encephalopathy due to baclofen   Amoxicillin Other (See Comments)    unknown   Hydrocodone Other (See Comments)    Confusion and AMS    Family History  Problem Relation Age of Onset   Stomach cancer Neg Hx  Prior to Admission medications   Medication Sig Start Date End Date Taking? Authorizing Provider  acetaminophen (TYLENOL) 500 MG tablet Take 2 tablets (1,000 mg total) by mouth 3 (three) times daily. 12/03/21  Yes Linward Natal, MD  acetaminophen (TYLENOL) 500 MG tablet Take 1,000 mg by mouth every 8 (eight) hours as needed for fever (pain).   Yes [provider]  apixaban (ELIQUIS) 2.5 MG TABS tablet Take 1 tablet (2.5 mg total) by mouth 2 (two) times daily.  11/30/20  Yes Ghimire, Henreitta Leber, MD  Cholecalciferol 25 MCG (1000 UT) capsule Take 1,000 Units by mouth daily.   Yes [provider]  dicyclomine (BENTYL) 20 MG tablet Take 1 tablet (20 mg total) by mouth 2 (two) times daily. 01/16/22  Yes Maudie Flakes, MD  diltiazem (CARDIZEM) 30 MG tablet Take 1 tablet (30 mg total) by mouth 3 (three) times daily. 12/02/21 05/31/22 Yes Rick Duff, MD  fluticasone [facility]) 50 MCG/ACT nasal spray Place 1 spray into both nostrils daily. 08/07/20  Yes [provider]  folic acid (FOLVITE) Q000111Q MCG tablet Take 800 mcg by mouth daily.   Yes [provider]  gabapentin (NEURONTIN) 300 MG capsule Take 300 mg by mouth at bedtime.   Yes [provider]  levothyroxine (SYNTHROID) 25 MCG tablet Take 25 mcg by mouth daily.   Yes [provider]  lidocaine (LIDODERM) 5 % Place 1 patch onto the skin See admin instructions. Apply 1 patch to left shoulder daily. On for 2 hours and off for 12 hours   Yes [provider]  lidocaine (LIDODERM) 5 % Place 1 patch onto the skin every morning. Apply 1 patch to lower back every morning and remove 12 hours later.   Yes [provider]  lidocaine (XYLOCAINE) 5 % ointment Apply 5 g topically 2 (two) times daily as needed (pain). Apply to mid to lower back   Yes [provider]  loperamide (ANTI-DIARRHEAL) 2 MG tablet Take 2 mg by mouth every 6 (six) hours as needed for diarrhea or loose stools.   Yes [provider]  loperamide (IMODIUM) 2 MG capsule Take 1 capsule (2 mg total) by mouth 4 (four) times daily as needed for diarrhea or loose stools. 01/16/22  Yes Maudie Flakes, MD  melatonin 5 MG TABS Take 5 mg by mouth at bedtime.   Yes [provider]  Menthol, Topical Analgesic, (BIOFREEZE) 4 % GEL Apply 1 Application topically in the morning and at bedtime. Apply to left upper quadrant abdominal area liberally.   Yes [provider]   nitroGLYCERIN (NITROSTAT) 0.4 MG SL tablet Place 0.4 mg under the tongue every 5 (five) minutes as needed for chest pain. 08/07/20  Yes [provider]  omeprazole (PRILOSEC) 20 MG capsule Take 20 mg by mouth daily before breakfast.   Yes [provider]  ondansetron (ZOFRAN) 4 MG tablet Take 1 tablet (4 mg total) by mouth every 6 (six) hours. 01/15/22  Yes Curatolo, Adam, DO  phenylephrine-shark liver oil-mineral oil-petrolatum (PREPARATION H) 0.25-14-74.9 % rectal ointment Place 1 Application rectally 4 (four) times daily as needed for hemorrhoids (rectal pain).   Yes [provider]  Polyethylene Glycol 3350 POWD Take 17 g by mouth daily as needed (constipation).   Yes [provider]  rosuvastatin (CRESTOR) 20 MG tablet Take 1 tablet (20 mg total) by mouth daily. 12/03/21  Yes Rick Duff, MD  Saccharomyces boulardii (PROBIOTIC) 250 MG CAPS Take 250 mg by mouth  in the morning and at bedtime.   Yes [provider]  senna-docusate (SENEXON-S) 8.6-50 MG tablet Take 1 tablet by mouth 3 (three) times a week. Monday, Wednesday, and Friday at bedtime.   Yes [provider]  simethicone (MYLICON) 0000000 MG chewable tablet Chew 125 mg by mouth 4 (four) times daily as needed for flatulence.   Yes [provider]  sucralfate (CARAFATE) 1 g tablet Take 1 tablet (1 g total) by mouth 4 (four) times daily -  with meals and at bedtime for 14 days. 01/15/22 01/29/22 Yes Curatolo, Adam, DO  Psyllium (REGULOID) 400 MG CAPS Take 400 mg by mouth 2 (two) times daily. Patient not taking: Reported on 01/16/2022    [provider]  saccharomyces boulardii (FLORASTOR) 250 MG capsule Take 1 capsule (250 mg total) by mouth 2 (two) times daily. Patient not taking: Reported on 01/16/2022 02/12/21   Raiford Noble [place], DO    Physical Exam: Vitals:   01/16/22 [id number] 01/16/22 0738 01/16/22 0830 01/16/22 0943  BP:   (!) 103/93 123/82  Pulse: 80  98 97   Resp: (!) 26  20 19   Temp:  97.8 F (36.6 C)  98.3 F (36.8 C)  TempSrc:  Oral  Oral  SpO2: 95%  94% 96%  Exam  Constitutional: Elderly female currently in no acute distress Eyes: Asymmetric pupils left pupil smaller than right ENMT: Mucous membranes are moist.  Hard of hearing. Neck: normal, supple, no JVD Respiratory: clear to auscultation bilaterally, Normal respiratory effort. No accessory muscle use.  Cardiovascular: Irregularly irregular and tachycardic with heart rates into the 140s. No extremity edema. 2+ pedal pulses. Abdomen: Mild tenderness palpation over the right lower quadrant and flank.  Bowel sounds present. Musculoskeletal: no clubbing / cyanosis.  Decreased range of motion of the back with kyphosis present Skin: no rashes, lesions, ulcers. No induration Neurologic: CN 2-12 grossly intact. Strength 5/5 in all 4.  Psychiatric: Normal judgment and insight. Alert and oriented x 3. Normal mood.    Data Reviewed:  EKG revealed atrial fibrillation and 104 bpm.  Reviewed labs imaging and pertinent records as noted above in HPI  Assessment and Plan:  Nausea, vomiting, and diarrhea abdominal pain Acute.  Patient presents with complaints of right sided abdominal pain with nausea, vomiting, and diarrhea after eating dinner.  CT scan of the abdomen pelvis did not note any acute cause for the patient's symptoms.  Suspect likely gastroenteritis, but daughter notes that patient had recently been on antibiotics in October.  Patient had been given 1 L of IV fluids. -Admit to a telemetry bed -Aspiration precautions with elevation head of the bed -Check stool studies -Check urinalysis -Antiemetics as needed  Persistent atrial fibrillation on chronic anticoagulation Chronic.  Patient was noted to be in atrial fibrillation with heart rates elevated up to the 150s.  Patient on Cardizem 30 mg 3 times daily and Eliquis.  Overnight patient having given IV metoprolol. -Goal potassium  at least 4 and magnesium at least 2 -Continue Cardizem and Eliquis -Give 2.5 mg of Cardizem IV x 1 dose  Compression fractures of the spine CT noted interval worsening of compression fractures of T7, T9, and T11.  Stable compression fractures noted of T6, T4 8, T12, L1,  and L2.  Patient already has an appointment with spine specialist scheduled for 12/5. -Keep outpatient appointment -Continue Tylenol as needed for pain  Thrombocytopenia Acute.  Platelet count 123 on admission.  No reports of bleedin.g -Continue to monitor  Essential hypertension Blood pressures have been elevated up to 160/90. -Continue home blood pressure regimen  Hyperlipidemia -Continue Crestor  Hypothyroidism TSH 3.989 on 11/28/2021. -Continue levothyroxine  DNR   DVT prophylaxis: Eliquis Advance Care Planning:   Code Status: DNR   Consults: none  Family Communication: Daughter updated at bedside  Severity of Illness: The appropriate patient status for this patient is OBSERVATION. Observation status is judged to be reasonable and necessary in order to provide the required intensity of service to ensure the patient's safety. The patient's presenting symptoms, physical exam findings, and initial radiographic and laboratory data in the context of their medical condition is felt to place them at decreased risk for further clinical deterioration. Furthermore, it is anticipated that the patient will be medically stable for discharge from the hospital within 2 midnights of admission.   Author: Norval Morton, MD 01/16/2022 9:53 AM  For on call review [email or web address].

## 2022-01-16 NOTE — ED Notes (Signed)
Pt was all set to return to [facility] when she began vomiting again and had another watery stool and HR was between 80 and 150.  Dr Pilar Plate advised - pt staying.

## 2022-01-17 ENCOUNTER — Observation Stay (HOSPITAL_COMMUNITY): Payer: Medicare HMO

## 2022-01-17 DIAGNOSIS — Z9049 Acquired absence of other specified parts of digestive tract: Secondary | ICD-10-CM | POA: Diagnosis not present

## 2022-01-17 DIAGNOSIS — Z885 Allergy status to narcotic agent status: Secondary | ICD-10-CM | POA: Diagnosis not present

## 2022-01-17 DIAGNOSIS — Z955 Presence of coronary angioplasty implant and graft: Secondary | ICD-10-CM | POA: Diagnosis not present

## 2022-01-17 DIAGNOSIS — Z881 Allergy status to other antibiotic agents status: Secondary | ICD-10-CM | POA: Diagnosis not present

## 2022-01-17 DIAGNOSIS — K219 Gastro-esophageal reflux disease without esophagitis: Secondary | ICD-10-CM | POA: Diagnosis present

## 2022-01-17 DIAGNOSIS — R197 Diarrhea, unspecified: Secondary | ICD-10-CM | POA: Diagnosis present

## 2022-01-17 DIAGNOSIS — E860 Dehydration: Secondary | ICD-10-CM | POA: Diagnosis present

## 2022-01-17 DIAGNOSIS — Z66 Do not resuscitate: Secondary | ICD-10-CM | POA: Diagnosis present

## 2022-01-17 DIAGNOSIS — A080 Rotaviral enteritis: Secondary | ICD-10-CM | POA: Diagnosis present

## 2022-01-17 DIAGNOSIS — I2510 Atherosclerotic heart disease of native coronary artery without angina pectoris: Secondary | ICD-10-CM | POA: Diagnosis present

## 2022-01-17 DIAGNOSIS — E876 Hypokalemia: Secondary | ICD-10-CM | POA: Diagnosis present

## 2022-01-17 DIAGNOSIS — D696 Thrombocytopenia, unspecified: Secondary | ICD-10-CM | POA: Diagnosis present

## 2022-01-17 DIAGNOSIS — Z79890 Hormone replacement therapy (postmenopausal): Secondary | ICD-10-CM | POA: Diagnosis not present

## 2022-01-17 DIAGNOSIS — Z79899 Other long term (current) drug therapy: Secondary | ICD-10-CM | POA: Diagnosis not present

## 2022-01-17 DIAGNOSIS — R000 Tachycardia, unspecified: Secondary | ICD-10-CM | POA: Diagnosis present

## 2022-01-17 DIAGNOSIS — I4819 Other persistent atrial fibrillation: Secondary | ICD-10-CM | POA: Diagnosis present

## 2022-01-17 DIAGNOSIS — X58XXXA Exposure to other specified factors, initial encounter: Secondary | ICD-10-CM | POA: Diagnosis present

## 2022-01-17 DIAGNOSIS — I10 Essential (primary) hypertension: Secondary | ICD-10-CM | POA: Diagnosis present

## 2022-01-17 DIAGNOSIS — R112 Nausea with vomiting, unspecified: Secondary | ICD-10-CM | POA: Diagnosis not present

## 2022-01-17 DIAGNOSIS — J9811 Atelectasis: Secondary | ICD-10-CM | POA: Diagnosis present

## 2022-01-17 DIAGNOSIS — J90 Pleural effusion, not elsewhere classified: Secondary | ICD-10-CM | POA: Diagnosis present

## 2022-01-17 DIAGNOSIS — E7800 Pure hypercholesterolemia, unspecified: Secondary | ICD-10-CM | POA: Diagnosis present

## 2022-01-17 DIAGNOSIS — Z7901 Long term (current) use of anticoagulants: Secondary | ICD-10-CM | POA: Diagnosis not present

## 2022-01-17 DIAGNOSIS — E039 Hypothyroidism, unspecified: Secondary | ICD-10-CM | POA: Diagnosis present

## 2022-01-17 DIAGNOSIS — Z1152 Encounter for screening for COVID-19: Secondary | ICD-10-CM | POA: Diagnosis not present

## 2022-01-17 DIAGNOSIS — I4891 Unspecified atrial fibrillation: Secondary | ICD-10-CM

## 2022-01-17 DIAGNOSIS — M4854XA Collapsed vertebra, not elsewhere classified, thoracic region, initial encounter for fracture: Secondary | ICD-10-CM | POA: Diagnosis present

## 2022-01-17 LAB — CBC
HCT: 35.4 % — ABNORMAL LOW (ref 36.0–46.0)
Hemoglobin: 11.9 g/dL — ABNORMAL LOW (ref 12.0–15.0)
MCH: 35.1 pg — ABNORMAL HIGH (ref 26.0–34.0)
MCHC: 33.6 g/dL (ref 30.0–36.0)
MCV: 104.4 fL — ABNORMAL HIGH (ref 80.0–100.0)
Platelets: 108 10*3/uL — ABNORMAL LOW (ref 150–400)
RBC: 3.39 MIL/uL — ABNORMAL LOW (ref 3.87–5.11)
RDW: 14 % (ref 11.5–15.5)
WBC: 5.5 10*3/uL (ref 4.0–10.5)
nRBC: 0 % (ref 0.0–0.2)

## 2022-01-17 LAB — GASTROINTESTINAL PANEL BY PCR, STOOL (REPLACES STOOL CULTURE)

## 2022-01-17 LAB — ECHOCARDIOGRAM COMPLETE: S' Lateral: 2 cm

## 2022-01-17 LAB — BASIC METABOLIC PANEL
Anion gap: 6 (ref 5–15)
BUN: 17 mg/dL (ref 8–23)
CO2: 23 mmol/L (ref 22–32)
Calcium: 8.7 mg/dL — ABNORMAL LOW (ref 8.9–10.3)
Chloride: 107 mmol/L (ref 98–111)
Creatinine, Ser: 0.81 mg/dL (ref 0.44–1.00)
GFR, Estimated: >60 mL/min (ref >60)
Glucose, Bld: 104 mg/dL — ABNORMAL HIGH (ref 70–99)
Potassium: 3.4 mmol/L — ABNORMAL LOW (ref 3.5–5.1)
Sodium: 136 mmol/L (ref 135–145)

## 2022-01-17 MED ORDER — DILTIAZEM HCL 90 MG PO TABS
45.0000 mg | ORAL_TABLET | Freq: Four times a day (QID) | ORAL | Status: DC
  Administered 2022-01-17 – 2022-01-18 (×2): 45 mg via ORAL
  Filled 2022-01-17 (×3): qty 0.5

## 2022-01-17 MED ORDER — DILTIAZEM HCL 30 MG PO TABS
15.0000 mg | ORAL_TABLET | Freq: Once | ORAL | Status: DC
  Filled 2022-01-17: qty 0.5

## 2022-01-17 MED ORDER — DILTIAZEM HCL 60 MG PO TABS
30.0000 mg | ORAL_TABLET | Freq: Three times a day (TID) | ORAL | Status: DC
  Administered 2022-01-17 (×2): 30 mg via ORAL
  Filled 2022-01-17 (×2): qty 1

## 2022-01-17 MED ORDER — POTASSIUM CHLORIDE CRYS ER 20 MEQ PO TBCR
40.0000 meq | EXTENDED_RELEASE_TABLET | Freq: Once | ORAL | Status: AC
  Administered 2022-01-17: 40 meq via ORAL
  Filled 2022-01-17: qty 2

## 2022-01-17 MED ORDER — KCL-LACTATED RINGERS-D5W 20 MEQ/L IV SOLN: INTRAVENOUS | Status: DC

## 2022-01-17 MED ORDER — METOPROLOL TARTRATE 5 MG/5ML IV SOLN
5.0000 mg | INTRAVENOUS | Status: AC | PRN
  Administered 2022-01-17 – 2022-01-19 (×3): 5 mg via INTRAVENOUS
  Filled 2022-01-17 (×3): qty 5

## 2022-01-17 MED ORDER — PROCHLORPERAZINE EDISYLATE 10 MG/2ML IJ SOLN
10.0000 mg | Freq: Four times a day (QID) | INTRAMUSCULAR | Status: DC | PRN
  Administered 2022-01-17 (×2): 10 mg via INTRAVENOUS
  Filled 2022-01-17 (×2): qty 2

## 2022-01-17 MED ORDER — DILTIAZEM HCL 90 MG PO TABS
45.0000 mg | ORAL_TABLET | Freq: Four times a day (QID) | ORAL | Status: DC
  Filled 2022-01-17 (×2): qty 0.5

## 2022-01-17 MED ORDER — KCL-LACTATED RINGERS-D5W 20 MEQ/L IV SOLN
INTRAVENOUS | Status: AC
  Filled 2022-01-17: qty 1000

## 2022-01-17 NOTE — Progress Notes (Signed)
PROGRESS NOTE    Charlotte Powell  ZOX:[id number] DOB: Aug 22, 1928 DOA: 01/15/2022 PCP: Caesar Bookman, NP  Chief Complaint  Patient presents with   Abdominal Pain    Brief Narrative:   Charlotte Powell is Charlotte Powell 88 y.o. female with medical history significant of hypertension, hyperlipidemia, atrial fibrillation on Eliquis, hypothyroidism, compression fractures of the back, and GERD who presents with complaints of right lower quadrant abdominal pain.  She has been dealing with similar type pain underneath her rib cage that was thought secondary to compression fractures of her back for quite some time.  She is set to see the spine specialist on 12/5 to discuss options.  Last night after eating dinner patient reported worsening pain in the right lower quadrant of her abdomen.  Noted associated symptoms of nausea, vomiting, and diarrhea.  Denies any blood in emesis or stools.  She had several episodes of both prior to coming to the hospital.  The patient notes that the was outbreak of COVID-19 at the facility, but denies any GI bug to her knowledge.  Her daughter notes that the patient last had antibiotics sometime in October, and there was concern for diverticulitis as Aneli Zara possible cause for her stomach pain.  Patient's daughter notes that the patient has been taking Tylenol for her back pain and getting around with use of Elon Eoff walker.  She had previously been given baclofen, but it made her significantly altered.   In the emergency department patient was noted to be afebrile with pulse elevated up to 127, respiration 14-30, blood pressure is maintained, and O2 saturations currently maintained on room air.  Labs noted BNP 244.8, high-sensitivity troponin 6, and platelets 123.  Influenza and COVID-19 screening were negative.  Chest x-ray noted unchanged left bibasilar atelectasis with left-sided pleural effusion or pleural thickening.  CT scan of the abdomen pelvis noted persistent bilateral trace pleural effusions  with passive atelectasis, colonic diverticulosis, and interval worsening of compression fractures of T7, T9, T11.  Stable compression fractures noted of T6, T4 8, T12, L1,  and L2 .  Patient was bolused 1 L normal saline IV fluids, antiemetics, metoprolol 5 mg IV, Imodium 2 g p.o.  Assessment & Plan:   Principal Problem:   Nausea, vomiting, and diarrhea Active Problems:   Persistent atrial fibrillation (HCC)   Chronic anticoagulation   Compression fracture of spine (HCC)   Thrombocytopenia (HCC)   HTN (hypertension)   HLD (hyperlipidemia)   Hypothyroidism   DNR (do not resuscitate)/DNI(Do Not Intubate)   Nausea vomiting and diarrhea  Rotavirus Infection  Nausea, vomiting, and diarrhea  abdominal pain CT scan of the abdomen pelvis did not note any acute cause for the patient's symptoms.   Negative c diff GI pathogen panel with rotavirus Seems to be gradually improving, will follow  Persistent atrial fibrillation on chronic anticoagulation  Rapid Ventricular Response. - recurrent RVR today - IV metoprolol prn - increase diltiazem to 45 mg q6 hrs -Goal potassium at least 4 and magnesium at least 2 -Continue Cardizem and Eliquis   Compression fractures of the spine CT noted interval worsening of compression fractures of T7, T9, and T11.  Stable compression fractures noted of T6, T4 8, T12, L1,  and L2.  Patient already has an appointment with spine specialist scheduled for 12/5. -Keep outpatient appointment -Continue Tylenol as needed for pain   Hypokalemia Mild, follow  Thrombocytopenia Trend, will monitor   Essential hypertension -Continue home blood pressure regimen   Hyperlipidemia -Continue Crestor  Hypothyroidism TSH 3.989 on 11/28/2021. -Continue levothyroxine   DNR    DVT prophylaxis: eliquis Code Status: dnr Family Communication: duaghter at bedside Disposition:   Status is: Observation The patient will require care spanning > 2 midnights and  should be moved to inpatient because: need for inpatient care and rate control   Consultants:  none  Procedures:  Echo IMPRESSIONS     1. Limited study due to pt with N/V; only limited parasternal images  obtained; suggest fu study when pt able.   2. Left ventricular ejection fraction, by estimation, is 60 to 65%. The  left ventricle has normal function. The left ventricle has no regional  wall motion abnormalities. Left ventricular diastolic function could not  be evaluated.   3. Right ventricular systolic function was not well visualized. The right  ventricular size is not well visualized.   4. Left atrial size was moderately dilated.   5. The mitral valve is normal in structure. Mild mitral valve  regurgitation.   6. The aortic valve is tricuspid. Aortic valve regurgitation is not  visualized. Aortic valve sclerosis is present, with no evidence of aortic  valve stenosis.    Antimicrobials:  Anti-infectives (From admission, onward)    None       Subjective: C/o tiredness and fatigue Daughter at bedside  Objective: Vitals:   01/17/22 0854 01/17/22 1230 01/17/22 1257 01/17/22 1341  BP: (!) 108/51 139/73 131/75 127/73  Pulse: (!) 59 (!) 152 (!) 139 (!) 111  Resp: 16 16 (!) 22 (!) 27  Temp: 97.9 F (36.6 C) 98.4 F (36.9 C) 98.4 F (36.9 C)   TempSrc: Oral Oral Oral   SpO2: 97% 92% 94% 96%    Intake/Output Summary (Last 24 hours) at 01/17/2022 1818 Last data filed at 01/17/2022 1636 Gross per 24 hour  Intake 715.62 ml  Output 150 ml  Net 565.62 ml   There were no vitals filed for this visit.  Examination:  General exam: Appears fatigued and sleepy Respiratory system: unlabored Cardiovascular system: irregularly irregular Gastrointestinal system: Abdomen is nondistended, soft and nontender. Central nervous system: Alert and oriented. No focal neurological deficits. Extremities: no LEE    Data Reviewed: I have personally reviewed following labs and  imaging studies  CBC: Recent Labs  Lab 01/15/22 2116 01/17/22 0515  WBC 7.5 5.5  NEUTROABS 5.7  --   HGB 14.7 11.9*  HCT 43.0 35.4*  MCV 104.6* 104.4*  PLT 123* 108*    Basic Metabolic Panel: Recent Labs  Lab 01/15/22 2320 01/17/22 0515  NA 139 136  K 3.5 3.4*  CL 108 107  CO2 23 23  GLUCOSE 130* 104*  BUN 10 17  CREATININE 0.65 0.81  CALCIUM 9.0 8.7*    GFR: CrCl cannot be calculated (Unknown ideal weight.).  Liver Function Tests: Recent Labs  Lab 01/15/22 2320  AST 22  ALT 12  ALKPHOS 77  BILITOT 1.3*  PROT 6.3*  ALBUMIN 3.5    CBG: No results for input(s): "GLUCAP" in the last 168 hours.   Recent Results (from the past 240 hour(s))  Resp Panel by RT-PCR (Flu Verble Styron&B, Covid) Anterior Nasal Swab     Status: None   Collection Time: 01/15/22  9:30 PM   Specimen: Anterior Nasal Swab  Result Value Ref Range Status   SARS Coronavirus 2 by RT PCR NEGATIVE NEGATIVE Final    Comment: (NOTE) SARS-CoV-2 target nucleic acids are NOT DETECTED.  The SARS-CoV-2 RNA is generally detectable in upper respiratory  specimens during the acute phase of infection. The lowest concentration of SARS-CoV-2 viral copies this assay can detect is 138 copies/mL. Jesenya Bowditch negative result does not preclude SARS-Cov-2 infection and should not be used as the sole basis for treatment or other patient management decisions. Brittay Mogle negative result may occur with  improper specimen collection/handling, submission of specimen other than nasopharyngeal swab, presence of viral mutation(s) within the areas targeted by this assay, and inadequate number of viral copies(<138 copies/mL). Ola Fawver negative result must be combined with clinical observations, patient history, and epidemiological information. The expected result is Negative.  Fact Sheet for Patients:  EntrepreneurPulse.com.au  Fact Sheet for Healthcare Providers:  IncredibleEmployment.be  This test is no t yet  approved or cleared by the [place] FDA and  has been authorized for detection and/or diagnosis of SARS-CoV-2 by FDA under an Emergency Use Authorization (EUA). This EUA will remain  in effect (meaning this test can be used) for the duration of the COVID-19 declaration under Section 564(b)(1) of the Act, 21 U.S.C.section 360bbb-3(b)(1), unless the authorization is terminated  or revoked sooner.       Influenza Rojelio Uhrich by PCR NEGATIVE NEGATIVE Final   Influenza B by PCR NEGATIVE NEGATIVE Final    Comment: (NOTE) The Xpert Xpress SARS-CoV-2/FLU/RSV plus assay is intended as an aid in the diagnosis of influenza from Nasopharyngeal swab specimens and should not be used as Evelynne Spiers sole basis for treatment. Nasal washings and aspirates are unacceptable for Xpert Xpress SARS-CoV-2/FLU/RSV testing.  Fact Sheet for Patients: EntrepreneurPulse.com.au  Fact Sheet for Healthcare Providers: IncredibleEmployment.be  This test is not yet approved or cleared by the [place] FDA and has been authorized for detection and/or diagnosis of SARS-CoV-2 by FDA under an Emergency Use Authorization (EUA). This EUA will remain in effect (meaning this test can be used) for the duration of the COVID-19 declaration under Section 564(b)(1) of the Act, 21 U.S.C. section 360bbb-3(b)(1), unless the authorization is terminated or revoked.  Performed at [facility], [place] [address], [place], [place] [address]   Gastrointestinal Panel by PCR , Stool     Status: Abnormal   Collection Time: 01/16/22 12:27 PM   Specimen: Stool  Result Value Ref Range Status   Campylobacter species NOT DETECTED NOT DETECTED Final   Plesimonas shigelloides NOT DETECTED NOT DETECTED Final   Salmonella species NOT DETECTED NOT DETECTED Final   Yersinia enterocolitica NOT DETECTED NOT DETECTED Final   Vibrio species NOT DETECTED NOT DETECTED Final   Vibrio cholerae NOT DETECTED NOT DETECTED  Final   Enteroaggregative E coli (EAEC) NOT DETECTED NOT DETECTED Final   Enteropathogenic E coli (EPEC) NOT DETECTED NOT DETECTED Final   Enterotoxigenic E coli (ETEC) NOT DETECTED NOT DETECTED Final   Shiga like toxin producing E coli (STEC) NOT DETECTED NOT DETECTED Final   Shigella/Enteroinvasive E coli (EIEC) NOT DETECTED NOT DETECTED Final   Cryptosporidium NOT DETECTED NOT DETECTED Final   Cyclospora cayetanensis NOT DETECTED NOT DETECTED Final   Entamoeba histolytica NOT DETECTED NOT DETECTED Final   Giardia lamblia NOT DETECTED NOT DETECTED Final   Adenovirus F40/41 NOT DETECTED NOT DETECTED Final   Astrovirus NOT DETECTED NOT DETECTED Final   Norovirus GI/GII NOT DETECTED NOT DETECTED Final   Rotavirus Denvil Canning DETECTED (Byard Carranza) NOT DETECTED Final   Sapovirus (I, II, IV, and V) NOT DETECTED NOT DETECTED Final    Comment: Performed at [facility], [address], [place], [place] [address]  C Difficile Quick Screen w  PCR reflex     Status: None   Collection Time: 01/16/22 12:34 PM   Specimen: Stool  Result Value Ref Range Status   C Diff antigen NEGATIVE NEGATIVE Final   C Diff toxin NEGATIVE NEGATIVE Final   C Diff interpretation No C. difficile detected.  Final    Comment: Performed at [facility], [place] [address], [place], [place] [address]         Radiology Studies: ECHOCARDIOGRAM COMPLETE  Result Date: 01/17/2022    ECHOCARDIOGRAM REPORT   Patient Name:   NOZOMI MICKIEWICZ Date of Exam: 01/17/2022 Medical Rec #:  [id number]       Height:       63.0 in Accession #:    [id number]      Weight:       142.0 lb Date of Birth:  1928/03/31       BSA:          1.672 m Patient Age:    68 years        BP:           127/73 mmHg Patient Gender: F               HR:           103 bpm. Exam Location:  Inpatient Procedure: 2D Echo, Color Doppler and Cardiac Doppler Indications:    I48.91* Unspecified atrial fibrillation  History:        Patient has prior history of Echocardiogram  examinations. CAD,                 Arrythmias:Atrial Fibrillation; Risk Factors:Dyslipidemia and                 Hypertension.  Sonographer:    Greer Pickerel Referring Phys: [phone] Alvina Strother CALDWELL POWELL JR  Sonographer Comments: Unable to perform complete exam, terminated due to intractible vomiting and diarrhea. IMPRESSIONS  1. Limited study due to pt with N/V; only limited parasternal images obtained; suggest fu study when pt able.  2. Left ventricular ejection fraction, by estimation, is 60 to 65%. The left ventricle has normal function. The left ventricle has no regional wall motion abnormalities. Left ventricular diastolic function could not be evaluated.  3. Right ventricular systolic function was not well visualized. The right ventricular size is not well visualized.  4. Left atrial size was moderately dilated.  5. The mitral valve is normal in structure. Mild mitral valve regurgitation.  6. The aortic valve is tricuspid. Aortic valve regurgitation is not visualized. Aortic valve sclerosis is present, with no evidence of aortic valve stenosis. FINDINGS  Left Ventricle: Left ventricular ejection fraction, by estimation, is 60 to 65%. The left ventricle has normal function. The left ventricle has no regional wall motion abnormalities. The left ventricular internal cavity size was normal in size. There is  no left ventricular hypertrophy. Left ventricular diastolic function could not be evaluated due to atrial fibrillation. Left ventricular diastolic function could not be evaluated. Right Ventricle: The right ventricular size is not well visualized. Right vetricular wall thickness was not assessed. Right ventricular systolic function was not well visualized. Left Atrium: Left atrial size was moderately dilated. Right Atrium: Right atrial size was not assessed. Pericardium: There is no evidence of pericardial effusion. Presence of epicardial fat layer. Mitral Valve: The mitral valve is normal in structure. Mild mitral  valve regurgitation. Tricuspid Valve: The tricuspid valve is not well visualized. Tricuspid valve regurgitation is trivial. Aortic Valve: The aortic valve  is tricuspid. Aortic valve regurgitation is not visualized. Aortic valve sclerosis is present, with no evidence of aortic valve stenosis. Pulmonic Valve: The pulmonic valve was normal in structure. Pulmonic valve regurgitation is trivial. No evidence of pulmonic stenosis. Aorta: The aortic root is normal in size and structure. IAS/Shunts: The interatrial septum was not assessed. Additional Comments: Limited study due to pt with N/V; only limited parasternal images obtained; suggest fu study when pt able. There is Jamesha Ellsworth small pleural effusion in the left lateral region.  LEFT VENTRICLE PLAX 2D LVIDd:         2.80 cm LVIDs:         2.00 cm LV PW:         1.00 cm LV IVS:        1.10 cm LVOT diam:     1.70 cm LVOT Area:     2.27 cm  LEFT ATRIUM         Index LA diam:    4.50 cm 2.69 cm/m   AORTA Ao Root diam: 3.10 cm Ao Asc diam:  2.90 cm  SHUNTS Systemic Diam: 1.70 cm Kirk Ruths MD Electronically signed by Kirk Ruths MD Signature Date/Time: 01/17/2022/3:55:56 PM    Final    CT ABDOMEN PELVIS WO CONTRAST  Result Date: 01/15/2022 CLINICAL DATA:  Abdominal pain, acute, nonlocalized. Nausea and vomiting starting around 7pm with epigastric discomfort and generalized body pain. EXAM: CT ABDOMEN AND PELVIS WITHOUT CONTRAST TECHNIQUE: Multidetector CT imaging of the abdomen and pelvis was performed following the standard protocol without IV contrast. RADIATION DOSE REDUCTION: This exam was performed according to the departmental dose-optimization program which includes automated exposure control, adjustment of the mA and/or kV according to patient size and/or use of iterative reconstruction technique. COMPARISON:  CT abdomen pelvis 11/27/2021 FINDINGS: Lower chest: Persistent bilateral trace pleural effusions. Bilateral lower lobe subsegmental atelectasis.  Four-vessel coronary calcification. Tiny hiatal hernia. Hepatobiliary: No focal liver abnormality. Status post cholecystectomy. No biliary dilatation. Pancreas: Diffusely atrophic. No focal lesion. Otherwise normal pancreatic contour. No surrounding inflammatory changes. No main pancreatic ductal dilatation. Spleen: Normal in size without focal abnormality. Adrenals/Urinary Tract: No adrenal nodule bilaterally. No nephrolithiasis and no hydronephrosis. No definite contour-deforming renal mass. No ureterolithiasis or hydroureter. The urinary bladder is unremarkable. Stomach/Bowel: Stomach is within normal limits. No evidence of bowel wall thickening or dilatation. Colonic diverticulosis. Appendix appears normal. Vascular/Lymphatic: No abdominal aorta or iliac aneurysm. Severe atherosclerotic plaque of the aorta and its branches. No abdominal, pelvic, or inguinal lymphadenopathy. Reproductive: Uterus and bilateral adnexa are unremarkable. Other: No intraperitoneal free fluid. No intraperitoneal free gas. No organized fluid collection. Musculoskeletal: No abdominal wall hernia or abnormality. No suspicious lytic or blastic osseous lesions. No acute displaced fracture. Interval worsening of T7, T9, T11 compression fractures. Stable T6, T8, T12, L1, L2 compression fractures. Multilevel severe degenerative changes of the spine with multilevel intervertebral disc space vacuum phenomenon. Severe right L5-S1 osseous neural foraminal stenosis. Intramedullary nail fixation of the left proximal femur partially visualized. IMPRESSION: 1. Persistent bilateral trace pleural effusions with associated passive atelectasis. 2. Tiny hiatal hernia. 3. Colonic diverticulosis with no acute diverticulitis. 4. Interval worsening of T7, T9, T11 compression fractures. Stable T6, T8, T12, L1, L2 compression fractures. Correlate with point tenderness to palpation to evaluate for an acute component. 5. Severe multilevel degenerative changes of  the spine. 6.  Aortic Atherosclerosis (ICD10-I70.0). Electronically Signed   By: Iven Finn M.D.   On: 01/15/2022 22:22   DG Chest Portable  1 View  Result Date: 01/15/2022 CLINICAL DATA:  Epigastric discomfort and generalized body pain EXAM: PORTABLE CHEST 1 VIEW COMPARISON:  Radiograph 11/27/2021. FINDINGS: Stable cardiomediastinal silhouette. Aortic atherosclerotic calcification. Unchanged left basilar atelectasis and small left pleural effusion or pleural thickening. Right lung is clear. No pneumothorax. Demineralization. Advanced degenerative arthritis left shoulder. IMPRESSION: Unchanged left basilar atelectasis and left pleural effusion or pleural thickening. Electronically Signed   By: Placido Sou M.D.   On: 01/15/2022 21:45        Scheduled Meds:  acidophilus  1 capsule Oral BID   apixaban  2.5 mg Oral BID   diltiazem  30 mg Oral TID   fluticasone  1-2 spray Each Nare Daily   gabapentin  300 mg Oral QHS   levothyroxine  25 mcg Oral Q0600   melatonin  5 mg Oral QHS   pantoprazole  40 mg Oral Daily   rosuvastatin  20 mg Oral Daily   sodium chloride flush  3 mL Intravenous Q12H   Continuous Infusions:  dextrose 5% lactated ringers with KCl 20 mEq/L 75 mL/hr at 01/17/22 1146     LOS: 0 days   Time spent: over 30 min  Fayrene Helper, MD [facility]   To contact the attending provider between 7A-7P or the covering provider during after hours 7P-7A, please log into the web site [email or web address] and access using universal [place] password for that web site. If you do not have the password, please call the hospital operator.  01/17/2022, 6:18 PM

## 2022-01-17 NOTE — Plan of Care (Signed)

## 2022-01-17 NOTE — TOC Initial Note (Signed)
Transition of Care [facility]) - Initial/Assessment Note    Patient Details  Name: Charlotte Powell MRN: [id number] Date of Birth: [birth year]  [facility]) CM/SW Contact:    Delilah Shan, LCSWA Phone Number: 01/17/2022, 2:41 PM  Clinical Narrative:                  Patient with patients daughter [name] confirmed patient is from [facility], and plan is to return when medically ready for dc. CSW called [facility] and spoke with Lurena Joiner who confirmed they can accept patient tomorrow if medically ready. Lurena Joiner informed CSW to please email DC summary and FL2 to [email or web address]. Lurena Joiner informed CSW to Please call main line and ask for med tech. CSW will continue to follow and assist with patients dc planning needs.  Expected Discharge Plan: Assisted Living Barriers to Discharge: Continued Medical Work up   Patient Goals and CMS Choice Patient states their goals for this hospitalization and ongoing recovery are:: ALF CMS Medicare.gov Compare Post Acute Care list provided to::  (patient with patients daughter)    Expected Discharge Plan and Services Expected Discharge Plan: Assisted Living In-house Referral: Clinical Social Work     Living arrangements for the past 2 months: Assisted Living Facility                                      Prior Living Arrangements/Services Living arrangements for the past 2 months: Assisted Living Facility Lives with:: Facility Resident Patient language and need for interpreter reviewed:: Yes Do you feel safe going back to the place where you live?: Yes      Need for Family Participation in Patient Care: Yes (Comment) Care giver support system in place?: Yes (comment)   Criminal Activity/Legal Involvement Pertinent to Current Situation/Hospitalization: No - Comment as needed  Activities of Daily Living      Permission Sought/Granted Permission sought to share information with : Case Manager, Family  Supports, Magazine features editor Permission granted to share information with : Yes, Verbal Permission Granted  Share Information with NAME: [name]  Permission granted to share info w AGENCY: ALF  Permission granted to share info w Relationship: daughter  Permission granted to share info w Contact Information: [name] [phone]  Emotional Assessment Appearance:: Appears stated age Attitude/Demeanor/Rapport: Gracious Affect (typically observed): Calm Orientation: : Oriented to Self, Oriented to Place, Oriented to  Time, Oriented to Situation (WDL) Alcohol / Substance Use: Not Applicable Psych Involvement: No (comment)  Admission diagnosis:  Diarrhea of presumed infectious origin [R19.7] Nausea vomiting and diarrhea [R11.2, R19.7] Intractable nausea and vomiting [R11.2] Abdominal pain, unspecified abdominal location [R10.9] Acute low back pain without sciatica, unspecified back pain laterality [M54.50] Vomiting without nausea, unspecified vomiting type [R11.11] Patient Active Problem List   Diagnosis Date Noted   Nausea, vomiting, and diarrhea 01/16/2022   Compression fracture of spine (HCC) 01/16/2022   Thrombocytopenia (HCC) 01/16/2022   Hypothyroidism 01/16/2022   Nausea vomiting and diarrhea 01/16/2022   Acute exacerbation of chronic low back pain 11/30/2021   Chronic left shoulder pain 11/30/2021   Obtundation 11/28/2021   Medication adverse effect 11/27/2021   PAF (paroxysmal atrial fibrillation) (HCC) 04/22/2021   Chest pain of uncertain etiology 04/22/2021   Coronary artery disease involving native coronary artery of native heart without angina pectoris 04/22/2021   Chronic anticoagulation 04/22/2021   Atrial fibrillation with rapid ventricular response (HCC) 04/14/2021   Left upper  arm pain 04/14/2021   Secondary hypercoagulable state (HCC) 03/21/2021   Viral gastroenteritis 02/09/2021   DNR (do not resuscitate)/DNI(Do Not Intubate) 02/09/2021   HLD  (hyperlipidemia) 02/09/2021   CAD (coronary artery disease) 02/09/2021   Persistent atrial fibrillation (HCC) 11/16/2020   HTN (hypertension) 11/16/2020   PCP:  Caesar Bookman, NP Pharmacy:   [facility] [place], [place] - [address] [facility] [place] [place] [address] Phone: [phone] Fax: [phone]  [facility] - [place], [place] - [place] [address] [place] [address] Phone: [phone] Fax: [phone]     Social Determinants of Health (SDOH) Interventions    Readmission Risk Interventions     No data to display

## 2022-01-17 NOTE — Progress Notes (Signed)
PT Cancellation Note  Patient Details Name: Charlotte Powell MRN: [id number] DOB: [birth year]   Cancelled Treatment:    Reason Eval/Treat Not Completed: Per RN, pt with persistent bouts of diarrhea, unable to participate in PT Evaluation at this time. Will follow up as schedule permits.  Ina Homes, PT, DPT Acute Rehabilitation Services  Personal: [facility] Rehab Office: [phone]  Malachy Chamber 01/17/2022, 4:07 PM

## 2022-01-17 NOTE — Progress Notes (Signed)
IV metoprolol 5mg  administered, HR 130-150.

## 2022-01-17 NOTE — Care Management Obs Status (Signed)
MEDICARE OBSERVATION STATUS NOTIFICATION   Patient Details  Name: Charlotte Powell MRN: [id number] Date of Birth: [birth year]   Medicare Observation Status Notification Given:  Yes    Gala Lewandowsky, RN 01/17/2022, 3:22 PM

## 2022-01-17 NOTE — Progress Notes (Signed)
Echo stopped in progress, due to pt condition.

## 2022-01-18 DIAGNOSIS — R197 Diarrhea, unspecified: Secondary | ICD-10-CM | POA: Diagnosis not present

## 2022-01-18 DIAGNOSIS — R112 Nausea with vomiting, unspecified: Secondary | ICD-10-CM | POA: Diagnosis not present

## 2022-01-18 LAB — BASIC METABOLIC PANEL
Anion gap: 7 (ref 5–15)
BUN: 21 mg/dL (ref 8–23)
CO2: 22 mmol/L (ref 22–32)
Calcium: 9.1 mg/dL (ref 8.9–10.3)
Chloride: 107 mmol/L (ref 98–111)
Creatinine, Ser: 0.82 mg/dL (ref 0.44–1.00)
GFR, Estimated: >60 mL/min (ref >60)
Glucose, Bld: 137 mg/dL — ABNORMAL HIGH (ref 70–99)
Potassium: 4.5 mmol/L (ref 3.5–5.1)
Sodium: 136 mmol/L (ref 135–145)

## 2022-01-18 LAB — CBC
HCT: 33.7 % — ABNORMAL LOW (ref 36.0–46.0)
Hemoglobin: 11.6 g/dL — ABNORMAL LOW (ref 12.0–15.0)
MCH: 35.4 pg — ABNORMAL HIGH (ref 26.0–34.0)
MCHC: 34.4 g/dL (ref 30.0–36.0)
MCV: 102.7 fL — ABNORMAL HIGH (ref 80.0–100.0)
Platelets: 98 10*3/uL — ABNORMAL LOW (ref 150–400)
RBC: 3.28 MIL/uL — ABNORMAL LOW (ref 3.87–5.11)
RDW: 14 % (ref 11.5–15.5)
WBC: 4.5 10*3/uL (ref 4.0–10.5)
nRBC: 0 % (ref 0.0–0.2)

## 2022-01-18 LAB — CK: Total CK: 40 U/L (ref 38–234)

## 2022-01-18 LAB — PHOSPHORUS: Phosphorus: 2.5 mg/dL (ref 2.5–4.6)

## 2022-01-18 LAB — MAGNESIUM: Magnesium: 1.5 mg/dL — ABNORMAL LOW (ref 1.7–2.4)

## 2022-01-18 MED ORDER — DILTIAZEM HCL 60 MG PO TABS
30.0000 mg | ORAL_TABLET | Freq: Three times a day (TID) | ORAL | Status: DC
  Administered 2022-01-18 – 2022-01-19 (×3): 30 mg via ORAL
  Filled 2022-01-18 (×3): qty 1

## 2022-01-18 MED ORDER — K PHOS MONO-SOD PHOS DI & MONO 155-852-130 MG PO TABS: 500.0000 mg | ORAL_TABLET | Freq: Four times a day (QID) | ORAL | Status: DC

## 2022-01-18 MED ORDER — DILTIAZEM HCL 60 MG PO TABS: 30.0000 mg | ORAL_TABLET | Freq: Four times a day (QID) | ORAL | Status: DC

## 2022-01-18 MED ORDER — K PHOS MONO-SOD PHOS DI & MONO 155-852-130 MG PO TABS
500.0000 mg | ORAL_TABLET | Freq: Four times a day (QID) | ORAL | Status: AC
  Administered 2022-01-18 (×4): 500 mg via ORAL
  Filled 2022-01-18 (×4): qty 2

## 2022-01-18 NOTE — Evaluation (Signed)
Occupational Therapy Evaluation Patient Details Name: Charlotte Powell MRN: [id number] DOB: 1928-10-19 Today's Date: 01/18/2022   History of Present Illness Pt is a 88 y.o. female admitted from ALF on 01/15/22 with abdominal pain, N/V; workup for rotavirus. Abdominal CT showed persistent bilateral trace pleural effusions, chronic diverticulosis, compression fxs T6-L2. Of note, recent admission 11/2021 with toxic encephalopathy after starting baclofen. Other PMH includes afib, CAD, HTN, HLD, GERD, diverticulitis, compression fxs (pt has appt with spine specialist 12/5).   Clinical Impression   Pt from ALF, needs assist for ADLs and  uses RW for mobility. Pt currently needing min guard -mod A for ADLs, min A for bed mobility, and min guard-min A for transfers with RW. Pt reporting feeling generally "weak" compared to baseline. Pt presenting with impairments listed below, will follow acutely. Recommend HHOT at d/c.      Recommendations for follow up therapy are one component of a multi-disciplinary discharge planning process, led by the attending physician.  Recommendations may be updated based on patient status, additional functional criteria and insurance authorization.   Follow Up Recommendations  Home health OT     Assistance Recommended at Discharge Intermittent Supervision/Assistance  Patient can return home with the following A little help with walking and/or transfers;A lot of help with bathing/dressing/bathroom;Assistance with cooking/housework;Assist for transportation;Help with stairs or ramp for entrance;Direct supervision/assist for medications management;Direct supervision/assist for financial management    Functional Status Assessment  Patient has had a recent decline in their functional status and demonstrates the ability to make significant improvements in function in a reasonable and predictable amount of time.  Equipment Recommendations  None recommended by OT (pt has all  needed DME)    Recommendations for Other Services PT consult     Precautions / Restrictions Precautions Precautions: Fall;Other (comment) Precaution Comments: bladder/bowel urgency Restrictions Weight Bearing Restrictions: No      Mobility Bed Mobility Overal bed mobility: Needs Assistance Bed Mobility: Sidelying to Sit   Sidelying to sit: Min assist, HOB elevated       General bed mobility comments: min A for trunk elevation    Transfers Overall transfer level: Needs assistance Equipment used: Rolling walker (2 wheels) Transfers: Sit to/from Stand Sit to Stand: Min guard, Min assist           General transfer comment: min guard to stand from EOB to RW; minA for HHA to stand from low toilet height to RW      Balance Overall balance assessment: Needs assistance   Sitting balance-Leahy Scale: Fair Sitting balance - Comments: can reach minimally outside BOS without LOB   Standing balance support: Single extremity supported, Bilateral upper extremity supported, During functional activity Standing balance-Leahy Scale: Poor                             ADL either performed or assessed with clinical judgement   ADL Overall ADL's : Needs assistance/impaired Eating/Feeding: Supervision/ safety   Grooming: Min guard   Upper Body Bathing: Moderate assistance   Lower Body Bathing: Moderate assistance   Upper Body Dressing : Moderate assistance   Lower Body Dressing: Moderate assistance   Toilet Transfer: Min guard;Ambulation;Regular Toilet   Toileting- Architect and Hygiene: Moderate assistance       Functional mobility during ADLs: Min guard;Rolling walker (2 wheels)       Vision   Vision Assessment?: No apparent visual deficits     Perception Perception Perception Tested?: No  Praxis Praxis Praxis tested?: Not tested    Pertinent Vitals/Pain Pain Assessment Pain Assessment: No/denies pain     Hand Dominance Right    Extremity/Trunk Assessment Upper Extremity Assessment Upper Extremity Assessment: Generalized weakness LUE Deficits / Details: h/o chronic L shoulder impairment, flex/abd limited to <90'   Lower Extremity Assessment Lower Extremity Assessment: Generalized weakness   Cervical / Trunk Assessment Cervical / Trunk Assessment: Kyphotic (h/o compression fxs)   Communication Communication Communication: No difficulties   Cognition Arousal/Alertness: Awake/alert Behavior During Therapy: WFL for tasks assessed/performed Overall Cognitive Status: Within Functional Limits for tasks assessed                                 General Comments: WFL for simple tasks, expected age-related changes, suspect baseline     General Comments  VSS on RA; daughter present during session    Exercises     Shoulder Instructions      Home Living Family/patient expects to be discharged to:: Assisted living                             Home Equipment: Hand held shower head;Grab bars - tub/shower;Grab bars - toilet;Rolling Walker (2 wheels);BSC/3in1;Shower seat   Additional Comments: [facility] to provide assist for mobility and ADL/iADLs . daughter present to confirm PLOF      Prior Functioning/Environment Prior Level of Function : Needs assist             Mobility Comments: Typically mod indep ambulating with RW, was able to walk to dining hall until recent admission 11/2021, now staff pushes her in w/c outside of room and pt ambulates in room with RW; working with PT at ALF ADLs Comments: Typically mod indep with sponge bathing and toileting; assist from staff to shower, intermittent assist for dressing due to chronic L shoulder impairment        OT Problem List: Decreased strength;Decreased range of motion;Decreased activity tolerance;Impaired balance (sitting and/or standing);Impaired UE functional use      OT  Treatment/Interventions: Self-care/ADL training;Therapeutic exercise;DME and/or AE instruction;Energy conservation;Therapeutic activities;Patient/family education;Balance training    OT Goals(Current goals can be found in the care plan section) Acute Rehab OT Goals Patient Stated Goal: none stated OT Goal Formulation: With patient Time For Goal Achievement: 02/01/22 Potential to Achieve Goals: Good ADL Goals Pt Will Perform Upper Body Dressing: with min guard assist;sitting Pt Will Perform Lower Body Dressing: with min guard assist;sit to/from stand;sitting/lateral leans Pt Will Transfer to Toilet: with min guard assist;ambulating;regular height toilet Pt Will Perform Toileting - Clothing Manipulation and hygiene: with min guard assist;sitting/lateral leans;sit to/from stand Pt Will Perform Tub/Shower Transfer: with min guard assist;ambulating;shower seat;rolling walker  OT Frequency: Min 2X/week    Co-evaluation              AM-PAC OT "6 Clicks" Daily Activity     Outcome Measure Help from another person eating meals?: None Help from another person taking care of personal grooming?: A Little Help from another person toileting, which includes using toliet, bedpan, or urinal?: A Lot Help from another person bathing (including washing, rinsing, drying)?: A Lot Help from another person to put on and taking off regular upper body clothing?: A Little Help from another person to put on and taking off regular lower body clothing?: A Lot 6 Click Score: 16  End of Session Equipment Utilized During Treatment: Gait belt;Rolling walker (2 wheels) Nurse Communication: Mobility status  Activity Tolerance: Patient tolerated treatment well Patient left: in chair;with call bell/phone within reach;with chair alarm set;with family/visitor present  OT Visit Diagnosis: Unsteadiness on feet (R26.81);Other abnormalities of gait and mobility (R26.89);Muscle weakness (generalized) (M62.81)                 Time: 1000-1020 OT Time Calculation (min): 20 min Charges:  OT General Charges $OT Visit: 1 Visit OT Evaluation $OT Eval Moderate Complexity: 1 Mod  Payden Docter K, OTD, OTR/L [facility]erred Acute Rehab [phone]   Dameian Crisman K Koonce 01/18/2022, 10:29 AM

## 2022-01-18 NOTE — Progress Notes (Signed)
PROGRESS NOTE    Charlotte Powell  PZW:[id number] DOB: August 02, 1928 DOA: 01/15/2022 PCP: Charlotte Bookman, NP  Chief Complaint  Patient presents with   Abdominal Pain    Brief Narrative:   Charlotte Powell is Charlotte Powell 88 y.o. female with medical history significant of hypertension, hyperlipidemia, atrial fibrillation on Eliquis, hypothyroidism, compression fractures of the back, and GERD who presents with complaints of right lower quadrant abdominal pain.  She has been dealing with similar type pain underneath her rib cage that was thought secondary to compression fractures of her back for quite some time.  She is set to see the spine specialist on 12/5 to discuss options.  Last night after eating dinner patient reported worsening pain in the right lower quadrant of her abdomen.  Noted associated symptoms of nausea, vomiting, and diarrhea.  Denies any blood in emesis or stools.  She had several episodes of both prior to coming to the hospital.  The patient notes that the was outbreak of COVID-19 at the facility, but denies any GI bug to her knowledge.  Her daughter notes that the patient last had antibiotics sometime in October, and there was concern for diverticulitis as Brionne Mertz possible cause for her stomach pain.  Patient's daughter notes that the patient has been taking Tylenol for her back pain and getting around with use of Hazelynn Mckenny walker.  She had previously been given baclofen, but it made her significantly altered.   In the emergency department patient was noted to be afebrile with pulse elevated up to 127, respiration 14-30, blood pressure is maintained, and O2 saturations currently maintained on room air.  Labs noted BNP 244.8, high-sensitivity troponin 6, and platelets 123.  Influenza and COVID-19 screening were negative.  Chest x-ray noted unchanged left bibasilar atelectasis with left-sided pleural effusion or pleural thickening.  CT scan of the abdomen pelvis noted persistent bilateral trace pleural effusions  with passive atelectasis, colonic diverticulosis, and interval worsening of compression fractures of T7, T9, T11.  Stable compression fractures noted of T6, T4 8, T12, L1,  and L2 .  Patient was bolused 1 L normal saline IV fluids, antiemetics, metoprolol 5 mg IV, Imodium 2 g p.o.  Assessment & Plan:   Principal Problem:   Nausea, vomiting, and diarrhea Active Problems:   Persistent atrial fibrillation (HCC)   Chronic anticoagulation   Compression fracture of spine (HCC)   Thrombocytopenia (HCC)   HTN (hypertension)   HLD (hyperlipidemia)   Hypothyroidism   DNR (do not resuscitate)/DNI(Do Not Intubate)   Nausea vomiting and diarrhea   Rotavirus infection  Rotavirus Infection  Nausea, vomiting, and diarrhea  abdominal pain CT scan of the abdomen pelvis did not note any acute cause for the patient's symptoms.   Negative c diff GI pathogen panel with rotavirus Seems to be gradually improving, will follow  Persistent atrial fibrillation on chronic anticoagulation  Rapid Ventricular Response. - recurrent RVR today - IV metoprolol prn - having periods of brady, will back off on dilt, back to home dose - limited echo with EF 60-65%, no RWMA - recommended follow up study when she's able -Continue Cardizem and Eliquis   Compression fractures of the spine CT noted interval worsening of compression fractures of T7, T9, and T11.  Stable compression fractures noted of T6, T4 8, T12, L1,  and L2.  Patient already has an appointment with spine specialist scheduled for 12/5. -Keep outpatient appointment -Continue Tylenol as needed for pain   Hypokalemia Mild, follow  Thrombocytopenia Trend, will monitor  Essential hypertension -Continue home blood pressure regimen   Hyperlipidemia -Continue Crestor   Hypothyroidism TSH 3.989 on 11/28/2021. -Continue levothyroxine   DNR    DVT prophylaxis: eliquis Code Status: dnr Family Communication: duaghter at bedside Disposition:    Status is: Observation The patient will require care spanning > 2 midnights and should be moved to inpatient because: need for inpatient care and rate control   Consultants:  none  Procedures:  Echo IMPRESSIONS     1. Limited study due to pt with N/V; only limited parasternal images  obtained; suggest fu study when pt able.   2. Left ventricular ejection fraction, by estimation, is 60 to 65%. The  left ventricle has normal function. The left ventricle has no regional  wall motion abnormalities. Left ventricular diastolic function could not  be evaluated.   3. Right ventricular systolic function was not well visualized. The right  ventricular size is not well visualized.   4. Left atrial size was moderately dilated.   5. The mitral valve is normal in structure. Mild mitral valve  regurgitation.   6. The aortic valve is tricuspid. Aortic valve regurgitation is not  visualized. Aortic valve sclerosis is present, with no evidence of aortic  valve stenosis.    Antimicrobials:  Anti-infectives (From admission, onward)    None       Subjective: C/o tiredness and fatigue Daughter at bedside  Objective: Vitals:   01/17/22 2145 01/18/22 0112 01/18/22 0441 01/18/22 1212  BP: 113/64 118/60 (!) 128/54 129/85  Pulse:  (!) 59 77 (!) 110  Resp:  19 20 19   Temp:  97.6 F (36.4 C) 97.6 F (36.4 C) 97.8 F (36.6 C)  TempSrc:  Oral Oral Oral  SpO2:  97% 97% 98%    Intake/Output Summary (Last 24 hours) at 01/18/2022 [id number] Last data filed at 01/18/2022 1647 Gross per 24 hour  Intake --  Output 1350 ml  Net -1350 ml   There were no vitals filed for this visit.  Examination:  General: No acute distress.  Still sleepy today, but looks better overall. Cardiovascular: irregularly irregular, brady at times to the 50's, rarely 40s Lungs: unlabored Abdomen: Soft, nontender, nondistended Neurological: Alert . Moves all extremities 4 . Cranial nerves II through XII grossly  intact. Extremities: No clubbing or cyanosis. No edema.   Data Reviewed: I have personally reviewed following labs and imaging studies  CBC: Recent Labs  Lab 01/15/22 2116 01/17/22 0515 01/18/22 0137  WBC 7.5 5.5 4.5  NEUTROABS 5.7  --   --   HGB 14.7 11.9* 11.6*  HCT 43.0 35.4* 33.7*  MCV 104.6* 104.4* 102.7*  PLT 123* 108* 98*    Basic Metabolic Panel: Recent Labs  Lab 01/15/22 2320 01/17/22 0515 01/18/22 0137  NA 139 136 136  K 3.5 3.4* 4.5  CL 108 107 107  CO2 23 23 22   GLUCOSE 130* 104* 137*  BUN 10 17 21   CREATININE 0.65 0.81 0.82  CALCIUM 9.0 8.7* 9.1  MG  --   --  1.5*  PHOS  --   --  2.5    GFR: CrCl cannot be calculated (Unknown ideal weight.).  Liver Function Tests: Recent Labs  Lab 01/15/22 2320  AST 22  ALT 12  ALKPHOS 77  BILITOT 1.3*  PROT 6.3*  ALBUMIN 3.5    CBG: No results for input(s): "GLUCAP" in the last 168 hours.   Recent Results (from the past 240 hour(s))  Resp Panel by RT-PCR (Flu Pachia Strum&B,  Covid) Anterior Nasal Swab     Status: None   Collection Time: 01/15/22  9:30 PM   Specimen: Anterior Nasal Swab  Result Value Ref Range Status   SARS Coronavirus 2 by RT PCR NEGATIVE NEGATIVE Final    Comment: (NOTE) SARS-CoV-2 target nucleic acids are NOT DETECTED.  The SARS-CoV-2 RNA is generally detectable in upper respiratory specimens during the acute phase of infection. The lowest concentration of SARS-CoV-2 viral copies this assay can detect is 138 copies/mL. Maher Shon negative result does not preclude SARS-Cov-2 infection and should not be used as the sole basis for treatment or other patient management decisions. Yesika Rispoli negative result may occur with  improper specimen collection/handling, submission of specimen other than nasopharyngeal swab, presence of viral mutation(s) within the areas targeted by this assay, and inadequate number of viral copies(<138 copies/mL). Nithila Sumners negative result must be combined with clinical observations, patient  history, and epidemiological information. The expected result is Negative.  Fact Sheet for Patients:  EntrepreneurPulse.com.au  Fact Sheet for Healthcare Providers:  IncredibleEmployment.be  This test is no t yet approved or cleared by the [place] FDA and  has been authorized for detection and/or diagnosis of SARS-CoV-2 by FDA under an Emergency Use Authorization (EUA). This EUA will remain  in effect (meaning this test can be used) for the duration of the COVID-19 declaration under Section 564(b)(1) of the Act, 21 U.S.C.section 360bbb-3(b)(1), unless the authorization is terminated  or revoked sooner.       Influenza Kayzlee Wirtanen by PCR NEGATIVE NEGATIVE Final   Influenza B by PCR NEGATIVE NEGATIVE Final    Comment: (NOTE) The Xpert Xpress SARS-CoV-2/FLU/RSV plus assay is intended as an aid in the diagnosis of influenza from Nasopharyngeal swab specimens and should not be used as Elin Fenley sole basis for treatment. Nasal washings and aspirates are unacceptable for Xpert Xpress SARS-CoV-2/FLU/RSV testing.  Fact Sheet for Patients: EntrepreneurPulse.com.au  Fact Sheet for Healthcare Providers: IncredibleEmployment.be  This test is not yet approved or cleared by the [place] FDA and has been authorized for detection and/or diagnosis of SARS-CoV-2 by FDA under an Emergency Use Authorization (EUA). This EUA will remain in effect (meaning this test can be used) for the duration of the COVID-19 declaration under Section 564(b)(1) of the Act, 21 U.S.C. section 360bbb-3(b)(1), unless the authorization is terminated or revoked.  Performed at [facility], [place] [address], [place], [place] [address]   Gastrointestinal Panel by PCR , Stool     Status: Abnormal   Collection Time: 01/16/22 12:27 PM   Specimen: Stool  Result Value Ref Range Status   Campylobacter species NOT DETECTED NOT DETECTED Final   Plesimonas  shigelloides NOT DETECTED NOT DETECTED Final   Salmonella species NOT DETECTED NOT DETECTED Final   Yersinia enterocolitica NOT DETECTED NOT DETECTED Final   Vibrio species NOT DETECTED NOT DETECTED Final   Vibrio cholerae NOT DETECTED NOT DETECTED Final   Enteroaggregative E coli (EAEC) NOT DETECTED NOT DETECTED Final   Enteropathogenic E coli (EPEC) NOT DETECTED NOT DETECTED Final   Enterotoxigenic E coli (ETEC) NOT DETECTED NOT DETECTED Final   Shiga like toxin producing E coli (STEC) NOT DETECTED NOT DETECTED Final   Shigella/Enteroinvasive E coli (EIEC) NOT DETECTED NOT DETECTED Final   Cryptosporidium NOT DETECTED NOT DETECTED Final   Cyclospora cayetanensis NOT DETECTED NOT DETECTED Final   Entamoeba histolytica NOT DETECTED NOT DETECTED Final   Giardia lamblia NOT DETECTED NOT DETECTED Final   Adenovirus F40/41 NOT DETECTED NOT  DETECTED Final   Astrovirus NOT DETECTED NOT DETECTED Final   Norovirus GI/GII NOT DETECTED NOT DETECTED Final   Rotavirus Eziah Negro DETECTED (Dmitriy Gair) NOT DETECTED Final   Sapovirus (I, II, IV, and V) NOT DETECTED NOT DETECTED Final    Comment: Performed at [facility], [address], [place], [place] [address]  C Difficile Quick Screen w PCR reflex     Status: None   Collection Time: 01/16/22 12:34 PM   Specimen: Stool  Result Value Ref Range Status   C Diff antigen NEGATIVE NEGATIVE Final   C Diff toxin NEGATIVE NEGATIVE Final   C Diff interpretation No C. difficile detected.  Final    Comment: Performed at [facility], [place] [address], [place], [place] [address]         Radiology Studies: ECHOCARDIOGRAM COMPLETE  Result Date: 01/17/2022    ECHOCARDIOGRAM REPORT   Patient Name:   SHURON CASSITY Date of Exam: 01/17/2022 Medical Rec #:  [id number]       Height:       63.0 in Accession #:    [id number]      Weight:       142.0 lb Date of Birth:  1928/05/19       BSA:          1.672 m Patient Age:    46 years        BP:           127/73 mmHg  Patient Gender: F               HR:           103 bpm. Exam Location:  Inpatient Procedure: 2D Echo, Color Doppler and Cardiac Doppler Indications:    I48.91* Unspecified atrial fibrillation  History:        Patient has prior history of Echocardiogram examinations. CAD,                 Arrythmias:Atrial Fibrillation; Risk Factors:Dyslipidemia and                 Hypertension.  Sonographer:    Greer Pickerel Referring Phys: [id number] Macrina Lehnert CALDWELL POWELL JR  Sonographer Comments: Unable to perform complete exam, terminated due to intractible vomiting and diarrhea. IMPRESSIONS  1. Limited study due to pt with N/V; only limited parasternal images obtained; suggest fu study when pt able.  2. Left ventricular ejection fraction, by estimation, is 60 to 65%. The left ventricle has normal function. The left ventricle has no regional wall motion abnormalities. Left ventricular diastolic function could not be evaluated.  3. Right ventricular systolic function was not well visualized. The right ventricular size is not well visualized.  4. Left atrial size was moderately dilated.  5. The mitral valve is normal in structure. Mild mitral valve regurgitation.  6. The aortic valve is tricuspid. Aortic valve regurgitation is not visualized. Aortic valve sclerosis is present, with no evidence of aortic valve stenosis. FINDINGS  Left Ventricle: Left ventricular ejection fraction, by estimation, is 60 to 65%. The left ventricle has normal function. The left ventricle has no regional wall motion abnormalities. The left ventricular internal cavity size was normal in size. There is  no left ventricular hypertrophy. Left ventricular diastolic function could not be evaluated due to atrial fibrillation. Left ventricular diastolic function could not be evaluated. Right Ventricle: The right ventricular size is not well visualized. Right vetricular wall thickness was not assessed. Right ventricular systolic function was not well  visualized. Left  Atrium: Left atrial size was moderately dilated. Right Atrium: Right atrial size was not assessed. Pericardium: There is no evidence of pericardial effusion. Presence of epicardial fat layer. Mitral Valve: The mitral valve is normal in structure. Mild mitral valve regurgitation. Tricuspid Valve: The tricuspid valve is not well visualized. Tricuspid valve regurgitation is trivial. Aortic Valve: The aortic valve is tricuspid. Aortic valve regurgitation is not visualized. Aortic valve sclerosis is present, with no evidence of aortic valve stenosis. Pulmonic Valve: The pulmonic valve was normal in structure. Pulmonic valve regurgitation is trivial. No evidence of pulmonic stenosis. Aorta: The aortic root is normal in size and structure. IAS/Shunts: The interatrial septum was not assessed. Additional Comments: Limited study due to pt with N/V; only limited parasternal images obtained; suggest fu study when pt able. There is Colburn Asper small pleural effusion in the left lateral region.  LEFT VENTRICLE PLAX 2D LVIDd:         2.80 cm LVIDs:         2.00 cm LV PW:         1.00 cm LV IVS:        1.10 cm LVOT diam:     1.70 cm LVOT Area:     2.27 cm  LEFT ATRIUM         Index LA diam:    4.50 cm 2.69 cm/m   AORTA Ao Root diam: 3.10 cm Ao Asc diam:  2.90 cm  SHUNTS Systemic Diam: 1.70 cm Kirk Ruths MD Electronically signed by Kirk Ruths MD Signature Date/Time: 01/17/2022/3:55:56 PM    Final         Scheduled Meds:  acidophilus  1 capsule Oral BID   apixaban  2.5 mg Oral BID   diltiazem  15 mg Oral Once   diltiazem  30 mg Oral Q8H   fluticasone  1-2 spray Each Nare Daily   gabapentin  300 mg Oral QHS   levothyroxine  25 mcg Oral Q0600   melatonin  5 mg Oral QHS   pantoprazole  40 mg Oral Daily   phosphorus  500 mg Oral QID   rosuvastatin  20 mg Oral Daily   sodium chloride flush  3 mL Intravenous Q12H   Continuous Infusions:     LOS: 1 day   Time spent: over 30 min  Fayrene Helper, MD [facility]   To contact the attending provider between 7A-7P or the covering provider during after hours 7P-7A, please log into the web site [email or web address] and access using universal [place] password for that web site. If you do not have the password, please call the hospital operator.  01/18/2022, 6:52 PM

## 2022-01-18 NOTE — Evaluation (Signed)
Physical Therapy Evaluation Patient Details Name: Charlotte Powell MRN: [id number] DOB: Jun 13, 1928 Today's Date: 01/18/2022  History of Present Illness  Pt is a 88 y.o. female admitted from ALF on 01/15/22 with abdominal pain, N/V; workup for rotavirus. Abdominal CT showed persistent bilateral trace pleural effusions, chronic diverticulosis, compression fxs T6-L2. Of note, recent admission 11/2021 with toxic encephalopathy after starting baclofen. Other PMH includes afib, CAD, HTN, HLD, GERD, diverticulitis, compression fxs (pt has appt with spine specialist 12/5).   Clinical Impression  Pt presents with an overall decrease in functional mobility secondary to above. PTA, pt resident at [facility] where staff assists with mobility and ADL/iADLs as needed, pt ambulating short distances with RW and has been working with PT there since recent admission 11/2021. Today, pt moving fairly well with RW and intermittent minA; 1x bout diarrhea this session. Pt's daughter present and supportive, reports staff able to provide necessary assist at ALF. If pt to remain admitted, will continue to follow acutely to address established goals.    Recommendations for follow up therapy are one component of a multi-disciplinary discharge planning process, led by the attending physician.  Recommendations may be updated based on patient status, additional functional criteria and insurance authorization.  Follow Up Recommendations Home health PT      Assistance Recommended at Discharge Intermittent Supervision/Assistance  Patient can return home with the following  A little help with walking and/or transfers;A little help with bathing/dressing/bathroom;Assistance with cooking/housework;Assist for transportation;Help with stairs or ramp for entrance    Equipment Recommendations None recommended by PT  Recommendations for Other Services   Mobility Specialist   Functional Status Assessment Patient has had a recent decline in  their functional status and demonstrates the ability to make significant improvements in function in a reasonable and predictable amount of time.     Precautions / Restrictions Precautions Precautions: Fall;Other (comment) Precaution Comments: bladder/bowel urgency Restrictions Weight Bearing Restrictions: No      Mobility  Bed Mobility Overal bed mobility: Needs Assistance Bed Mobility: Supine to Sit, Sit to Supine     Supine to sit: Min assist, HOB elevated Sit to supine: Min assist   General bed mobility comments: minA for HHA to elevate trunk but likely not needed, pt scooting self to EOB without assist; minA for BLE management return to supine in flat bed    Transfers Overall transfer level: Needs assistance Equipment used: Rolling walker (2 wheels) Transfers: Sit to/from Stand Sit to Stand: Min guard, Min assist           General transfer comment: min guard to stand from EOB to RW; minA for HHA to stand from low toilet height to RW    Ambulation/Gait Ambulation/Gait assistance: Min guard Gait Distance (Feet): 24 Feet Assistive device: Rolling walker (2 wheels) Gait Pattern/deviations: Step-through pattern, Decreased stride length, Trunk flexed Gait velocity: Decreased     General Gait Details: slow, mostly steady gait with RW and intermittent min guard for balance; pt navigating in/out of small bathroom with RW well despite urgency to void  Stairs            Wheelchair Mobility    Modified Rankin (Stroke Patients Only)       Balance Overall balance assessment: Needs assistance   Sitting balance-Leahy Scale: Fair     Standing balance support: Single extremity supported, Bilateral upper extremity supported, During functional activity Standing balance-Leahy Scale: Poor Standing balance comment: pt requesting assist for posterior pericare due to feeling unsteady  Pertinent Vitals/Pain Pain  Assessment Pain Assessment: No/denies pain    Home Living Family/patient expects to be discharged to:: Assisted living                 Home Equipment: Hand held shower head;Grab bars - tub/shower;Grab bars - toilet;Rolling Walker (2 wheels);BSC/3in1 Additional Comments: Resident at [facility] staff is able to provide assist for mobility and ADL/iADLs . daughter present to confirm PLOF    Prior Function Prior Level of Function : Needs assist             Mobility Comments: Typically mod indep ambulating with RW, was able to walk to dining hall until recent admission 11/2021, now staff pushes her in w/c outside of room and pt ambulates in room with RW; working with PT at ALF ADLs Comments: Typically mod indep with sponge bathing and toileting; assist from staff to shower, intermittent assist for dressing due to chronic L shoulder impairment     Hand Dominance   Dominant Hand: Right    Extremity/Trunk Assessment   Upper Extremity Assessment Upper Extremity Assessment: Generalized weakness;LUE deficits/detail LUE Deficits / Details: h/o chronic L shoulder impairment, flex/abd limited to <90'    Lower Extremity Assessment Lower Extremity Assessment: Generalized weakness    Cervical / Trunk Assessment Cervical / Trunk Assessment: Kyphotic (h/o chronic compression fxs)  Communication   Communication: No difficulties  Cognition Arousal/Alertness: Awake/alert Behavior During Therapy: WFL for tasks assessed/performed Overall Cognitive Status: Within Functional Limits for tasks assessed                                 General Comments: WFL for simple tasks, expected age-related changes, suspect baseline        General Comments General comments (skin integrity, edema, etc.): SPO2 >/94% on RA, post-mobility BP 133/80. Pt's daughter Charlotte Powell) present and supportive. pt with bout of diarrhea during session, pt notes improved from yesterday  though    Exercises     Assessment/Plan    PT Assessment Patient needs continued PT services  PT Problem List Decreased strength;Decreased activity tolerance;Decreased balance;Decreased mobility       PT Treatment Interventions DME instruction;Gait training;Functional mobility training;Therapeutic activities;Therapeutic exercise;Balance training;Patient/family education    PT Goals (Current goals can be found in the Care Plan section)  Acute Rehab PT Goals Patient Stated Goal: return home to ALF, pt does not want to have to go to SNF PT Goal Formulation: With patient/family Time For Goal Achievement: 02/01/22 Potential to Achieve Goals: Good    Frequency Min 3X/week     Co-evaluation               AM-PAC PT "6 Clicks" Mobility  Outcome Measure Help needed turning from your back to your side while in a flat bed without using bedrails?: A Little Help needed moving from lying on your back to sitting on the side of a flat bed without using bedrails?: A Little Help needed moving to and from a bed to a chair (including a wheelchair)?: A Little Help needed standing up from a chair using your arms (e.g., wheelchair or bedside chair)?: A Little Help needed to walk in hospital room?: A Little Help needed climbing 3-5 steps with a railing? : A Little 6 Click Score: 18    End of Session Equipment Utilized During Treatment: Gait belt Activity Tolerance: Patient tolerated treatment well Patient left: in bed;with call bell/phone within reach;with  family/visitor present Nurse Communication: Mobility status PT Visit Diagnosis: Other abnormalities of gait and mobility (R26.89);Muscle weakness (generalized) (M62.81)    Time: 7121-9758 PT Time Calculation (min) (ACUTE ONLY): 26 min   Charges:   PT Evaluation $PT Eval Moderate Complexity: 1 Mod PT Treatments $Therapeutic Activity: 8-22 mins      Ina Homes, PT, DPT Acute Rehabilitation Services  Personal: Secure  Chat Rehab Office: [phone]  Malachy Chamber 01/18/2022, 10:06 AM

## 2022-01-18 NOTE — Plan of Care (Signed)

## 2022-01-19 DIAGNOSIS — R112 Nausea with vomiting, unspecified: Secondary | ICD-10-CM | POA: Diagnosis not present

## 2022-01-19 DIAGNOSIS — R197 Diarrhea, unspecified: Secondary | ICD-10-CM | POA: Diagnosis not present

## 2022-01-19 LAB — BASIC METABOLIC PANEL
Anion gap: 9 (ref 5–15)
BUN: 13 mg/dL (ref 8–23)
CO2: 25 mmol/L (ref 22–32)
Calcium: 8.5 mg/dL — ABNORMAL LOW (ref 8.9–10.3)
Chloride: 104 mmol/L (ref 98–111)
Creatinine, Ser: 0.69 mg/dL (ref 0.44–1.00)
GFR, Estimated: >60 mL/min (ref >60)
Glucose, Bld: 116 mg/dL — ABNORMAL HIGH (ref 70–99)
Potassium: 3.3 mmol/L — ABNORMAL LOW (ref 3.5–5.1)
Sodium: 138 mmol/L (ref 135–145)

## 2022-01-19 LAB — MAGNESIUM: Magnesium: 1.5 mg/dL — ABNORMAL LOW (ref 1.7–2.4)

## 2022-01-19 MED ORDER — MAGNESIUM SULFATE 4 GM/100ML IV SOLN
4.0000 g | Freq: Once | INTRAVENOUS | Status: AC
  Administered 2022-01-19: 4 g via INTRAVENOUS
  Filled 2022-01-19: qty 100

## 2022-01-19 MED ORDER — POTASSIUM CHLORIDE 2 MEQ/ML IV SOLN
INTRAVENOUS | Status: DC
  Filled 2022-01-19 (×4): qty 1000

## 2022-01-19 MED ORDER — LACTATED RINGERS IV BOLUS
250.0000 mL | Freq: Once | INTRAVENOUS | Status: AC
  Administered 2022-01-19: 250 mL via INTRAVENOUS

## 2022-01-19 MED ORDER — DILTIAZEM HCL 60 MG PO TABS
30.0000 mg | ORAL_TABLET | Freq: Four times a day (QID) | ORAL | Status: DC
  Administered 2022-01-19 – 2022-01-21 (×8): 30 mg via ORAL
  Filled 2022-01-19 (×8): qty 1

## 2022-01-19 MED ORDER — KCL-LACTATED RINGERS 20 MEQ/L IV SOLN
INTRAVENOUS | Status: DC
  Filled 2022-01-19 (×3): qty 1000

## 2022-01-19 MED ORDER — METOPROLOL TARTRATE 5 MG/5ML IV SOLN: 5.0000 mg | INTRAVENOUS | Status: DC | PRN

## 2022-01-19 MED ORDER — SODIUM CHLORIDE 0.9 % IV SOLN: INTRAVENOUS | Status: DC | PRN

## 2022-01-19 MED ORDER — POTASSIUM CHLORIDE CRYS ER 20 MEQ PO TBCR
40.0000 meq | EXTENDED_RELEASE_TABLET | ORAL | Status: AC
  Administered 2022-01-19 (×2): 40 meq via ORAL
  Filled 2022-01-19 (×2): qty 2

## 2022-01-19 NOTE — Progress Notes (Signed)
Mobility Specialist - Progress Note   01/19/22 1514  Mobility  Activity Ambulated with assistance in room  Level of Assistance Minimal assist, patient does 75% or more  Assistive Device Front wheel walker  Distance Ambulated (ft) 20 ft  Activity Response Tolerated well  Mobility Referral Yes  $Mobility charge 1 Mobility   Pt received in bed and agreeable to mobility. No complaints throughout. Pt was returned to bed with all needs met.  Franki Monte  Mobility Specialist Please contact via Solicitor or Rehab office at [phone]

## 2022-01-19 NOTE — TOC Progression Note (Addendum)
Transition of Care [facility]) - Progression Note    Patient Details  Name: Charlotte Powell MRN: [id number] Date of Birth: [birth year]  [facility] CM/SW Contact  Delilah Shan, LCSWA Phone Number: 01/19/2022, 11:26 AM  Clinical Narrative:     Update-12:49pm- CSW called [facility] back to see if patient able to dc over today. ALF informed CSW unable to accept patient today , no nurses in the building. CSW informed MD. CSW will continue to follow.  Per MD patient medically stable to dc over to ALF today.CSW called [facility] to see if patient would be able to dc over today. CSW called ALF was transferred to [employer]. CSW awaiting callback. CSW informed MD. CSW will continue to follow and assist with patients dc planning needs.   Expected Discharge Plan: Assisted Living Barriers to Discharge: Continued Medical Work up  Expected Discharge Plan and Services Expected Discharge Plan: Assisted Living In-house Referral: Clinical Social Work     Living arrangements for the past 2 months: Assisted Living Facility                                       Social Determinants of Health (SDOH) Interventions    Readmission Risk Interventions     No data to display

## 2022-01-19 NOTE — Progress Notes (Signed)
PROGRESS NOTE    Charlotte Powell  IHK:[id number] DOB: [birth year] DOA: 01/15/2022 PCP: Caesar Bookman, NP  Chief Complaint  Patient presents with   Abdominal Pain    Brief Narrative:   Charlotte Powell is Charlotte Powell 88 y.o. female with medical history significant of hypertension, hyperlipidemia, atrial fibrillation on Eliquis, hypothyroidism, compression fractures of the back, and GERD who presents with complaints of right lower quadrant abdominal pain.  She has been dealing with similar type pain underneath her rib cage that was thought secondary to compression fractures of her back for quite some time.  She is set to see the spine specialist on 12/5 to discuss options.  She was admitted for nausea, vomiting, and diarrhea and found to have Charlotte Powell rotavirus infection.  Hospitalization also complicated by RVR.    She's gradually improving, still some issues with RVR and some continued diarrhea.  Assessment & Plan:   Principal Problem:   Nausea, vomiting, and diarrhea Active Problems:   Persistent atrial fibrillation (HCC)   Chronic anticoagulation   Compression fracture of spine (HCC)   Thrombocytopenia (HCC)   HTN (hypertension)   HLD (hyperlipidemia)   Hypothyroidism   DNR (do not resuscitate)/DNI(Do Not Intubate)   Nausea vomiting and diarrhea   Rotavirus infection  Rotavirus Infection  Nausea, vomiting, and diarrhea  abdominal pain CT scan of the abdomen pelvis did not note any acute cause for the patient's symptoms.   Negative c diff GI pathogen panel with rotavirus Seems to be gradually improving, but recurrent diarrhea today  Persistent atrial fibrillation on chronic anticoagulation  Rapid Ventricular Response. - recurrent RVR today - IV metoprolol prn - I'd increased her to 45 mg q6 and she had bradycardia.  I resumed her home dose of 30 mg q8 and she's having tachycardia again.  Will bolus and start IVF as she has continued diarrhea.  Increase diltiazem to 30 q6. - consider  discussion with cardiology if continued issues - limited echo with EF 60-65%, no RWMA - recommended follow up study when she's able -Continue Cardizem and Eliquis   Compression fractures of the spine CT noted interval worsening of compression fractures of T7, T9, and T11.  Stable compression fractures noted of T6, T4 8, T12, L1,  and L2.  Patient already has an appointment with spine specialist scheduled for 12/5. -Keep outpatient appointment -Continue Tylenol as needed for pain   Hypokalemia  Hypomagnesemia Replace and follow  Thrombocytopenia Trend, will monitor   Essential hypertension -Continue home blood pressure regimen   Hyperlipidemia -Continue Crestor   Hypothyroidism TSH 3.989 on 11/28/2021. -Continue levothyroxine   DNR    DVT prophylaxis: eliquis Code Status: dnr Family Communication: duaghter at bedside Disposition:   Status is: Observation The patient will require care spanning > 2 midnights and should be moved to inpatient because: need for inpatient care and rate control   Consultants:  none  Procedures:  Echo IMPRESSIONS     1. Limited study due to pt with N/V; only limited parasternal images  obtained; suggest fu study when pt able.   2. Left ventricular ejection fraction, by estimation, is 60 to 65%. The  left ventricle has normal function. The left ventricle has no regional  wall motion abnormalities. Left ventricular diastolic function could not  be evaluated.   3. Right ventricular systolic function was not well visualized. The right  ventricular size is not well visualized.   4. Left atrial size was moderately dilated.   5. The mitral valve is normal  in structure. Mild mitral valve  regurgitation.   6. The aortic valve is tricuspid. Aortic valve regurgitation is not  visualized. Aortic valve sclerosis is present, with no evidence of aortic  valve stenosis.    Antimicrobials:  Anti-infectives (From admission, onward)    None        Subjective: Continued diarrhea today  Objective: Vitals:   01/17/22 2145 01/18/22 0112 01/18/22 0441 01/18/22 1212  BP: 113/64 118/60 (!) 128/54 129/85  Pulse:  (!) 59 77 (!) 110  Resp:  19 20 19   Temp:  97.6 F (36.4 C) 97.6 F (36.4 C) 97.8 F (36.6 C)  TempSrc:  Oral Oral Oral  SpO2:  97% 97% 98%    Intake/Output Summary (Last 24 hours) at 01/18/2022 [id number] Last data filed at 01/18/2022 1647 Gross per 24 hour  Intake --  Output 1350 ml  Net -1350 ml   There were no vitals filed for this visit.  Examination:  General: No acute distress. Cardiovascular: irregularly irregular, tachycardic Lungs: unlabored Abdomen: Soft, nontender, nondistended Neurological: Alert and oriented 3. Moves all extremities 4 with equal strength. Cranial nerves II through XII grossly intact. Extremities: No clubbing or cyanosis. No edema.   Data Reviewed: I have personally reviewed following labs and imaging studies  CBC: Recent Labs  Lab 01/15/22 2116 01/17/22 0515 01/18/22 0137  WBC 7.5 5.5 4.5  NEUTROABS 5.7  --   --   HGB 14.7 11.9* 11.6*  HCT 43.0 35.4* 33.7*  MCV 104.6* 104.4* 102.7*  PLT 123* 108* 98*    Basic Metabolic Panel: Recent Labs  Lab 01/15/22 2320 01/17/22 0515 01/18/22 0137  NA 139 136 136  K 3.5 3.4* 4.5  CL 108 107 107  CO2 23 23 22   GLUCOSE 130* 104* 137*  BUN 10 17 21   CREATININE 0.65 0.81 0.82  CALCIUM 9.0 8.7* 9.1  MG  --   --  1.5*  PHOS  --   --  2.5    GFR: CrCl cannot be calculated (Unknown ideal weight.).  Liver Function Tests: Recent Labs  Lab 01/15/22 2320  AST 22  ALT 12  ALKPHOS 77  BILITOT 1.3*  PROT 6.3*  ALBUMIN 3.5    CBG: No results for input(s): "GLUCAP" in the last 168 hours.   Recent Results (from the past 240 hour(s))  Resp Panel by RT-PCR (Flu Charlotte Powell&B, Covid) Anterior Nasal Swab     Status: None   Collection Time: 01/15/22  9:30 PM   Specimen: Anterior Nasal Swab  Result Value Ref Range Status   SARS  Coronavirus 2 by RT PCR NEGATIVE NEGATIVE Final    Comment: (NOTE) SARS-CoV-2 target nucleic acids are NOT DETECTED.  The SARS-CoV-2 RNA is generally detectable in upper respiratory specimens during the acute phase of infection. The lowest concentration of SARS-CoV-2 viral copies this assay can detect is 138 copies/mL. Charlotte Powell negative result does not preclude SARS-Cov-2 infection and should not be used as the sole basis for treatment or other patient management decisions. Charlotte Powell negative result may occur with  improper specimen collection/handling, submission of specimen other than nasopharyngeal swab, presence of viral mutation(s) within the areas targeted by this assay, and inadequate number of viral copies(<138 copies/mL). Charlotte Powell negative result must be combined with clinical observations, patient history, and epidemiological information. The expected result is Negative.  Fact Sheet for Patients:  EntrepreneurPulse.com.au  Fact Sheet for Healthcare Providers:  IncredibleEmployment.be  This test is no t yet approved or cleared by the [place] FDA  and  has been authorized for detection and/or diagnosis of SARS-CoV-2 by FDA under an Emergency Use Authorization (EUA). This EUA will remain  in effect (meaning this test can be used) for the duration of the COVID-19 declaration under Section 564(b)(1) of the Act, 21 U.S.C.section 360bbb-3(b)(1), unless the authorization is terminated  or revoked sooner.       Influenza Charlotte Powell by PCR NEGATIVE NEGATIVE Final   Influenza B by PCR NEGATIVE NEGATIVE Final    Comment: (NOTE) The Xpert Xpress SARS-CoV-2/FLU/RSV plus assay is intended as an aid in the diagnosis of influenza from Nasopharyngeal swab specimens and should not be used as Charlotte Powell sole basis for treatment. Nasal washings and aspirates are unacceptable for Xpert Xpress SARS-CoV-2/FLU/RSV testing.  Fact Sheet for  Patients: EntrepreneurPulse.com.au  Fact Sheet for Healthcare Providers: IncredibleEmployment.be  This test is not yet approved or cleared by the [place] FDA and has been authorized for detection and/or diagnosis of SARS-CoV-2 by FDA under an Emergency Use Authorization (EUA). This EUA will remain in effect (meaning this test can be used) for the duration of the COVID-19 declaration under Section 564(b)(1) of the Act, 21 U.S.C. section 360bbb-3(b)(1), unless the authorization is terminated or revoked.  Performed at [facility], [place] [address] [place], [place] [address]   Gastrointestinal Panel by PCR , Stool     Status: Abnormal   Collection Time: 01/16/22 12:27 PM   Specimen: Stool  Result Value Ref Range Status   Campylobacter species NOT DETECTED NOT DETECTED Final   Plesimonas shigelloides NOT DETECTED NOT DETECTED Final   Salmonella species NOT DETECTED NOT DETECTED Final   Yersinia enterocolitica NOT DETECTED NOT DETECTED Final   Vibrio species NOT DETECTED NOT DETECTED Final   Vibrio cholerae NOT DETECTED NOT DETECTED Final   Enteroaggregative E coli (EAEC) NOT DETECTED NOT DETECTED Final   Enteropathogenic E coli (EPEC) NOT DETECTED NOT DETECTED Final   Enterotoxigenic E coli (ETEC) NOT DETECTED NOT DETECTED Final   Shiga like toxin producing E coli (STEC) NOT DETECTED NOT DETECTED Final   Shigella/Enteroinvasive E coli (EIEC) NOT DETECTED NOT DETECTED Final   Cryptosporidium NOT DETECTED NOT DETECTED Final   Cyclospora cayetanensis NOT DETECTED NOT DETECTED Final   Entamoeba histolytica NOT DETECTED NOT DETECTED Final   Giardia lamblia NOT DETECTED NOT DETECTED Final   Adenovirus F40/41 NOT DETECTED NOT DETECTED Final   Astrovirus NOT DETECTED NOT DETECTED Final   Norovirus GI/GII NOT DETECTED NOT DETECTED Final   Rotavirus Charlotte Powell DETECTED (Dayami Taitt) NOT DETECTED Final   Sapovirus (I, II, IV, and V) NOT DETECTED NOT DETECTED Final     Comment: Performed at [facility], [place] [place], [place] [address]  C Difficile Quick Screen w PCR reflex     Status: None   Collection Time: 01/16/22 12:34 PM   Specimen: Stool  Result Value Ref Range Status   C Diff antigen NEGATIVE NEGATIVE Final   C Diff toxin NEGATIVE NEGATIVE Final   C Diff interpretation No C. difficile detected.  Final    Comment: Performed at [facility], [place] [address] [place], [place] [address]         Radiology Studies: ECHOCARDIOGRAM COMPLETE  Result Date: 01/17/2022    ECHOCARDIOGRAM REPORT   Patient Name:   Charlotte Powell Date of Exam: 01/17/2022 Medical Rec #:  [id number]       Height:       63.0 in Accession #:    [id number]  Weight:       142.0 lb Date of Birth:  Jan 12, 1929       BSA:          1.672 m Patient Age:    67 years        BP:           127/73 mmHg Patient Gender: F               HR:           103 bpm. Exam Location:  Inpatient Procedure: 2D Echo, Color Doppler and Cardiac Doppler Indications:    I48.91* Unspecified atrial fibrillation  History:        Patient has prior history of Echocardiogram examinations. CAD,                 Arrythmias:Atrial Fibrillation; Risk Factors:Dyslipidemia and                 Hypertension.  Sonographer:    Greer Pickerel Referring Phys: [phone] Kaicee Scarpino CALDWELL POWELL JR  Sonographer Comments: Unable to perform complete exam, terminated due to intractible vomiting and diarrhea. IMPRESSIONS  1. Limited study due to pt with N/V; only limited parasternal images obtained; suggest fu study when pt able.  2. Left ventricular ejection fraction, by estimation, is 60 to 65%. The left ventricle has normal function. The left ventricle has no regional wall motion abnormalities. Left ventricular diastolic function could not be evaluated.  3. Right ventricular systolic function was not well visualized. The right ventricular size is not well visualized.  4. Left atrial size was moderately dilated.  5. The  mitral valve is normal in structure. Mild mitral valve regurgitation.  6. The aortic valve is tricuspid. Aortic valve regurgitation is not visualized. Aortic valve sclerosis is present, with no evidence of aortic valve stenosis. FINDINGS  Left Ventricle: Left ventricular ejection fraction, by estimation, is 60 to 65%. The left ventricle has normal function. The left ventricle has no regional wall motion abnormalities. The left ventricular internal cavity size was normal in size. There is  no left ventricular hypertrophy. Left ventricular diastolic function could not be evaluated due to atrial fibrillation. Left ventricular diastolic function could not be evaluated. Right Ventricle: The right ventricular size is not well visualized. Right vetricular wall thickness was not assessed. Right ventricular systolic function was not well visualized. Left Atrium: Left atrial size was moderately dilated. Right Atrium: Right atrial size was not assessed. Pericardium: There is no evidence of pericardial effusion. Presence of epicardial fat layer. Mitral Valve: The mitral valve is normal in structure. Mild mitral valve regurgitation. Tricuspid Valve: The tricuspid valve is not well visualized. Tricuspid valve regurgitation is trivial. Aortic Valve: The aortic valve is tricuspid. Aortic valve regurgitation is not visualized. Aortic valve sclerosis is present, with no evidence of aortic valve stenosis. Pulmonic Valve: The pulmonic valve was normal in structure. Pulmonic valve regurgitation is trivial. No evidence of pulmonic stenosis. Aorta: The aortic root is normal in size and structure. IAS/Shunts: The interatrial septum was not assessed. Additional Comments: Limited study due to pt with N/V; only limited parasternal images obtained; suggest fu study when pt able. There is Tonyia Marschall small pleural effusion in the left lateral region.  LEFT VENTRICLE PLAX 2D LVIDd:         2.80 cm LVIDs:         2.00 cm LV PW:         1.00 cm LV IVS:  1.10 cm LVOT diam:     1.70 cm LVOT Area:     2.27 cm  LEFT ATRIUM         Index LA diam:    4.50 cm 2.69 cm/m   AORTA Ao Root diam: 3.10 cm Ao Asc diam:  2.90 cm  SHUNTS Systemic Diam: 1.70 cm Kirk Ruths MD Electronically signed by Kirk Ruths MD Signature Date/Time: 01/17/2022/3:55:56 PM    Final         Scheduled Meds:  acidophilus  1 capsule Oral BID   apixaban  2.5 mg Oral BID   diltiazem  15 mg Oral Once   diltiazem  30 mg Oral Q8H   fluticasone  1-2 spray Each Nare Daily   gabapentin  300 mg Oral QHS   levothyroxine  25 mcg Oral Q0600   melatonin  5 mg Oral QHS   pantoprazole  40 mg Oral Daily   phosphorus  500 mg Oral QID   rosuvastatin  20 mg Oral Daily   sodium chloride flush  3 mL Intravenous Q12H   Continuous Infusions:     LOS: 1 day   Time spent: over 30 min  Fayrene Helper, MD [facility]   To contact the attending provider between 7A-7P or the covering provider during after hours 7P-7A, please log into the web site [email or web address] and access using universal [place] password for that web site. If you do not have the password, please call the hospital operator.  01/18/2022, 6:52 PM

## 2022-01-19 NOTE — Plan of Care (Signed)

## 2022-01-20 DIAGNOSIS — R112 Nausea with vomiting, unspecified: Secondary | ICD-10-CM | POA: Diagnosis not present

## 2022-01-20 DIAGNOSIS — R197 Diarrhea, unspecified: Secondary | ICD-10-CM | POA: Diagnosis not present

## 2022-01-20 MED ORDER — MAGNESIUM SULFATE 2 GM/50ML IV SOLN
2.0000 g | Freq: Once | INTRAVENOUS | Status: AC
  Administered 2022-01-20: 2 g via INTRAVENOUS
  Filled 2022-01-20: qty 50

## 2022-01-20 MED ORDER — GERHARDT'S BUTT CREAM
TOPICAL_CREAM | Freq: Two times a day (BID) | CUTANEOUS | Status: DC
  Filled 2022-01-20: qty 1

## 2022-01-20 MED ORDER — GUAIFENESIN 100 MG/5ML PO LIQD
10.0000 mL | Freq: Four times a day (QID) | ORAL | Status: DC | PRN
  Administered 2022-01-20 – 2022-01-21 (×2): 10 mL via ORAL
  Filled 2022-01-20 (×2): qty 10

## 2022-01-20 MED ORDER — POTASSIUM CHLORIDE CRYS ER 20 MEQ PO TBCR
40.0000 meq | EXTENDED_RELEASE_TABLET | ORAL | Status: DC
  Administered 2022-01-20: 40 meq via ORAL
  Filled 2022-01-20: qty 2

## 2022-01-20 NOTE — Progress Notes (Signed)
Occupational Therapy Treatment Patient Details Name: Charlotte Powell MRN: [id number] DOB: 10-Nov-1928 Today's Date: 01/20/2022   History of present illness Pt is a 88 y.o. female admitted from ALF on 01/15/22 with abdominal pain, N/V; workup for rotavirus. Abdominal CT showed persistent bilateral trace pleural effusions, chronic diverticulosis, compression fxs T6-L2. Of note, recent admission 11/2021 with toxic encephalopathy after starting baclofen. Other PMH includes afib, CAD, HTN, HLD, GERD, diverticulitis, compression fxs (pt has appt with spine specialist 12/5).   OT comments  Pt progressing towards goals this session, completing simulated toilet transfer with min guard A and able to complete seated therex while up in chair. Pt declining ADLs at this time, HR up to 140 with mobility and seated exercise. Pt presenting with impairments listed below, will follow acutely. Continue to recommend HHOT at ALF at d/c.   Recommendations for follow up therapy are one component of a multi-disciplinary discharge planning process, led by the attending physician.  Recommendations may be updated based on patient status, additional functional criteria and insurance authorization.    Follow Up Recommendations  Home health OT     Assistance Recommended at Discharge Intermittent Supervision/Assistance  Patient can return home with the following  A little help with walking and/or transfers;A lot of help with bathing/dressing/bathroom;Assistance with cooking/housework;Assist for transportation;Help with stairs or ramp for entrance;Direct supervision/assist for medications management;Direct supervision/assist for financial management   Equipment Recommendations  None recommended by OT (pt has all needed DME)    Recommendations for Other Services PT consult    Precautions / Restrictions Precautions Precautions: Fall;Other (comment) Precaution Comments: bladder/bowel urgency Restrictions Weight Bearing  Restrictions: No       Mobility Bed Mobility Overal bed mobility: Needs Assistance Bed Mobility: Sidelying to Sit   Sidelying to sit: Min guard            Transfers Overall transfer level: Needs assistance Equipment used: Rolling walker (2 wheels) Transfers: Sit to/from Stand Sit to Stand: Min guard, Min assist                 Balance Overall balance assessment: Needs assistance   Sitting balance-Leahy Scale: Fair Sitting balance - Comments: can reach minimally outside BOS without LOB   Standing balance support: Single extremity supported, Bilateral upper extremity supported, During functional activity Standing balance-Leahy Scale: Poor                             ADL either performed or assessed with clinical judgement   ADL Overall ADL's : Needs assistance/impaired                         Toilet Transfer: Min guard;Ambulation;Regular Toilet           Functional mobility during ADLs: Min guard;Rolling walker (2 wheels)      Extremity/Trunk Assessment Upper Extremity Assessment Upper Extremity Assessment: Generalized weakness LUE Deficits / Details: h/o chronic L shoulder impairment, flex/abd limited to <90'   Lower Extremity Assessment Lower Extremity Assessment: Generalized weakness        Vision   Vision Assessment?: No apparent visual deficits   Perception Perception Perception: Not tested   Praxis Praxis Praxis: Not tested    Cognition Arousal/Alertness: Awake/alert Behavior During Therapy: WFL for tasks assessed/performed Overall Cognitive Status: Within Functional Limits for tasks assessed  General Comments: WFL for simple tasks, expected age-related changes, suspect baseline        Exercises Exercises: General Upper Extremity, General Lower Extremity General Exercises - Upper Extremity Shoulder Flexion: AROM, Both, 10 reps, Seated General Exercises - Lower  Extremity Ankle Circles/Pumps: AROM, Both, 10 reps, Seated Long Arc Quad: AROM, Both, 10 reps, Seated Hip Flexion/Marching: AROM, Both, 10 reps, Seated    Shoulder Instructions       General Comments HR up to 140's with mobility and seated exercise    Pertinent Vitals/ Pain       Pain Assessment Pain Assessment: No/denies pain  Home Living                                          Prior Functioning/Environment              Frequency  Min 2X/week        Progress Toward Goals  OT Goals(current goals can now be found in the care plan section)  Progress towards OT goals: Progressing toward goals  Acute Rehab OT Goals Patient Stated Goal: none stated OT Goal Formulation: With patient Time For Goal Achievement: 02/01/22 Potential to Achieve Goals: Good ADL Goals Pt Will Perform Upper Body Dressing: with min guard assist;sitting Pt Will Perform Lower Body Dressing: with min guard assist;sit to/from stand;sitting/lateral leans Pt Will Transfer to Toilet: with min guard assist;ambulating;regular height toilet Pt Will Perform Toileting - Clothing Manipulation and hygiene: with min guard assist;sitting/lateral leans;sit to/from stand Pt Will Perform Tub/Shower Transfer: with min guard assist;ambulating;shower seat;rolling walker  Plan Discharge plan remains appropriate;Frequency remains appropriate    Co-evaluation                 AM-PAC OT "6 Clicks" Daily Activity     Outcome Measure   Help from another person eating meals?: None Help from another person taking care of personal grooming?: A Little Help from another person toileting, which includes using toliet, bedpan, or urinal?: A Lot Help from another person bathing (including washing, rinsing, drying)?: A Lot Help from another person to put on and taking off regular upper body clothing?: A Little Help from another person to put on and taking off regular lower body clothing?: A Lot 6  Click Score: 16    End of Session Equipment Utilized During Treatment: Gait belt;Rolling walker (2 wheels)  OT Visit Diagnosis: Unsteadiness on feet (R26.81);Other abnormalities of gait and mobility (R26.89);Muscle weakness (generalized) (M62.81)   Activity Tolerance Patient tolerated treatment well   Patient Left in chair;with call bell/phone within reach;with chair alarm set   Nurse Communication Mobility status        Time: 2482-5003 OT Time Calculation (min): 24 min  Charges: OT General Charges $OT Visit: 1 Visit OT Treatments $Self Care/Home Management : 8-22 mins $Therapeutic Exercise: 8-22 mins  Carver Fila, OTD, OTR/L SecureChat Preferred Acute Rehab [phone]   Brandie Lopes K Koonce 01/20/2022, 11:02 AM

## 2022-01-20 NOTE — Progress Notes (Signed)
PROGRESS NOTE    Charlotte Powell  [id number] DOB: 06-Sep-1928 DOA: 01/15/2022 PCP: Sandrea Hughs, NP    Brief Narrative:  88 y.o. female with medical history significant of hypertension, hyperlipidemia, atrial fibrillation on Eliquis, hypothyroidism, compression fractures of the back, and GERD who presented with complaints of right lower quadrant abdominal pain.  She has been dealing with similar type pain underneath her rib cage that was thought secondary to compression fractures of her back for quite some time. She was admitted for nausea, vomiting, and diarrhea and found to have a rotavirus infection.  Hospitalization also complicated by RVR.    She's gradually improving, heart rate fluctuates.  Electrolytes are low.     Assessment & Plan:   Acute viral gastroenteritis with rotavirus, intractable nausea vomiting and diarrhea: CT scan abdomen pelvis without acute findings.  GI pathogen panel with rotavirus.  Gradually improving.  Appetite is improving.  Continue maintenance IV fluids today, encourage oral intake.  Replace electrolytes.  Persistent A-fib on chronic anticoagulation with RVR: Heart rate fluctuates.  At home on Cardizem 30 mg 3 times a day.  Was changed to 45 mg 4 times a day that caused bradycardia.  She is with fluctuating heart rate but mostly asymptomatic. Currently on Cardizem 30 mg every 6 hours with heart rate 110-150 occasionally.  Patient has tendency to go into bradycardia so will avoid strict heart rate control until symptomatic.  Therapeutic on Eliquis.  Hypokalemia/hypomagnesemia: Aggressively replaced.  Will repeat levels tomorrow morning to decide any discharge prescriptions.  Chronic medical issues including Compression fracture Essential hypertension Hyperlipidemia Hypothyroidism Stable.   DVT prophylaxis: apixaban (ELIQUIS) tablet 2.5 mg Start: 01/16/22 1030 apixaban (ELIQUIS) tablet 2.5 mg   Code Status: DNR Family Communication: Patient's  daughter at the bedside Disposition Plan: Status is: Inpatient Remains inpatient appropriate because: RVR, electrolyte abnormalities,     Consultants:  None  Procedures:  None  Antimicrobials:  None   Subjective: Patient seen and examined.  She was eager to go back to the assisted living facility.  Appetite is poor but denies any nausea vomiting.  She had 1 loose watery stool overnight.  Daughter at the bedside.  Remains in A-fib and occasionally heart rate going up to 150 but not sustaining.  Objective: Vitals:   01/20/22 0039 01/20/22 0548 01/20/22 0549 01/20/22 0806  BP: 122/86 128/87 128/87 114/83  Pulse: 92  (!) 156 (!) 101  Resp: 18  17 18   Temp: 98.7 F (37.1 C)  97.6 F (36.4 C) 98.1 F (36.7 C)  TempSrc: Oral  Oral Oral  SpO2: 97%  96% 95%  Weight:      Height:        Intake/Output Summary (Last 24 hours) at 01/20/2022 1139 Last data filed at 01/20/2022 0555 Gross per 24 hour  Intake 240 ml  Output 1200 ml  Net -960 ml   Filed Weights   01/19/22 1857  Weight: 61.2 kg    Examination:  General exam: Appears calm and comfortable, on room air. Respiratory system: Clear to auscultation. Respiratory effort normal.  No added sounds. Cardiovascular system: S1 & S2 heard, irregularly irregular.  No pedal edema. Gastrointestinal system: Abdomen is nondistended, soft and nontender. No organomegaly or masses felt. Normal bowel sounds heard. Central nervous system: Alert and oriented. No focal neurological deficits. Extremities: Symmetric 5 x 5 power. Skin: No rashes, lesions or ulcers Psychiatry: Judgement and insight appear normal. Mood & affect appropriate.     Data Reviewed: I have personally  reviewed following labs and imaging studies  CBC: Recent Labs  Lab 01/15/22 2116 01/17/22 0515 01/18/22 0137  WBC 7.5 5.5 4.5  NEUTROABS 5.7  --   --   HGB 14.7 11.9* 11.6*  HCT 43.0 35.4* 33.7*  MCV 104.6* 104.4* 102.7*  PLT 123* 108* 98*   Basic  Metabolic Panel: Recent Labs  Lab 01/15/22 2320 01/17/22 0515 01/18/22 0137 01/19/22 0126  NA 139 136 136 138  K 3.5 3.4* 4.5 3.3*  CL 108 107 107 104  CO2 23 23 22 25   GLUCOSE 130* 104* 137* 116*  BUN 10 17 21 13   CREATININE 0.65 0.81 0.82 0.69  CALCIUM 9.0 8.7* 9.1 8.5*  MG  --   --  1.5* 1.5*  PHOS  --   --  2.5  --    GFR: Estimated Creatinine Clearance: 37.9 mL/min (by C-G formula based on SCr of 0.69 mg/dL). Liver Function Tests: Recent Labs  Lab 01/15/22 2320  AST 22  ALT 12  ALKPHOS 77  BILITOT 1.3*  PROT 6.3*  ALBUMIN 3.5   Recent Labs  Lab 01/15/22 2320  LIPASE 32   No results for input(s): "AMMONIA" in the last 168 hours. Coagulation Profile: No results for input(s): "INR", "PROTIME" in the last 168 hours. Cardiac Enzymes: Recent Labs  Lab 01/18/22 0137  CKTOTAL 40   BNP (last 3 results) No results for input(s): "PROBNP" in the last 8760 hours. HbA1C: No results for input(s): "HGBA1C" in the last 72 hours. CBG: No results for input(s): "GLUCAP" in the last 168 hours. Lipid Profile: No results for input(s): "CHOL", "HDL", "LDLCALC", "TRIG", "CHOLHDL", "LDLDIRECT" in the last 72 hours. Thyroid Function Tests: No results for input(s): "TSH", "T4TOTAL", "FREET4", "T3FREE", "THYROIDAB" in the last 72 hours. Anemia Panel: No results for input(s): "VITAMINB12", "FOLATE", "FERRITIN", "TIBC", "IRON", "RETICCTPCT" in the last 72 hours. Sepsis Labs: No results for input(s): "PROCALCITON", "LATICACIDVEN" in the last 168 hours.  Recent Results (from the past 240 hour(s))  Resp Panel by RT-PCR (Flu A&B, Covid) Anterior Nasal Swab     Status: None   Collection Time: 01/15/22  9:30 PM   Specimen: Anterior Nasal Swab  Result Value Ref Range Status   SARS Coronavirus 2 by RT PCR NEGATIVE NEGATIVE Final    Comment: (NOTE) SARS-CoV-2 target nucleic acids are NOT DETECTED.  The SARS-CoV-2 RNA is generally detectable in upper respiratory specimens during the  acute phase of infection. The lowest concentration of SARS-CoV-2 viral copies this assay can detect is 138 copies/mL. A negative result does not preclude SARS-Cov-2 infection and should not be used as the sole basis for treatment or other patient management decisions. A negative result may occur with  improper specimen collection/handling, submission of specimen other than nasopharyngeal swab, presence of viral mutation(s) within the areas targeted by this assay, and inadequate number of viral copies(<138 copies/mL). A negative result must be combined with clinical observations, patient history, and epidemiological information. The expected result is Negative.  Fact Sheet for Patients:  EntrepreneurPulse.com.au  Fact Sheet for Healthcare Providers:  IncredibleEmployment.be  This test is no t yet approved or cleared by the [place] FDA and  has been authorized for detection and/or diagnosis of SARS-CoV-2 by FDA under an Emergency Use Authorization (EUA). This EUA will remain  in effect (meaning this test can be used) for the duration of the COVID-19 declaration under Section 564(b)(1) of the Act, 21 U.S.C.section 360bbb-3(b)(1), unless the authorization is terminated  or revoked sooner.  Influenza A by PCR NEGATIVE NEGATIVE Final   Influenza B by PCR NEGATIVE NEGATIVE Final    Comment: (NOTE) The Xpert Xpress SARS-CoV-2/FLU/RSV plus assay is intended as an aid in the diagnosis of influenza from Nasopharyngeal swab specimens and should not be used as a sole basis for treatment. Nasal washings and aspirates are unacceptable for Xpert Xpress SARS-CoV-2/FLU/RSV testing.  Fact Sheet for Patients: BloggerCourse.com  Fact Sheet for Healthcare Providers: SeriousBroker.it  This test is not yet approved or cleared by the [place] FDA and has been authorized for detection and/or  diagnosis of SARS-CoV-2 by FDA under an Emergency Use Authorization (EUA). This EUA will remain in effect (meaning this test can be used) for the duration of the COVID-19 declaration under Section 564(b)(1) of the Act, 21 U.S.C. section 360bbb-3(b)(1), unless the authorization is terminated or revoked.  Performed at [facility], [address], [place], [place] [address]   Gastrointestinal Panel by PCR , Stool     Status: Abnormal   Collection Time: 01/16/22 12:27 PM   Specimen: Stool  Result Value Ref Range Status   Campylobacter species NOT DETECTED NOT DETECTED Final   Plesimonas shigelloides NOT DETECTED NOT DETECTED Final   Salmonella species NOT DETECTED NOT DETECTED Final   Yersinia enterocolitica NOT DETECTED NOT DETECTED Final   Vibrio species NOT DETECTED NOT DETECTED Final   Vibrio cholerae NOT DETECTED NOT DETECTED Final   Enteroaggregative E coli (EAEC) NOT DETECTED NOT DETECTED Final   Enteropathogenic E coli (EPEC) NOT DETECTED NOT DETECTED Final   Enterotoxigenic E coli (ETEC) NOT DETECTED NOT DETECTED Final   Shiga like toxin producing E coli (STEC) NOT DETECTED NOT DETECTED Final   Shigella/Enteroinvasive E coli (EIEC) NOT DETECTED NOT DETECTED Final   Cryptosporidium NOT DETECTED NOT DETECTED Final   Cyclospora cayetanensis NOT DETECTED NOT DETECTED Final   Entamoeba histolytica NOT DETECTED NOT DETECTED Final   Giardia lamblia NOT DETECTED NOT DETECTED Final   Adenovirus F40/41 NOT DETECTED NOT DETECTED Final   Astrovirus NOT DETECTED NOT DETECTED Final   Norovirus GI/GII NOT DETECTED NOT DETECTED Final   Rotavirus A DETECTED (A) NOT DETECTED Final   Sapovirus (I, II, IV, and V) NOT DETECTED NOT DETECTED Final    Comment: Performed at [facility], [address] [place], [place] [address]  C Difficile Quick Screen w PCR reflex     Status: None   Collection Time: 01/16/22 12:34 PM   Specimen: Stool  Result Value Ref Range Status   C Diff  antigen NEGATIVE NEGATIVE Final   C Diff toxin NEGATIVE NEGATIVE Final   C Diff interpretation No C. difficile detected.  Final    Comment: Performed at [facility], [address], [place], [place] [address]         Radiology Studies: No results found.      Scheduled Meds:  acidophilus  1 capsule Oral BID   apixaban  2.5 mg Oral BID   diltiazem  30 mg Oral Q6H   fluticasone  1-2 spray Each Nare Daily   gabapentin  300 mg Oral QHS   Gerhardt's butt cream   Topical BID   levothyroxine  25 mcg Oral Q0600   melatonin  5 mg Oral QHS   pantoprazole  40 mg Oral Daily   rosuvastatin  20 mg Oral Daily   sodium chloride flush  3 mL Intravenous Q12H   Continuous Infusions:  sodium chloride 10 mL/hr at 01/19/22 2100  lactated ringers 1,000 mL with potassium chloride 20 mEq infusion 75 mL/hr at 01/20/22 0551     LOS: 3 days    Time spent: 35 minutes    Dorcas Carrow, MD [facility] Pager [phone]

## 2022-01-21 ENCOUNTER — Other Ambulatory Visit (HOSPITAL_COMMUNITY): Payer: Self-pay

## 2022-01-21 DIAGNOSIS — R112 Nausea with vomiting, unspecified: Secondary | ICD-10-CM | POA: Diagnosis not present

## 2022-01-21 DIAGNOSIS — R197 Diarrhea, unspecified: Secondary | ICD-10-CM | POA: Diagnosis not present

## 2022-01-21 LAB — CBC WITH DIFFERENTIAL/PLATELET
Abs Immature Granulocytes: 0.1 10*3/uL — ABNORMAL HIGH (ref 0.00–0.07)
Basophils Absolute: 0 10*3/uL (ref 0.0–0.1)
Basophils Relative: 0 %
Eosinophils Absolute: 0 10*3/uL (ref 0.0–0.5)
Eosinophils Relative: 1 %
HCT: 34.7 % — ABNORMAL LOW (ref 36.0–46.0)
Hemoglobin: 12 g/dL (ref 12.0–15.0)
Immature Granulocytes: 2 %
Lymphocytes Relative: 46 %
Lymphs Abs: 3.1 10*3/uL (ref 0.7–4.0)
MCH: 36 pg — ABNORMAL HIGH (ref 26.0–34.0)
MCHC: 34.6 g/dL (ref 30.0–36.0)
MCV: 104.2 fL — ABNORMAL HIGH (ref 80.0–100.0)
Monocytes Absolute: 0.4 10*3/uL (ref 0.1–1.0)
Monocytes Relative: 6 %
Neutro Abs: 2.9 10*3/uL (ref 1.7–7.7)
Neutrophils Relative %: 45 %
Platelets: 90 10*3/uL — ABNORMAL LOW (ref 150–400)
RBC: 3.33 MIL/uL — ABNORMAL LOW (ref 3.87–5.11)
RDW: 14.1 % (ref 11.5–15.5)
WBC: 6.5 10*3/uL (ref 4.0–10.5)
nRBC: 0 % (ref 0.0–0.2)

## 2022-01-21 LAB — MAGNESIUM: Magnesium: 2 mg/dL (ref 1.7–2.4)

## 2022-01-21 LAB — BASIC METABOLIC PANEL
Anion gap: 4 — ABNORMAL LOW (ref 5–15)
BUN: 7 mg/dL — ABNORMAL LOW (ref 8–23)
CO2: 26 mmol/L (ref 22–32)
Calcium: 8.8 mg/dL — ABNORMAL LOW (ref 8.9–10.3)
Chloride: 104 mmol/L (ref 98–111)
Creatinine, Ser: 0.74 mg/dL (ref 0.44–1.00)
GFR, Estimated: >60 mL/min (ref >60)
Glucose, Bld: 119 mg/dL — ABNORMAL HIGH (ref 70–99)
Potassium: 4.5 mmol/L (ref 3.5–5.1)
Sodium: 134 mmol/L — ABNORMAL LOW (ref 135–145)

## 2022-01-21 LAB — PHOSPHORUS: Phosphorus: 3.2 mg/dL (ref 2.5–4.6)

## 2022-01-21 MED ORDER — GUAIFENESIN ER 600 MG PO TB12
600.0000 mg | ORAL_TABLET | Freq: Two times a day (BID) | ORAL | 0 refills | Status: DC | PRN
  Filled 2022-01-21: qty 20, 10d supply, fill #0

## 2022-01-21 MED ORDER — ONDANSETRON HCL 4 MG PO TABS
4.0000 mg | ORAL_TABLET | Freq: Three times a day (TID) | ORAL | 0 refills | Status: DC | PRN
  Filled 2022-01-21 (×2): qty 12, 4d supply, fill #0

## 2022-01-21 MED ORDER — ONDANSETRON HCL 4 MG PO TABS: 4.0000 mg | ORAL_TABLET | Freq: Three times a day (TID) | ORAL | 0 refills | Status: DC | PRN

## 2022-01-21 MED ORDER — GUAIFENESIN 100 MG/5ML PO LIQD: 10.0000 mL | Freq: Four times a day (QID) | ORAL | 0 refills | Status: DC | PRN

## 2022-01-21 NOTE — Care Management Important Message (Signed)
Important Message  Patient Details  Name: Charlotte Powell MRN: [id number] Date of Birth: Dec 28, 1928   Medicare Important Message Given:  Yes     Renie Ora 01/21/2022, 9:40 AM

## 2022-01-21 NOTE — Discharge Summary (Addendum)
Physician Discharge Summary  Charlotte Powell [id number] DOB: [birth year] DOA: 01/15/2022  PCP: Sandrea Hughs, NP  Admit date: 01/15/2022 Discharge date: 01/21/2022  Admitted From: Assisted living facility Disposition: Assisted living facility  Recommendations for Outpatient Follow-up:  Follow up with PCP in 1-2 weeks Please obtain BMP/CBC/magnesium /phosphorus in one week   Home Health: PT Equipment/Devices: None  Discharge Condition: Stable CODE STATUS: DNR Diet recommendation: Regular diet, nutritional supplements.  Discharge summary: 88 y.o. female with medical history significant of hypertension, hyperlipidemia, atrial fibrillation on Eliquis, hypothyroidism, compression fractures of the back, and GERD who presented with complaints of right lower quadrant abdominal pain.  She has been dealing with similar type pain underneath her rib cage that was thought secondary to compression fractures of her back for quite some time. She was admitted for nausea, vomiting, and diarrhea and found to have a rotavirus infection. Hospitalization also complicated by RVR.    She's gradually improving, heart rate improved .  Electrolytes are adequate today.     Assessment & Plan of care:   Acute viral gastroenteritis with rotavirus, intractable nausea vomiting and diarrhea: CT scan abdomen pelvis without acute findings.  GI pathogen panel with rotavirus.  Gradually improving.  Appetite is improving.  GI symptoms improved.  No more diarrhea.  Does not need any isolation now.   Persistent A-fib on chronic anticoagulation with RVR: Heart rate fluctuated.  She had RVR with low electrolytes and dehydration.  On Cardizem 30 mg 3 times a day at home.  She had received additional dose of Cardizem causing her heart rate to drop to 50. Optimized with Cardizem 30 mg 3 times a day.  will accept occasional asymptomatic tachycardia. Therapeutic on Eliquis.  Hypokalemia/hypomagnesemia: Aggressively  replaced.  Improved.   Chronic medical issues including Compression fracture, on Tylenol Essential hypertension, stable Hyperlipidemia, on statin Hypothyroidism, on Synthroid Stable.  Patient is stable for transfer to ALF.  No changes were done for her long-term medications.  She was only prescribed as needed nausea medication and Robitussin.     Discharge Diagnoses:  Principal Problem:   Nausea, vomiting, and diarrhea Active Problems:   Persistent atrial fibrillation (HCC)   Chronic anticoagulation   Compression fracture of spine (HCC)   Thrombocytopenia (HCC)   HTN (hypertension)   HLD (hyperlipidemia)   Hypothyroidism   DNR (do not resuscitate)/DNI(Do Not Intubate)   Nausea vomiting and diarrhea   Rotavirus infection    Discharge Instructions  Discharge Instructions     Diet - low sodium heart healthy   Complete by: As directed    Increase activity slowly   Complete by: As directed    No wound care   Complete by: As directed       Allergies as of 01/21/2022       Reactions   Baclofen Other (See Comments)   hypoactive encephalopathy due to baclofen   Amoxicillin Other (See Comments)   unknown   Hydrocodone Other (See Comments)   Confusion and AMS        Medication List     STOP taking these medications    Reguloid 400 MG Caps Generic drug: Psyllium       TAKE these medications    acetaminophen 500 MG tablet Commonly known as: TYLENOL Take 1,000 mg by mouth every 8 (eight) hours as needed for fever (pain). What changed: Another medication with the same name was removed. Continue taking this medication, and follow the directions you see here.   Anti-Diarrheal 2 MG  tablet Generic drug: loperamide Take 2 mg by mouth every 6 (six) hours as needed for diarrhea or loose stools. What changed: Another medication with the same name was removed. Continue taking this medication, and follow the directions you see here.   Biofreeze 4 % Gel Generic  drug: Menthol (Topical Analgesic) Apply 1 Application topically in the morning and at bedtime. Apply to left upper quadrant abdominal area liberally.   Cholecalciferol 25 MCG (1000 UT) capsule Take 1,000 Units by mouth daily.   diltiazem 30 MG tablet Commonly known as: Cardizem Take 1 tablet (30 mg total) by mouth 3 (three) times daily.   Eliquis 2.5 MG Tabs tablet Generic drug: apixaban Take 1 tablet (2.5 mg total) by mouth 2 (two) times daily.   fluticasone 50 MCG/ACT nasal spray Commonly known as: FLONASE Place 1 spray into both nostrils daily.   folic acid Q000111Q MCG tablet Commonly known as: FOLVITE Take 800 mcg by mouth daily.   gabapentin 300 MG capsule Commonly known as: NEURONTIN Take 300 mg by mouth at bedtime.   guaiFENesin 100 MG/5ML liquid Commonly known as: ROBITUSSIN Take 10 mLs by mouth every 6 (six) hours as needed for cough or to loosen phlegm.   levothyroxine 25 MCG tablet Commonly known as: SYNTHROID Take 25 mcg by mouth daily.   lidocaine 5 % Commonly known as: LIDODERM Place 1 patch onto the skin every morning. Apply 1 patch to lower back every morning and remove 12 hours later. What changed: Another medication with the same name was removed. Continue taking this medication, and follow the directions you see here.   melatonin 5 MG Tabs Take 5 mg by mouth at bedtime.   nitroGLYCERIN 0.4 MG SL tablet Commonly known as: NITROSTAT Place 0.4 mg under the tongue every 5 (five) minutes as needed for chest pain.   omeprazole 20 MG capsule Commonly known as: PRILOSEC Take 20 mg by mouth daily before breakfast.   ondansetron 4 MG tablet Commonly known as: ZOFRAN Take 1 tablet (4 mg total) by mouth every 8 (eight) hours as needed for nausea or vomiting.   phenylephrine-shark liver oil-mineral oil-petrolatum 0.25-14-74.9 % rectal ointment Commonly known as: PREPARATION H Place 1 Application rectally 4 (four) times daily as needed for hemorrhoids (rectal  pain).   Polyethylene Glycol 3350 Powd Take 17 g by mouth daily as needed (constipation).   Probiotic 250 MG Caps Take 250 mg by mouth in the morning and at bedtime.   rosuvastatin 20 MG tablet Commonly known as: CRESTOR Take 1 tablet (20 mg total) by mouth daily.   Senexon-S 8.6-50 MG tablet Generic drug: senna-docusate Take 1 tablet by mouth 3 (three) times a week. Monday, Wednesday, and Friday at bedtime.   simethicone 125 MG chewable tablet Commonly known as: MYLICON Chew 0000000 mg by mouth 4 (four) times daily as needed for flatulence.        Allergies  Allergen Reactions   Baclofen Other (See Comments)    hypoactive encephalopathy due to baclofen   Amoxicillin Other (See Comments)    unknown   Hydrocodone Other (See Comments)    Confusion and AMS    Consultations: None   Procedures/Studies: ECHOCARDIOGRAM COMPLETE  Result Date: 01/17/2022    ECHOCARDIOGRAM REPORT   Patient Name:   Charlotte Powell Date of Exam: 01/17/2022 Medical Rec #:  [id number]       Height:       63.0 in Accession #:    [id number]      Weight:  142.0 lb Date of Birth:  February 07, 1929       BSA:          1.672 m Patient Age:    40 years        BP:           127/73 mmHg Patient Gender: F               HR:           103 bpm. Exam Location:  Inpatient Procedure: 2D Echo, Color Doppler and Cardiac Doppler Indications:    I48.91* Unspecified atrial fibrillation  History:        Patient has prior history of Echocardiogram examinations. CAD,                 Arrythmias:Atrial Fibrillation; Risk Factors:Dyslipidemia and                 Hypertension.  Sonographer:    Greer Pickerel Referring Phys: [phone] A CALDWELL POWELL JR  Sonographer Comments: Unable to perform complete exam, terminated due to intractible vomiting and diarrhea. IMPRESSIONS  1. Limited study due to pt with N/V; only limited parasternal images obtained; suggest fu study when pt able.  2. Left ventricular ejection fraction, by estimation, is 60 to  65%. The left ventricle has normal function. The left ventricle has no regional wall motion abnormalities. Left ventricular diastolic function could not be evaluated.  3. Right ventricular systolic function was not well visualized. The right ventricular size is not well visualized.  4. Left atrial size was moderately dilated.  5. The mitral valve is normal in structure. Mild mitral valve regurgitation.  6. The aortic valve is tricuspid. Aortic valve regurgitation is not visualized. Aortic valve sclerosis is present, with no evidence of aortic valve stenosis. FINDINGS  Left Ventricle: Left ventricular ejection fraction, by estimation, is 60 to 65%. The left ventricle has normal function. The left ventricle has no regional wall motion abnormalities. The left ventricular internal cavity size was normal in size. There is  no left ventricular hypertrophy. Left ventricular diastolic function could not be evaluated due to atrial fibrillation. Left ventricular diastolic function could not be evaluated. Right Ventricle: The right ventricular size is not well visualized. Right vetricular wall thickness was not assessed. Right ventricular systolic function was not well visualized. Left Atrium: Left atrial size was moderately dilated. Right Atrium: Right atrial size was not assessed. Pericardium: There is no evidence of pericardial effusion. Presence of epicardial fat layer. Mitral Valve: The mitral valve is normal in structure. Mild mitral valve regurgitation. Tricuspid Valve: The tricuspid valve is not well visualized. Tricuspid valve regurgitation is trivial. Aortic Valve: The aortic valve is tricuspid. Aortic valve regurgitation is not visualized. Aortic valve sclerosis is present, with no evidence of aortic valve stenosis. Pulmonic Valve: The pulmonic valve was normal in structure. Pulmonic valve regurgitation is trivial. No evidence of pulmonic stenosis. Aorta: The aortic root is normal in size and structure. IAS/Shunts:  The interatrial septum was not assessed. Additional Comments: Limited study due to pt with N/V; only limited parasternal images obtained; suggest fu study when pt able. There is a small pleural effusion in the left lateral region.  LEFT VENTRICLE PLAX 2D LVIDd:         2.80 cm LVIDs:         2.00 cm LV PW:         1.00 cm LV IVS:        1.10 cm LVOT diam:  1.70 cm LVOT Area:     2.27 cm  LEFT ATRIUM         Index LA diam:    4.50 cm 2.69 cm/m   AORTA Ao Root diam: 3.10 cm Ao Asc diam:  2.90 cm  SHUNTS Systemic Diam: 1.70 cm Kirk Ruths MD Electronically signed by Kirk Ruths MD Signature Date/Time: 01/17/2022/3:55:56 PM    Final    CT ABDOMEN PELVIS WO CONTRAST  Result Date: 01/15/2022 CLINICAL DATA:  Abdominal pain, acute, nonlocalized. Nausea and vomiting starting around 7pm with epigastric discomfort and generalized body pain. EXAM: CT ABDOMEN AND PELVIS WITHOUT CONTRAST TECHNIQUE: Multidetector CT imaging of the abdomen and pelvis was performed following the standard protocol without IV contrast. RADIATION DOSE REDUCTION: This exam was performed according to the departmental dose-optimization program which includes automated exposure control, adjustment of the mA and/or kV according to patient size and/or use of iterative reconstruction technique. COMPARISON:  CT abdomen pelvis 11/27/2021 FINDINGS: Lower chest: Persistent bilateral trace pleural effusions. Bilateral lower lobe subsegmental atelectasis. Four-vessel coronary calcification. Tiny hiatal hernia. Hepatobiliary: No focal liver abnormality. Status post cholecystectomy. No biliary dilatation. Pancreas: Diffusely atrophic. No focal lesion. Otherwise normal pancreatic contour. No surrounding inflammatory changes. No main pancreatic ductal dilatation. Spleen: Normal in size without focal abnormality. Adrenals/Urinary Tract: No adrenal nodule bilaterally. No nephrolithiasis and no hydronephrosis. No definite contour-deforming renal mass. No  ureterolithiasis or hydroureter. The urinary bladder is unremarkable. Stomach/Bowel: Stomach is within normal limits. No evidence of bowel wall thickening or dilatation. Colonic diverticulosis. Appendix appears normal. Vascular/Lymphatic: No abdominal aorta or iliac aneurysm. Severe atherosclerotic plaque of the aorta and its branches. No abdominal, pelvic, or inguinal lymphadenopathy. Reproductive: Uterus and bilateral adnexa are unremarkable. Other: No intraperitoneal free fluid. No intraperitoneal free gas. No organized fluid collection. Musculoskeletal: No abdominal wall hernia or abnormality. No suspicious lytic or blastic osseous lesions. No acute displaced fracture. Interval worsening of T7, T9, T11 compression fractures. Stable T6, T8, T12, L1, L2 compression fractures. Multilevel severe degenerative changes of the spine with multilevel intervertebral disc space vacuum phenomenon. Severe right L5-S1 osseous neural foraminal stenosis. Intramedullary nail fixation of the left proximal femur partially visualized. IMPRESSION: 1. Persistent bilateral trace pleural effusions with associated passive atelectasis. 2. Tiny hiatal hernia. 3. Colonic diverticulosis with no acute diverticulitis. 4. Interval worsening of T7, T9, T11 compression fractures. Stable T6, T8, T12, L1, L2 compression fractures. Correlate with point tenderness to palpation to evaluate for an acute component. 5. Severe multilevel degenerative changes of the spine. 6.  Aortic Atherosclerosis (ICD10-I70.0). Electronically Signed   By: Iven Finn M.D.   On: 01/15/2022 22:22   DG Chest Portable 1 View  Result Date: 01/15/2022 CLINICAL DATA:  Epigastric discomfort and generalized body pain EXAM: PORTABLE CHEST 1 VIEW COMPARISON:  Radiograph 11/27/2021. FINDINGS: Stable cardiomediastinal silhouette. Aortic atherosclerotic calcification. Unchanged left basilar atelectasis and small left pleural effusion or pleural thickening. Right lung is  clear. No pneumothorax. Demineralization. Advanced degenerative arthritis left shoulder. IMPRESSION: Unchanged left basilar atelectasis and left pleural effusion or pleural thickening. Electronically Signed   By: Placido Sou M.D.   On: 01/15/2022 21:45   (Echo, Carotid, EGD, Colonoscopy, ERCP)    Subjective: Patient seen and examined.  Daughter at the bedside.  Denies any complaints.  Denies any nausea vomiting.  No more diarrhea.  No bowel movement for last 24 hours.  Appetite is fair.  Telemetry shows A-fib with occasional heart rate 50-60.  Eager to get out of the hospital.  Discharge Exam: Vitals:   01/21/22 0809 01/21/22 1137  BP: (!) 106/59 (!) 114/55  Pulse: 63   Resp: 18   Temp: 97.9 F (36.6 C)   SpO2: 94%    Vitals:   01/21/22 0546 01/21/22 0547 01/21/22 0809 01/21/22 1137  BP: (!) 132/92 (!) 132/92 (!) 106/59 (!) 114/55  Pulse: (!) 45  63   Resp: (!) 24  18   Temp: 98.2 F (36.8 C)  97.9 F (36.6 C)   TempSrc: Oral  Oral   SpO2: 95%  94%   Weight:      Height:        General: Pt is alert, awake, not in acute distress Cardiovascular: Irregularly irregular.  S1/S2 +, no rubs, no gallops Respiratory: CTA bilaterally, no wheezing, no rhonchi Abdominal: Soft, NT, ND, bowel sounds + Extremities: no edema, no cyanosis    The results of significant diagnostics from this hospitalization (including imaging, microbiology, ancillary and laboratory) are listed below for reference.     Microbiology: Recent Results (from the past 240 hour(s))  Resp Panel by RT-PCR (Flu A&B, Covid) Anterior Nasal Swab     Status: None   Collection Time: 01/15/22  9:30 PM   Specimen: Anterior Nasal Swab  Result Value Ref Range Status   SARS Coronavirus 2 by RT PCR NEGATIVE NEGATIVE Final    Comment: (NOTE) SARS-CoV-2 target nucleic acids are NOT DETECTED.  The SARS-CoV-2 RNA is generally detectable in upper respiratory specimens during the acute phase of infection. The  lowest concentration of SARS-CoV-2 viral copies this assay can detect is 138 copies/mL. A negative result does not preclude SARS-Cov-2 infection and should not be used as the sole basis for treatment or other patient management decisions. A negative result may occur with  improper specimen collection/handling, submission of specimen other than nasopharyngeal swab, presence of viral mutation(s) within the areas targeted by this assay, and inadequate number of viral copies(<138 copies/mL). A negative result must be combined with clinical observations, patient history, and epidemiological information. The expected result is Negative.  Fact Sheet for Patients:  EntrepreneurPulse.com.au  Fact Sheet for Healthcare Providers:  IncredibleEmployment.be  This test is no t yet approved or cleared by the [place] FDA and  has been authorized for detection and/or diagnosis of SARS-CoV-2 by FDA under an Emergency Use Authorization (EUA). This EUA will remain  in effect (meaning this test can be used) for the duration of the COVID-19 declaration under Section 564(b)(1) of the Act, 21 U.S.C.section 360bbb-3(b)(1), unless the authorization is terminated  or revoked sooner.       Influenza A by PCR NEGATIVE NEGATIVE Final   Influenza B by PCR NEGATIVE NEGATIVE Final    Comment: (NOTE) The Xpert Xpress SARS-CoV-2/FLU/RSV plus assay is intended as an aid in the diagnosis of influenza from Nasopharyngeal swab specimens and should not be used as a sole basis for treatment. Nasal washings and aspirates are unacceptable for Xpert Xpress SARS-CoV-2/FLU/RSV testing.  Fact Sheet for Patients: EntrepreneurPulse.com.au  Fact Sheet for Healthcare Providers: IncredibleEmployment.be  This test is not yet approved or cleared by the [place] FDA and has been authorized for detection and/or diagnosis of SARS-CoV-2 by FDA under  an Emergency Use Authorization (EUA). This EUA will remain in effect (meaning this test can be used) for the duration of the COVID-19 declaration under Section 564(b)(1) of the Act, 21 U.S.C. section 360bbb-3(b)(1), unless the authorization is terminated or revoked.  Performed at [facility], [place]  [address] [place], [place] [address]   Gastrointestinal Panel by PCR , Stool     Status: Abnormal   Collection Time: 01/16/22 12:27 PM   Specimen: Stool  Result Value Ref Range Status   Campylobacter species NOT DETECTED NOT DETECTED Final   Plesimonas shigelloides NOT DETECTED NOT DETECTED Final   Salmonella species NOT DETECTED NOT DETECTED Final   Yersinia enterocolitica NOT DETECTED NOT DETECTED Final   Vibrio species NOT DETECTED NOT DETECTED Final   Vibrio cholerae NOT DETECTED NOT DETECTED Final   Enteroaggregative E coli (EAEC) NOT DETECTED NOT DETECTED Final   Enteropathogenic E coli (EPEC) NOT DETECTED NOT DETECTED Final   Enterotoxigenic E coli (ETEC) NOT DETECTED NOT DETECTED Final   Shiga like toxin producing E coli (STEC) NOT DETECTED NOT DETECTED Final   Shigella/Enteroinvasive E coli (EIEC) NOT DETECTED NOT DETECTED Final   Cryptosporidium NOT DETECTED NOT DETECTED Final   Cyclospora cayetanensis NOT DETECTED NOT DETECTED Final   Entamoeba histolytica NOT DETECTED NOT DETECTED Final   Giardia lamblia NOT DETECTED NOT DETECTED Final   Adenovirus F40/41 NOT DETECTED NOT DETECTED Final   Astrovirus NOT DETECTED NOT DETECTED Final   Norovirus GI/GII NOT DETECTED NOT DETECTED Final   Rotavirus A DETECTED (A) NOT DETECTED Final   Sapovirus (I, II, IV, and V) NOT DETECTED NOT DETECTED Final    Comment: Performed at [facility], [address] [place], [place] [address]  C Difficile Quick Screen w PCR reflex     Status: None   Collection Time: 01/16/22 12:34 PM   Specimen: Stool  Result Value Ref Range Status   C Diff antigen NEGATIVE NEGATIVE Final   C  Diff toxin NEGATIVE NEGATIVE Final   C Diff interpretation No C. difficile detected.  Final    Comment: Performed at [facility], [place] [address], [place], [place] [address]     Labs: BNP (last 3 results) Recent Labs    02/09/21 1810 01/15/22 2135  BNP 356.9* [id number]   Basic Metabolic Panel: Recent Labs  Lab 01/15/22 2320 01/17/22 0515 01/18/22 0137 01/19/22 0126 01/21/22 0659  NA 139 136 136 138 134*  K 3.5 3.4* 4.5 3.3* 4.5  CL 108 107 107 104 104  CO2 23 23 22 25 26   GLUCOSE 130* 104* 137* 116* 119*  BUN 10 17 21 13  7*  CREATININE 0.65 0.81 0.82 0.69 0.74  CALCIUM 9.0 8.7* 9.1 8.5* 8.8*  MG  --   --  1.5* 1.5* 2.0  PHOS  --   --  2.5  --  3.2   Liver Function Tests: Recent Labs  Lab 01/15/22 2320  AST 22  ALT 12  ALKPHOS 77  BILITOT 1.3*  PROT 6.3*  ALBUMIN 3.5   Recent Labs  Lab 01/15/22 2320  LIPASE 32   No results for input(s): "AMMONIA" in the last 168 hours. CBC: Recent Labs  Lab 01/15/22 2116 01/17/22 0515 01/18/22 0137 01/21/22 0336  WBC 7.5 5.5 4.5 6.5  NEUTROABS 5.7  --   --  2.9  HGB 14.7 11.9* 11.6* 12.0  HCT 43.0 35.4* 33.7* 34.7*  MCV 104.6* 104.4* 102.7* 104.2*  PLT 123* 108* 98* 90*   Cardiac Enzymes: Recent Labs  Lab 01/18/22 0137  CKTOTAL 40   BNP: Invalid input(s): "POCBNP" CBG: No results for input(s): "GLUCAP" in the last 168 hours. D-Dimer No results for input(s): "DDIMER" in the last 72 hours. Hgb A1c No results for input(s): "HGBA1C" in the last 72  hours. Lipid Profile No results for input(s): "CHOL", "HDL", "LDLCALC", "TRIG", "CHOLHDL", "LDLDIRECT" in the last 72 hours. Thyroid function studies No results for input(s): "TSH", "T4TOTAL", "T3FREE", "THYROIDAB" in the last 72 hours.  Invalid input(s): "FREET3" Anemia work up No results for input(s): "VITAMINB12", "FOLATE", "FERRITIN", "TIBC", "IRON", "RETICCTPCT" in the last 72 hours. Urinalysis    Component Value Date/Time   COLORURINE YELLOW  01/16/2022 1300   APPEARANCEUR HAZY (A) 01/16/2022 1300   LABSPEC 1.024 01/16/2022 1300   PHURINE 5.0 01/16/2022 1300   GLUCOSEU NEGATIVE 01/16/2022 1300   HGBUR MODERATE (A) 01/16/2022 1300   BILIRUBINUR NEGATIVE 01/16/2022 1300   KETONESUR 20 (A) 01/16/2022 1300   PROTEINUR 30 (A) 01/16/2022 1300   NITRITE NEGATIVE 01/16/2022 1300   LEUKOCYTESUR MODERATE (A) 01/16/2022 1300   Sepsis Labs Recent Labs  Lab 01/15/22 2116 01/17/22 0515 01/18/22 0137 01/21/22 0336  WBC 7.5 5.5 4.5 6.5   Microbiology Recent Results (from the past 240 hour(s))  Resp Panel by RT-PCR (Flu A&B, Covid) Anterior Nasal Swab     Status: None   Collection Time: 01/15/22  9:30 PM   Specimen: Anterior Nasal Swab  Result Value Ref Range Status   SARS Coronavirus 2 by RT PCR NEGATIVE NEGATIVE Final    Comment: (NOTE) SARS-CoV-2 target nucleic acids are NOT DETECTED.  The SARS-CoV-2 RNA is generally detectable in upper respiratory specimens during the acute phase of infection. The lowest concentration of SARS-CoV-2 viral copies this assay can detect is 138 copies/mL. A negative result does not preclude SARS-Cov-2 infection and should not be used as the sole basis for treatment or other patient management decisions. A negative result may occur with  improper specimen collection/handling, submission of specimen other than nasopharyngeal swab, presence of viral mutation(s) within the areas targeted by this assay, and inadequate number of viral copies(<138 copies/mL). A negative result must be combined with clinical observations, patient history, and epidemiological information. The expected result is Negative.  Fact Sheet for Patients:  BloggerCourse.com  Fact Sheet for Healthcare Providers:  SeriousBroker.it  This test is no t yet approved or cleared by the [place] FDA and  has been authorized for detection and/or diagnosis of SARS-CoV-2 by FDA  under an Emergency Use Authorization (EUA). This EUA will remain  in effect (meaning this test can be used) for the duration of the COVID-19 declaration under Section 564(b)(1) of the Act, 21 U.S.C.section 360bbb-3(b)(1), unless the authorization is terminated  or revoked sooner.       Influenza A by PCR NEGATIVE NEGATIVE Final   Influenza B by PCR NEGATIVE NEGATIVE Final    Comment: (NOTE) The Xpert Xpress SARS-CoV-2/FLU/RSV plus assay is intended as an aid in the diagnosis of influenza from Nasopharyngeal swab specimens and should not be used as a sole basis for treatment. Nasal washings and aspirates are unacceptable for Xpert Xpress SARS-CoV-2/FLU/RSV testing.  Fact Sheet for Patients: BloggerCourse.com  Fact Sheet for Healthcare Providers: SeriousBroker.it  This test is not yet approved or cleared by the [place] FDA and has been authorized for detection and/or diagnosis of SARS-CoV-2 by FDA under an Emergency Use Authorization (EUA). This EUA will remain in effect (meaning this test can be used) for the duration of the COVID-19 declaration under Section 564(b)(1) of the Act, 21 U.S.C. section 360bbb-3(b)(1), unless the authorization is terminated or revoked.  Performed at [facility], [address], [place], [place] [address]   Gastrointestinal Panel by PCR , Stool  Status: Abnormal   Collection Time: 01/16/22 12:27 PM   Specimen: Stool  Result Value Ref Range Status   Campylobacter species NOT DETECTED NOT DETECTED Final   Plesimonas shigelloides NOT DETECTED NOT DETECTED Final   Salmonella species NOT DETECTED NOT DETECTED Final   Yersinia enterocolitica NOT DETECTED NOT DETECTED Final   Vibrio species NOT DETECTED NOT DETECTED Final   Vibrio cholerae NOT DETECTED NOT DETECTED Final   Enteroaggregative E coli (EAEC) NOT DETECTED NOT DETECTED Final   Enteropathogenic E coli (EPEC) NOT DETECTED NOT  DETECTED Final   Enterotoxigenic E coli (ETEC) NOT DETECTED NOT DETECTED Final   Shiga like toxin producing E coli (STEC) NOT DETECTED NOT DETECTED Final   Shigella/Enteroinvasive E coli (EIEC) NOT DETECTED NOT DETECTED Final   Cryptosporidium NOT DETECTED NOT DETECTED Final   Cyclospora cayetanensis NOT DETECTED NOT DETECTED Final   Entamoeba histolytica NOT DETECTED NOT DETECTED Final   Giardia lamblia NOT DETECTED NOT DETECTED Final   Adenovirus F40/41 NOT DETECTED NOT DETECTED Final   Astrovirus NOT DETECTED NOT DETECTED Final   Norovirus GI/GII NOT DETECTED NOT DETECTED Final   Rotavirus A DETECTED (A) NOT DETECTED Final   Sapovirus (I, II, IV, and V) NOT DETECTED NOT DETECTED Final    Comment: Performed at [facility], [address] [place], [place] [address]  C Difficile Quick Screen w PCR reflex     Status: None   Collection Time: 01/16/22 12:34 PM   Specimen: Stool  Result Value Ref Range Status   C Diff antigen NEGATIVE NEGATIVE Final   C Diff toxin NEGATIVE NEGATIVE Final   C Diff interpretation No C. difficile detected.  Final    Comment: Performed at [facility], [place] [address], [place], [place] [address]     Time coordinating discharge: 35 minutes  SIGNED:   Barb Merino, MD  [facility] 01/21/2022, 11:42 AM

## 2022-01-21 NOTE — Progress Notes (Signed)
Contacted Howerter, MD regarding MN dose of q6 30 mg PO cardizem d/t pt's HR remaining mostly in 40's & 50's since ~ 22:00. Received message from [facility], MD to hold MN dose & admin parameters will be added to hold if HR<60. At 5:47 when administering 6AM dose HR 110's-120's. Dayshift RN to be updated

## 2022-01-21 NOTE — Progress Notes (Signed)
TRH night cross cover note:   I was contacted by patient's RN requesting clarification regarding her next scheduled dose of diltiazem 30 mg p.o. every 6 hours,, which would be due now.  RN notes that patient's heart rates have been persistently in the 50s, with nonsustained transient decreases to the high 40s.  Blood pressure remains stable, most recent systolic blood pressures noted to be in the 120s mmHg.  No report of associated new symptoms.   I asked that this current scheduled dose of diltiazem be held, and I subsequently modified her order for scheduled oral diltiazem to include hold parameters, requesting that it be held for heart rates less than 60 bpm.    Newton Pigg, DO Hospitalist

## 2022-01-21 NOTE — [place] FL2 (Addendum)
[place] MEDICAID FL2 LEVEL OF CARE FORM     IDENTIFICATION  Patient Name: Charlotte Powell Birthdate: [birth year] Sex: female Admission Date (Current Location): 01/15/2022  [facility] and [place] Number:  Producer, television/film/video and Address:  [facility], [address], [place], [place] [address]      Provider Number: [id number]  Attending Physician Name and Address:  Dorcas Carrow, MD  Relative Name and Phone Number:  [name] (daughter) [phone]    Current Level of Care: Other (Comment) (ALF) Recommended Level of Care: Assisted Living Facility ([facility]) Prior Approval Number:    Date Approved/Denied:   PASRR Number:    Discharge Plan: Other (Comment) (ALF)    Current Diagnoses: Patient Active Problem List   Diagnosis Date Noted   Rotavirus infection 01/17/2022   Nausea, vomiting, and diarrhea 01/16/2022   Compression fracture of spine (HCC) 01/16/2022   Thrombocytopenia (HCC) 01/16/2022   Hypothyroidism 01/16/2022   Nausea vomiting and diarrhea 01/16/2022   Acute exacerbation of chronic low back pain 11/30/2021   Chronic left shoulder pain 11/30/2021   Obtundation 11/28/2021   Medication adverse effect 11/27/2021   PAF (paroxysmal atrial fibrillation) (HCC) 04/22/2021   Chest pain of uncertain etiology 04/22/2021   Coronary artery disease involving native coronary artery of native heart without angina pectoris 04/22/2021   Chronic anticoagulation 04/22/2021   Atrial fibrillation with rapid ventricular response (HCC) 04/14/2021   Left upper arm pain 04/14/2021   Secondary hypercoagulable state (HCC) 03/21/2021   Viral gastroenteritis 02/09/2021   DNR (do not resuscitate)/DNI(Do Not Intubate) 02/09/2021   HLD (hyperlipidemia) 02/09/2021   CAD (coronary artery disease) 02/09/2021   Persistent atrial fibrillation (HCC) 11/16/2020   HTN (hypertension) 11/16/2020    Orientation RESPIRATION BLADDER Height & Weight     Self, Time,  Situation, Place (WDL)  Normal Incontinent, External catheter (External Urinary Catheter) Weight: 135 lb (61.2 kg) (prior to admission) Height:  5\' 3"  (160 cm)  BEHAVIORAL SYMPTOMS/MOOD NEUROLOGICAL BOWEL NUTRITION STATUS      Continent Diet: Regular diet, nutritional supplements   AMBULATORY STATUS COMMUNICATION OF NEEDS Skin   Limited Assist Verbally Other (Comment) (Ecchymosis,arm,R,L,Wound/Incision LDAs,Wound/Incision open or dehiced (IAD) Groin,L,R)                       Personal Care Assistance Level of Assistance  Bathing, Feeding, Dressing Bathing Assistance: Limited assistance Feeding assistance: Independent (can feed self) Dressing Assistance: Limited assistance     Functional Limitations Info  Sight, Hearing, Speech Sight Info: Adequate (WDL) Hearing Info: Adequate (WDL) Speech Info: Adequate    SPECIAL CARE FACTORS FREQUENCY  PT (By licensed PT), OT (By licensed OT)    See HH PT orders            Contractures Contractures Info: Not present    Additional Factors Info  Code Status, Allergies, Isolation Precautions Code Status Info: DNR Allergies Info: Baclofen,Amoxicillin,Hydrocodone     Isolation Precautions Info: UV disinfection added 01/16/2022     TAKE these medications     acetaminophen 500 MG tablet Commonly known as: TYLENOL Take 1,000 mg by mouth every 8 (eight) hours as needed for fever (pain). What changed: Another medication with the same name was removed. Continue taking this medication, and follow the directions you see here.    Anti-Diarrheal 2 MG tablet Generic drug: loperamide Take 2 mg by mouth every 6 (six) hours as needed for diarrhea or loose stools. What changed: Another medication with the  same name was removed. Continue taking this medication, and follow the directions you see here.    Biofreeze 4 % Gel Generic drug: Menthol (Topical Analgesic) Apply 1 Application topically in the morning and at bedtime. Apply to left upper  quadrant abdominal area liberally.    Cholecalciferol 25 MCG (1000 UT) capsule Take 1,000 Units by mouth daily.    diltiazem 30 MG tablet Commonly known as: Cardizem Take 1 tablet (30 mg total) by mouth 3 (three) times daily.    Eliquis 2.5 MG Tabs tablet Generic drug: apixaban Take 1 tablet (2.5 mg total) by mouth 2 (two) times daily.    fluticasone 50 MCG/ACT nasal spray Commonly known as: FLONASE Place 1 spray into both nostrils daily.    folic acid 800 MCG tablet Commonly known as: FOLVITE Take 800 mcg by mouth daily.    gabapentin 300 MG capsule Commonly known as: NEURONTIN Take 300 mg by mouth at bedtime.    guaiFENesin 100 MG/5ML liquid Commonly known as: ROBITUSSIN Take 10 mLs by mouth every 6 (six) hours as needed for cough or to loosen phlegm.    levothyroxine 25 MCG tablet Commonly known as: SYNTHROID Take 25 mcg by mouth daily.    lidocaine 5 % Commonly known as: LIDODERM Place 1 patch onto the skin every morning. Apply 1 patch to lower back every morning and remove 12 hours later. What changed: Another medication with the same name was removed. Continue taking this medication, and follow the directions you see here.    melatonin 5 MG Tabs Take 5 mg by mouth at bedtime.    nitroGLYCERIN 0.4 MG SL tablet Commonly known as: NITROSTAT Place 0.4 mg under the tongue every 5 (five) minutes as needed for chest pain.    omeprazole 20 MG capsule Commonly known as: PRILOSEC Take 20 mg by mouth daily before breakfast.    ondansetron 4 MG tablet Commonly known as: ZOFRAN Take 1 tablet (4 mg total) by mouth every 8 (eight) hours as needed for nausea or vomiting.    phenylephrine-shark liver oil-mineral oil-petrolatum 0.25-14-74.9 % rectal ointment Commonly known as: PREPARATION H Place 1 Application rectally 4 (four) times daily as needed for hemorrhoids (rectal pain).    Polyethylene Glycol 3350 Powd Take 17 g by mouth daily as needed (constipation).     Probiotic 250 MG Caps Take 250 mg by mouth in the morning and at bedtime.    rosuvastatin 20 MG tablet Commonly known as: CRESTOR Take 1 tablet (20 mg total) by mouth daily.    Senexon-S 8.6-50 MG tablet Generic drug: senna-docusate Take 1 tablet by mouth 3 (three) times a week. Monday, Wednesday, and Friday at bedtime.    simethicone 125 MG chewable tablet Commonly known as: MYLICON Chew 125 mg by mouth 4 (four) times daily as needed for flatulence.           Relevant Imaging Results:  Relevant Lab Results:   Additional Information [id number]  Delilah Shan, LCSWA

## 2022-01-21 NOTE — Progress Notes (Signed)
Report called to [facility], to [facility].  Awaiting PTAR for transfer.

## 2022-01-21 NOTE — TOC Transition Note (Addendum)
Transition of Care [facility]) - CM/SW Discharge Note   Patient Details  Name: Charlotte Powell MRN: [id number] Date of Birth: 12/16/1928  [facility]) CM/SW Contact:  Delilah Shan, LCSWA Phone Number: 01/21/2022, 11:46 AM   Clinical Narrative:     Patient will DC to: [facility]   Anticipated DC date: 01/21/2022  Family notified: [name]   Transport by: Sharin Mons   ?  Per MD patient ready for DC to [facility]. RN, patient, patient's family, and facility notified of DC. Discharge Summary sent to facility. RN given number for report tele# [phone] ask for RN or medtech RM# [phone]. DC packet on chart.DNR signed by MD attached to patients DC packet. Ambulance transport requested for patient.  CSW signing off.    Final next level of care: Assisted Living ([facility]) Barriers to Discharge: No Barriers Identified   Patient Goals and CMS Choice Patient states their goals for this hospitalization and ongoing recovery are:: ALF CMS Medicare.gov Compare Post Acute Care list provided to:: Patient Represenative (must comment) (Patients daughter [name]) Choice offered to / list presented to : Adult Children (Pateints daughter [name])  Discharge Placement              Patient chooses bed at:  ([facility]) Patient to be transferred to facility by: PTAR Name of family member notified: [name] Patient and family notified of of transfer: 01/21/22  Discharge Plan and Services In-house Referral: Clinical Social Work                                   Social Determinants of Health (SDOH) Interventions     Readmission Risk Interventions     No data to display

## 2022-01-22 ENCOUNTER — Telehealth: Payer: Self-pay

## 2022-01-22 NOTE — Telephone Encounter (Signed)
Transition Care Management Follow-up Telephone Call Date of discharge and from where: [facility] 01/21/2022 How have you been since you were released from the hospital? weak Any questions or concerns? No  Items Reviewed: Did the pt receive and understand the discharge instructions provided? Yes  Medications obtained and verified? Yes  Other? No  Any new allergies since your discharge? No  Dietary orders reviewed? Yes Do you have support at home? Yes   Home Care and Equipment/Supplies: Were home health services ordered? no If so, what is the name of the agency? N/a  Has the agency set up a time to come to the patient's home? yes Were any new equipment or medical supplies ordered?  No What is the name of the medical supply agency? N/a Were you able to get the supplies/equipment? not applicable Do you have any questions related to the use of the equipment or supplies? No  Functional Questionnaire: (I = Independent and D = Dependent) ADLs: D  Bathing/Dressing- D  Meal Prep- D  Eating- D  Maintaining continence- D  Transferring/Ambulation- D  Managing Meds- D  Follow up appointments reviewed:  PCP Hospital f/u appt confirmed? No  Daughter refused appt Specialist Hospital f/u appt confirmed? No  Are transportation arrangements needed? No  If their condition worsens, is the pt aware to call PCP or go to the Emergency Dept.? Yes Was the patient provided with contact information for the PCP's office or ED? Yes Was to pt encouraged to call back with questions or concerns? Yes Karena Addison, LPN [facility] Nurse Health Advisor Direct Dial [phone]

## 2022-02-12 ENCOUNTER — Encounter (HOSPITAL_COMMUNITY): Payer: Self-pay | Admitting: Emergency Medicine

## 2022-02-12 ENCOUNTER — Observation Stay (HOSPITAL_COMMUNITY): Payer: Medicare HMO

## 2022-02-12 ENCOUNTER — Other Ambulatory Visit: Payer: Self-pay

## 2022-02-12 ENCOUNTER — Emergency Department (HOSPITAL_COMMUNITY): Payer: Medicare HMO

## 2022-02-12 ENCOUNTER — Inpatient Hospital Stay (HOSPITAL_COMMUNITY)
Admission: EM | Admit: 2022-02-12 | Discharge: 2022-02-26 | DRG: 309 | Disposition: A | Discharge status: Nursing Home (Includes ICF) | Payer: Medicare HMO | Source: Skilled Nursing Facility | Attending: Internal Medicine | Admitting: Internal Medicine

## 2022-02-12 DIAGNOSIS — Z79890 Hormone replacement therapy (postmenopausal): Secondary | ICD-10-CM

## 2022-02-12 DIAGNOSIS — E860 Dehydration: Secondary | ICD-10-CM | POA: Diagnosis present

## 2022-02-12 DIAGNOSIS — Z885 Allergy status to narcotic agent status: Secondary | ICD-10-CM

## 2022-02-12 DIAGNOSIS — E871 Hypo-osmolality and hyponatremia: Secondary | ICD-10-CM | POA: Diagnosis present

## 2022-02-12 DIAGNOSIS — I4891 Unspecified atrial fibrillation: Secondary | ICD-10-CM | POA: Diagnosis present

## 2022-02-12 DIAGNOSIS — E039 Hypothyroidism, unspecified: Secondary | ICD-10-CM | POA: Diagnosis present

## 2022-02-12 DIAGNOSIS — R109 Unspecified abdominal pain: Secondary | ICD-10-CM | POA: Diagnosis present

## 2022-02-12 DIAGNOSIS — E876 Hypokalemia: Secondary | ICD-10-CM | POA: Diagnosis present

## 2022-02-12 DIAGNOSIS — E8809 Other disorders of plasma-protein metabolism, not elsewhere classified: Secondary | ICD-10-CM | POA: Diagnosis present

## 2022-02-12 DIAGNOSIS — I10 Essential (primary) hypertension: Secondary | ICD-10-CM | POA: Diagnosis present

## 2022-02-12 DIAGNOSIS — R110 Nausea: Secondary | ICD-10-CM | POA: Diagnosis present

## 2022-02-12 DIAGNOSIS — Z6824 Body mass index (BMI) 24.0-24.9, adult: Secondary | ICD-10-CM

## 2022-02-12 DIAGNOSIS — I2510 Atherosclerotic heart disease of native coronary artery without angina pectoris: Secondary | ICD-10-CM | POA: Diagnosis present

## 2022-02-12 DIAGNOSIS — Z955 Presence of coronary angioplasty implant and graft: Secondary | ICD-10-CM

## 2022-02-12 DIAGNOSIS — D649 Anemia, unspecified: Secondary | ICD-10-CM | POA: Diagnosis present

## 2022-02-12 DIAGNOSIS — G629 Polyneuropathy, unspecified: Secondary | ICD-10-CM | POA: Diagnosis present

## 2022-02-12 DIAGNOSIS — Z515 Encounter for palliative care: Secondary | ICD-10-CM

## 2022-02-12 DIAGNOSIS — I4821 Permanent atrial fibrillation: Principal | ICD-10-CM | POA: Diagnosis present

## 2022-02-12 DIAGNOSIS — Z880 Allergy status to penicillin: Secondary | ICD-10-CM

## 2022-02-12 DIAGNOSIS — Z888 Allergy status to other drugs, medicaments and biological substances status: Secondary | ICD-10-CM

## 2022-02-12 DIAGNOSIS — K219 Gastro-esophageal reflux disease without esophagitis: Secondary | ICD-10-CM | POA: Diagnosis present

## 2022-02-12 DIAGNOSIS — I482 Chronic atrial fibrillation, unspecified: Secondary | ICD-10-CM | POA: Diagnosis not present

## 2022-02-12 DIAGNOSIS — Z1152 Encounter for screening for COVID-19: Secondary | ICD-10-CM

## 2022-02-12 DIAGNOSIS — R079 Chest pain, unspecified: Principal | ICD-10-CM

## 2022-02-12 DIAGNOSIS — Z7901 Long term (current) use of anticoagulants: Secondary | ICD-10-CM

## 2022-02-12 DIAGNOSIS — I495 Sick sinus syndrome: Secondary | ICD-10-CM | POA: Diagnosis present

## 2022-02-12 DIAGNOSIS — Z66 Do not resuscitate: Secondary | ICD-10-CM | POA: Diagnosis present

## 2022-02-12 DIAGNOSIS — R627 Adult failure to thrive: Secondary | ICD-10-CM | POA: Diagnosis present

## 2022-02-12 DIAGNOSIS — I959 Hypotension, unspecified: Secondary | ICD-10-CM | POA: Diagnosis present

## 2022-02-12 DIAGNOSIS — Z79899 Other long term (current) drug therapy: Secondary | ICD-10-CM

## 2022-02-12 DIAGNOSIS — J9811 Atelectasis: Secondary | ICD-10-CM | POA: Diagnosis present

## 2022-02-12 DIAGNOSIS — E785 Hyperlipidemia, unspecified: Secondary | ICD-10-CM | POA: Diagnosis present

## 2022-02-12 LAB — BRAIN NATRIURETIC PEPTIDE: B Natriuretic Peptide: 235.1 pg/mL — ABNORMAL HIGH (ref 0.0–100.0)

## 2022-02-12 LAB — RESP PANEL BY RT-PCR (RSV, FLU A&B, COVID)  RVPGX2
Influenza A by PCR: NEGATIVE
Influenza B by PCR: NEGATIVE
Resp Syncytial Virus by PCR: NEGATIVE
SARS Coronavirus 2 by RT PCR: NEGATIVE

## 2022-02-12 LAB — COMPREHENSIVE METABOLIC PANEL
ALT: 17 U/L (ref 0–44)
AST: 39 U/L (ref 15–41)
Albumin: 2.9 g/dL — ABNORMAL LOW (ref 3.5–5.0)
Alkaline Phosphatase: 125 U/L (ref 38–126)
Anion gap: 17 — ABNORMAL HIGH (ref 5–15)
BUN: 20 mg/dL (ref 8–23)
CO2: 21 mmol/L — ABNORMAL LOW (ref 22–32)
Calcium: 8.8 mg/dL — ABNORMAL LOW (ref 8.9–10.3)
Chloride: 94 mmol/L — ABNORMAL LOW (ref 98–111)
Creatinine, Ser: 0.95 mg/dL (ref 0.44–1.00)
GFR, Estimated: 57 mL/min — ABNORMAL LOW (ref >60)
Glucose, Bld: 81 mg/dL (ref 70–99)
Potassium: 3.6 mmol/L (ref 3.5–5.1)
Sodium: 132 mmol/L — ABNORMAL LOW (ref 135–145)
Total Bilirubin: 2.2 mg/dL — ABNORMAL HIGH (ref 0.3–1.2)
Total Protein: 6.2 g/dL — ABNORMAL LOW (ref 6.5–8.1)

## 2022-02-12 LAB — BASIC METABOLIC PANEL
Anion gap: 9 (ref 5–15)
BUN: 20 mg/dL (ref 8–23)
CO2: 27 mmol/L (ref 22–32)
Calcium: 8.9 mg/dL (ref 8.9–10.3)
Chloride: 95 mmol/L — ABNORMAL LOW (ref 98–111)
Creatinine, Ser: 0.93 mg/dL (ref 0.44–1.00)
GFR, Estimated: 58 mL/min — ABNORMAL LOW (ref >60)
Glucose, Bld: 84 mg/dL (ref 70–99)
Potassium: 3.7 mmol/L (ref 3.5–5.1)
Sodium: 131 mmol/L — ABNORMAL LOW (ref 135–145)

## 2022-02-12 LAB — CBC
HCT: 41 % (ref 36.0–46.0)
Hemoglobin: 13.4 g/dL (ref 12.0–15.0)
MCH: 35.2 pg — ABNORMAL HIGH (ref 26.0–34.0)
MCHC: 32.7 g/dL (ref 30.0–36.0)
MCV: 107.6 fL — ABNORMAL HIGH (ref 80.0–100.0)
Platelets: 166 10*3/uL (ref 150–400)
RBC: 3.81 MIL/uL — ABNORMAL LOW (ref 3.87–5.11)
RDW: 14.2 % (ref 11.5–15.5)
WBC: 11.6 10*3/uL — ABNORMAL HIGH (ref 4.0–10.5)
nRBC: 0 % (ref 0.0–0.2)

## 2022-02-12 LAB — URINALYSIS, ROUTINE W REFLEX MICROSCOPIC
Bilirubin Urine: NEGATIVE
Glucose, UA: NEGATIVE mg/dL
Hgb urine dipstick: NEGATIVE
Ketones, ur: NEGATIVE mg/dL
Leukocytes,Ua: NEGATIVE
Nitrite: NEGATIVE
Protein, ur: 100 mg/dL — AB
Specific Gravity, Urine: 1.03 (ref 1.005–1.030)
pH: 5 (ref 5.0–8.0)

## 2022-02-12 LAB — I-STAT CHEM 8, ED
BUN: 25 mg/dL — ABNORMAL HIGH (ref 8–23)
Calcium, Ion: 0.92 mmol/L — ABNORMAL LOW (ref 1.15–1.40)
Chloride: 95 mmol/L — ABNORMAL LOW (ref 98–111)
Creatinine, Ser: 0.8 mg/dL (ref 0.44–1.00)
Glucose, Bld: 81 mg/dL (ref 70–99)
HCT: 39 % (ref 36.0–46.0)
Hemoglobin: 13.3 g/dL (ref 12.0–15.0)
Potassium: 3.7 mmol/L (ref 3.5–5.1)
Sodium: 131 mmol/L — ABNORMAL LOW (ref 135–145)
TCO2: 26 mmol/L (ref 22–32)

## 2022-02-12 LAB — TROPONIN I (HIGH SENSITIVITY)
Troponin I (High Sensitivity): 49 ng/L — ABNORMAL HIGH (ref <18)
Troponin I (High Sensitivity): 47 ng/L — ABNORMAL HIGH (ref <18)

## 2022-02-12 LAB — LACTIC ACID, PLASMA
Lactic Acid, Venous: 1.9 mmol/L (ref 0.5–1.9)
Lactic Acid, Venous: 2.3 mmol/L (ref 0.5–1.9)
Lactic Acid, Venous: 1.5 mmol/L (ref 0.5–1.9)

## 2022-02-12 LAB — TSH: TSH: 4.239 u[IU]/mL (ref 0.350–4.500)

## 2022-02-12 LAB — PROCALCITONIN: Procalcitonin: <0.1 ng/mL

## 2022-02-12 LAB — MAGNESIUM: Magnesium: 1.5 mg/dL — ABNORMAL LOW (ref 1.7–2.4)

## 2022-02-12 MED ORDER — DILTIAZEM LOAD VIA INFUSION
5.0000 mg | Freq: Once | INTRAVENOUS | Status: AC
  Administered 2022-02-12: 5 mg via INTRAVENOUS
  Filled 2022-02-12: qty 5

## 2022-02-12 MED ORDER — LEVALBUTEROL HCL 0.63 MG/3ML IN NEBU
0.6300 mg | INHALATION_SOLUTION | Freq: Four times a day (QID) | RESPIRATORY_TRACT | Status: DC | PRN
  Administered 2022-02-13 – 2022-02-14 (×2): 0.63 mg via RESPIRATORY_TRACT
  Filled 2022-02-12 (×2): qty 3

## 2022-02-12 MED ORDER — ACETAMINOPHEN 325 MG PO TABS
650.0000 mg | ORAL_TABLET | Freq: Four times a day (QID) | ORAL | Status: DC | PRN
  Administered 2022-02-21 – 2022-02-26 (×4): 650 mg via ORAL
  Filled 2022-02-12 (×6): qty 2

## 2022-02-12 MED ORDER — LACTATED RINGERS IV BOLUS
500.0000 mL | Freq: Once | INTRAVENOUS | Status: AC
  Administered 2022-02-12: 500 mL via INTRAVENOUS

## 2022-02-12 MED ORDER — ONDANSETRON HCL 4 MG PO TABS: 4.0000 mg | ORAL_TABLET | Freq: Four times a day (QID) | ORAL | Status: DC | PRN

## 2022-02-12 MED ORDER — DOCUSATE SODIUM 100 MG PO CAPS
100.0000 mg | ORAL_CAPSULE | Freq: Two times a day (BID) | ORAL | Status: DC
  Administered 2022-02-12 – 2022-02-24 (×13): 100 mg via ORAL
  Filled 2022-02-12 (×23): qty 1

## 2022-02-12 MED ORDER — ONDANSETRON HCL 4 MG/2ML IJ SOLN
4.0000 mg | Freq: Four times a day (QID) | INTRAMUSCULAR | Status: DC | PRN
  Administered 2022-02-12 – 2022-02-13 (×3): 4 mg via INTRAVENOUS
  Filled 2022-02-12 (×4): qty 2

## 2022-02-12 MED ORDER — MELATONIN 5 MG PO TABS
5.0000 mg | ORAL_TABLET | Freq: Every day | ORAL | Status: DC
  Administered 2022-02-12 – 2022-02-25 (×14): 5 mg via ORAL
  Filled 2022-02-12 (×14): qty 1

## 2022-02-12 MED ORDER — GUAIFENESIN ER 600 MG PO TB12
600.0000 mg | ORAL_TABLET | Freq: Two times a day (BID) | ORAL | Status: DC
  Administered 2022-02-12 – 2022-02-26 (×27): 600 mg via ORAL
  Filled 2022-02-12 (×28): qty 1

## 2022-02-12 MED ORDER — DILTIAZEM HCL-DEXTROSE 125-5 MG/125ML-% IV SOLN (PREMIX)
5.0000 mg/h | INTRAVENOUS | Status: DC
  Administered 2022-02-12: 5 mg/h via INTRAVENOUS
  Administered 2022-02-12: 15 mg/h via INTRAVENOUS
  Administered 2022-02-13: 10 mg/h via INTRAVENOUS
  Administered 2022-02-13: 15 mg/h via INTRAVENOUS
  Filled 2022-02-12 (×4): qty 125

## 2022-02-12 MED ORDER — ROSUVASTATIN CALCIUM 20 MG PO TABS
20.0000 mg | ORAL_TABLET | Freq: Every day | ORAL | Status: DC
  Administered 2022-02-13 – 2022-02-26 (×13): 20 mg via ORAL
  Filled 2022-02-12 (×14): qty 1

## 2022-02-12 MED ORDER — FLUTICASONE PROPIONATE 50 MCG/ACT NA SUSP
1.0000 | Freq: Every day | NASAL | Status: DC
  Administered 2022-02-14 – 2022-02-25 (×2): 1 via NASAL
  Filled 2022-02-12: qty 16

## 2022-02-12 MED ORDER — PANTOPRAZOLE SODIUM 40 MG PO TBEC
40.0000 mg | DELAYED_RELEASE_TABLET | Freq: Every day | ORAL | Status: DC
  Administered 2022-02-13: 40 mg via ORAL
  Filled 2022-02-12: qty 1

## 2022-02-12 MED ORDER — APIXABAN 2.5 MG PO TABS
2.5000 mg | ORAL_TABLET | Freq: Two times a day (BID) | ORAL | Status: DC
  Administered 2022-02-12 – 2022-02-17 (×11): 2.5 mg via ORAL
  Filled 2022-02-12 (×11): qty 1

## 2022-02-12 MED ORDER — ACETAMINOPHEN 650 MG RE SUPP: 650.0000 mg | Freq: Four times a day (QID) | RECTAL | Status: DC | PRN

## 2022-02-12 MED ORDER — GABAPENTIN 300 MG PO CAPS
300.0000 mg | ORAL_CAPSULE | Freq: Every day | ORAL | Status: DC
  Administered 2022-02-12 – 2022-02-25 (×14): 300 mg via ORAL
  Filled 2022-02-12 (×14): qty 1

## 2022-02-12 NOTE — Consult Note (Addendum)
Cardiology Consultation   Patient ID: Charlotte Powell MRN: [id number]; DOB: 1928-05-11  Admit date: 02/12/2022 Date of Consult: 02/12/2022  PCP:  Sandrea Hughs, NP   [facility] Cardiologist:  Berniece Salines, DO        Patient Profile:   Charlotte Powell is a 88 y.o. female with a hx of permanent atrial fibrillation (prior h/o elevated rates in setting of underlying infection), CAD s/p DES to RCA 2004, HTN, HLD, GERD, diverticulitis, prior C diff infection, PNA (11/2020 felt due to aspiration), ?CVA not seen on CT 11/2020 who is being seen 02/12/2022 for the evaluation of atrial fibrillation at the request of Dr. Jeanell Sparrow.  History of Present Illness:   [name] was remotely diagnosed with CAD and PAF many years ago prior to moving to [place]. She has been managed with a rate control strategy as permanent atrial fibrillation, though EKGs from 11/2020 did show sinus tach. However, from 01/2021 onward, she has been persistently out of rhythm. She has had several prior admissions for medical issues, primarily infectious with underlying issues with intermittent encephalopathy. During her 11/2020 admisson there was concern for possible CVA not seen on CT head; MRI not pursued as family did not desire aggressive care and she was already on Eliquis for h/o PAF. This is her third admission in the last 3 months. She was admitted 11/2021 with encephalopathy and compression fractures causing radiculopathy. She was admitted 11/29-12/5/23 with rotavirus gastroenteritis, hypomagnesemia, hypokalemia and dehydration. During several of her admissions it appears that she is in RVR on presentation, then swings towards bradycardic after aggressive HR control with treatment of the underlying condition. She was discharged to ALF. Last echo 01/17/22 showed EF 60-65%, moderate LAE, mild MR, otherwise not well visualized.  On 12/8 she was seen as an outpatient for cough and not feeling well,  found to have PNA and possible BLE cellulitis. She was treated with a 10 day course of doxycycline. However, over the last few days she has had continued general malaise and weakness. She also describes a vague sensation in her chest and abdomen that has been there chronically but has a hard time describing the length. It is worse with palpation and does not worsen with activity (limited anyway) or inspiration. Her legs have been swollen since recent hospital discharge as well but has been primarily sitting with her legs down including in a recliner while sleeping. Due to progressive weakness she came to the ER. Labs reveal worsening hyponatremia of 132, hypoalbuminemia of 2.9, Elevated Tbili to 2.2, leukocytosis of 11.6, Hgb 13.4, plt 166. Covid/flu/RVS negative. CXR shows concern for left basilar infiltrate. Blood cx and lactic acid are pending. She was 92% on 3L on arrival with soft BP at times. She received 5mg  IV bolus of diltiazem and subsequent drip at 15mg /hr. HR is ranging 85-1teens presently. hsTroponin 49, repeat pending. She denies any acute complaint - just doesn't feel her best presently. She continues to have a productive sounding cough on exam. She has not eaten well in the last few days due to poor appetite.   Past Medical History:  Diagnosis Date   Atrial fibrillation (HCC)    CAD (coronary artery disease)    Diverticulitis    GERD (gastroesophageal reflux disease)    Heart disease    HLD (hyperlipidemia)    HTN (hypertension)    Hypothyroidism    Vitamin D deficiency     Past Surgical History:  Procedure Laterality Date   ANKLE  FRACTURE SURGERY     CHOLECYSTECTOMY     CORONARY STENT PLACEMENT     FEMUR FRACTURE SURGERY     WRIST FRACTURE SURGERY       Home Medications:  Prior to Admission medications   Medication Sig Start Date End Date Taking? Authorizing Provider  acetaminophen (TYLENOL) 500 MG tablet Take 1,000 mg by mouth 3 (three) times daily.   Yes [provider]  apixaban (ELIQUIS) 2.5 MG TABS tablet Take 1 tablet (2.5 mg total) by mouth 2 (two) times daily. 11/30/20  Yes Ghimire, Henreitta Leber, MD  Cholecalciferol 25 MCG (1000 UT) capsule Take 1,000 Units by mouth daily.   Yes [provider]  diltiazem (CARDIZEM) 30 MG tablet Take 1 tablet (30 mg total) by mouth 3 (three) times daily. 12/02/21 05/31/22 Yes Rick Duff, MD  doxycycline (VIBRAMYCIN) 100 MG capsule Take 100 mg by mouth 2 (two) times daily. 02/01/22  Yes [provider]  fluticasone (FLONASE) 50 MCG/ACT nasal spray Place 1 spray into both nostrils daily.   Yes [provider]  folic acid (FOLVITE) Q000111Q MCG tablet Take 800 mcg by mouth daily.   Yes [provider]  furosemide (LASIX) 20 MG tablet Take 20 mg by mouth daily.   Yes [provider]  gabapentin (NEURONTIN) 300 MG capsule Take 300 mg by mouth at bedtime.   Yes [provider]  guaifenesin (ROBITUSSIN) 100 MG/5ML syrup Take 200 mg by mouth every 6 (six) hours as needed for cough or congestion.   Yes [provider]  levothyroxine (SYNTHROID) 25 MCG tablet Take 25 mcg by mouth daily.   Yes [provider]  lidocaine (LIDODERM) 5 % Place 1 patch onto the skin every morning. Apply 1 patch to lower back every morning and remove 12 hours later.   Yes [provider]  loperamide (ANTI-DIARRHEAL) 2 MG tablet Take 2 mg by mouth every 6 (six) hours as needed for diarrhea or loose stools.   Yes [provider]  melatonin 5 MG TABS Take 5 mg by mouth at bedtime.   Yes [provider]  Menthol, Topical Analgesic, (BIOFREEZE) 4 % GEL Apply 1 Application topically in the morning and at bedtime. Apply to left upper quadrant abdominal area liberally.   Yes [provider]  nitroGLYCERIN (NITROSTAT) 0.4 MG SL tablet Place 0.4 mg under the tongue every 5 (five) minutes as needed for chest pain. 08/07/20  Yes [provider]   NON FORMULARY Take 1 Dose by mouth daily. Magic Cup ice cream   Yes [provider]  omeprazole (PRILOSEC) 20 MG capsule Take 20 mg by mouth daily before breakfast.   Yes [provider]  ondansetron (ZOFRAN) 4 MG tablet Take 1 tablet (4 mg total) by mouth every 8 (eight) hours as needed for nausea or vomiting. 01/21/22  Yes Barb Merino, MD  phenylephrine-shark liver oil-mineral oil-petrolatum (PREPARATION H) 0.25-14-74.9 % rectal ointment Place 1 Application rectally 4 (four) times daily as needed for hemorrhoids (rectal pain).   Yes [provider]  Polyethylene Glycol 3350 POWD Take 17 g by mouth daily as needed (constipation).   Yes [provider]  rosuvastatin (CRESTOR) 20 MG tablet Take 1 tablet (20 mg total) by mouth daily. 12/03/21  Yes Rick Duff, MD  Saccharomyces boulardii (PROBIOTIC) 250 MG CAPS Take 250 mg by mouth in the morning and at bedtime.   Yes [provider]  senna-docusate (SENEXON-S) 8.6-50 MG tablet Take 1 tablet by mouth  3 (three) times a week. Monday, Wednesday, and Friday at bedtime.   Yes [provider]  simethicone (MYLICON) 0000000 MG chewable tablet Chew 125 mg by mouth 4 (four) times daily as needed for flatulence.   Yes [provider]  guaiFENesin (MUCINEX) 600 MG 12 hr tablet Take 1 tablet (600 mg total) by mouth every 12 (twelve) hours as needed for cough or to loosen phlegm. Patient not taking: Reported on 02/12/2022 01/21/22   Barb Merino, MD    Inpatient Medications: Scheduled Meds:  Continuous Infusions:  diltiazem (CARDIZEM) infusion 15 mg/hr (02/12/22 1448)   PRN Meds:   Allergies:    Allergies  Allergen Reactions   Baclofen Other (See Comments)    hypoactive encephalopathy due to baclofen   Amoxicillin Other (See Comments)    unknown   Hydrocodone Other (See Comments)    Confusion and AMS    Social History:   Social History   Socioeconomic History   Marital  status: Widowed    Spouse name: Not on file   Number of children: Not on file   Years of education: Not on file   Highest education level: Not on file  Occupational History   Occupation: retired  Tobacco Use   Smoking status: Never   Smokeless tobacco: Never  Substance and Sexual Activity   Alcohol use: Never   Drug use: Never   Sexual activity: Not on file  Other Topics Concern   Not on file  Social History Narrative   Not on file   Social Determinants of Health   Financial Resource Strain: Not on file  Food Insecurity: No Food Insecurity (01/19/2022)   Hunger Vital Sign    Worried About Running Out of Food in the Last Year: Never true    Ran Out of Food in the Last Year: Never true  Transportation Needs: No Transportation Needs (01/19/2022)   PRAPARE - Hydrologist (Medical): No    Lack of Transportation (Non-Medical): No  Physical Activity: Not on file  Stress: Not on file  Social Connections: Not on file  Intimate Partner Violence: Not At Risk (01/19/2022)   Humiliation, Afraid, Rape, and Kick questionnaire    Fear of Current or Ex-Partner: No    Emotionally Abused: No    Physically Abused: No    Sexually Abused: No    Family History:   Family History  Problem Relation Age of Onset   Stomach cancer Neg Hx      ROS:  Please see the history of present illness.  All other ROS reviewed and negative.     Physical Exam/Data:   Vitals:   02/12/22 1515 02/12/22 1545 02/12/22 1600 02/12/22 1615  BP: 120/71 102/72 111/87 100/68  Pulse: (!) 35 (!) 160 100 (!) 51  Resp: (!) 27 18 (!) 25 (!) 24  Temp:    98.6 F (37 C)  TempSrc:    Oral  SpO2: (!) 81% 97% 99% 100%  Weight:      Height:        Intake/Output Summary (Last 24 hours) at 02/12/2022 1625 Last data filed at 02/12/2022 1548 Gross per 24 hour  Intake 500 ml  Output --  Net 500 ml      02/12/2022   12:23 PM 01/19/2022    6:57 PM 11/27/2021    9:13 AM  Last 3 Weights   Weight (lbs) 137 lb 135 lb 142 lb  Weight (kg) 62.143 kg 61.236 kg 64.411 kg  Body mass index is 24.27 kg/m.  General: Well developed, well nourished, in no acute distress. Head: Normocephalic, atraumatic, sclera non-icteric, no xanthomas, nares are without discharge. Neck: Negative for carotid bruits. JVP not elevated. Lungs: +Expiratory wheezing, rales bilaterally at bases. Breathing is unlabored on O2. Heart: Tachycardic, irregular, S1 S2. Soft SEM, no rubs or gallops.  Abdomen: Soft, non-tender, non-distended with normoactive bowel sounds. No rebound/guarding. Extremities: No clubbing or cyanosis. 1+ BLE edema with erythema to LE shins. Distal pedal pulses are 2+ and equal bilaterally. Neuro: Alert and oriented X 3. Moves all extremities spontaneously. Psych:  Responds to questions appropriately with a normal affect.   EKG:  The EKG was personally reviewed and demonstrates:  couarse atrial fibrillation 104bpm, nonspecific STTW changes  Telemetry:  Telemetry was personally reviewed and demonstrates:  afib  Relevant CV Studies: 2D echo 01/17/22  1. Limited study due to pt with N/V; only limited parasternal images obtained; suggest fu study when pt able.   2. Left ventricular ejection fraction, by estimation, is 60 to 65%. The left ventricle has normal function. The left ventricle has no regional  wall motion abnormalities. Left ventricular diastolic function could not be evaluated.   3. Right ventricular systolic function was not well visualized. The right  ventricular size is not well visualized.   4. Left atrial size was moderately dilated.   5. The mitral valve is normal in structure. Mild mitral valve regurgitation.   6. The aortic valve is tricuspid. Aortic valve regurgitation is not visualized. Aortic valve sclerosis is present, with no evidence of aortic  valve stenosis.   Laboratory Data:  High Sensitivity Troponin:   Recent Labs  Lab 01/15/22 2320 02/12/22 1257   TROPONINIHS 6 49*     Chemistry Recent Labs  Lab 02/12/22 1257 02/12/22 1328  NA 132* 131*  K 3.6 3.7  CL 94* 95*  CO2 21*  --   GLUCOSE 81 81  BUN 20 25*  CREATININE 0.95 0.80  CALCIUM 8.8*  --   GFRNONAA 57*  --   ANIONGAP 17*  --     Recent Labs  Lab 02/12/22 1257  PROT 6.2*  ALBUMIN 2.9*  AST 39  ALT 17  ALKPHOS 125  BILITOT 2.2*   Lipids No results for input(s): "CHOL", "TRIG", "HDL", "LABVLDL", "LDLCALC", "CHOLHDL" in the last 168 hours.  Hematology Recent Labs  Lab 02/12/22 1257 02/12/22 1328  WBC 11.6*  --   RBC 3.81*  --   HGB 13.4 13.3  HCT 41.0 39.0  MCV 107.6*  --   MCH 35.2*  --   MCHC 32.7  --   RDW 14.2  --   PLT 166  --    Thyroid No results for input(s): "TSH", "FREET4" in the last 168 hours.  BNPNo results for input(s): "BNP", "PROBNP" in the last 168 hours.  DDimer No results for input(s): "DDIMER" in the last 168 hours.   Radiology/Studies:  DG Chest Port 1 View  Result Date: 02/12/2022 CLINICAL DATA:  Chest pain. EXAM: PORTABLE CHEST 1 VIEW COMPARISON:  January 15, 2022. FINDINGS: Stable cardiomegaly. Right lung is clear. Mild left basilar atelectasis or infiltrate is noted with possible effusion. Severe degenerative changes seen involving the left glenohumeral joint. IMPRESSION: Left basilar opacity as described above. Electronically Signed   By: Marijo Conception M.D.   On: 02/12/2022 15:10     Assessment and Plan:   1. Generalized weakness, malaise, poor oral intake, hyponatremia, leukocytosis - question of L  basilar opacity/PNA on CXR, also possible LE cellulitis? - IM is planning to review workup to initiate antibiotic therapy and CTA to r/o PE (will be helpful to better discern lung fields as well) - VS look like SIRS physiology with tachycardia, low-normal BP, O2 supplementation - added pro-calcitonin to labs to help guide whether we are looking at superimposed bacterial infection from recent PNA  2. Rapid atrial  fibrillation - has permanent atrial fibrillation with tendency to develop RVR during periods of infection - need to be cautious with rate control as historically when her underlying condition is treated, she tends to swing towards significant bradycardia (30s-40s) with aggressive rate control - therefore would continue diltiazem drip at 15mg /hr for now, follow HR, with treatment/evaluation of #1 - will order pharm to follow apixaban dosing as she is on the border for 2.5/5mg  dosing - check thyroid function  3. Lower extremity edema - recent dx of cellulitis, with erythema, s/p doxycycline treatment - daughter reports improvement with elevation, may be partially dependent +/- related to third spacing with low albumin as well - add BNP to labs but hold off acute diuresis given low-normal BPs presently, can reconsider as this improves - received IV fluids in ED due to poor oral intake recently - recent LVEF normal, RV not well visualized due to n/v during last study 01/17/22 - per d/w MD, hold off repeating given that study was quite recent  4. Elevated troponin, h/o CAD with remote stent - vague chest discomfort noted with some reproducibility - anticipate conservative management, follow troponin trends - not on ASA due to concomitant Eliquis - hold on BB with wheezing - continue statin  5. Mild MR - follow clinically, re-eval as OP  Risk Assessment/Risk Scores:        CHA2DS2-VASc Score = 5 though possibly 7 if counting possible CVA not seen on CT  This indicates a 7.2% annual risk of stroke. The patient's score is based upon: CHF History: 0 HTN History: 1 Diabetes History: 0 Stroke History: 0 Vascular Disease History: 1 Age Score: 2 Gender Score: 1         For questions or updates, please contact [facility] Please consult [email or web address] for contact info under    Signed, 14/1/23, PA-C  02/12/2022 4:25 PM  Attending Note:   The patient was seen and  examined.  Agree with assessment and plan as noted above.  Changes made to the above note as needed.  Patient seen and independently examined with 02/14/2022, PA .   We discussed all aspects of the encounter. I agree with the assessment and plan as stated above.    Rapid atrial fib:  pt has permanent afib.  Has had multiple admissions over the past year for various infections that caused her to have rapid afib.    I suspect this admission will be similar  Agree the Dilt drip to help control her ventricular response  As she clinically improves she typically develops a slower ventricular response.  We will watch her closely to hopefully avoid bradycardia as she improves.  2.  Possible pneumonia:   noted by CXR .   Agree with CTA of the lungs to look for pulmonary embolus.  Will also be able to better assess her lungs.   3.  Leg edema.:  has bilat leg edema associated with erythema.  Warm to the touch.  Possible early cellulitis   WBC is mildly elevated.   4.  Chest pain:  has a hx of CAD / stenting in the past .  Symptoms do not sound like angina Her troponins are mildly elevated but the trend is flat and is not c/w ACS.  Likely demand ischemia in the setting of possible pneumonia and rapid atrial fib .     I have spent a total of 40 minutes with patient reviewing hospital  notes , telemetry, EKGs, labs and examining patient as well as establishing an assessment and plan that was discussed with the patient.  > 50% of time was spent in direct patient care.    Thayer Headings, Brooke Bonito., MD, [facility] 02/12/2022, 5:42 PM [id number] N[address]  [place] Pager [phone]

## 2022-02-12 NOTE — ED Triage Notes (Signed)
Pt BIB EMS from [facility], EMS called out for chest pain, when placed on EMS monitor, patient was found to be in AFIB RVR with a rate of 170. Patient has been having increased swelling in the lower extremities in the lower limbs. History of pneumonia.  EMS Vitals 124/92 94% RA  Rate 140-170

## 2022-02-12 NOTE — ED Notes (Signed)
ED TO INPATIENT HANDOFF REPORT  ED Nurse Name and Phone #: Duwayne HeckDanielle [phone]  S Name/Age/Gender Charlotte Powell 88 y.o. female Room/Bed: 028C/028C  Code Status   Code Status: Prior  Home/SNF/Other Skilled nursing facility Patient oriented to: self, place, time, and situation Is this baseline? Yes   Triage Complete: Triage complete  Chief Complaint Chronic atrial fibrillation with RVR (HCC) [I48.20]  Triage Note [facility] EMS called out for chest pain, when placed on EMS monitor, patient was found to be in AFIB RVR with a rate of 170. Patient has been having increased swelling in the lower extremities in the lower limbs. History of pneumonia.  EMS Vitals 124/92 94% RA  Rate 140-170   Allergies Allergies  Allergen Reactions   Baclofen Other (See Comments)    hypoactive encephalopathy due to baclofen   Amoxicillin Other (See Comments)    unknown   Hydrocodone Other (See Comments)    Confusion and AMS    Level of Care/Admitting Diagnosis ED Disposition     ED Disposition  Admit   Condition  --   Comment  Hospital Area: [facility] [[id number]]  Level of Care: Progressive [102]  Admit to Progressive based on following criteria: CARDIOVASCULAR & THORACIC of moderate stability with acute coronary syndrome symptoms/low risk myocardial infarction/hypertensive urgency/arrhythmias/heart failure potentially compromising stability and stable post cardiovascular intervention patients.  May place patient in observation at [facility] or [facility] if equivalent level of care is available:: No  Covid Evaluation: Asymptomatic - no recent exposure (last 10 days) testing not required  Diagnosis: Chronic atrial fibrillation with RVR [facility](HCC) [[id number]]  Admitting Physician: Rolly SalterPATEL, PRANAV M [[id number]]  Attending Physician: Rolly SalterPATEL, PRANAV M [[id number]]          B Medical/Surgery History Past Medical History:  Diagnosis Date   Atrial fibrillation  (HCC)    CAD (coronary artery disease)    Diverticulitis    GERD (gastroesophageal reflux disease)    Heart disease    HLD (hyperlipidemia)    HTN (hypertension)    Hypothyroidism    Vitamin D deficiency    Past Surgical History:  Procedure Laterality Date   ANKLE FRACTURE SURGERY     CHOLECYSTECTOMY     CORONARY STENT PLACEMENT     FEMUR FRACTURE SURGERY     WRIST FRACTURE SURGERY       A IV Location/Drains/Wounds Patient Lines/Drains/Airways Status     Active Line/Drains/Airways     Name Placement date Placement time Site Days   Peripheral IV 02/12/22 24 G Left;Posterior Hand 02/12/22  1225  Hand  less than 1   Peripheral IV 02/12/22 20 G 1.88" Anterior;Proximal;Right Forearm 02/12/22  1259  Forearm  less than 1   Wound / Incision (Open or Dehisced) (IAD) Incontinence Associated Dermatitis Groin Left;Right --  --  Groin  --            Intake/Output Last 24 hours  Intake/Output Summary (Last 24 hours) at 02/12/2022 1635 Last data filed at 02/12/2022 1548 Gross per 24 hour  Intake 500 ml  Output --  Net 500 ml    Labs/Imaging Results for orders placed or performed during the hospital encounter of 02/12/22 (from the past 48 hour(s))  CBC     Status: Abnormal   Collection Time: 02/12/22 12:57 PM  Result Value Ref Range   WBC 11.6 (H) 4.0 - 10.5 K/uL   RBC 3.81 (L) 3.87 - 5.11 MIL/uL   Hemoglobin 13.4 12.0 -  15.0 g/dL   HCT 83.4 19.6 - 22.2 %   MCV 107.6 (H) 80.0 - 100.0 fL   MCH 35.2 (H) 26.0 - 34.0 pg   MCHC 32.7 30.0 - 36.0 g/dL   RDW 97.9 89.2 - 11.9 %   Platelets 166 150 - 400 K/uL   nRBC 0.0 0.0 - 0.2 %    Comment: Performed at [facility], [address], [place], [place] [address]  Comprehensive metabolic panel     Status: Abnormal   Collection Time: 02/12/22 12:57 PM  Result Value Ref Range   Sodium 132 (L) 135 - 145 mmol/L   Potassium 3.6 3.5 - 5.1 mmol/L   Chloride 94 (L) 98 - 111 mmol/L   CO2 21 (L) 22 - 32 mmol/L   Glucose, Bld 81  70 - 99 mg/dL    Comment: Glucose reference range applies only to samples taken after fasting for at least 8 hours.   BUN 20 8 - 23 mg/dL   Creatinine, Ser 8.14 0.44 - 1.00 mg/dL   Calcium 8.8 (L) 8.9 - 10.3 mg/dL   Total Protein 6.2 (L) 6.5 - 8.1 g/dL   Albumin 2.9 (L) 3.5 - 5.0 g/dL   AST 39 15 - 41 U/L   ALT 17 0 - 44 U/L   Alkaline Phosphatase 125 38 - 126 U/L   Total Bilirubin 2.2 (H) 0.3 - 1.2 mg/dL   GFR, Estimated 57 (L) >60 mL/min    Comment: (NOTE) Calculated using the CKD-EPI Creatinine Equation (2021)    Anion gap 17 (H) 5 - 15    Comment: Performed at [facility], [address] [place], [place] [address]  Troponin I (High Sensitivity)     Status: Abnormal   Collection Time: 02/12/22 12:57 PM  Result Value Ref Range   Troponin I (High Sensitivity) 49 (H) <18 ng/L    Comment: (NOTE) Elevated high sensitivity troponin I (hsTnI) values and significant  changes across serial measurements may suggest ACS but many other  chronic and acute conditions are known to elevate hsTnI results.  Refer to the "Links" section for chest pain algorithms and additional  guidance. Performed at [facility], [address] [place], [place] [address]   Resp panel by RT-PCR (RSV, Flu A&B, Covid) Anterior Nasal Swab     Status: None   Collection Time: 02/12/22 12:57 PM   Specimen: Anterior Nasal Swab  Result Value Ref Range   SARS Coronavirus 2 by RT PCR NEGATIVE NEGATIVE    Comment: (NOTE) SARS-CoV-2 target nucleic acids are NOT DETECTED.  The SARS-CoV-2 RNA is generally detectable in upper respiratory specimens during the acute phase of infection. The lowest concentration of SARS-CoV-2 viral copies this assay can detect is 138 copies/mL. A negative result does not preclude SARS-Cov-2 infection and should not be used as the sole basis for treatment or other patient management decisions. A negative result may occur with  improper specimen collection/handling, submission  of specimen other than nasopharyngeal swab, presence of viral mutation(s) within the areas targeted by this assay, and inadequate number of viral copies(<138 copies/mL). A negative result must be combined with clinical observations, patient history, and epidemiological information. The expected result is Negative.  Fact Sheet for Patients:  BloggerCourse.com  Fact Sheet for Healthcare Providers:  SeriousBroker.it  This test is no t yet approved or cleared by the [place] FDA and  has been authorized for detection and/or diagnosis of SARS-CoV-2 by FDA under an  Emergency Use Authorization (EUA). This EUA will remain  in effect (meaning this test can be used) for the duration of the COVID-19 declaration under Section 564(b)(1) of the Act, 21 U.S.C.section 360bbb-3(b)(1), unless the authorization is terminated  or revoked sooner.       Influenza A by PCR NEGATIVE NEGATIVE   Influenza B by PCR NEGATIVE NEGATIVE    Comment: (NOTE) The Xpert Xpress SARS-CoV-2/FLU/RSV plus assay is intended as an aid in the diagnosis of influenza from Nasopharyngeal swab specimens and should not be used as a sole basis for treatment. Nasal washings and aspirates are unacceptable for Xpert Xpress SARS-CoV-2/FLU/RSV testing.  Fact Sheet for Patients: BloggerCourse.com  Fact Sheet for Healthcare Providers: SeriousBroker.it  This test is not yet approved or cleared by the [place] FDA and has been authorized for detection and/or diagnosis of SARS-CoV-2 by FDA under an Emergency Use Authorization (EUA). This EUA will remain in effect (meaning this test can be used) for the duration of the COVID-19 declaration under Section 564(b)(1) of the Act, 21 U.S.C. section 360bbb-3(b)(1), unless the authorization is terminated or revoked.     Resp Syncytial Virus by PCR NEGATIVE NEGATIVE    Comment:  (NOTE) Fact Sheet for Patients: BloggerCourse.com  Fact Sheet for Healthcare Providers: SeriousBroker.it  This test is not yet approved or cleared by the [place] FDA and has been authorized for detection and/or diagnosis of SARS-CoV-2 by FDA under an Emergency Use Authorization (EUA). This EUA will remain in effect (meaning this test can be used) for the duration of the COVID-19 declaration under Section 564(b)(1) of the Act, 21 U.S.C. section 360bbb-3(b)(1), unless the authorization is terminated or revoked.  Performed at [facility], [address] [place], [place] [address]   I-stat chem 8, ED (not at [facility], [facility] or [facility])     Status: Abnormal   Collection Time: 02/12/22  1:28 PM  Result Value Ref Range   Sodium 131 (L) 135 - 145 mmol/L   Potassium 3.7 3.5 - 5.1 mmol/L   Chloride 95 (L) 98 - 111 mmol/L   BUN 25 (H) 8 - 23 mg/dL   Creatinine, Ser 2.77 0.44 - 1.00 mg/dL   Glucose, Bld 81 70 - 99 mg/dL    Comment: Glucose reference range applies only to samples taken after fasting for at least 8 hours.   Calcium, Ion 0.92 (L) 1.15 - 1.40 mmol/L   TCO2 26 22 - 32 mmol/L   Hemoglobin 13.3 12.0 - 15.0 g/dL   HCT 82.4 23.5 - 36.1 %  Urinalysis, Routine w reflex microscopic Urine, In & Out Cath     Status: Abnormal   Collection Time: 02/12/22  3:23 PM  Result Value Ref Range   Color, Urine AMBER (A) YELLOW    Comment: BIOCHEMICALS MAY BE AFFECTED BY COLOR   APPearance HAZY (A) CLEAR   Specific Gravity, Urine 1.030 1.005 - 1.030   pH 5.0 5.0 - 8.0   Glucose, UA NEGATIVE NEGATIVE mg/dL   Hgb urine dipstick NEGATIVE NEGATIVE   Bilirubin Urine NEGATIVE NEGATIVE   Ketones, ur NEGATIVE NEGATIVE mg/dL   Protein, ur 443 (A) NEGATIVE mg/dL   Nitrite NEGATIVE NEGATIVE   Leukocytes,Ua NEGATIVE NEGATIVE   RBC / HPF 0-5 0 - 5 RBC/hpf   WBC, UA 11-20 0 - 5 WBC/hpf   Bacteria, UA RARE (A) NONE SEEN   Squamous Epithelial / LPF  0-5 0 - 5 /HPF   Mucus PRESENT    Granular Casts,  UA PRESENT     Comment: Performed at [facility], [address], [place], [place] [address]   DG Chest Port 1 View  Result Date: 02/12/2022 CLINICAL DATA:  Chest pain. EXAM: PORTABLE CHEST 1 VIEW COMPARISON:  January 15, 2022. FINDINGS: Stable cardiomegaly. Right lung is clear. Mild left basilar atelectasis or infiltrate is noted with possible effusion. Severe degenerative changes seen involving the left glenohumeral joint. IMPRESSION: Left basilar opacity as described above. Electronically Signed   By: Lupita Raider M.D.   On: 02/12/2022 15:10    Pending Labs Unresulted Labs (From admission, onward)     Start     Ordered   02/12/22 1258  Blood culture (routine x 2)  BLOOD CULTURE X 2,   R      02/12/22 1257   02/12/22 1257  Lactic acid, plasma  Now then every 2 hours,   R      02/12/22 1257            Vitals/Pain Today's Vitals   02/12/22 1515 02/12/22 1545 02/12/22 1600 02/12/22 1615  BP: 120/71 102/72 111/87 100/68  Pulse: (!) 35 (!) 160 100 (!) 51  Resp: (!) 27 18 (!) 25 (!) 24  Temp:    98.6 F (37 C)  TempSrc:    Oral  SpO2: (!) 81% 97% 99% 100%  Weight:      Height:      PainSc:        Isolation Precautions No active isolations  Medications Medications  diltiazem (CARDIZEM) 1 mg/mL load via infusion 5 mg (5 mg Intravenous Bolus from Bag 02/12/22 1316)    And  diltiazem (CARDIZEM) 125 mg in dextrose 5% 125 mL (1 mg/mL) infusion (15 mg/hr Intravenous Rate/Dose Change 02/12/22 1448)  lactated ringers bolus 500 mL (0 mLs Intravenous Stopped 02/12/22 1548)    Mobility manual wheelchair Low fall risk   Focused Assessments    R Recommendations: See Admitting Provider Note  Report given to:   Additional Notes:

## 2022-02-12 NOTE — ED Notes (Signed)
Per Fannie Knee, RN. Patient given a 10mg  bolus of Cardizem with Ray, MD at bedside. Rate increased to 10mg /hr.

## 2022-02-12 NOTE — Progress Notes (Signed)
   02/12/22 2121  Provider Notification  Provider Name/Title C. Margo Aye, MD  Date Provider Notified 02/12/22  Time Provider Notified 2121  Method of Notification Page  Notification Reason Critical Result  Test performed and critical result Lactic Acid=2.3  Date Critical Result Received 02/12/22  Time Critical Result Received 2108  Provider response Other (Comment) (awaiting responst)

## 2022-02-12 NOTE — ED Notes (Signed)
Bolus of 15mg  of cardizem given by this RN, per , MD at bedside.

## 2022-02-12 NOTE — ED Provider Notes (Signed)
[facility] EMERGENCY DEPARTMENT Provider Note   CSN: [id number] Arrival date & time: 02/12/22  1219     History  Chief Complaint  Patient presents with   Chest Pain    Kla Bily is a 88 y.o. female.  HPI 88 year old female history of A-fib, on chronic anticoagulation who was hospitalized 1 month ago for rotavirus.  Since returning back to her assisted living care facility she was subsequently diagnosed with pneumonia.  She completed a course of antibiotics for her pneumonia.  She has had some improvement with her coughing and has not had any recent fever.  Over the past several days she is felt increasingly weak and had some chest discomfort.  She has not been taking p.o. well.  Her appetite has been poor since the initial illness last month.  How     Home Medications Prior to Admission medications   Medication Sig Start Date End Date Taking? Authorizing Provider  acetaminophen (TYLENOL) 500 MG tablet Take 1,000 mg by mouth 3 (three) times daily.   Yes [provider]  apixaban (ELIQUIS) 2.5 MG TABS tablet Take 1 tablet (2.5 mg total) by mouth 2 (two) times daily. 11/30/20  Yes Ghimire, Werner Lean, MD  Cholecalciferol 25 MCG (1000 UT) capsule Take 1,000 Units by mouth daily.   Yes [provider]  diltiazem (CARDIZEM) 30 MG tablet Take 1 tablet (30 mg total) by mouth 3 (three) times daily. 12/02/21 05/31/22 Yes Marolyn Haller, MD  doxycycline (VIBRAMYCIN) 100 MG capsule Take 100 mg by mouth 2 (two) times daily. 02/01/22  Yes [provider]  fluticasone (FLONASE) 50 MCG/ACT nasal spray Place 1 spray into both nostrils daily.   Yes [provider]  folic acid (FOLVITE) 800 MCG tablet Take 800 mcg by mouth daily.   Yes [provider]  furosemide (LASIX) 20 MG tablet Take 20 mg by mouth daily.   Yes [provider]  gabapentin (NEURONTIN) 300 MG capsule Take 300 mg by mouth at bedtime.   Yes [provider]  guaifenesin (ROBITUSSIN) 100 MG/5ML syrup Take 200 mg by mouth every 6 (six) hours as needed for cough or congestion.   Yes [provider]  levothyroxine (SYNTHROID) 25 MCG tablet Take 25 mcg by mouth daily.   Yes [provider]  lidocaine (LIDODERM) 5 % Place 1 patch onto the skin every morning. Apply 1 patch to lower back every morning and remove 12 hours later.   Yes [provider]  loperamide (ANTI-DIARRHEAL) 2 MG tablet Take 2 mg by mouth every 6 (six) hours as needed for diarrhea or loose stools.   Yes [provider]  melatonin 5 MG TABS Take 5 mg by mouth at bedtime.   Yes [provider]  Menthol, Topical Analgesic, (BIOFREEZE) 4 % GEL Apply 1 Application topically in the morning and at bedtime. Apply to left upper quadrant abdominal area liberally.   Yes [provider]  nitroGLYCERIN (NITROSTAT) 0.4 MG SL tablet Place 0.4 mg under the tongue every 5 (five) minutes as needed for chest pain. 08/07/20  Yes [provider]  NON FORMULARY Take 1 Dose by mouth daily. Magic Cup ice cream   Yes [provider]  omeprazole (PRILOSEC) 20 MG capsule Take 20 mg by mouth daily before breakfast.   Yes [provider]  ondansetron (ZOFRAN) 4 MG tablet Take 1 tablet (4 mg total) by mouth every 8 (eight) hours as needed for nausea or vomiting. 01/21/22  Yes Dorcas Carrow, MD  phenylephrine-shark liver oil-mineral oil-petrolatum (PREPARATION H) 0.25-14-74.9 % rectal ointment Place 1 Application rectally 4 (four) times daily as needed for hemorrhoids (rectal pain).   Yes [provider]  Polyethylene Glycol 3350 POWD Take 17 g by mouth daily as needed (constipation).   Yes [provider]  rosuvastatin (CRESTOR) 20 MG tablet Take 1 tablet (20 mg total) by mouth daily. 12/03/21  Yes Marolyn Haller, MD  Saccharomyces boulardii (PROBIOTIC) 250 MG CAPS Take 250 mg by mouth in the morning and at  bedtime.   Yes [provider]  senna-docusate (SENEXON-S) 8.6-50 MG tablet Take 1 tablet by mouth 3 (three) times a week. Monday, Wednesday, and Friday at bedtime.   Yes [provider]  simethicone (MYLICON) 125 MG chewable tablet Chew 125 mg by mouth 4 (four) times daily as needed for flatulence.   Yes [provider]  guaiFENesin (MUCINEX) 600 MG 12 hr tablet Take 1 tablet (600 mg total) by mouth every 12 (twelve) hours as needed for cough or to loosen phlegm. Patient not taking: Reported on 02/12/2022 01/21/22   Dorcas Carrow, MD      Allergies    Baclofen, Amoxicillin, and Hydrocodone    Review of Systems   Review of Systems  Physical Exam Updated Vital Signs BP 100/68   Pulse (!) 51   Temp 98.6 F (37 C) (Oral)   Resp (!) 24   Ht 1.6 m (5\' 3" )   Wt 62.1 kg   SpO2 100%   BMI 24.27 kg/m  Physical Exam Vitals and nursing note reviewed.  Constitutional:      Appearance: She is well-developed.  HENT:     Head: Normocephalic and atraumatic.  Eyes:     Pupils: Pupils are equal, round, and reactive to light.  Cardiovascular:     Rate and Rhythm: Tachycardia present. Rhythm irregular.     Heart sounds: Normal heart sounds.  Pulmonary:     Effort: Pulmonary effort is normal.     Breath sounds: Normal breath sounds.  Abdominal:     General: Bowel sounds are normal.     Palpations: Abdomen is soft.  Musculoskeletal:     Cervical back: Normal range of motion.     Right lower leg: Edema present.     Left lower leg: Edema present.  Skin:    General: Skin is warm and dry.     Capillary Refill: Capillary refill takes less than 2 seconds.  Neurological:     General: No focal deficit present.     Mental Status: She is alert.  Psychiatric:        Mood and Affect: Mood normal.     ED Results / Procedures / Treatments   Labs (all labs ordered are listed, but only abnormal results are displayed) Labs Reviewed  CBC - Abnormal; Notable for the  following components:      Result Value   WBC 11.6 (*)    RBC 3.81 (*)    MCV 107.6 (*)    MCH 35.2 (*)    All other components within normal limits  COMPREHENSIVE METABOLIC PANEL - Abnormal; Notable for the following components:   Sodium 132 (*)    Chloride 94 (*)    CO2 21 (*)    Calcium 8.8 (*)    Total Protein 6.2 (*)    Albumin 2.9 (*)    Total Bilirubin 2.2 (*)    GFR, Estimated 57 (*)    Anion gap  17 (*)    All other components within normal limits  URINALYSIS, ROUTINE W REFLEX MICROSCOPIC - Abnormal; Notable for the following components:   Color, Urine AMBER (*)    APPearance HAZY (*)    Protein, ur 100 (*)    Bacteria, UA RARE (*)    All other components within normal limits  I-STAT CHEM 8, ED - Abnormal; Notable for the following components:   Sodium 131 (*)    Chloride 95 (*)    BUN 25 (*)    Calcium, Ion 0.92 (*)    All other components within normal limits  TROPONIN I (HIGH SENSITIVITY) - Abnormal; Notable for the following components:   Troponin I (High Sensitivity) 49 (*)    All other components within normal limits  RESP PANEL BY RT-PCR (RSV, FLU A&B, COVID)  [id number]  CULTURE, BLOOD (ROUTINE X 2)  CULTURE, BLOOD (ROUTINE X 2)  LACTIC ACID, PLASMA  LACTIC ACID, PLASMA  TROPONIN I (HIGH SENSITIVITY)    EKG EKG Interpretation  Date/Time:  Wednesday February 12 2022 12:31:40 EST Ventricular Rate:  162 PR Interval:    QRS Duration: 79 QT Interval:  244 QTC Calculation: 401 R Axis:   24 Text Interpretation: Atrial fibrillation with rapid V-rate Low voltage, extremity leads Repolarization abnormality, prob rate related Confirmed by Margarita Grizzle [phone]) on 02/12/2022 2:00:04 PM  Radiology DG Chest Port 1 View  Result Date: 02/12/2022 CLINICAL DATA:  Chest pain. EXAM: PORTABLE CHEST 1 VIEW COMPARISON:  January 15, 2022. FINDINGS: Stable cardiomegaly. Right lung is clear. Mild left basilar atelectasis or infiltrate is noted with possible effusion.  Severe degenerative changes seen involving the left glenohumeral joint. IMPRESSION: Left basilar opacity as described above. Electronically Signed   By: Lupita Raider M.D.   On: 02/12/2022 15:10    Procedures Procedures    Medications Ordered in ED Medications  diltiazem (CARDIZEM) 1 mg/mL load via infusion 5 mg (5 mg Intravenous Bolus from Bag 02/12/22 1316)    And  diltiazem (CARDIZEM) 125 mg in dextrose 5% 125 mL (1 mg/mL) infusion (15 mg/hr Intravenous Rate/Dose Change 02/12/22 1448)  lactated ringers bolus 500 mL (0 mLs Intravenous Stopped 02/12/22 1548)    ED Course/ Medical Decision Making/ A&P Clinical Course as of 02/12/22 1638  Wed Feb 12, 2022  1412 I-STAT 8 reviewed and interpreted significant for mild hyponatremia sodium 131 [DR]  1412 Patient's heart rate continues to be elevated A-fib with RVR rate approximately 150 Additional Cardizem 10 mg bolus was given and rate was increased to 10 mg/h The patient's blood pressure remained stable at 103/90 [DR]  1443 Heart rate continues between 120 and 160 Cardizem bolus at 15 and rate turned up to 15  [DR]  1606 Chest x-Rueben Kassim reviewed and interpreted significant for stable cardiomegaly with mild left basilar atelectasis or infiltrate with possible effusion noted [DR]    Clinical Course User Index [DR] Margarita Grizzle, MD                           Medical Decision Making 88 year old female history of A-fib on chronic anticoagulation who has been treated for rotavirus and pneumonia during the past 4 to 6 weeks.  She had been improving somewhat then got worse over the past several days has had some chest discomfort and generalized weakness. She is evaluated here in the ED with physical exam which shows her to be very tachycardic, EKG, and labs and x-Juelz Claar. EKG  shows A-fib with RVR initially in the rate of approximately 160.  Patient received some IV fluids and additionally has had Cardizem bolus and is on a drip.  Heart rate has  decreased from 162 and around 120.  She feels somewhat improved.  Labs are significant for a mild leukocytosis, hyponatremia with a sodium of 132 and stable hemoglobin Troponin is elevated at 48 1 A-fib RVR likely also source of patient's generalized weakness and could also be causing elevated troponin Patient is on Cardizem drip Will consult cardiology 2 generalized weakness patient has the aforementioned A-fib with RVR and also has some persistent left lower lobe infiltrate.  She has had ongoing cough.  Will consult with medicine regarding ongoing treatment 3 patient with mild hyponatremia today it is 132 and has been gradually tending down with the last prior 134 she has received normal saline for a fluid bolus here in the ED  Amount and/or Complexity of Data Reviewed Labs: ordered. Decision-making details documented in ED Course. Radiology: ordered and independent interpretation performed. Decision-making details documented in ED Course. Discussion of management or test interpretation with external provider(s): Cardiologist and hospitalist consulted Hospitalist will see for admission Cardiologist will see in consult  Risk Prescription drug management. Decision regarding hospitalization.           Final Clinical Impression(s) / ED Diagnoses Final diagnoses:  Chest pain, unspecified type  Atrial fibrillation with RVR [facility](HCC)    Rx / DC Orders ED Discharge Orders     None         Margarita Grizzleay, Riah Kehoe, MD 02/12/22 [phone]

## 2022-02-12 NOTE — H&P (Signed)
History and Physical  Patient: Charlotte Powell DOB: 02/09/1929 DOA: 02/12/2022 DOS: the patient was seen and examined on 02/12/2022 Patient coming from: ALF/ILF  Chief Complaint:  Chief Complaint  Patient presents with   Chest Pain   HPI: Charlotte Powell is a 88 y.o. female with PMH significant of chronic A-fib, CAD, GERD, HTN, HLD, hypothyroidism. Present to the hospital with complaints of shortness of breath and nausea. Also had some substernal chest pain. Found to have A-fib with RVR. Patient was recently hospitalized in early December for rotavirus gastroenteritis. Developed pneumonia after that and was treated with doxycycline. Developed swelling in her legs which is progressively worsening along with some redness. Patient was started on Lasix recently at the assisted facility. For last 2 days she is feeling fatigue and tired.  Also feels nauseated without any vomiting.  She started having some chest pain today which is why she was brought to the hospital. Currently the time of my relation chest pain has resolved.  No nausea.  No dizziness and lightheadedness with no focal deficit.  Review of Systems: As mentioned in the history of present illness. All other systems reviewed and are negative. Past Medical History:  Diagnosis Date   Atrial fibrillation (HCC)    CAD (coronary artery disease)    Diverticulitis    GERD (gastroesophageal reflux disease)    Heart disease    HLD (hyperlipidemia)    HTN (hypertension)    Hypothyroidism    Vitamin D deficiency    Past Surgical History:  Procedure Laterality Date   ANKLE FRACTURE SURGERY     CHOLECYSTECTOMY     CORONARY STENT PLACEMENT     FEMUR FRACTURE SURGERY     WRIST FRACTURE SURGERY     Social History:  reports that she has never smoked. She has never used smokeless tobacco. She reports that she does not drink alcohol and does not use drugs. Allergies  Allergen Reactions   Baclofen Other (See Comments)     hypoactive encephalopathy due to baclofen   Amoxicillin Other (See Comments)    unknown   Hydrocodone Other (See Comments)    Confusion and AMS   Family History  Problem Relation Age of Onset   Stomach cancer Neg Hx    Prior to Admission medications   Medication Sig Start Date End Date Taking? Authorizing Provider  acetaminophen (TYLENOL) 500 MG tablet Take 1,000 mg by mouth 3 (three) times daily.   Yes [provider]  apixaban (ELIQUIS) 2.5 MG TABS tablet Take 1 tablet (2.5 mg total) by mouth 2 (two) times daily. 11/30/20  Yes Ghimire, Henreitta Leber, MD  Cholecalciferol 25 MCG (1000 UT) capsule Take 1,000 Units by mouth daily.   Yes [provider]  diltiazem (CARDIZEM) 30 MG tablet Take 1 tablet (30 mg total) by mouth 3 (three) times daily. 12/02/21 05/31/22 Yes Rick Duff, MD  doxycycline (VIBRAMYCIN) 100 MG capsule Take 100 mg by mouth 2 (two) times daily. 02/01/22  Yes [provider]  fluticasone (FLONASE) 50 MCG/ACT nasal spray Place 1 spray into both nostrils daily.   Yes [provider]  folic acid (FOLVITE) [id number] MCG tablet Take 800 mcg by mouth daily.   Yes [provider]  furosemide (LASIX) 20 MG tablet Take 20 mg by mouth daily.   Yes [provider]  gabapentin (NEURONTIN) 300 MG capsule Take 300 mg by mouth at bedtime.   Yes [provider]  guaifenesin (ROBITUSSIN) 100 MG/5ML syrup Take 200 mg by  mouth every 6 (six) hours as needed for cough or congestion.   Yes [provider]  levothyroxine (SYNTHROID) 25 MCG tablet Take 25 mcg by mouth daily.   Yes [provider]  lidocaine (LIDODERM) 5 % Place 1 patch onto the skin every morning. Apply 1 patch to lower back every morning and remove 12 hours later.   Yes [provider]  loperamide (ANTI-DIARRHEAL) 2 MG tablet Take 2 mg by mouth every 6 (six) hours as needed for diarrhea or loose stools.   Yes [provider]  melatonin 5  MG TABS Take 5 mg by mouth at bedtime.   Yes [provider]  Menthol, Topical Analgesic, (BIOFREEZE) 4 % GEL Apply 1 Application topically in the morning and at bedtime. Apply to left upper quadrant abdominal area liberally.   Yes [provider]  nitroGLYCERIN (NITROSTAT) 0.4 MG SL tablet Place 0.4 mg under the tongue every 5 (five) minutes as needed for chest pain. 08/07/20  Yes [provider]  NON FORMULARY Take 1 Dose by mouth daily. Magic Cup ice cream   Yes [provider]  omeprazole (PRILOSEC) 20 MG capsule Take 20 mg by mouth daily before breakfast.   Yes [provider]  ondansetron (ZOFRAN) 4 MG tablet Take 1 tablet (4 mg total) by mouth every 8 (eight) hours as needed for nausea or vomiting. 01/21/22  Yes Dorcas Carrow, MD  phenylephrine-shark liver oil-mineral oil-petrolatum (PREPARATION H) 0.25-14-74.9 % rectal ointment Place 1 Application rectally 4 (four) times daily as needed for hemorrhoids (rectal pain).   Yes [provider]  Polyethylene Glycol 3350 POWD Take 17 g by mouth daily as needed (constipation).   Yes [provider]  rosuvastatin (CRESTOR) 20 MG tablet Take 1 tablet (20 mg total) by mouth daily. 12/03/21  Yes Marolyn Haller, MD  Saccharomyces boulardii (PROBIOTIC) 250 MG CAPS Take 250 mg by mouth in the morning and at bedtime.   Yes [provider]  senna-docusate (SENEXON-S) 8.6-50 MG tablet Take 1 tablet by mouth 3 (three) times a week. Monday, Wednesday, and Friday at bedtime.   Yes [provider]  simethicone (MYLICON) 125 MG chewable tablet Chew 125 mg by mouth 4 (four) times daily as needed for flatulence.   Yes [provider]  guaiFENesin (MUCINEX) 600 MG 12 hr tablet Take 1 tablet (600 mg total) by mouth every 12 (twelve) hours as needed for cough or to loosen phlegm. Patient not taking: Reported on 02/12/2022 01/21/22   Dorcas Carrow, MD   Physical Exam: Vitals:    02/12/22 1545 02/12/22 1600 02/12/22 1615 02/12/22 1700  BP: 102/72 111/87 100/68 (!) 114/95  Pulse: (!) 160 100 (!) 51 (!) 166  Resp: 18 (!) 25 (!) 24 (!) 24  Temp:   98.6 F (37 C)   TempSrc:   Oral   SpO2: 97% 99% 100% 99%  Weight:      Height:       General: Appear in moderate distress; no visible Abnormal Neck Mass Or lumps, Conjunctiva normal Cardiovascular: S1 and S2 Present, no Murmur, Respiratory: increased respiratory effort, Bilateral Air entry present and bilateral  Crackles, no wheezes Abdomen: Bowel Sound present, Non tender  Extremities: bilateral  Pedal edema with chronic venous stasis dermatitis appearance Neurology: alert and oriented to time, place, and person Gait not checked due to patient safety concerns   Data Reviewed: Since last encounter, pertinent lab results CBC and BMP   . I have ordered test including  CBC, BMP, TSH, free T4  . I have discussed pt's care plan and test results with cardiology  . I have ordered imaging echocardiogram and lower extremity Doppler  .   Assessment and Plan Chronic A-fib with RVR. Started on Cardizem infusion in the ER. Will continue with the same for now. Cardiology consulted. Recently had an echocardiogram which showed preserved EF. Possibility of her respiratory condition pneumonia/PE causing RVR cannot be ruled out. Monitor on telemetry. If the blood pressure does not tolerate Cardizem 84 amiodarone drip.  Bilateral basal crackles. Left-sided infiltrate. Bilateral lower extremity edema. Recently hospitalized. Procalcitonin is negative. Less likely pneumonia. Possibility of a PE cannot be ruled out. Will get a CT chest PE protocol. Lower extremity Doppler also ordered. Lower extremity does not appear to be actively infected for now.  Chronic anticoagulation. For A-fib on Eliquis. Continue present  Chronic neuropathy. Continue gabapentin nightly.  Hypothyroidism. Continue Synthroid. Check TSH and free  T4.  GERD. Continue PPI.  HLD. Continue statin.  Abdominal pain. Will get x-ray abdomen. Patient does have central abdominal tenderness.  CT abdomen pelvis without contrast early in November was negative for any acute abnormality.  Advance Care Planning:   Code Status: DNR confirmed with the patient and the daughter. Consults: Cardiology Family Communication: Daughter at bedside  Author: Berle Mull, MD 02/12/2022 6:05 PM For on call review [email or web address].

## 2022-02-13 ENCOUNTER — Observation Stay (HOSPITAL_COMMUNITY): Payer: Medicare HMO

## 2022-02-13 ENCOUNTER — Inpatient Hospital Stay (HOSPITAL_COMMUNITY): Payer: Medicare HMO

## 2022-02-13 DIAGNOSIS — R609 Edema, unspecified: Secondary | ICD-10-CM | POA: Diagnosis not present

## 2022-02-13 DIAGNOSIS — Z6824 Body mass index (BMI) 24.0-24.9, adult: Secondary | ICD-10-CM | POA: Diagnosis not present

## 2022-02-13 DIAGNOSIS — R627 Adult failure to thrive: Secondary | ICD-10-CM | POA: Diagnosis present

## 2022-02-13 DIAGNOSIS — K219 Gastro-esophageal reflux disease without esophagitis: Secondary | ICD-10-CM | POA: Diagnosis present

## 2022-02-13 DIAGNOSIS — Z885 Allergy status to narcotic agent status: Secondary | ICD-10-CM | POA: Diagnosis not present

## 2022-02-13 DIAGNOSIS — I482 Chronic atrial fibrillation, unspecified: Secondary | ICD-10-CM | POA: Diagnosis not present

## 2022-02-13 DIAGNOSIS — Z880 Allergy status to penicillin: Secondary | ICD-10-CM | POA: Diagnosis not present

## 2022-02-13 DIAGNOSIS — Z1152 Encounter for screening for COVID-19: Secondary | ICD-10-CM | POA: Diagnosis not present

## 2022-02-13 DIAGNOSIS — Z7189 Other specified counseling: Secondary | ICD-10-CM | POA: Diagnosis not present

## 2022-02-13 DIAGNOSIS — G629 Polyneuropathy, unspecified: Secondary | ICD-10-CM | POA: Diagnosis present

## 2022-02-13 DIAGNOSIS — I10 Essential (primary) hypertension: Secondary | ICD-10-CM | POA: Diagnosis present

## 2022-02-13 DIAGNOSIS — E860 Dehydration: Secondary | ICD-10-CM | POA: Diagnosis present

## 2022-02-13 DIAGNOSIS — Z66 Do not resuscitate: Secondary | ICD-10-CM | POA: Diagnosis present

## 2022-02-13 DIAGNOSIS — Z79899 Other long term (current) drug therapy: Secondary | ICD-10-CM | POA: Diagnosis not present

## 2022-02-13 DIAGNOSIS — Z888 Allergy status to other drugs, medicaments and biological substances status: Secondary | ICD-10-CM | POA: Diagnosis not present

## 2022-02-13 DIAGNOSIS — E876 Hypokalemia: Secondary | ICD-10-CM | POA: Diagnosis present

## 2022-02-13 DIAGNOSIS — I959 Hypotension, unspecified: Secondary | ICD-10-CM | POA: Diagnosis present

## 2022-02-13 DIAGNOSIS — Z515 Encounter for palliative care: Secondary | ICD-10-CM | POA: Diagnosis not present

## 2022-02-13 DIAGNOSIS — E785 Hyperlipidemia, unspecified: Secondary | ICD-10-CM | POA: Diagnosis present

## 2022-02-13 DIAGNOSIS — I4891 Unspecified atrial fibrillation: Secondary | ICD-10-CM | POA: Diagnosis present

## 2022-02-13 DIAGNOSIS — I4821 Permanent atrial fibrillation: Secondary | ICD-10-CM | POA: Diagnosis present

## 2022-02-13 DIAGNOSIS — E039 Hypothyroidism, unspecified: Secondary | ICD-10-CM | POA: Diagnosis present

## 2022-02-13 DIAGNOSIS — I952 Hypotension due to drugs: Secondary | ICD-10-CM | POA: Diagnosis not present

## 2022-02-13 DIAGNOSIS — D649 Anemia, unspecified: Secondary | ICD-10-CM | POA: Diagnosis present

## 2022-02-13 DIAGNOSIS — I2510 Atherosclerotic heart disease of native coronary artery without angina pectoris: Secondary | ICD-10-CM | POA: Diagnosis present

## 2022-02-13 DIAGNOSIS — E871 Hypo-osmolality and hyponatremia: Secondary | ICD-10-CM | POA: Diagnosis present

## 2022-02-13 DIAGNOSIS — I495 Sick sinus syndrome: Secondary | ICD-10-CM | POA: Diagnosis present

## 2022-02-13 DIAGNOSIS — J9811 Atelectasis: Secondary | ICD-10-CM | POA: Diagnosis present

## 2022-02-13 LAB — COMPREHENSIVE METABOLIC PANEL
ALT: 15 U/L (ref 0–44)
AST: 28 U/L (ref 15–41)
Albumin: 2.5 g/dL — ABNORMAL LOW (ref 3.5–5.0)
Alkaline Phosphatase: 104 U/L (ref 38–126)
Anion gap: 8 (ref 5–15)
BUN: 22 mg/dL (ref 8–23)
CO2: 26 mmol/L (ref 22–32)
Calcium: 8.8 mg/dL — ABNORMAL LOW (ref 8.9–10.3)
Chloride: 97 mmol/L — ABNORMAL LOW (ref 98–111)
Creatinine, Ser: 0.95 mg/dL (ref 0.44–1.00)
GFR, Estimated: 57 mL/min — ABNORMAL LOW (ref >60)
Glucose, Bld: 117 mg/dL — ABNORMAL HIGH (ref 70–99)
Potassium: 3.4 mmol/L — ABNORMAL LOW (ref 3.5–5.1)
Sodium: 131 mmol/L — ABNORMAL LOW (ref 135–145)
Total Bilirubin: 2.4 mg/dL — ABNORMAL HIGH (ref 0.3–1.2)
Total Protein: 5.5 g/dL — ABNORMAL LOW (ref 6.5–8.1)

## 2022-02-13 LAB — ECHOCARDIOGRAM LIMITED
Area-P 1/2: 5.93 cm2
Height: 63 in
S' Lateral: 1.6 cm
Weight: 2246.93 oz

## 2022-02-13 LAB — LACTIC ACID, PLASMA: Lactic Acid, Venous: 1.3 mmol/L (ref 0.5–1.9)

## 2022-02-13 LAB — CBC
HCT: 33.1 % — ABNORMAL LOW (ref 36.0–46.0)
Hemoglobin: 11 g/dL — ABNORMAL LOW (ref 12.0–15.0)
MCH: 34.1 pg — ABNORMAL HIGH (ref 26.0–34.0)
MCHC: 33.2 g/dL (ref 30.0–36.0)
MCV: 102.5 fL — ABNORMAL HIGH (ref 80.0–100.0)
Platelets: 166 10*3/uL (ref 150–400)
RBC: 3.23 MIL/uL — ABNORMAL LOW (ref 3.87–5.11)
RDW: 13.7 % (ref 11.5–15.5)
WBC: 11.1 10*3/uL — ABNORMAL HIGH (ref 4.0–10.5)
nRBC: 0 % (ref 0.0–0.2)

## 2022-02-13 LAB — TSH: TSH: 3.537 u[IU]/mL (ref 0.350–4.500)

## 2022-02-13 LAB — TROPONIN I (HIGH SENSITIVITY): Troponin I (High Sensitivity): 42 ng/L — ABNORMAL HIGH (ref <18)

## 2022-02-13 LAB — T4, FREE: Free T4: 1.69 ng/dL — ABNORMAL HIGH (ref 0.61–1.12)

## 2022-02-13 MED ORDER — MENTHOL 3 MG MT LOZG
1.0000 | LOZENGE | OROMUCOSAL | Status: DC | PRN
  Filled 2022-02-13: qty 9

## 2022-02-13 MED ORDER — SODIUM CHLORIDE 0.9 % IV SOLN: INTRAVENOUS | Status: DC

## 2022-02-13 MED ORDER — PANTOPRAZOLE SODIUM 40 MG IV SOLR
40.0000 mg | Freq: Two times a day (BID) | INTRAVENOUS | Status: DC
  Administered 2022-02-13 – 2022-02-22 (×18): 40 mg via INTRAVENOUS
  Filled 2022-02-13 (×18): qty 10

## 2022-02-13 MED ORDER — BENZONATATE 100 MG PO CAPS
200.0000 mg | ORAL_CAPSULE | Freq: Three times a day (TID) | ORAL | Status: DC
  Administered 2022-02-13 – 2022-02-26 (×39): 200 mg via ORAL
  Filled 2022-02-13 (×40): qty 2

## 2022-02-13 MED ORDER — DEXTROMETHORPHAN POLISTIREX ER 30 MG/5ML PO SUER
30.0000 mg | Freq: Two times a day (BID) | ORAL | Status: DC
  Administered 2022-02-16 – 2022-02-24 (×3): 30 mg via ORAL
  Filled 2022-02-13 (×27): qty 5

## 2022-02-13 MED ORDER — PROCHLORPERAZINE EDISYLATE 10 MG/2ML IJ SOLN
10.0000 mg | Freq: Four times a day (QID) | INTRAMUSCULAR | Status: DC | PRN
  Administered 2022-02-13: 10 mg via INTRAVENOUS
  Filled 2022-02-13 (×3): qty 2

## 2022-02-13 MED ORDER — ALBUMIN HUMAN 5 % IV SOLN
12.5000 g | Freq: Once | INTRAVENOUS | Status: AC
  Administered 2022-02-13: 12.5 g via INTRAVENOUS
  Filled 2022-02-13: qty 250

## 2022-02-13 MED ORDER — IOHEXOL 350 MG/ML SOLN
50.0000 mL | Freq: Once | INTRAVENOUS | Status: AC | PRN
  Administered 2022-02-13: 50 mL via INTRAVENOUS

## 2022-02-13 NOTE — Progress Notes (Signed)
   02/13/22 0839  Vitals  Temp 98.2 F (36.8 C)  Temp Source Oral  BP 92/67  MAP (mmHg) 72  BP Location Right Arm  BP Method Automatic  Patient Position (if appropriate) Lying  Pulse Rate (!) 102  Pulse Rate Source Monitor  ECG Heart Rate (!) 102  Resp (!) 24  MEWS COLOR  MEWS Score Color Yellow  Oxygen Therapy  SpO2 97 %  O2 Device Room Air  Pain Assessment  Pain Scale 0-10  Pain Score 0  MEWS Score  MEWS Temp 0  MEWS Systolic 1  MEWS Pulse 1  MEWS RR 1  MEWS LOC 0  MEWS Score 3    Charge RN rounded on patient per primary RN request. Patient is currently RED MEWS due to HR and RR. Patient is asymptomatic, lying in bed with daughter at bedside. Patient verbalize 0/10 chest pain and no shortness of breath. Patient is current on cardizem gtt at the maxed dose and rate of 15mg /hr. Paged and notified cardiology [facility]. Spoke to MD on the unit. MD to round on patient.

## 2022-02-13 NOTE — Progress Notes (Signed)
PT Cancellation Note  Patient Details Name: Charlotte Powell MRN: [id number] DOB: 1928-11-13   Cancelled Treatment:    Reason Eval/Treat Not Completed: Medical issues which prohibited therapy.  Pt is having HR up to 159 currently, hold pending control of vitals.   Ivar Drape 02/13/2022, 11:04 AM  Samul Dada, PT PhD Acute Rehab Dept. Number: [facility] [id number] and [facility] [phone]

## 2022-02-13 NOTE — Progress Notes (Addendum)
[facility] Progress Note Patient: Charlotte Powell XAJ:[id number] DOB: April 28, 1928 DOA: 02/12/2022  DOS: the patient was seen and examined on 02/13/2022  Brief hospital course: Charlotte Powell is a 88 y.o. female with PMH significant of chronic A-fib, CAD, GERD, HTN, HLD, hypothyroidism. Present to the hospital with complaints of shortness of breath and nausea. Also had some substernal chest pain. Found to have A-fib with RVR. Cardiology consulted.  Currently on Cardizem drip.  CTPE negative for pulmonary embolism. Assessment and Plan:  Chronic A-fib with RVR. Started on Cardizem infusion in the ER. Will continue with the same for now. Cardiology consulted. Recently had an echocardiogram which showed preserved EF. Repeat echocardiogram also shows preserved EF this admission. Monitor on telemetry.  May require amiodarone drip.  Has been on Eliquis before admission.   Bilateral basal crackles. Left-sided infiltrate. Bilateral lower extremity edema. Recently hospitalized. Procalcitonin is negative. Less likely pneumonia. CT PE protocol negative for pulmonary embolism. Lower extremity Doppler negative for DVT. Lower extremity does not appear to be actively infected for now.  Swelling improving. Incentive spirometry and flutter valve. Aggressive therapy for cough recommended.  Daughter has some symptoms with regards to Hycodan.  Patient unable to tolerate Delsym with nausea.  Intractable nausea. So far no vomiting. CT PE protocol negative for any acute abnormality with the esophagus. If unable to swallow, may require GI evaluation versus x-ray esophagogram.   Chronic anticoagulation. For A-fib on Eliquis. Continue present   Chronic neuropathy. Continue gabapentin nightly.   Hypothyroidism. Holding Synthroid due to Normal TSH and elevated free T4.  In the setting of RVR.   GERD. Continue PPI.   HLD. Continue statin.   Abdominal pain. Unremarkable x-ray  abdomen. Likely from cough.  Monitor.  Hypotension. Dehydration. Hypoalbuminemia. Patient was given IV fluid by cardiology. I gave her some IV albumin.  Monitor.   Subjective: No nausea no vomiting.  No fever no chills at the time of my evaluation.  After taking her cough medicine she started having nausea as well as coughing spell.  Physical Exam: General: in Mild distress, No Rash Cardiovascular: S1 and S2 Present, No Murmur Respiratory: Good respiratory effort, Bilateral Air entry present.  Bilateral crackles, No wheezes Abdomen: Bowel Sound present, No tenderness Extremities: Bilateral improving edema Neuro: Alert and oriented x3, no new focal deficit  Data Reviewed: I have Reviewed nursing notes, Vitals, and Lab results. Since last encounter, pertinent lab results CBC and BMP   . I have ordered test including CBC and BMP  .   Disposition: Status is: Inpatient Remains inpatient appropriate because: Need IV rate control  apixaban (ELIQUIS) tablet 2.5 mg Start: 02/12/22 2200 apixaban (ELIQUIS) tablet 2.5 mg   Family Communication: Daughter at bedside Level of care: Progressive Continue progressive care. Vitals:   02/13/22 1324 02/13/22 1400 02/13/22 1500 02/13/22 1600  BP:  107/74 106/70 109/74  Pulse:  73 (!) 120 (!) 120  Resp: 20 18 20 20   Temp:      TempSrc:      SpO2:  96% 95% 92%  Weight:      Height:         Author: , MD 02/13/2022 7:06 PM  Please look on [email or web address] to find out who is on call.

## 2022-02-13 NOTE — Progress Notes (Signed)
Rounding Note    Patient Name: Charlotte Powell Date of Encounter: 02/13/2022  [facility] Cardiologist: Thomasene Ripple, DO    Subjective    88 yo with hx of permanent afib  Was admitted with elevated WBC, Afib with RVR , dyspnea, nausea, substernal chest pain   She is still tachycardic despite max dose Cardizem drip   Troponins are 49, 47, 42 - not c/w ACS.  More suggestive of demand ischemia   Procalcitonin is < 0.1  Has not eaten in 2-3 days  Is nauseated  Will give zofran,  IV NS    Inpatient Medications    Scheduled Meds:  apixaban  2.5 mg Oral BID   docusate sodium  100 mg Oral BID   fluticasone  1 spray Each Nare Daily   gabapentin  300 mg Oral QHS   guaiFENesin  600 mg Oral BID   melatonin  5 mg Oral QHS   pantoprazole  40 mg Oral Daily   rosuvastatin  20 mg Oral Daily   Continuous Infusions:  diltiazem (CARDIZEM) infusion 15 mg/hr (02/13/22 0815)   PRN Meds: acetaminophen **OR** acetaminophen, levalbuterol, ondansetron **OR** ondansetron (ZOFRAN) IV   Vital Signs    Vitals:   02/12/22 2347 02/13/22 0322 02/13/22 0800 02/13/22 0839  BP: 103/74 121/82 (!) 112/58 92/67  Pulse: (!) 104 (!) 129 88 (!) 102  Resp: 18 20 (!) 21 (!) 24  Temp: 98.3 F (36.8 C) 98.2 F (36.8 C) (!) 97.5 F (36.4 C) 98.2 F (36.8 C)  TempSrc: Oral Oral (P) Oral Oral  SpO2: 95% 98% 98% 97%  Weight:      Height:        Intake/Output Summary (Last 24 hours) at 02/13/2022 3329 Last data filed at 02/13/2022 0300 Gross per 24 hour  Intake 833.28 ml  Output --  Net 833.28 ml      02/12/2022    6:09 PM 02/12/2022   12:23 PM 01/19/2022    6:57 PM  Last 3 Weights  Weight (lbs) 140 lb 6.9 oz 137 lb 135 lb  Weight (kg) 63.7 kg 62.143 kg 61.236 kg      Telemetry     Afib with RVR - Personally Reviewed  ECG     - Personally Reviewed  Physical Exam   GEN: eldelry female,  appears weak,  somewhat frail No acute distress.   Neck: No JVD Cardiac: irreg.  Irregl , tachy Respiratory: rales in bases  GI: Soft, nontender, non-distended  MS: lower legs are erythematous, trace edema ( better than yesterday  Neuro:  Nonfocal  Psych: Normal affect   Labs    High Sensitivity Troponin:   Recent Labs  Lab 01/15/22 2320 02/12/22 1257 02/12/22 1625 02/13/22 0554  TROPONINIHS 6 49* 47* 42*     Chemistry Recent Labs  Lab 02/12/22 1257 02/12/22 1328 02/12/22 1912 02/13/22 0222  NA 132* 131* 131* 131*  K 3.6 3.7 3.7 3.4*  CL 94* 95* 95* 97*  CO2 21*  --  27 26  GLUCOSE 81 81 84 117*  BUN 20 25* 20 22  CREATININE 0.95 0.80 0.93 0.95  CALCIUM 8.8*  --  8.9 8.8*  MG  --   --  1.5*  --   PROT 6.2*  --   --  5.5*  ALBUMIN 2.9*  --   --  2.5*  AST 39  --   --  28  ALT 17  --   --  15  ALKPHOS  125  --   --  104  BILITOT 2.2*  --   --  2.4*  GFRNONAA 57*  --  58* 57*  ANIONGAP 17*  --  9 8    Lipids No results for input(s): "CHOL", "TRIG", "HDL", "LABVLDL", "LDLCALC", "CHOLHDL" in the last 168 hours.  Hematology Recent Labs  Lab 02/12/22 1257 02/12/22 1328 02/13/22 0222  WBC 11.6*  --  11.1*  RBC 3.81*  --  3.23*  HGB 13.4 13.3 11.0*  HCT 41.0 39.0 33.1*  MCV 107.6*  --  102.5*  MCH 35.2*  --  34.1*  MCHC 32.7  --  33.2  RDW 14.2  --  13.7  PLT 166  --  166   Thyroid  Recent Labs  Lab 02/13/22 0222  TSH 3.537  FREET4 1.69*    BNP Recent Labs  Lab 02/12/22 1912  BNP 235.1*    DDimer No results for input(s): "DDIMER" in the last 168 hours.   Radiology    CT Angio Chest Pulmonary Embolism (PE) W or WO Contrast  Result Date: 02/13/2022 CLINICAL DATA:  88 year old female with suspected pulmonary embolism. Shortness of breath. EXAM: CT ANGIOGRAPHY CHEST WITH CONTRAST TECHNIQUE: Multidetector CT imaging of the chest was performed using the standard protocol during bolus administration of intravenous contrast. Multiplanar CT image reconstructions and MIPs were obtained to evaluate the vascular anatomy. RADIATION DOSE  REDUCTION: This exam was performed according to the departmental dose-optimization program which includes automated exposure control, adjustment of the mA and/or kV according to patient size and/or use of iterative reconstruction technique. CONTRAST:  69mL OMNIPAQUE IOHEXOL 350 MG/ML SOLN COMPARISON:  Chest CT 02/09/2021. FINDINGS: Cardiovascular: No filling defects within the pulmonary arterial tree to suggest pulmonary embolism. Heart size is mildly enlarged with biatrial dilatation. There is no significant pericardial fluid, thickening or pericardial calcification. There is aortic atherosclerosis, as well as atherosclerosis of the great vessels of the mediastinum and the coronary arteries, including calcified atherosclerotic plaque in the left main, left anterior descending, left circumflex and right coronary arteries. Mediastinum/Nodes: No pathologically enlarged mediastinal or hilar lymph nodes. Esophagus is unremarkable in appearance. No axillary lymphadenopathy. Lungs/Pleura: Small bilateral pleural effusions lying dependently with areas of dependent passive subsegmental atelectasis in the lower lobes of the lungs bilaterally. No acute consolidative airspace disease. No definite suspicious appearing pulmonary nodules or masses are noted. Upper Abdomen: Aortic atherosclerosis. Musculoskeletal: Chronic appearing compression fractures are noted at T5, T7, T9 and T11, most severe at T7 where there is 60% loss of anterior vertebral body height. Multiple old healed fractures of the left anterolateral third, fourth and fifth ribs. There are no aggressive appearing lytic or blastic lesions noted in the visualized portions of the skeleton. Review of the MIP images confirms the above findings. IMPRESSION: 1. No evidence of pulmonary embolism. 2. Small bilateral pleural effusions lying dependently with areas of passive subsegmental atelectasis in the lower lobes of the lungs bilaterally. 3. Cardiomegaly with biatrial  dilatation. 4. Aortic atherosclerosis, in addition to left main and three-vessel coronary artery disease. Aortic Atherosclerosis (ICD10-I70.0). Electronically Signed   By: Trudie Reed M.D.   On: 02/13/2022 07:05   DG Abd 1 View  Result Date: 02/12/2022 CLINICAL DATA:  Abdominal discomfort for 3-4 days EXAM: ABDOMEN - 1 VIEW COMPARISON:  07/08/2021, 01/15/2022 FINDINGS: Two supine frontal views of the abdomen and pelvis are obtained, excluding portions of the hemidiaphragms by collimation. The bowel gas pattern is unremarkable without obstruction or ileus. No significant fecal  retention. No abdominal masses. 8 mm calcification overlying the right mid abdomen corresponds to chest wall calcification within the costal cartilage on prior CT. No urinary tract calculi. Postsurgical changes left hip. Stable multilevel spondylosis. IMPRESSION: 1. Unremarkable bowel gas pattern. Electronically Signed   By: Sharlet Salina M.D.   On: 02/12/2022 19:35   DG Chest Port 1 View  Result Date: 02/12/2022 CLINICAL DATA:  Chest pain. EXAM: PORTABLE CHEST 1 VIEW COMPARISON:  January 15, 2022. FINDINGS: Stable cardiomegaly. Right lung is clear. Mild left basilar atelectasis or infiltrate is noted with possible effusion. Severe degenerative changes seen involving the left glenohumeral joint. IMPRESSION: Left basilar opacity as described above. Electronically Signed   By: Lupita Raider M.D.   On: 02/12/2022 15:10    Cardiac Studies      Patient Profile     88 y.o. female with chronic Afib,  admitted with Afib with RVR   Assessment & Plan     Atrial fib:   V rate is staying elevated.  She has not had anything to drink in 2 to 3 days.  I suspect that she is volume depleted.  She is having lots of nausea.  Will give her some Zofran IV today.  I will give her IV normal saline at 150 cc an hour for 6 hours.  I encouraged her to eat and drink better today.  If her rate does not come down after IV fluids we will add  beta-blockers or perhaps consider amiodarone for better rate control.  She had an echocardiogram approximately 1 month ago.  She had normal left ventricular systolic function at that time.  Will get a limited echo today to ensure that she still has normal LV function.  Troponin levels are minimally elevated with a flat trend suggestive of demand ischemia.  This is not suggestive of an acute coronary syndrome.   Continue eliquis        For questions or updates, please contact [facility] Please consult [email or web address] for contact info under        Signed, Kristeen Miss, MD  02/13/2022, 8:52 AM

## 2022-02-13 NOTE — Progress Notes (Signed)
Lower extremity venous bilateral study completed.   Please see CV Proc for preliminary results.   Zidan Helget, RDMS, RVT  

## 2022-02-13 NOTE — Hospital Course (Signed)
Charlotte Powell is a 88 y.o. female with PMH significant of chronic A-fib, CAD, GERD, HTN, HLD, hypothyroidism. Present to the hospital with complaints of shortness of breath and nausea. Also had some substernal chest pain. Found to have A-fib with RVR. Cardiology consulted.  Currently on Cardizem drip.  CTPE negative for pulmonary embolism.

## 2022-02-13 NOTE — Progress Notes (Signed)
  Echocardiogram 2D Echocardiogram has been performed.  Charlotte Powell 02/13/2022, 10:10 AM

## 2022-02-13 NOTE — Progress Notes (Signed)
OT Cancellation Note  Patient Details Name: Charlotte Powell MRN: [id number] DOB: 1928-09-04   Cancelled Treatment:    Reason Eval/Treat Not Completed: Medical issues which prohibited therapy (Pt with elevated HR (150s). Will follow.   Evern Bio 02/13/2022, 11:23 AM Berna Spare, OTR/L Acute Rehabilitation Services Office: [phone]

## 2022-02-13 NOTE — Progress Notes (Signed)
ANTICOAGULATION CONSULT NOTE  Pharmacy Consult for apixaban Indication: atrial fibrillation  Allergies  Allergen Reactions   Baclofen Other (See Comments)    hypoactive encephalopathy due to baclofen   Amoxicillin Other (See Comments)    unknown   Hydrocodone Other (See Comments)    Confusion and AMS    Patient Measurements: Height: 5\' 3"  (160 cm) Weight: 63.7 kg (140 lb 6.9 oz) IBW/kg (Calculated) : 52.4  Vital Signs: Temp: 98.2 F (36.8 C) (12/28 0322) Temp Source: Oral (12/28 0322) BP: 121/82 (12/28 0322) Pulse Rate: 129 (12/28 0322)  Labs: Recent Labs    02/12/22 1257 02/12/22 1328 02/12/22 1625 02/12/22 1912 02/13/22 0222  HGB 13.4 13.3  --   --  11.0*  HCT 41.0 39.0  --   --  33.1*  PLT 166  --   --   --  166  CREATININE 0.95 0.80  --  0.93 0.95  TROPONINIHS 49*  --  47*  --   --     Estimated Creatinine Clearance: 34.6 mL/min (by C-G formula based on SCr of 0.95 mg/dL).   Medical History: Past Medical History:  Diagnosis Date   Atrial fibrillation (HCC)    CAD (coronary artery disease)    Diverticulitis    GERD (gastroesophageal reflux disease)    Heart disease    HLD (hyperlipidemia)    HTN (hypertension)    Hypothyroidism    Vitamin D deficiency     Assessment: 88 yoF with hx AFib on apixaban 2.5mg  BID at home admitted with AF RVR. Pharmacy consulted to watch apixaban dosing as pt is borderline for dose adjustment. Age >47 (88 y/o), Cr < 1.5 (~1mg /dl), Wt currently (83.  It appears dose was changed from 5mg  BID in 2022 during which wt was consistently < 60kg. Recent weights have been slightly over 60 kg. Will continue current dose for now given 88 advanced age, but could consider dose adjustment if wt remains >60kg this admission.   Plan:  Apixaban 5mg  BID as above  , PharmD, BCPS, [facility] Clinical Pharmacist [phone] Please check AMION for all [facility] Pharmacy numbers 02/13/2022

## 2022-02-14 DIAGNOSIS — I482 Chronic atrial fibrillation, unspecified: Secondary | ICD-10-CM | POA: Diagnosis not present

## 2022-02-14 LAB — CBC
HCT: 31.7 % — ABNORMAL LOW (ref 36.0–46.0)
Hemoglobin: 10.9 g/dL — ABNORMAL LOW (ref 12.0–15.0)
MCH: 35 pg — ABNORMAL HIGH (ref 26.0–34.0)
MCHC: 34.4 g/dL (ref 30.0–36.0)
MCV: 101.9 fL — ABNORMAL HIGH (ref 80.0–100.0)
Platelets: 166 10*3/uL (ref 150–400)
RBC: 3.11 MIL/uL — ABNORMAL LOW (ref 3.87–5.11)
RDW: 13.8 % (ref 11.5–15.5)
WBC: 10.1 10*3/uL (ref 4.0–10.5)
nRBC: 0.3 % — ABNORMAL HIGH (ref 0.0–0.2)

## 2022-02-14 LAB — BASIC METABOLIC PANEL
Anion gap: 15 (ref 5–15)
BUN: 28 mg/dL — ABNORMAL HIGH (ref 8–23)
CO2: 20 mmol/L — ABNORMAL LOW (ref 22–32)
Calcium: 8.6 mg/dL — ABNORMAL LOW (ref 8.9–10.3)
Chloride: 100 mmol/L (ref 98–111)
Creatinine, Ser: 1.01 mg/dL — ABNORMAL HIGH (ref 0.44–1.00)
GFR, Estimated: 53 mL/min — ABNORMAL LOW (ref >60)
Glucose, Bld: 92 mg/dL (ref 70–99)
Potassium: 4.4 mmol/L (ref 3.5–5.1)
Sodium: 135 mmol/L (ref 135–145)

## 2022-02-14 LAB — LIPASE, BLOOD: Lipase: 89 U/L — ABNORMAL HIGH (ref 11–51)

## 2022-02-14 LAB — MAGNESIUM: Magnesium: 1.7 mg/dL (ref 1.7–2.4)

## 2022-02-14 MED ORDER — DILTIAZEM LOAD VIA INFUSION
10.0000 mg | Freq: Once | INTRAVENOUS | Status: AC
  Administered 2022-02-14: 10 mg via INTRAVENOUS
  Filled 2022-02-14: qty 10

## 2022-02-14 MED ORDER — DILTIAZEM HCL 30 MG PO TABS
30.0000 mg | ORAL_TABLET | Freq: Three times a day (TID) | ORAL | Status: DC
  Administered 2022-02-14: 30 mg via ORAL
  Filled 2022-02-14: qty 1

## 2022-02-14 MED ORDER — DILTIAZEM HCL-DEXTROSE 125-5 MG/125ML-% IV SOLN (PREMIX)
5.0000 mg/h | INTRAVENOUS | Status: DC
  Administered 2022-02-14: 5 mg/h via INTRAVENOUS
  Administered 2022-02-15: 15 mg/h via INTRAVENOUS
  Filled 2022-02-14 (×2): qty 125

## 2022-02-14 MED ORDER — MAGNESIUM SULFATE IN D5W 1-5 GM/100ML-% IV SOLN
1.0000 g | Freq: Once | INTRAVENOUS | Status: AC
  Administered 2022-02-14: 1 g via INTRAVENOUS
  Filled 2022-02-14: qty 100

## 2022-02-14 NOTE — Evaluation (Signed)
Occupational Therapy Evaluation Patient Details Name: Charlotte Powell MRN: [id number] DOB: 30-Mar-1928 Today's Date: 02/14/2022   History of Present Illness Pt is a 88 year old woman who presented to [facility] on 02/12/22 from [facility] with SOB, nausea and chest pain with afib/RVR. Pt with recent admission for rotovirus and reportedly has not regained her strength since. PMH: afib, CAD, GERD, HTN, HLD, Hypothyroidism, neuropathy, compression fractures.   Clinical Impression   Pt typically walks with a walker and prior to her recent stomach virus was walking to the dining room of  her ALF. She is assisted for bathing and dressing by staff. Pt not feeling well, fatigued. With encouragement she demonstrated bed mobility, sit to stand and side stepping with +2 min assist. She requires set up to total assist for ADLs. Pt in afib throughout session. Pt and daughter plan to return to ALF with increase in level of care and are agreeable to [facility] therapy. Will follow acutely.      Recommendations for follow up therapy are one component of a multi-disciplinary discharge planning process, led by the attending physician.  Recommendations may be updated based on patient status, additional functional criteria and insurance authorization.   Follow Up Recommendations  Home health OT (at ALF)     Assistance Recommended at Discharge Frequent or constant Supervision/Assistance  Patient can return home with the following A lot of help with walking and/or transfers;A lot of help with bathing/dressing/bathroom;Assistance with cooking/housework;Assist for transportation;Help with stairs or ramp for entrance    Functional Status Assessment  Patient has had a recent decline in their functional status and/or demonstrates limited ability to make significant improvements in function in a reasonable and predictable amount of time  Equipment Recommendations  None recommended by OT    Recommendations for Other Services        Precautions / Restrictions Precautions Precautions: Fall Precaution Comments: watch HR, pt with chronic afib Restrictions Weight Bearing Restrictions: No      Mobility Bed Mobility Overal bed mobility: Needs Assistance Bed Mobility: Supine to Sit, Sit to Supine     Supine to sit: +2 for physical assistance, Min assist Sit to supine: +2 for physical assistance, Min assist   General bed mobility comments: assist for LEs over EOB and to raise trunk, guided UB and assist for LEs back into bed    Transfers Overall transfer level: Needs assistance Equipment used: Rolling walker (2 wheels), 2 person hand held assist Transfers: Sit to/from Stand Sit to Stand: +2 physical assistance, Min assist           General transfer comment: Assist to rise and steady, pt able to take side steps along EOB, declined up to chair, but agreed to bed in chair position      Balance Overall balance assessment: Needs assistance   Sitting balance-Leahy Scale: Fair     Standing balance support: Bilateral upper extremity supported Standing balance-Leahy Scale: Poor                             ADL either performed or assessed with clinical judgement   ADL Overall ADL's : Needs assistance/impaired Eating/Feeding: Set up;Bed level   Grooming: Wash/dry hands;Bed level;Set up   Upper Body Bathing: Moderate assistance;Sitting   Lower Body Bathing: Maximal assistance;Sit to/from stand;+2 for physical assistance   Upper Body Dressing : Min guard;Sitting   Lower Body Dressing: Maximal assistance;Sitting/lateral leans;+2 for physical assistance   Toilet Transfer: +  2 for physical assistance;Minimal assistance;Rolling walker (2 wheels)           Functional mobility during ADLs: Rolling walker (2 wheels);+2 for physical assistance;Minimal assistance       Vision Ability to See in Adequate Light: 0 Adequate Patient Visual Report: No change from baseline       Perception      Praxis      Pertinent Vitals/Pain Pain Assessment Pain Assessment: Faces Faces Pain Scale: Hurts little more Pain Location: LEs when mashed Pain Descriptors / Indicators: Grimacing, Guarding, Discomfort Pain Intervention(s): Monitored during session, Repositioned     Hand Dominance Right   Extremity/Trunk Assessment Upper Extremity Assessment Upper Extremity Assessment: Generalized weakness (arthritic changes in hands)   Lower Extremity Assessment Lower Extremity Assessment: Defer to PT evaluation   Cervical / Trunk Assessment Cervical / Trunk Assessment: Kyphotic;Other exceptions (h/o compression fxs)   Communication Communication Communication: No difficulties   Cognition Arousal/Alertness: Awake/alert Behavior During Therapy: WFL for tasks assessed/performed Overall Cognitive Status: Within Functional Limits for tasks assessed                                       General Comments       Exercises     Shoulder Instructions      Home Living Family/patient expects to be discharged to:: Assisted living                             Home Equipment: Hand held shower head;Grab bars - tub/shower;Grab bars - toilet;Rolling Walker (2 wheels);BSC/3in1;Shower seat   Additional Comments: Resident at [facility] where staff is able to provide assist for mobility and ADL/iADLs . daughter present to confirm PLOF      Prior Functioning/Environment Prior Level of Function : Needs assist             Mobility Comments: pt walks short distances with RW, gets rolled down to the dining hall ADLs Comments: assist for dressing/bathing, has not been in the shower since last admission in earlier December, pt mostly taking meals in her room        OT Problem List: Decreased strength;Decreased activity tolerance;Impaired balance (sitting and/or standing);Cardiopulmonary status limiting activity;Pain      OT Treatment/Interventions:  Self-care/ADL training;DME and/or AE instruction;Therapeutic activities;Patient/family education;Balance training    OT Goals(Current goals can be found in the care plan section) Acute Rehab OT Goals OT Goal Formulation: With patient Time For Goal Achievement: 02/28/22 Potential to Achieve Goals: Good ADL Goals Pt Will Perform Grooming: with set-up;sitting Pt Will Perform Upper Body Dressing: with set-up;sitting Pt Will Transfer to Toilet: ambulating;bedside commode;with min assist Pt Will Perform Toileting - Clothing Manipulation and hygiene: with min assist;sit to/from stand Additional ADL Goal #1: Pt will perform bed mobility with min assist in preparation for ADLs.  OT Frequency: Min 2X/week    Co-evaluation   Reason for Co-Treatment: For patient/therapist safety;To address functional/ADL transfers PT goals addressed during session: Mobility/safety with mobility;Balance        AM-PAC OT "6 Clicks" Daily Activity     Outcome Measure Help from another person eating meals?: A Little Help from another person taking care of personal grooming?: A Little Help from another person toileting, which includes using toliet, bedpan, or urinal?: A Lot Help from another person bathing (including washing, rinsing, drying)?: A Lot Help from  another person to put on and taking off regular upper body clothing?: A Little Help from another person to put on and taking off regular lower body clothing?: Total 6 Click Score: 14   End of Session Equipment Utilized During Treatment: Rolling walker (2 wheels) Nurse Communication: Mobility status  Activity Tolerance: Patient limited by fatigue Patient left: in bed;with call bell/phone within reach;with family/visitor present  OT Visit Diagnosis: Unsteadiness on feet (R26.81);Other abnormalities of gait and mobility (R26.89);Muscle weakness (generalized) (M62.81);Other (comment) (decreased activity tolerance)                Time: 1410-1438 OT Time  Calculation (min): 28 min Charges:  OT General Charges $OT Visit: 1 Visit OT Evaluation $OT Eval Moderate Complexity: 1 Mod  Berna Spare, OTR/L Acute Rehabilitation Services Office: [phone]   Evern Bio 02/14/2022, 3:52 PM

## 2022-02-14 NOTE — Progress Notes (Signed)
Rounding Note    Patient Name: Charlotte Powell Date of Encounter: 02/14/2022  [facility] Cardiologist: Thomasene Ripple, DO    Subjective    88 yo with hx of permanent afib  Was admitted with elevated WBC, Afib with RVR , dyspnea, nausea, substernal chest pain    Troponins are 49, 47, 42 - not c/w ACS.  More suggestive of demand ischemia   Procalcitonin is < 0.1  HR is better today  BP is well controlled.   She is off diltiazem completely  She was on Dilt 30 mg TID at the nursing facility . BP is on the low side,  HR is well controlled.  Would hold diltiazem today but would restart if her HR increases and if BP increases   Cont eliquis    Inpatient Medications    Scheduled Meds:  apixaban  2.5 mg Oral BID   benzonatate  200 mg Oral TID   dextromethorphan  30 mg Oral BID   docusate sodium  100 mg Oral BID   fluticasone  1 spray Each Nare Daily   gabapentin  300 mg Oral QHS   guaiFENesin  600 mg Oral BID   melatonin  5 mg Oral QHS   pantoprazole (PROTONIX) IV  40 mg Intravenous Q12H   rosuvastatin  20 mg Oral Daily   Continuous Infusions:  diltiazem (CARDIZEM) infusion Stopped (02/14/22 0033)   magnesium sulfate bolus IVPB 1 g (02/14/22 1010)   PRN Meds: acetaminophen **OR** acetaminophen, levalbuterol, menthol-cetylpyridinium, ondansetron **OR** ondansetron (ZOFRAN) IV, prochlorperazine   Vital Signs    Vitals:   02/14/22 0014 02/14/22 0023 02/14/22 0428 02/14/22 0737  BP:   (!) 109/55 104/77  Pulse:  62 65 71  Resp: 19  19 20   Temp: 98 F (36.7 C)  (!) 97.5 F (36.4 C) 97.8 F (36.6 C)  TempSrc: Oral  Oral Oral  SpO2:   93% 96%  Weight:   64.8 kg   Height:        Intake/Output Summary (Last 24 hours) at 02/14/2022 1021 Last data filed at 02/13/2022 2001 Gross per 24 hour  Intake 240 ml  Output 250 ml  Net -10 ml       02/14/2022    4:28 AM 02/12/2022    6:09 PM 02/12/2022   12:23 PM  Last 3 Weights  Weight (lbs) 142 lb 13.7  oz 140 lb 6.9 oz 137 lb  Weight (kg) 64.8 kg 63.7 kg 62.143 kg      Telemetry     Afib with RVR - Personally Reviewed  ECG     - Personally Reviewed  Physical Exam   GEN: eldelry female,  appears weak,  somewhat frail No acute distress.   Neck: No JVD Cardiac: irreg. Irregl , tachy Respiratory: rales in bases  GI: Soft, nontender, non-distended  MS: lower legs are erythematous, trace edema ( better than yesterday  Neuro:  Nonfocal  Psych: Normal affect   Labs    High Sensitivity Troponin:   Recent Labs  Lab 01/15/22 2320 02/12/22 1257 02/12/22 1625 02/13/22 0554  TROPONINIHS 6 49* 47* 42*      Chemistry Recent Labs  Lab 02/12/22 1257 02/12/22 1328 02/12/22 1912 02/13/22 0222 02/14/22 0130 02/14/22 0551  NA 132*   < > 131* 131* 135  --   K 3.6   < > 3.7 3.4* 4.4  --   CL 94*   < > 95* 97* 100  --   CO2 21*  --  27 26 20*  --   GLUCOSE 81   < > 84 117* 92  --   BUN 20   < > 20 22 28*  --   CREATININE 0.95   < > 0.93 0.95 1.01*  --   CALCIUM 8.8*  --  8.9 8.8* 8.6*  --   MG  --   --  1.5*  --   --  1.7  PROT 6.2*  --   --  5.5*  --   --   ALBUMIN 2.9*  --   --  2.5*  --   --   AST 39  --   --  28  --   --   ALT 17  --   --  15  --   --   ALKPHOS 125  --   --  104  --   --   BILITOT 2.2*  --   --  2.4*  --   --   GFRNONAA 57*  --  58* 57* 53*  --   ANIONGAP 17*  --  9 8 15   --    < > = values in this interval not displayed.     Lipids No results for input(s): "CHOL", "TRIG", "HDL", "LABVLDL", "LDLCALC", "CHOLHDL" in the last 168 hours.  Hematology Recent Labs  Lab 02/12/22 1257 02/12/22 1328 02/13/22 0222 02/14/22 0130  WBC 11.6*  --  11.1* 10.1  RBC 3.81*  --  3.23* 3.11*  HGB 13.4 13.3 11.0* 10.9*  HCT 41.0 39.0 33.1* 31.7*  MCV 107.6*  --  102.5* 101.9*  MCH 35.2*  --  34.1* 35.0*  MCHC 32.7  --  33.2 34.4  RDW 14.2  --  13.7 13.8  PLT 166  --  166 166    Thyroid  Recent Labs  Lab 02/13/22 0222  TSH 3.537  FREET4 1.69*      BNP Recent Labs  Lab 02/12/22 1912  BNP 235.1*     DDimer No results for input(s): "DDIMER" in the last 168 hours.   Radiology    DG CHEST PORT 1 VIEW  Result Date: 02/13/2022 CLINICAL DATA:  Shortness of breath, cough EXAM: PORTABLE CHEST 1 VIEW COMPARISON:  02/12/2022.  CT today. FINDINGS: Heart and mediastinal contours within normal limits. Bilateral lower lobe airspace opacities, left greater than right. Possible small effusions. Slight improvement in aeration in the lung bases since prior study. No acute bony abnormality. IMPRESSION: Bibasilar atelectasis or infiltrates, slightly improved since prior study. Small bilateral effusions. Electronically Signed   By: Rolm Baptise M.D.   On: 02/13/2022 19:25   VAS [place] LOWER EXTREMITY VENOUS (DVT)  Result Date: 02/13/2022  Lower Venous DVT Study Patient Name:  Charlotte Powell  Date of Exam:   02/13/2022 Medical Rec #: [id number]        Accession #:    [id number] Date of Birth: September 21, 1928        Patient Gender: F Patient Age:   9 years Exam Location:  [facility] Procedure:      VAS [place] LOWER EXTREMITY VENOUS (DVT) Referring Phys: PRANAV PATEL --------------------------------------------------------------------------------  Indications: Edema.  Comparison Study: No prior studies. Performing Technologist: Darlin Coco RDMS, RVT  Examination Guidelines: A complete evaluation includes B-mode imaging, spectral Doppler, color Doppler, and power Doppler as needed of all accessible portions of each vessel. Bilateral testing is considered an integral part of a complete examination. Limited examinations for reoccurring indications may be performed as noted. The reflux  portion of the exam is performed with the patient in reverse Trendelenburg.  +---------+---------------+---------+-----------+----------+--------------+ RIGHT    CompressibilityPhasicitySpontaneityPropertiesThrombus Aging  +---------+---------------+---------+-----------+----------+--------------+ CFV      Full           Yes      Yes                                 +---------+---------------+---------+-----------+----------+--------------+ SFJ      Full                                                        +---------+---------------+---------+-----------+----------+--------------+ FV Prox  Full                                                        +---------+---------------+---------+-----------+----------+--------------+ FV Mid   Full                                                        +---------+---------------+---------+-----------+----------+--------------+ FV DistalFull                                                        +---------+---------------+---------+-----------+----------+--------------+ PFV      Full                                                        +---------+---------------+---------+-----------+----------+--------------+ POP      Full           Yes      Yes                                 +---------+---------------+---------+-----------+----------+--------------+ PTV      Full                                                        +---------+---------------+---------+-----------+----------+--------------+ PERO     Full                                                        +---------+---------------+---------+-----------+----------+--------------+   +---------+---------------+---------+-----------+----------+--------------+ LEFT     CompressibilityPhasicitySpontaneityPropertiesThrombus Aging +---------+---------------+---------+-----------+----------+--------------+ CFV      Full           Yes  Yes                                 +---------+---------------+---------+-----------+----------+--------------+ SFJ      Full                                                         +---------+---------------+---------+-----------+----------+--------------+ FV Prox  Full                                                        +---------+---------------+---------+-----------+----------+--------------+ FV Mid   Full                                                        +---------+---------------+---------+-----------+----------+--------------+ FV DistalFull                                                        +---------+---------------+---------+-----------+----------+--------------+ PFV      Full                                                        +---------+---------------+---------+-----------+----------+--------------+ POP      Full           Yes      Yes                                 +---------+---------------+---------+-----------+----------+--------------+ PTV      Full                                                        +---------+---------------+---------+-----------+----------+--------------+ PERO     Full                                                        +---------+---------------+---------+-----------+----------+--------------+     Summary: RIGHT: - There is no evidence of deep vein thrombosis in the lower extremity.  - No cystic structure found in the popliteal fossa.  LEFT: - There is no evidence of deep vein thrombosis in the lower extremity.  - No cystic structure found in the popliteal fossa.  *See table(s) above for measurements and observations. Electronically signed by Harold Barban MD on 02/13/2022 at 5:23:32 PM.    Final  ECHOCARDIOGRAM LIMITED  Result Date: 02/13/2022    ECHOCARDIOGRAM LIMITED REPORT   Patient Name:   GRADIE DODY Date of Exam: 02/13/2022 Medical Rec #:  [id number]       Height:       63.0 in Accession #:    [id number]      Weight:       140.4 lb Date of Birth:  November 20, 1928       BSA:          1.664 m Patient Age:    71 years        BP:           100/63 mmHg Patient Gender: F                HR:           139 bpm. Exam Location:  Inpatient Procedure: Limited Echo, Cardiac Doppler and Limited Color Doppler Indications:    Atrial Fibrillation I48.91  History:        Patient has prior history of Echocardiogram examinations, most                 recent 01/17/2022. CAD, Arrythmias:Atrial Fibrillation,                 Signs/Symptoms:Chest Pain; Risk Factors:Hypertension,                 Dyslipidemia and Non-Smoker.  Sonographer:    Greer Pickerel Referring Phys: [phone] Caydan Mctavish J Tiyona Desouza  Sonographer Comments: Image acquisition challenging due to respiratory motion. IMPRESSIONS  1. Left ventricular ejection fraction, by estimation, is 60 to 65%. The left ventricle has normal function. The left ventricle has no regional wall motion abnormalities. Left ventricular diastolic parameters are indeterminate.  2. Right ventricular systolic function is normal. The right ventricular size is mildly enlarged. There is normal pulmonary artery systolic pressure.  3. Left atrial size was moderately dilated.  4. The mitral valve is normal in structure. Mild mitral valve regurgitation. No evidence of mitral stenosis.  5. Tricuspid valve regurgitation is moderate.  6. The aortic valve is normal in structure. Aortic valve regurgitation is not visualized. No aortic stenosis is present.  7. The inferior vena cava is normal in size with greater than 50% respiratory variability, suggesting right atrial pressure of 3 mmHg. FINDINGS  Left Ventricle: Left ventricular ejection fraction, by estimation, is 60 to 65%. The left ventricle has normal function. The left ventricle has no regional wall motion abnormalities. The left ventricular internal cavity size was normal in size. There is  no left ventricular hypertrophy. Left ventricular diastolic parameters are indeterminate. Right Ventricle: The right ventricular size is mildly enlarged. No increase in right ventricular wall thickness. Right ventricular systolic function is normal. There is  normal pulmonary artery systolic pressure. The tricuspid regurgitant velocity is 2.19  m/s, and with an assumed right atrial pressure of 15 mmHg, the estimated right ventricular systolic pressure is [id number] mmHg. Left Atrium: Left atrial size was moderately dilated. Right Atrium: Right atrial size was normal in size. Pericardium: There is no evidence of pericardial effusion. Presence of epicardial fat layer. Mitral Valve: The mitral valve is normal in structure. Mild mitral valve regurgitation. No evidence of mitral valve stenosis. Tricuspid Valve: The tricuspid valve is normal in structure. Tricuspid valve regurgitation is moderate . No evidence of tricuspid stenosis. Aortic Valve: The aortic valve is normal in structure. Aortic valve regurgitation is not visualized. No aortic stenosis is present. Pulmonic Valve: The pulmonic valve  was normal in structure. Pulmonic valve regurgitation is not visualized. No evidence of pulmonic stenosis. Aorta: The aortic root is normal in size and structure. Venous: The inferior vena cava is normal in size with greater than 50% respiratory variability, suggesting right atrial pressure of 3 mmHg. IAS/Shunts: No atrial level shunt detected by color flow Doppler. Additional Comments: Spectral Doppler performed. Color Doppler performed.  LEFT VENTRICLE PLAX 2D LVIDd:         3.10 cm LVIDs:         1.60 cm LV PW:         1.20 cm LV IVS:        0.90 cm  LEFT ATRIUM         Index LA diam:    5.00 cm 3.01 cm/m  MITRAL VALVE               TRICUSPID VALVE MV Area (PHT): 5.93 cm    TR Peak grad:   19.2 mmHg MV Decel Time: 128 msec    TR Vmax:        219.00 cm/s MV E velocity: 93.00 cm/s MV A velocity: 42.00 cm/s MV E/A ratio:  2.21 Kardie Tobb DO Electronically signed by Berniece Salines DO Signature Date/Time: 02/13/2022/1:44:47 PM    Final    CT Angio Chest Pulmonary Embolism (PE) W or WO Contrast  Result Date: 02/13/2022 CLINICAL DATA:  88 year old female with suspected pulmonary embolism.  Shortness of breath. EXAM: CT ANGIOGRAPHY CHEST WITH CONTRAST TECHNIQUE: Multidetector CT imaging of the chest was performed using the standard protocol during bolus administration of intravenous contrast. Multiplanar CT image reconstructions and MIPs were obtained to evaluate the vascular anatomy. RADIATION DOSE REDUCTION: This exam was performed according to the departmental dose-optimization program which includes automated exposure control, adjustment of the mA and/or kV according to patient size and/or use of iterative reconstruction technique. CONTRAST:  80mL OMNIPAQUE IOHEXOL 350 MG/ML SOLN COMPARISON:  Chest CT 02/09/2021. FINDINGS: Cardiovascular: No filling defects within the pulmonary arterial tree to suggest pulmonary embolism. Heart size is mildly enlarged with biatrial dilatation. There is no significant pericardial fluid, thickening or pericardial calcification. There is aortic atherosclerosis, as well as atherosclerosis of the great vessels of the mediastinum and the coronary arteries, including calcified atherosclerotic plaque in the left main, left anterior descending, left circumflex and right coronary arteries. Mediastinum/Nodes: No pathologically enlarged mediastinal or hilar lymph nodes. Esophagus is unremarkable in appearance. No axillary lymphadenopathy. Lungs/Pleura: Small bilateral pleural effusions lying dependently with areas of dependent passive subsegmental atelectasis in the lower lobes of the lungs bilaterally. No acute consolidative airspace disease. No definite suspicious appearing pulmonary nodules or masses are noted. Upper Abdomen: Aortic atherosclerosis. Musculoskeletal: Chronic appearing compression fractures are noted at T5, T7, T9 and T11, most severe at T7 where there is 60% loss of anterior vertebral body height. Multiple old healed fractures of the left anterolateral third, fourth and fifth ribs. There are no aggressive appearing lytic or blastic lesions noted in the  visualized portions of the skeleton. Review of the MIP images confirms the above findings. IMPRESSION: 1. No evidence of pulmonary embolism. 2. Small bilateral pleural effusions lying dependently with areas of passive subsegmental atelectasis in the lower lobes of the lungs bilaterally. 3. Cardiomegaly with biatrial dilatation. 4. Aortic atherosclerosis, in addition to left main and three-vessel coronary artery disease. Aortic Atherosclerosis (ICD10-I70.0). Electronically Signed   By: Vinnie Langton M.D.   On: 02/13/2022 07:05   DG Abd 1 View  Result  Date: 02/12/2022 CLINICAL DATA:  Abdominal discomfort for 3-4 days EXAM: ABDOMEN - 1 VIEW COMPARISON:  07/08/2021, 01/15/2022 FINDINGS: Two supine frontal views of the abdomen and pelvis are obtained, excluding portions of the hemidiaphragms by collimation. The bowel gas pattern is unremarkable without obstruction or ileus. No significant fecal retention. No abdominal masses. 8 mm calcification overlying the right mid abdomen corresponds to chest wall calcification within the costal cartilage on prior CT. No urinary tract calculi. Postsurgical changes left hip. Stable multilevel spondylosis. IMPRESSION: 1. Unremarkable bowel gas pattern. Electronically Signed   By: Randa Ngo M.D.   On: 02/12/2022 19:35   DG Chest Port 1 View  Result Date: 02/12/2022 CLINICAL DATA:  Chest pain. EXAM: PORTABLE CHEST 1 VIEW COMPARISON:  January 15, 2022. FINDINGS: Stable cardiomegaly. Right lung is clear. Mild left basilar atelectasis or infiltrate is noted with possible effusion. Severe degenerative changes seen involving the left glenohumeral joint. IMPRESSION: Left basilar opacity as described above. Electronically Signed   By: Marijo Conception M.D.   On: 02/12/2022 15:10    Cardiac Studies      Patient Profile     88 y.o. female with chronic Afib,  admitted with Afib with RVR   Assessment & Plan     Atrial fib:    her ventricular rate is much better today (  after 1 liter of NS yesterday .  She is off the Dilt drip completely   She is off oral diltiazem completely at this point  She was on Dilt 30 mg TID at the nursing facility . BP is on the low side,  HR is well controlled.  Would hold diltiazem today but would restart if her HR increases and if BP increases    Continue eliquis    Limited echo yesterday revealed an LV EF of 60-65%. RV - mildly enlarged     She is very weak and tired today . She needs to get up and ambulate ,  she was ambulatory at the SNF . Anticipate DC to home ( SNF) when she is stronger   For questions or updates, please contact [facility] Please consult [email or web address] for contact info under        Signed, Mertie Moores, MD  02/14/2022, 10:21 AM

## 2022-02-14 NOTE — Progress Notes (Signed)
PT Cancellation Note  Patient Details Name: Gracious Renken MRN: [id number] DOB: December 17, 1928   Cancelled Treatment:    Reason Eval/Treat Not Completed: Fatigue/lethargy limiting ability to participate - pt resting, pt's daughter requesting PT come back in the afternoon. Will check back as schedule allows.  Marye Round, PT DPT Acute Rehabilitation Services Pager [phone]  Office [phone]    Tyrone Apple E Christain [place] 02/14/2022, 11:07 AM

## 2022-02-14 NOTE — Progress Notes (Addendum)
[facility] Progress Note Patient: Charlotte Powell [id number] DOB: 07-28-1928 DOA: 02/12/2022  DOS: the patient was seen and examined on 02/14/2022  Brief hospital course:  Charlotte Powell is a 88 y.o. female with PMH significant of chronic A-fib, CAD, GERD, HTN, HLD, hypothyroidism. Present to the hospital with complaints of shortness of breath and nausea. Also had some substernal chest pain. Found to have A-fib with RVR. Cardiology consulted.  Currently on Cardizem drip.  CTPE negative for pulmonary embolism.  Assessment and Plan:  Chronic A-fib with RVR. Started on Cardizem infusion in the ER. Recently had an echocardiogram which showed preserved EF. Repeat echocardiogram also shows preserved EF this admission. Monitor on telemetry.  -Urology input greatly appreciated, blood pressure is soft, heart rate is well-controlled, so we will hold on resuming home dose diltiazem 30 mg 3 times daily for now -Continue with Eliquis for anticoagulation   Addendum : Heart rate currently up to 120s, so resume on home dose colchicine 30 mg 3 times daily.   Bilateral basal crackles. Left-sided infiltrate>> bibasilar atelectasis Bilateral lower extremity edema. Recently hospitalized. Procalcitonin is negative. Less likely pneumonia. CT PE protocol negative for pulmonary embolism. Lower extremity Doppler negative for DVT. Lower extremity does not appear to be actively infected for now.  Swelling improving. -She was encouraged again today to use incentive spirometry and flutter valve  Intractable nausea. she was started on IV Protonix twice daily, much improved today   Chronic anticoagulation. For A-fib on Eliquis. Continue present   Chronic neuropathy. Continue gabapentin nightly.   Hypothyroidism. Holding Synthroid due to Normal TSH and elevated free T4.  In the setting of RVR.   GERD. Continue PPI.   HLD. Continue statin.   Abdominal pain. Unremarkable x-ray  abdomen. Likely from cough.  Monitor.  Hypotension. Dehydration. Hypoalbuminemia. Patient was given IV fluid by cardiology. I gave her some IV albumin.  Monitor.   Subjective:   No nausea, no vomiting, dyspnea much improved, she still reports cough and congestion.     Physical Exam:  Awake Alert, Oriented X 3, No new F.N deficits, frail Symmetrical Chest wall movement, diminished air entry at the bases.no wheezing RRR,No Gallops,Rubs or new Murmurs, No Parasternal Heave +ve B.Sounds, Abd Soft, No tenderness, No rebound - guarding or rigidity. No Cyanosis, Clubbing , improving edema  Data Reviewed: I have Reviewed nursing notes, Vitals, and Lab results. Since last encounter, pertinent lab results CBC and BMP   . I have ordered test including CBC and BMP  .   Disposition: Status is: Inpatient Remains inpatient appropriate because: Need IV rate control  apixaban (ELIQUIS) tablet 2.5 mg Start: 02/12/22 2200 apixaban (ELIQUIS) tablet 2.5 mg   Family Communication: Daughter at bedside Level of care: Progressive Continue progressive care. Vitals:   02/14/22 0014 02/14/22 0023 02/14/22 0428 02/14/22 0737  BP:   (!) 109/55 104/77  Pulse:  62 65 71  Resp: 19  19 20   Temp: 98 F (36.7 C)  (!) 97.5 F (36.4 C) 97.8 F (36.6 C)  TempSrc: Oral  Oral Oral  SpO2:   93% 96%  Weight:   64.8 kg   Height:         Author: , MD 02/14/2022 1:57 PM  Please look on [email or web address] to find out who is on call.

## 2022-02-14 NOTE — Progress Notes (Addendum)
BP 122/79   Pulse (!) 130   Temp 98.4 F (36.9 C) (Oral)   Resp 20   Ht 5\' 3"  (1.6 m)   Wt 64.8 kg   SpO2 93%   BMI 25.31 kg/m   HR now sustaining @120s -130s Pt has no complains.  Cardizem drip started. Plan of care ongoing.  Post bolus. BP 97/63   Pulse (!) 127   Temp 98.4 F (36.9 C) (Oral)   Resp 20   Ht 5\' 3"  (1.6 m)   Wt 64.8 kg   SpO2 93%   BMI 25.31 kg/m  Care ongoing.

## 2022-02-14 NOTE — Progress Notes (Signed)
Physical Therapy Evaluation Patient Details Name: Charlotte Powell MRN: [id number] DOB: 10-Mar-1928 Today's Date: 02/14/2022  History of Present Illness  Pt is a 88 year old woman who presented to [facility] on 02/12/22 from [facility] with SOB, nausea and chest pain with afib/RVR. Pt with recent admission for rotovirus and reportedly has not regained her strength since. PMH: afib, CAD, GERD, HTN, HLD, Hypothyroidism, neuropathy, compression fractures.  Clinical Impression   Pt presents with generalized weakness, impaired balance, and impaired activity tolerance. Pt to benefit from acute PT to address deficits. Pt requiring min +2 for transfer into standing x2, and lateral stepping towards HOB only today. Typically pt can walk short distances with RW, has assist at ALF if needed. PT recommending return to ALF with increased support. PT to progress mobility as tolerated, and will continue to follow acutely.          Recommendations for follow up therapy are one component of a multi-disciplinary discharge planning process, led by the attending physician.  Recommendations may be updated based on patient status, additional functional criteria and insurance authorization.  Follow Up Recommendations Home health PT (increased assist at ALF)      Assistance Recommended at Discharge Intermittent Supervision/Assistance  Patient can return home with the following  A little help with walking and/or transfers;A little help with bathing/dressing/bathroom    Equipment Recommendations None recommended by PT  Recommendations for Other Services       Functional Status Assessment Patient has had a recent decline in their functional status and demonstrates the ability to make significant improvements in function in a reasonable and predictable amount of time.     Precautions / Restrictions Precautions Precautions: Fall Precaution Comments: watch HR, pt with chronic afib Restrictions Weight Bearing  Restrictions: No      Mobility  Bed Mobility Overal bed mobility: Needs Assistance Bed Mobility: Supine to Sit, Sit to Supine     Supine to sit: +2 for physical assistance, Min assist Sit to supine: +2 for physical assistance, Min assist   General bed mobility comments: assist for LEs over EOB and to raise trunk, guided UB and assist for LEs back into bed    Transfers Overall transfer level: Needs assistance Equipment used: Rolling walker (2 wheels), 2 person hand held assist Transfers: Sit to/from Stand Sit to Stand: +2 physical assistance, Min assist           General transfer comment: Assist to rise and steady, pt able to take side steps along EOB, declined up to chair, but agreed to bed in chair position. STS X2 from eOB.    Ambulation/Gait                  Stairs            Wheelchair Mobility    Modified Rankin (Stroke Patients Only)       Balance Overall balance assessment: Needs assistance   Sitting balance-Leahy Scale: Fair     Standing balance support: Bilateral upper extremity supported Standing balance-Leahy Scale: Poor                               Pertinent Vitals/Pain Pain Assessment Pain Assessment: Faces Faces Pain Scale: Hurts little more Pain Location: LEs when mashed Pain Descriptors / Indicators: Grimacing, Guarding, Discomfort Pain Intervention(s): Limited activity within patient's tolerance, Monitored during session, Patient requesting pain meds-RN notified    Home Living Family/patient expects to  be discharged to:: Assisted living                 Home Equipment: Hand held shower head;Grab bars - tub/shower;Grab bars - toilet;Rolling Walker (2 wheels);BSC/3in1;Shower seat Additional Comments: Resident at [facility] where staff is able to provide assist for mobility and ADL/iADLs . daughter present to confirm PLOF    Prior Function Prior Level of Function : Needs assist              Mobility Comments: pt walks short distances with RW, gets rolled down to the dining hall ADLs Comments: assist for dressing/bathing, has not been in the shower since last admission in earlier December, pt mostly taking meals in her room     Hand Dominance   Dominant Hand: Right    Extremity/Trunk Assessment   Upper Extremity Assessment Upper Extremity Assessment: Defer to OT evaluation    Lower Extremity Assessment Lower Extremity Assessment: Generalized weakness (LE swelling and rubor, improving per pt report)    Cervical / Trunk Assessment Cervical / Trunk Assessment: Kyphotic;Other exceptions (h/o compression fxs)  Communication   Communication: No difficulties  Cognition Arousal/Alertness: Awake/alert Behavior During Therapy: WFL for tasks assessed/performed Overall Cognitive Status: Within Functional Limits for tasks assessed                                          General Comments General comments (skin integrity, edema, etc.): HR ranging from 90s to 160s in afib rhythm, RN aware and medicated pt prior to PT/OT session    Exercises     Assessment/Plan    PT Assessment Patient needs continued PT services  PT Problem List Decreased strength;Decreased mobility;Decreased activity tolerance;Decreased balance;Decreased knowledge of use of DME;Pain;Decreased safety awareness;Decreased knowledge of precautions;Decreased cognition       PT Treatment Interventions DME instruction;Therapeutic activities;Gait training;Therapeutic exercise;Patient/family education;Balance training;Functional mobility training;Neuromuscular re-education;Stair training    PT Goals (Current goals can be found in the Care Plan section)  Acute Rehab PT Goals Patient Stated Goal: home PT Goal Formulation: With patient Time For Goal Achievement: 02/28/22 Potential to Achieve Goals: Good    Frequency Min 3X/week     Co-evaluation PT/OT/SLP Co-Evaluation/Treatment:  Yes Reason for Co-Treatment: For patient/therapist safety;To address functional/ADL transfers PT goals addressed during session: Mobility/safety with mobility;Balance         AM-PAC PT "6 Clicks" Mobility  Outcome Measure Help needed turning from your back to your side while in a flat bed without using bedrails?: A Little Help needed moving from lying on your back to sitting on the side of a flat bed without using bedrails?: A Little Help needed moving to and from a bed to a chair (including a wheelchair)?: A Little Help needed standing up from a chair using your arms (e.g., wheelchair or bedside chair)?: A Little Help needed to walk in hospital room?: A Lot Help needed climbing 3-5 steps with a railing? : A Lot 6 Click Score: 16    End of Session   Activity Tolerance: Patient tolerated treatment well;Patient limited by fatigue Patient left: in bed;with call bell/phone within reach;with bed alarm set;with family/visitor present (in chair position in bed) Nurse Communication: Mobility status PT Visit Diagnosis: Other abnormalities of gait and mobility (R26.89);Muscle weakness (generalized) (M62.81)    Time: [id number] PT Time Calculation (min) (ACUTE ONLY): 26 min   Charges:   PT Evaluation $PT  Eval Low Complexity: 1 Low         Kasandra Fehr S, PT DPT Acute Rehabilitation Services Pager [phone]  Office [phone]   Louis Matte 02/14/2022, 3:57 PM

## 2022-02-15 DIAGNOSIS — I482 Chronic atrial fibrillation, unspecified: Secondary | ICD-10-CM | POA: Diagnosis not present

## 2022-02-15 DIAGNOSIS — I4891 Unspecified atrial fibrillation: Secondary | ICD-10-CM | POA: Diagnosis not present

## 2022-02-15 LAB — BASIC METABOLIC PANEL
Anion gap: 8 (ref 5–15)
BUN: 26 mg/dL — ABNORMAL HIGH (ref 8–23)
CO2: 25 mmol/L (ref 22–32)
Calcium: 8.2 mg/dL — ABNORMAL LOW (ref 8.9–10.3)
Chloride: 100 mmol/L (ref 98–111)
Creatinine, Ser: 0.84 mg/dL (ref 0.44–1.00)
GFR, Estimated: >60 mL/min (ref >60)
Glucose, Bld: 80 mg/dL (ref 70–99)
Potassium: 3.2 mmol/L — ABNORMAL LOW (ref 3.5–5.1)
Sodium: 133 mmol/L — ABNORMAL LOW (ref 135–145)

## 2022-02-15 LAB — MAGNESIUM: Magnesium: 1.9 mg/dL (ref 1.7–2.4)

## 2022-02-15 LAB — CBC
HCT: 31 % — ABNORMAL LOW (ref 36.0–46.0)
Hemoglobin: 10.7 g/dL — ABNORMAL LOW (ref 12.0–15.0)
MCH: 35.2 pg — ABNORMAL HIGH (ref 26.0–34.0)
MCHC: 34.5 g/dL (ref 30.0–36.0)
MCV: 102 fL — ABNORMAL HIGH (ref 80.0–100.0)
Platelets: 147 10*3/uL — ABNORMAL LOW (ref 150–400)
RBC: 3.04 MIL/uL — ABNORMAL LOW (ref 3.87–5.11)
RDW: 13.9 % (ref 11.5–15.5)
WBC: 7.4 10*3/uL (ref 4.0–10.5)
nRBC: 0 % (ref 0.0–0.2)

## 2022-02-15 MED ORDER — POTASSIUM CHLORIDE CRYS ER 20 MEQ PO TBCR
40.0000 meq | EXTENDED_RELEASE_TABLET | Freq: Four times a day (QID) | ORAL | Status: AC
  Administered 2022-02-15 (×2): 40 meq via ORAL
  Filled 2022-02-15 (×2): qty 2

## 2022-02-15 MED ORDER — DILTIAZEM HCL 30 MG PO TABS
30.0000 mg | ORAL_TABLET | Freq: Three times a day (TID) | ORAL | Status: DC
  Administered 2022-02-15 – 2022-02-16 (×6): 30 mg via ORAL
  Filled 2022-02-15 (×6): qty 1

## 2022-02-15 MED ORDER — BOOST / RESOURCE BREEZE PO LIQD CUSTOM
1.0000 | Freq: Three times a day (TID) | ORAL | Status: DC
  Administered 2022-02-16 – 2022-02-26 (×21): 1 via ORAL

## 2022-02-15 NOTE — Progress Notes (Signed)
Rounding Note    Patient Name: Charlotte Powell Date of Encounter: 02/15/2022  [facility] Cardiologist: Thomasene Ripple, DO   Subjective   No CP or dyspnea  Inpatient Medications    Scheduled Meds:  apixaban  2.5 mg Oral BID   benzonatate  200 mg Oral TID   dextromethorphan  30 mg Oral BID   diltiazem  30 mg Oral TID   docusate sodium  100 mg Oral BID   fluticasone  1 spray Each Nare Daily   gabapentin  300 mg Oral QHS   guaiFENesin  600 mg Oral BID   melatonin  5 mg Oral QHS   pantoprazole (PROTONIX) IV  40 mg Intravenous Q12H   potassium chloride  40 mEq Oral Q6H   rosuvastatin  20 mg Oral Daily   Continuous Infusions:  PRN Meds: acetaminophen **OR** acetaminophen, levalbuterol, menthol-cetylpyridinium, ondansetron **OR** ondansetron (ZOFRAN) IV, prochlorperazine   Vital Signs    Vitals:   02/15/22 0415 02/15/22 0600 02/15/22 0700 02/15/22 1030  BP: 102/70 106/69 111/75 (!) 101/55  Pulse: (!) 141 (!) 136 (!) 130 68  Resp: 19 20 20 20   Temp: 98.2 F (36.8 C)  98.3 F (36.8 C) (!) 97.5 F (36.4 C)  TempSrc: Oral  Oral Oral  SpO2: 93% 92% 92% 93%  Weight: 65.1 kg     Height:        Intake/Output Summary (Last 24 hours) at 02/15/2022 1104 Last data filed at 02/15/2022 0915 Gross per 24 hour  Intake 210 ml  Output 950 ml  Net -740 ml      02/15/2022    4:15 AM 02/14/2022    4:28 AM 02/12/2022    6:09 PM  Last 3 Weights  Weight (lbs) 143 lb 8.3 oz 142 lb 13.7 oz 140 lb 6.9 oz  Weight (kg) 65.1 kg 64.8 kg 63.7 kg      Telemetry    Atrial fibrillation rate controlled - Personally Reviewed  Physical Exam   GEN: No acute distress.   Neck: No JVD Cardiac: irregular Respiratory: Clear to auscultation bilaterally. GI: Soft, nontender, non-distended  MS: No edema Neuro:  Nonfocal  Psych: Normal affect   Labs    High Sensitivity Troponin:   Recent Labs  Lab 02/12/22 1257 02/12/22 1625 02/13/22 0554  TROPONINIHS 49* 47* 42*      Chemistry Recent Labs  Lab 02/12/22 1257 02/12/22 1328 02/12/22 1912 02/13/22 0222 02/14/22 0130 02/14/22 0551 02/15/22 0047  NA 132*   < > 131* 131* 135  --  133*  K 3.6   < > 3.7 3.4* 4.4  --  3.2*  CL 94*   < > 95* 97* 100  --  100  CO2 21*  --  27 26 20*  --  25  GLUCOSE 81   < > 84 117* 92  --  80  BUN 20   < > 20 22 28*  --  26*  CREATININE 0.95   < > 0.93 0.95 1.01*  --  0.84  CALCIUM 8.8*  --  8.9 8.8* 8.6*  --  8.2*  MG  --   --  1.5*  --   --  1.7 1.9  PROT 6.2*  --   --  5.5*  --   --   --   ALBUMIN 2.9*  --   --  2.5*  --   --   --   AST 39  --   --  28  --   --   --  ALT 17  --   --  15  --   --   --   ALKPHOS 125  --   --  104  --   --   --   BILITOT 2.2*  --   --  2.4*  --   --   --   GFRNONAA 57*  --  58* 57* 53*  --  >60  ANIONGAP 17*  --  9 8 15   --  8   < > = values in this interval not displayed.     Hematology Recent Labs  Lab 02/13/22 0222 02/14/22 0130 02/15/22 0047  WBC 11.1* 10.1 7.4  RBC 3.23* 3.11* 3.04*  HGB 11.0* 10.9* 10.7*  HCT 33.1* 31.7* 31.0*  MCV 102.5* 101.9* 102.0*  MCH 34.1* 35.0* 35.2*  MCHC 33.2 34.4 34.5  RDW 13.7 13.8 13.9  PLT 166 166 147*   Thyroid  Recent Labs  Lab 02/13/22 0222  TSH 3.537  FREET4 1.69*    BNP Recent Labs  Lab 02/12/22 1912  BNP 235.1*     Radiology    DG CHEST PORT 1 VIEW  Result Date: 02/13/2022 CLINICAL DATA:  Shortness of breath, cough EXAM: PORTABLE CHEST 1 VIEW COMPARISON:  02/12/2022.  CT today. FINDINGS: Heart and mediastinal contours within normal limits. Bilateral lower lobe airspace opacities, left greater than right. Possible small effusions. Slight improvement in aeration in the lung bases since prior study. No acute bony abnormality. IMPRESSION: Bibasilar atelectasis or infiltrates, slightly improved since prior study. Small bilateral effusions. Electronically Signed   By: Rolm Baptise M.D.   On: 02/13/2022 19:25     Patient Profile     88 y.o. female with past medical  history of coronary artery disease, hypertension, hyperlipidemia, permanent atrial fibrillation admitted with weakness, malaise, decreased p.o. intake, possible pneumonia as well as atrial fibrillation with elevated rate.  Echocardiogram this admission shows ejection fraction 60 to 65%, mild right ventricular enlargement, moderate left atrial enlargement, mild mitral regurgitation, moderate tricuspid regurgitation.  CTA showed no pulmonary embolus and small bilateral pleural effusions.  Assessment & Plan    1 permanent atrial fibrillation-patient's heart rate was elevated but Cardizem had been held.  She takes 30 mg 3 times daily at home.  We will continue with this regimen and follow on telemetry.  Continue apixaban.  2 history of coronary artery disease-continue Crestor.  No aspirin given need for apixaban.  For questions or updates, please contact [facility] Please consult [email or web address] for contact info under        Signed, Kirk Ruths, MD  02/15/2022, 11:04 AM

## 2022-02-15 NOTE — Progress Notes (Signed)
[facility] Progress Note Patient: Charlotte Powell FXT:[id number] DOB: August 18, 1928 DOA: 02/12/2022  DOS: the patient was seen and examined on 02/15/2022  Brief hospital course:  Charlotte Powell is a 88 y.o. female with PMH significant of chronic A-fib, CAD, GERD, HTN, HLD, hypothyroidism. Present to the hospital with complaints of shortness of breath and nausea. Also had some substernal chest pain. Found to have A-fib with RVR. Cardiology consulted.  Currently on Cardizem drip.  CTPE negative for pulmonary embolism.  Assessment and Plan:  Chronic A-fib with RVR. Started on Cardizem infusion in the ER. Recently had an echocardiogram which showed preserved EF. Repeat echocardiogram also shows preserved EF this admission. Monitor on telemetry.  -Required to go back on Cardizem drip overnight, but this morning it did improve, will continue with her home dose Cardizem 30 mg oral 3 times daily -Continue with Eliquis for anticoagulation    Bilateral basal crackles. Left-sided infiltrate>> bibasilar atelectasis Bilateral lower extremity edema. Recently hospitalized. Procalcitonin is negative. Less likely pneumonia. CT PE protocol negative for pulmonary embolism. Lower extremity Doppler negative for DVT. Lower extremity does not appear to be actively infected for now.  Swelling improving. -She was encouraged again today to use incentive spirometry and flutter valve  Intractable nausea. she was started on IV Protonix twice daily, much improved today   Chronic anticoagulation. For A-fib on Eliquis. Continue present   Chronic neuropathy. Continue gabapentin nightly.   Hypothyroidism. Holding Synthroid due to Normal TSH and elevated free T4.  In the setting of RVR.   GERD. Continue PPI.   HLD. Continue statin.   Abdominal pain. Unremarkable x-ray abdomen. Likely from cough.  Monitor.  Hypotension. Dehydration. Hypoalbuminemia. Patient was given IV fluid by  cardiology. I gave her some IV albumin.  Monitor.   Subjective:   Plaints of cough, generalized weakness, fatigue, congestion has improved, she required Cardizem drip overnight    Physical Exam:  Awake Alert, Oriented X 3, No new F.N deficits, Normal affect Symmetrical Chest wall movement, air entry at the bases Irregular,No Gallops,Rubs or new Murmurs, No Parasternal Heave +ve B.Sounds, Abd Soft, No tenderness, No rebound - guarding or rigidity. No Cyanosis, Clubbing, improving edema    Data Reviewed: I have Reviewed nursing notes, Vitals, and Lab results. Since last encounter, pertinent lab results CBC and BMP   . I have ordered test including CBC and BMP  .   Disposition: Status is: Inpatient Remains inpatient appropriate because: Need IV rate control  apixaban (ELIQUIS) tablet 2.5 mg Start: 02/12/22 2200 apixaban (ELIQUIS) tablet 2.5 mg   Family Communication: Daughter at bedside Level of care: Progressive Continue progressive care. Vitals:   02/15/22 0600 02/15/22 0700 02/15/22 1030 02/15/22 1100  BP: 106/69 111/75 (!) 101/55 125/62  Pulse: (!) 136 (!) 130 68 (!) 58  Resp: 20 20 20 20   Temp:  98.3 F (36.8 C) (!) 97.5 F (36.4 C)   TempSrc:  Oral Oral   SpO2: 92% 92% 93% 97%  Weight:      Height:         Author: , MD 02/15/2022 1:18 PM  Please look on [email or web address] to find out who is on call.

## 2022-02-16 DIAGNOSIS — I482 Chronic atrial fibrillation, unspecified: Secondary | ICD-10-CM | POA: Diagnosis not present

## 2022-02-16 LAB — BASIC METABOLIC PANEL
Anion gap: 7 (ref 5–15)
BUN: 23 mg/dL (ref 8–23)
CO2: 23 mmol/L (ref 22–32)
Calcium: 8.1 mg/dL — ABNORMAL LOW (ref 8.9–10.3)
Chloride: 100 mmol/L (ref 98–111)
Creatinine, Ser: 0.9 mg/dL (ref 0.44–1.00)
GFR, Estimated: >60 mL/min (ref >60)
Glucose, Bld: 85 mg/dL (ref 70–99)
Potassium: 4.1 mmol/L (ref 3.5–5.1)
Sodium: 130 mmol/L — ABNORMAL LOW (ref 135–145)

## 2022-02-16 LAB — CBC
HCT: 30.2 % — ABNORMAL LOW (ref 36.0–46.0)
Hemoglobin: 10.5 g/dL — ABNORMAL LOW (ref 12.0–15.0)
MCH: 35.8 pg — ABNORMAL HIGH (ref 26.0–34.0)
MCHC: 34.8 g/dL (ref 30.0–36.0)
MCV: 103.1 fL — ABNORMAL HIGH (ref 80.0–100.0)
Platelets: 147 10*3/uL — ABNORMAL LOW (ref 150–400)
RBC: 2.93 MIL/uL — ABNORMAL LOW (ref 3.87–5.11)
RDW: 14 % (ref 11.5–15.5)
WBC: 7.7 10*3/uL (ref 4.0–10.5)
nRBC: 0 % (ref 0.0–0.2)

## 2022-02-16 LAB — MAGNESIUM: Magnesium: 1.7 mg/dL (ref 1.7–2.4)

## 2022-02-16 MED ORDER — ENSURE ENLIVE PO LIQD
237.0000 mL | Freq: Two times a day (BID) | ORAL | Status: DC
  Administered 2022-02-20: 237 mL via ORAL

## 2022-02-16 MED ORDER — MAGNESIUM OXIDE -MG SUPPLEMENT 400 (240 MG) MG PO TABS
800.0000 mg | ORAL_TABLET | Freq: Once | ORAL | Status: AC
  Administered 2022-02-16: 800 mg via ORAL
  Filled 2022-02-16: qty 2

## 2022-02-16 NOTE — Plan of Care (Signed)
  Problem: Pain Managment: Goal: General experience of comfort will improve Outcome: Completed/Met

## 2022-02-16 NOTE — Progress Notes (Signed)
Rounding Note    Patient Name: Charlotte Powell Date of Encounter: 02/16/2022  [facility] Cardiologist: Thomasene Ripple, DO   Subjective   Pt denies CP or dyspnea  Inpatient Medications    Scheduled Meds:  apixaban  2.5 mg Oral BID   benzonatate  200 mg Oral TID   dextromethorphan  30 mg Oral BID   diltiazem  30 mg Oral TID   docusate sodium  100 mg Oral BID   feeding supplement  1 Container Oral TID BM   feeding supplement  237 mL Oral BID BM   fluticasone  1 spray Each Nare Daily   gabapentin  300 mg Oral QHS   guaiFENesin  600 mg Oral BID   melatonin  5 mg Oral QHS   pantoprazole (PROTONIX) IV  40 mg Intravenous Q12H   rosuvastatin  20 mg Oral Daily   Continuous Infusions:  PRN Meds: acetaminophen **OR** acetaminophen, levalbuterol, menthol-cetylpyridinium, ondansetron **OR** ondansetron (ZOFRAN) IV, prochlorperazine   Vital Signs    Vitals:   02/16/22 0432 02/16/22 0630 02/16/22 0738 02/16/22 0740  BP: (!) 121/105 119/82  109/60  Pulse: 79 (!) 109 73 67  Resp: 19   (!) 23  Temp: 98.3 F (36.8 C)   (!) 97.4 F (36.3 C)  TempSrc: Oral   Oral  SpO2: 94%   99%  Weight:      Height:        Intake/Output Summary (Last 24 hours) at 02/16/2022 0945 Last data filed at 02/16/2022 0600 Gross per 24 hour  Intake 380 ml  Output 325 ml  Net 55 ml       02/16/2022   12:18 AM 02/15/2022    4:15 AM 02/14/2022    4:28 AM  Last 3 Weights  Weight (lbs) 143 lb 4.8 oz 143 lb 8.3 oz 142 lb 13.7 oz  Weight (kg) 65 kg 65.1 kg 64.8 kg      Telemetry    Atrial fibrillation rate controlled - Personally Reviewed  Physical Exam   GEN: NAD  Neck: Supple Cardiac: irregular, no gallop Respiratory: CTA GI: Soft, NT/ND MS: No edema Neuro:  Grossly intact Psych: Normal affect   Labs    High Sensitivity Troponin:   Recent Labs  Lab 02/12/22 1257 02/12/22 1625 02/13/22 0554  TROPONINIHS 49* 47* 42*      Chemistry Recent Labs  Lab 02/12/22 1257  02/12/22 1328 02/13/22 0222 02/14/22 0130 02/14/22 0551 02/15/22 0047 02/16/22 0039  NA 132*   < > 131* 135  --  133* 130*  K 3.6   < > 3.4* 4.4  --  3.2* 4.1  CL 94*   < > 97* 100  --  100 100  CO2 21*   < > 26 20*  --  25 23  GLUCOSE 81   < > 117* 92  --  80 85  BUN 20   < > 22 28*  --  26* 23  CREATININE 0.95   < > 0.95 1.01*  --  0.84 0.90  CALCIUM 8.8*   < > 8.8* 8.6*  --  8.2* 8.1*  MG  --    < >  --   --  1.7 1.9 1.7  PROT 6.2*  --  5.5*  --   --   --   --   ALBUMIN 2.9*  --  2.5*  --   --   --   --   AST 39  --  28  --   --   --   --  ALT 17  --  15  --   --   --   --   ALKPHOS 125  --  104  --   --   --   --   BILITOT 2.2*  --  2.4*  --   --   --   --   GFRNONAA 57*   < > 57* 53*  --  >60 >60  ANIONGAP 17*   < > 8 15  --  8 7   < > = values in this interval not displayed.      Hematology Recent Labs  Lab 02/14/22 0130 02/15/22 0047 02/16/22 0039  WBC 10.1 7.4 7.7  RBC 3.11* 3.04* 2.93*  HGB 10.9* 10.7* 10.5*  HCT 31.7* 31.0* 30.2*  MCV 101.9* 102.0* 103.1*  MCH 35.0* 35.2* 35.8*  MCHC 34.4 34.5 34.8  RDW 13.8 13.9 14.0  PLT 166 147* 147*    Thyroid  Recent Labs  Lab 02/13/22 0222  TSH 3.537  FREET4 1.69*     BNP Recent Labs  Lab 02/12/22 1912  BNP 235.1*      Patient Profile     88 y.o. female with past medical history of coronary artery disease, hypertension, hyperlipidemia, permanent atrial fibrillation admitted with weakness, malaise, decreased p.o. intake, possible pneumonia as well as atrial fibrillation with elevated rate.  Echocardiogram this admission shows ejection fraction 60 to 65%, mild right ventricular enlargement, moderate left atrial enlargement, mild mitral regurgitation, moderate tricuspid regurgitation.  CTA showed no pulmonary embolus and small bilateral pleural effusions.  Assessment & Plan    1 permanent atrial fibrillation-heart rate is reasonably well-controlled.  Will continue Cardizem 30 mg 3 times daily  (patient's home dose).  Continue apixaban.    2 history of coronary artery disease-continue Crestor.  No aspirin given need for apixaban.  For questions or updates, please contact [facility] Please consult [email or web address] for contact info under        Signed, Olga Millers, MD  02/16/2022, 9:45 AM

## 2022-02-16 NOTE — Progress Notes (Signed)
[facility] Progress Note Patient: Charlotte Powell [id number]:[id number] DOB: [birth year] DOA: 02/12/2022  DOS: the patient was seen and examined on 02/16/2022  Brief hospital course:  Charlotte Powell is a 88 y.o. female with PMH significant of chronic A-fib, CAD, GERD, HTN, HLD, hypothyroidism. Present to the hospital with complaints of shortness of breath and nausea. Also had some substernal chest pain. Found to have A-fib with RVR. Cardiology consulted.  Currently on Cardizem drip.  CTPE negative for pulmonary embolism.  Assessment and Plan:  Chronic A-fib with RVR. Started on Cardizem infusion in the ER. Recently had an echocardiogram which showed preserved EF. Repeat echocardiogram also shows preserved EF this admission. Monitor on telemetry.  -Require any IV Cardizem over last 24 hours, overall heart rate has been controlled on Cardizem 30 mg oral 3 times daily -Continue with Eliquis for anticoagulation    Bilateral basal crackles. Left-sided infiltrate>> bibasilar atelectasis Bilateral lower extremity edema. Recently hospitalized. Procalcitonin is negative. Less likely pneumonia. CT PE protocol negative for pulmonary embolism. Lower extremity Doppler negative for DVT. Lower extremity does not appear to be actively infected for now.  Swelling improving. -She was encouraged again today to use incentive spirometry and flutter valve  Intractable nausea. she was started on IV Protonix twice daily, much improved today   Chronic anticoagulation. For A-fib on Eliquis. Continue present   Chronic neuropathy. Continue gabapentin nightly.   Hypothyroidism. Holding Synthroid due to Normal TSH and elevated free T4.  In the setting of RVR.   GERD. Continue PPI.   HLD. Continue statin.   Abdominal pain. Unremarkable x-ray abdomen. Likely from cough.  Monitor.  Hypotension. Dehydration. Hypoalbuminemia. Patient was given IV fluid by cardiology. I gave her some IV  albumin.  Monitor.   Subjective:   Reports cough and dyspnea has improved, still reports generalized weakness and fatigue, denies any chest pain .   Physical Exam:   Awake Alert, Oriented X 3, extremely frail Creased air entry at the bases Irregular,No Gallops,Rubs or new Murmurs, No Parasternal Heave +ve B.Sounds, Abd Soft, No tenderness, No rebound - guarding or rigidity. No Cyanosis, Clubbing, trace  edema, No new Rash or bruise     Data Reviewed: I have Reviewed nursing notes, Vitals, and Lab results. Since last encounter, pertinent lab results CBC and BMP   . I have ordered test including CBC and BMP  .   Disposition: Status is: Inpatient Remains inpatient appropriate because: Need IV rate control  apixaban (ELIQUIS) tablet 2.5 mg Start: 02/12/22 2200 apixaban (ELIQUIS) tablet 2.5 mg   Family Communication: Daughter at bedside Level of care: Progressive Continue progressive care. Vitals:   02/16/22 0432 02/16/22 0630 02/16/22 0738 02/16/22 0740  BP: (!) 121/105 119/82  109/60  Pulse: 79 (!) 109 73 67  Resp: 19   (!) 23  Temp: 98.3 F (36.8 C)   (!) 97.4 F (36.3 C)  TempSrc: Oral   Oral  SpO2: 94%   99%  Weight:      Height:         Author: Huey Bienenstock, MD 02/16/2022 1:05 PM  Please look on [email or web address] to find out who is on call.

## 2022-02-17 DIAGNOSIS — I2510 Atherosclerotic heart disease of native coronary artery without angina pectoris: Secondary | ICD-10-CM | POA: Diagnosis not present

## 2022-02-17 DIAGNOSIS — E871 Hypo-osmolality and hyponatremia: Secondary | ICD-10-CM | POA: Diagnosis not present

## 2022-02-17 DIAGNOSIS — I482 Chronic atrial fibrillation, unspecified: Secondary | ICD-10-CM | POA: Diagnosis not present

## 2022-02-17 LAB — CULTURE, BLOOD (ROUTINE X 2)
Culture: NO GROWTH
Culture: NO GROWTH

## 2022-02-17 MED ORDER — DILTIAZEM HCL 30 MG PO TABS
30.0000 mg | ORAL_TABLET | Freq: Three times a day (TID) | ORAL | Status: DC
  Administered 2022-02-17: 30 mg via ORAL
  Filled 2022-02-17: qty 1

## 2022-02-17 MED ORDER — SODIUM CHLORIDE 1 G PO TABS
2.0000 g | ORAL_TABLET | Freq: Two times a day (BID) | ORAL | Status: AC
  Administered 2022-02-17 (×2): 2 g via ORAL
  Filled 2022-02-17 (×2): qty 2

## 2022-02-17 MED ORDER — MIRTAZAPINE 15 MG PO TABS
7.5000 mg | ORAL_TABLET | Freq: Every day | ORAL | Status: DC
  Administered 2022-02-17: 7.5 mg via ORAL
  Filled 2022-02-17: qty 1

## 2022-02-17 MED ORDER — DILTIAZEM HCL ER COATED BEADS 120 MG PO CP24
120.0000 mg | ORAL_CAPSULE | Freq: Every day | ORAL | Status: DC
  Administered 2022-02-17 – 2022-02-19 (×3): 120 mg via ORAL
  Filled 2022-02-17 (×3): qty 1

## 2022-02-17 NOTE — Progress Notes (Signed)
Progress Note  Patient Name: Charlotte Powell Date of Encounter: 02/17/2022  Primary Cardiologist: Godfrey Pick Tobb, DO  Subjective   No palpitations.  Intermittent cough.  Limited appetite.  Inpatient Medications    Scheduled Meds:  apixaban  2.5 mg Oral BID   benzonatate  200 mg Oral TID   dextromethorphan  30 mg Oral BID   diltiazem  30 mg Oral Q8H   docusate sodium  100 mg Oral BID   feeding supplement  1 Container Oral TID BM   feeding supplement  237 mL Oral BID BM   fluticasone  1 spray Each Nare Daily   gabapentin  300 mg Oral QHS   guaiFENesin  600 mg Oral BID   melatonin  5 mg Oral QHS   pantoprazole (PROTONIX) IV  40 mg Intravenous Q12H   rosuvastatin  20 mg Oral Daily   sodium chloride  2 g Oral BID WC    PRN Meds: acetaminophen **OR** acetaminophen, levalbuterol, menthol-cetylpyridinium, ondansetron **OR** ondansetron (ZOFRAN) IV, prochlorperazine   Vital Signs    Vitals:   02/16/22 1534 02/17/22 0027 02/17/22 0413 02/17/22 0740  BP: 114/66 (!) 115/58 105/74 102/72  Pulse: 80 70 (!) 124 (!) 113  Resp: (!) 23 20 16 20   Temp: 98.3 F (36.8 C)   (!) 97.4 F (36.3 C)  TempSrc: Oral   Oral  SpO2: 95% 95% 94% 97%  Weight:  65.9 kg    Height:        Intake/Output Summary (Last 24 hours) at 02/17/2022 0907 Last data filed at 02/17/2022 0400 Gross per 24 hour  Intake 360 ml  Output 651 ml  Net -291 ml   Filed Weights   02/15/22 0415 02/16/22 0018 02/17/22 0027  Weight: 65.1 kg 65 kg 65.9 kg    Telemetry    Atrial fibrillation with heart rate in the 130s at rest.  Personally reviewed.  ECG    An ECG dated 02/12/2022 was personally reviewed today and demonstrated:  Atrial fibrillation with RVR.  Physical Exam   GEN: Elderly woman.  No acute distress.   Neck: No JVD. Cardiac: Irregularly irregular without gallop.  Respiratory: Nonlabored.  Upper airway congestion. GI: Soft, nontender, bowel sounds present. MS: No edema; No deformity. Neuro:   Nonfocal. Psych: Alert and oriented x 3. Normal affect.  Labs    Chemistry Recent Labs  Lab 02/12/22 1257 02/12/22 1328 02/13/22 0222 02/14/22 0130 02/15/22 0047 02/16/22 0039  NA 132*   < > 131* 135 133* 130*  K 3.6   < > 3.4* 4.4 3.2* 4.1  CL 94*   < > 97* 100 100 100  CO2 21*   < > 26 20* 25 23  GLUCOSE 81   < > 117* 92 80 85  BUN 20   < > 22 28* 26* 23  CREATININE 0.95   < > 0.95 1.01* 0.84 0.90  CALCIUM 8.8*   < > 8.8* 8.6* 8.2* 8.1*  PROT 6.2*  --  5.5*  --   --   --   ALBUMIN 2.9*  --  2.5*  --   --   --   AST 39  --  28  --   --   --   ALT 17  --  15  --   --   --   ALKPHOS 125  --  104  --   --   --   BILITOT 2.2*  --  2.4*  --   --   --  GFRNONAA 57*   < > 57* 53* >60 >60  ANIONGAP 17*   < > 8 15 8 7    < > = values in this interval not displayed.     Hematology Recent Labs  Lab 02/14/22 0130 02/15/22 0047 02/16/22 0039  WBC 10.1 7.4 7.7  RBC 3.11* 3.04* 2.93*  HGB 10.9* 10.7* 10.5*  HCT 31.7* 31.0* 30.2*  MCV 101.9* 102.0* 103.1*  MCH 35.0* 35.2* 35.8*  MCHC 34.4 34.5 34.8  RDW 13.8 13.9 14.0  PLT 166 147* 147*    Cardiac Enzymes Recent Labs  Lab 02/12/22 1257 02/12/22 1625 02/13/22 0554  TROPONINIHS 49* 47* 42*    BNP Recent Labs  Lab 02/12/22 1912  BNP 235.1*     Radiology    No results found.  Cardiac Studies   Echocardiogram 02/13/2022: 1. Left ventricular ejection fraction, by estimation, is 60 to 65%. The  left ventricle has normal function. The left ventricle has no regional  wall motion abnormalities. Left ventricular diastolic parameters are  indeterminate.   2. Right ventricular systolic function is normal. The right ventricular  size is mildly enlarged. There is normal pulmonary artery systolic  pressure.   3. Left atrial size was moderately dilated.   4. The mitral valve is normal in structure. Mild mitral valve  regurgitation. No evidence of mitral stenosis.   5. Tricuspid valve regurgitation is moderate.   6.  The aortic valve is normal in structure. Aortic valve regurgitation is  not visualized. No aortic stenosis is present.   7. The inferior vena cava is normal in size with greater than 50%  respiratory variability, suggesting right atrial pressure of 3 mmHg.   Assessment & Plan    1.  Permanent atrial fibrillation presenting with RVR.  CHA2DS2-VASc score is 5.  Currently on Eliquis and Cardizem 30 mg p.o. every 8 hours.  Heart rate control is not adequate as yet.  Left atrium moderately dilated.  2.  Possible URI, not definitively pneumonia by workup however per primary team, no evidence of pulmonary embolus by chest CTA.  3.  Recurrent nausea with limited appetite, somewhat better on IV Protonix.  4.  CAD with history of DES to the RCA in 2004, no active angina.  High-sensitivity troponin I levels minimally elevated in the 40s and and flat pattern not consistent with ACS.  LVEF 60 to 65% by echocardiogram.  Chart reviewed and situation discussed with the patient and daughter at bedside.  Plan to discontinue short acting Cardizem and switch to Cardizem CD 120 mg daily (give dose today) with up titration as tolerated.  Blood pressure may limit this somewhat.  May even need combination of AV nodal blockers at low-dose for more effective heart rate control.  Continue Eliquis.  Signed, Rozann Lesches, MD  02/17/2022, 9:07 AM

## 2022-02-17 NOTE — Progress Notes (Signed)
[facility] Progress Note Patient: Charlotte Powell SEG:[id number] DOB: 12/15/1928 DOA: 02/12/2022  DOS: the patient was seen and examined on 02/17/2022  Brief hospital course:  Charlotte Powell is a 89 y.o. female with PMH significant of chronic A-fib, CAD, GERD, HTN, HLD, hypothyroidism. Present to the hospital with complaints of shortness of breath and nausea. Also had some substernal chest pain. Found to have A-fib with RVR. Cardiology consulted.  Currently on Cardizem drip.  CTPE negative for pulmonary embolism.  Assessment and Plan:  Chronic A-fib with RVR. Started on Cardizem infusion in the ER. Recently had an echocardiogram which showed preserved EF. Repeat echocardiogram also shows preserved EF this admission. Monitor on telemetry.  -Heart rate remains difficult to control where she has been requiring Cardizem drip intermittently, medication has been adjusted by cardiology today, with her short acting Cardizem has been switched to Cardizem CD 120 mg oral daily. -Continue with Eliquis for anticoagulation    Bilateral basal crackles. Left-sided infiltrate>> bibasilar atelectasis Bilateral lower extremity edema. Recently hospitalized. Procalcitonin is negative. Less likely pneumonia. CT PE protocol negative for pulmonary embolism. Lower extremity Doppler negative for DVT. Lower extremity does not appear to be actively infected for now.  Swelling improving. -She was encouraged again today to use incentive spirometry and flutter valve  Intractable nausea. she was started on IV Protonix twice daily, much improved today   Chronic anticoagulation. For A-fib on Eliquis. Continue present   Chronic neuropathy. Continue gabapentin nightly.   Hypothyroidism. Holding Synthroid due to Normal TSH and elevated free T4.  In the setting of RVR.   GERD. Continue PPI.   HLD. Continue statin.   Abdominal pain. Unremarkable x-ray abdomen. Likely from cough.   Monitor.  Hypotension. Dehydration. Hypoalbuminemia. Patient was given IV fluid by cardiology. I gave her some IV albumin.  Monitor.  Hyponatremia -Mild, likely due to poor oral intake and low solute, she was encouraged to drink her boost and supplements, she will be started on iron as well, will start on sodium tablets   Subjective:   Reports cough and dyspnea has improved, still reports generalized weakness and fatigue, denies any chest pain .   Physical Exam:   Awake Alert, Oriented X 3, extremely frail Creased air entry at the bases Irregular,No Gallops,Rubs or new Murmurs, No Parasternal Heave +ve B.Sounds, Abd Soft, No tenderness, No rebound - guarding or rigidity. No Cyanosis, Clubbing, trace  edema, No new Rash or bruise     Data Reviewed: I have Reviewed nursing notes, Vitals, and Lab results. Since last encounter, pertinent lab results CBC and BMP   . I have ordered test including CBC and BMP  .   Disposition: Status is: Inpatient Remains inpatient appropriate because: Need IV rate control  apixaban (ELIQUIS) tablet 2.5 mg Start: 02/12/22 2200 apixaban (ELIQUIS) tablet 2.5 mg   Family Communication: Daughter at bedside Level of care: Progressive Continue progressive care. Vitals:   02/17/22 0740 02/17/22 0956 02/17/22 1112 02/17/22 1310  BP: 102/72 93/65 103/74   Pulse: (!) 113 (!) 135 (!) 104 75  Resp: 20 20 20 19   Temp: (!) 97.4 F (36.3 C)  (!) 97.1 F (36.2 C)   TempSrc: Oral  Oral   SpO2: 97%  98% 97%  Weight:      Height:         Author: Phillips Climes, MD 02/17/2022 1:46 PM  Please look on [email or web address] to find out who is on call.

## 2022-02-18 ENCOUNTER — Inpatient Hospital Stay (HOSPITAL_COMMUNITY): Payer: Medicare HMO

## 2022-02-18 DIAGNOSIS — I482 Chronic atrial fibrillation, unspecified: Secondary | ICD-10-CM | POA: Diagnosis not present

## 2022-02-18 LAB — CBC
HCT: 22.9 % — ABNORMAL LOW (ref 36.0–46.0)
HCT: 36 % (ref 36.0–46.0)
Hemoglobin: 8.1 g/dL — ABNORMAL LOW (ref 12.0–15.0)
Hemoglobin: 12.3 g/dL (ref 12.0–15.0)
MCH: 36 pg — ABNORMAL HIGH (ref 26.0–34.0)
MCH: 35.3 pg — ABNORMAL HIGH (ref 26.0–34.0)
MCHC: 35.4 g/dL (ref 30.0–36.0)
MCHC: 34.2 g/dL (ref 30.0–36.0)
MCV: 101.8 fL — ABNORMAL HIGH (ref 80.0–100.0)
MCV: 103.4 fL — ABNORMAL HIGH (ref 80.0–100.0)
Platelets: 120 10*3/uL — ABNORMAL LOW (ref 150–400)
Platelets: 142 10*3/uL — ABNORMAL LOW (ref 150–400)
RBC: 2.25 MIL/uL — ABNORMAL LOW (ref 3.87–5.11)
RBC: 3.48 MIL/uL — ABNORMAL LOW (ref 3.87–5.11)
RDW: 14 % (ref 11.5–15.5)
RDW: 14.1 % (ref 11.5–15.5)
WBC: 6.3 10*3/uL (ref 4.0–10.5)
WBC: 6.3 10*3/uL (ref 4.0–10.5)
nRBC: 0 % (ref 0.0–0.2)
nRBC: 0 % (ref 0.0–0.2)

## 2022-02-18 LAB — ABO/RH: ABO/RH(D): O NEG

## 2022-02-18 MED ORDER — METOPROLOL TARTRATE 25 MG PO TABS
25.0000 mg | ORAL_TABLET | Freq: Two times a day (BID) | ORAL | Status: DC
  Administered 2022-02-18 – 2022-02-19 (×3): 25 mg via ORAL
  Filled 2022-02-18 (×3): qty 1

## 2022-02-18 MED ORDER — POLYETHYLENE GLYCOL 3350 17 G PO PACK
17.0000 g | PACK | Freq: Two times a day (BID) | ORAL | Status: DC
  Administered 2022-02-18: 17 g via ORAL
  Filled 2022-02-18 (×13): qty 1

## 2022-02-18 MED ORDER — APIXABAN 2.5 MG PO TABS
2.5000 mg | ORAL_TABLET | Freq: Two times a day (BID) | ORAL | Status: DC
  Administered 2022-02-18 – 2022-02-19 (×4): 2.5 mg via ORAL
  Filled 2022-02-18 (×4): qty 1

## 2022-02-18 MED ORDER — METOPROLOL TARTRATE 12.5 MG HALF TABLET
12.5000 mg | ORAL_TABLET | Freq: Two times a day (BID) | ORAL | Status: DC
  Administered 2022-02-18: 12.5 mg via ORAL
  Filled 2022-02-18: qty 1

## 2022-02-18 NOTE — Progress Notes (Signed)
Patient was complaining of blurry vision this evening.  The daughter said that pt wasn't able to reach the cup as if she wasn't able to locate where it is. Pt was able to answer how many fingers I was showing and didn't have problem following my fingers. Pt was able to push and pull to test strength of upper extremities. Notified Dr. Waldron Labs. Head CT ordered.

## 2022-02-18 NOTE — Progress Notes (Signed)
Rounding Note    Patient Name: Charlotte Powell Date of Encounter: 02/18/2022  [facility] Cardiologist: Berniece Salines, DO   Subjective   No dyspnea or CP; cough noted  Inpatient Medications    Scheduled Meds:  benzonatate  200 mg Oral TID   dextromethorphan  30 mg Oral BID   diltiazem  120 mg Oral Daily   docusate sodium  100 mg Oral BID   feeding supplement  1 Container Oral TID BM   feeding supplement  237 mL Oral BID BM   fluticasone  1 spray Each Nare Daily   gabapentin  300 mg Oral QHS   guaiFENesin  600 mg Oral BID   melatonin  5 mg Oral QHS   metoprolol tartrate  12.5 mg Oral BID   mirtazapine  7.5 mg Oral QHS   pantoprazole (PROTONIX) IV  40 mg Intravenous Q12H   polyethylene glycol  17 g Oral BID   rosuvastatin  20 mg Oral Daily   Continuous Infusions:  PRN Meds: acetaminophen **OR** acetaminophen, levalbuterol, menthol-cetylpyridinium, ondansetron **OR** ondansetron (ZOFRAN) IV, prochlorperazine   Vital Signs    Vitals:   02/17/22 1720 02/17/22 1941 02/18/22 0005 02/18/22 0413  BP: 112/64 116/61 139/64 112/70  Pulse: 67 71 75 84  Resp: 20 20 20 20   Temp: 98.3 F (36.8 C) 98.3 F (36.8 C) 97.9 F (36.6 C) 97.9 F (36.6 C)  TempSrc: Oral Oral Oral Oral  SpO2: 97% 96% 94% 94%  Weight:   64.2 kg   Height:        Intake/Output Summary (Last 24 hours) at 02/18/2022 0745 Last data filed at 02/18/2022 0416 Gross per 24 hour  Intake 360 ml  Output 540 ml  Net -180 ml       02/18/2022   12:05 AM 02/17/2022   12:27 AM 02/16/2022   12:18 AM  Last 3 Weights  Weight (lbs) 141 lb 8.6 oz 145 lb 4.5 oz 143 lb 4.8 oz  Weight (kg) 64.2 kg 65.9 kg 65 kg      Telemetry    Atrial fibrillation rate elevated - Personally Reviewed  Physical Exam   GEN: NAD well developed Neck: no JVD Cardiac: irregular, tachycardic Respiratory: CTA; no wheeze GI: Soft, NT/ND, no masses MS: No edema Neuro:  No focal findings Psych: Normal affect   Labs     High Sensitivity Troponin:   Recent Labs  Lab 02/12/22 1257 02/12/22 1625 02/13/22 0554  TROPONINIHS 49* 47* 42*      Chemistry Recent Labs  Lab 02/12/22 1257 02/12/22 1328 02/13/22 0222 02/14/22 0130 02/14/22 0551 02/15/22 0047 02/16/22 0039  NA 132*   < > 131* 135  --  133* 130*  K 3.6   < > 3.4* 4.4  --  3.2* 4.1  CL 94*   < > 97* 100  --  100 100  CO2 21*   < > 26 20*  --  25 23  GLUCOSE 81   < > 117* 92  --  80 85  BUN 20   < > 22 28*  --  26* 23  CREATININE 0.95   < > 0.95 1.01*  --  0.84 0.90  CALCIUM 8.8*   < > 8.8* 8.6*  --  8.2* 8.1*  MG  --    < >  --   --  1.7 1.9 1.7  PROT 6.2*  --  5.5*  --   --   --   --  ALBUMIN 2.9*  --  2.5*  --   --   --   --   AST 39  --  28  --   --   --   --   ALT 17  --  15  --   --   --   --   ALKPHOS 125  --  104  --   --   --   --   BILITOT 2.2*  --  2.4*  --   --   --   --   GFRNONAA 57*   < > 57* 53*  --  >60 >60  ANIONGAP 17*   < > 8 15  --  8 7   < > = values in this interval not displayed.      Hematology Recent Labs  Lab 02/15/22 0047 02/16/22 0039 02/18/22 0104  WBC 7.4 7.7 6.3  RBC 3.04* 2.93* 2.25*  HGB 10.7* 10.5* 8.1*  HCT 31.0* 30.2* 22.9*  MCV 102.0* 103.1* 101.8*  MCH 35.2* 35.8* 36.0*  MCHC 34.5 34.8 35.4  RDW 13.9 14.0 14.0  PLT 147* 147* 120*    Thyroid  Recent Labs  Lab 02/13/22 0222  TSH 3.537  FREET4 1.69*     BNP Recent Labs  Lab 02/12/22 1912  BNP 235.1*      Patient Profile     89 y.o. female with past medical history of coronary artery disease, hypertension, hyperlipidemia, permanent atrial fibrillation admitted with weakness, malaise, decreased p.o. intake, possible pneumonia as well as atrial fibrillation with elevated rate.  Echocardiogram this admission shows ejection fraction 60 to 65%, mild right ventricular enlargement, moderate left atrial enlargement, mild mitral regurgitation, moderate tricuspid regurgitation.  CTA showed no pulmonary embolus and small bilateral  pleural effusions.  Assessment & Plan    1 permanent atrial fibrillation-heart rate is elevated today.  Cardizem was changed to 120 mg daily yesterday and metoprolol 12.5 mg twice daily added.  Will increase metoprolol to 25 mg twice daily and follow heart rate.  Note she is anemic this morning with hemoglobin decreased to 8.1.  No obvious active bleeding.  Anemia may be driving heart rate.  Further evaluation/management per primary service.  Will continue apixaban for now.    2 history of coronary artery disease-continue Crestor.  No aspirin given need for apixaban.  For questions or updates, please contact [place] Please consult [email or web address] for contact info under        Signed, Kirk Ruths, MD  02/18/2022, 7:45 AM

## 2022-02-18 NOTE — TOC Initial Note (Signed)
Transition of Care [facility]) - Initial/Assessment Note    Patient Details  Name: Charlotte Powell MRN: [id number] Date of Birth: 1928/04/04  Transition of Care [facility]) CM/SW Contact:    Zenon Mayo, RN Phone Number: 02/18/2022, 3:50 PM  Clinical Narrative:                 From [facility], Afib with RVR, TOC following.  Expected Discharge Plan: Assisted Living Barriers to Discharge: Continued Medical Work up   Patient Goals and CMS Choice            Expected Discharge Plan and Services In-house Referral: NA     Living arrangements for the past 2 months: [place]                   DME Agency: NA                  Prior Living Arrangements/Services Living arrangements for the past 2 months: [place] Lives with:: Facility Resident Patient language and need for interpreter reviewed:: Yes              Criminal Activity/Legal Involvement Pertinent to Current Situation/Hospitalization: No - Comment as needed  Activities of Daily Living Home Assistive Devices/Equipment: Wheelchair ADL Screening (condition at time of admission) Patient's cognitive ability adequate to safely complete daily activities?: Yes Is the patient deaf or have difficulty hearing?: Yes Does the patient have difficulty seeing, even when wearing glasses/contacts?: Yes Does the patient have difficulty concentrating, remembering, or making decisions?: No Patient able to express need for assistance with ADLs?: Yes Does the patient have difficulty dressing or bathing?: Yes Independently performs ADLs?: No Communication: Appropriate for developmental age Dressing (OT): Needs assistance Is this a change from baseline?: Pre-admission baseline Grooming: Needs assistance Is this a change from baseline?: Pre-admission baseline Feeding: Independent Is this a change from baseline?: Pre-admission baseline Bathing: Needs assistance Is this a change from  baseline?: Pre-admission baseline Toileting: Needs assistance Is this a change from baseline?: Pre-admission baseline In/Out Bed: Needs assistance Is this a change from baseline?: Pre-admission baseline Walks in Home:  (Not able to ambulate, wheelchair bound) Does the patient have difficulty walking or climbing stairs?: No Weakness of Legs: Both Weakness of Arms/Hands: Both  Permission Sought/Granted                  Emotional Assessment              Admission diagnosis:  Atrial fibrillation with RVR ([place]) [I48.91] Chronic atrial fibrillation with RVR ([place]) [I48.20] Chest pain, unspecified type [R07.9] Patient Active Problem List   Diagnosis Date Noted   Chronic atrial fibrillation with RVR ([place]) 02/12/2022   Rotavirus infection 01/17/2022   Nausea, vomiting, and diarrhea 01/16/2022   Compression fracture of spine ([place]) 01/16/2022   Thrombocytopenia ([place]) 01/16/2022   Hypothyroidism 01/16/2022   Nausea vomiting and diarrhea 01/16/2022   Acute exacerbation of chronic low back pain 11/30/2021   Chronic left shoulder pain 11/30/2021   Obtundation 11/28/2021   Medication adverse effect 11/27/2021   PAF (paroxysmal atrial fibrillation) ([place]) 04/22/2021   Chest pain of uncertain etiology 42/68/3419   Coronary artery disease involving native coronary artery of native heart without angina pectoris 04/22/2021   Chronic anticoagulation 04/22/2021   Atrial fibrillation with rapid ventricular response ([place]) 04/14/2021   Left upper arm pain 04/14/2021   Secondary hypercoagulable state ([place]) 03/21/2021   Viral gastroenteritis 02/09/2021   DNR (do not resuscitate)/DNI(Do  Not Intubate) 02/09/2021   HLD (hyperlipidemia) 02/09/2021   CAD (coronary artery disease) 02/09/2021   Persistent atrial fibrillation ([place]) 11/16/2020   HTN (hypertension) 11/16/2020   PCP:  Sandrea Hughs, NP Pharmacy:   [facility] [place] [place] [place] [address] Phone: [phone] Fax: [place] [address] [place] [address] Phone: [phone] Fax: [phone]  [facility] [address] [place] [address] Phone: [phone] Fax: [phone]     Social Determinants of Health (SDOH) Social History: [place]: No Food Insecurity (02/12/2022)  Housing: Low Risk  (02/12/2022)  Transportation Needs: No Transportation Needs (02/12/2022)  Utilities: Not At Risk (02/12/2022)  Tobacco Use: Low Risk  (02/12/2022)   SDOH Interventions:     Readmission Risk Interventions    02/18/2022    3:48 PM  Readmission Risk Prevention Plan  Transportation Screening Complete  Palliative Care Screening Not Applicable  Medication Review (RN Care Manager) Complete

## 2022-02-18 NOTE — Progress Notes (Signed)
   02/18/22 0850  Assess: MEWS Score  Temp (!) 97.5 F (36.4 C)  BP 117/84  Pulse Rate (!) 119  Resp 20  SpO2 100 %  O2 Device Room Air  Assess: MEWS Score  MEWS Temp 0  MEWS Systolic 0  MEWS Pulse 2  MEWS RR 0  MEWS LOC 0  MEWS Score 2  MEWS Score Color Yellow  Assess: if the MEWS score is Yellow or Red  Were vital signs taken at a resting state? Yes  Focused Assessment No change from prior assessment  Does the patient meet 2 or more of the SIRS criteria? No  Does the patient have a confirmed or suspected source of infection? No  Provider and Rapid Response Notified? No  MEWS guidelines implemented *See Row Information* No, previously yellow, continue vital signs every 4 hours  Assess: SIRS CRITERIA  SIRS Temperature  0  SIRS Pulse 1  SIRS Respirations  0  SIRS WBC 0  SIRS Score Sum  1

## 2022-02-18 NOTE — Progress Notes (Signed)
[facility] Progress Note Patient: Charlotte Powell IEP:[id number] DOB: 16-Nov-1928 DOA: 02/12/2022  DOS: the patient was seen and examined on 02/18/2022  Brief hospital course:  Zeinab Rodwell is a 89 y.o. female with PMH significant of chronic A-fib, CAD, GERD, HTN, HLD, hypothyroidism. Present to the hospital with complaints of shortness of breath and nausea. Also had some substernal chest pain. Found to have A-fib with RVR. Cardiology consulted.  Required Cardizem drip intermittently, medication has been adjusted frequently by cardiology, CTPE negative for pulmonary embolism.  Assessment and Plan:  Patient had significant drop in her hemoglobin this a.m. at 8.1, there was no significant evidence of GI bleed, no melena, so labs were repeated it is at 12.3, will continue to monitor closely.  Patient is more sleepy today, this is most likely to Remeron started overnight to stimulate her appetite, I will DC Remeron for now.   Chronic A-fib with RVR. Started on Cardizem infusion in the ER. Recently had an echocardiogram which showed preserved EF. Repeat echocardiogram also shows preserved EF this admission. Monitor on telemetry.  -Heart rate remains difficult to control where she has been requiring Cardizem drip intermittently, medication has been adjusted by cardiology today, where her beta-blocker dose has been increased.   -Continue with Eliquis for anticoagulation    Bilateral basal crackles. Left-sided infiltrate>> bibasilar atelectasis Bilateral lower extremity edema. Recently hospitalized. Procalcitonin is negative. Less likely pneumonia. CT PE protocol negative for pulmonary embolism. Lower extremity Doppler negative for DVT. Lower extremity does not appear to be actively infected for now.  Swelling improving. -She was encouraged again today to use incentive spirometry and flutter valve  Intractable nausea. she was started on IV Protonix twice daily, much improved  currently   Chronic anticoagulation. For A-fib on Eliquis. Continue present   Chronic neuropathy. Continue gabapentin nightly.   Hypothyroidism. Holding Synthroid due to Normal TSH and elevated free T4.  In the setting of RVR.   GERD. Continue PPI.   HLD. Continue statin.   Abdominal pain. Unremarkable x-ray abdomen. Likely from cough.  Monitor. Resolved  Hypotension. Dehydration. Hypoalbuminemia. Patient was given IV fluid by cardiology. I gave her some IV albumin.  Monitor.  Hyponatremia -Mild, likely due to poor oral intake and low solute, she was encouraged to drink her boost and supplements, she will be started on iron as well, will start on sodium tablets     Subjective:   Reports cough and dyspnea has improved, still reports generalized weakness and fatigue, denies any chest pain .   Physical Exam:  Sleepy today, but wakes up, answering questions appropriately, frail  Decreased air entry at the bases RRR,No Gallops,Rubs or new Murmurs, No Parasternal Heave +ve B.Sounds, Abd Soft, No tenderness, No rebound - guarding or rigidity. No Cyanosis, Clubbing or edema, No new Rash or bruise     Data Reviewed: I have Reviewed nursing notes, Vitals, and Lab results. Since last encounter, pertinent lab results CBC and BMP   . I have ordered test including CBC and BMP  .   Disposition: Status is: Inpatient Remains inpatient appropriate because: Need IV rate control  apixaban (ELIQUIS) tablet 2.5 mg Start: 02/18/22 1200 apixaban (ELIQUIS) tablet 2.5 mg   Family Communication: Daughter at bedside daily Level of care: Progressive Continue progressive care. Vitals:   02/18/22 0005 02/18/22 0413 02/18/22 0850 02/18/22 1034  BP: 139/64 112/70 117/84 104/72  Pulse: 75 84 (!) 119 75  Resp: 20 20 20 20   Temp: 97.9 F (36.6 C) 97.9 F (  36.6 C) (!) 97.5 F (36.4 C)   TempSrc: Oral Oral Oral   SpO2: 94% 94% 100% 95%  Weight: 64.2 kg     Height:          Author: Phillips Climes, MD 02/18/2022 12:37 PM  Please look on [email or web address] to find out who is on call.

## 2022-02-18 NOTE — Progress Notes (Signed)
At approximately 0100 patient's heart rate increased and her rhythm remained afib rvr rate in 130-140's sustained.  Patient is asymptomatic and denies complaints.  Dr. [place] Crane notified and new orders were given and carried out.  Patient rate has slowed to low 100's and she continues to deny complaints.

## 2022-02-18 NOTE — Progress Notes (Signed)
PT Cancellation Note  Patient Details Name: Charlotte Powell MRN: [id number] DOB: November 14, 1928   Cancelled Treatment:    Reason Eval/Treat Not Completed: Other (comment).  Refused therapy including bed level visit, may reattempt as time and pt allow.   Ramond Dial 02/18/2022, 12:18 PM  Mee Hives, PT PhD Acute Rehab Dept. Number: [place]

## 2022-02-18 NOTE — Progress Notes (Signed)
Pt sleepy this morning, per daughter at bedside this is not " normal" for the patient. Her left arm swollen as well. Informing MD to d/c sleeping pill that was given at bedtime.   Will continue to monitor. Pt so far not able to take her morning pills.

## 2022-02-18 NOTE — Care Management Important Message (Signed)
Important Message  Patient Details  Name: Brandilynn Taormina MRN: [id number] Date of Birth: 1929-01-08   Medicare Important Message Given:  Yes     Shelda Altes 02/18/2022, 10:52 AM

## 2022-02-19 DIAGNOSIS — E785 Hyperlipidemia, unspecified: Secondary | ICD-10-CM

## 2022-02-19 DIAGNOSIS — I482 Chronic atrial fibrillation, unspecified: Secondary | ICD-10-CM | POA: Diagnosis not present

## 2022-02-19 DIAGNOSIS — I952 Hypotension due to drugs: Secondary | ICD-10-CM | POA: Diagnosis not present

## 2022-02-19 DIAGNOSIS — R627 Adult failure to thrive: Secondary | ICD-10-CM | POA: Diagnosis not present

## 2022-02-19 DIAGNOSIS — I4891 Unspecified atrial fibrillation: Secondary | ICD-10-CM | POA: Diagnosis not present

## 2022-02-19 LAB — CBC
HCT: 37.1 % (ref 36.0–46.0)
Hemoglobin: 12.1 g/dL (ref 12.0–15.0)
MCH: 34.3 pg — ABNORMAL HIGH (ref 26.0–34.0)
MCHC: 32.6 g/dL (ref 30.0–36.0)
MCV: 105.1 fL — ABNORMAL HIGH (ref 80.0–100.0)
Platelets: 156 10*3/uL (ref 150–400)
RBC: 3.53 MIL/uL — ABNORMAL LOW (ref 3.87–5.11)
RDW: 14.4 % (ref 11.5–15.5)
WBC: 6.9 10*3/uL (ref 4.0–10.5)
nRBC: 0 % (ref 0.0–0.2)

## 2022-02-19 LAB — BASIC METABOLIC PANEL
Anion gap: 11 (ref 5–15)
BUN: 14 mg/dL (ref 8–23)
CO2: 22 mmol/L (ref 22–32)
Calcium: 8.2 mg/dL — ABNORMAL LOW (ref 8.9–10.3)
Chloride: 101 mmol/L (ref 98–111)
Creatinine, Ser: 0.93 mg/dL (ref 0.44–1.00)
GFR, Estimated: 58 mL/min — ABNORMAL LOW (ref >60)
Glucose, Bld: 105 mg/dL — ABNORMAL HIGH (ref 70–99)
Potassium: 3.3 mmol/L — ABNORMAL LOW (ref 3.5–5.1)
Sodium: 134 mmol/L — ABNORMAL LOW (ref 135–145)

## 2022-02-19 MED ORDER — POTASSIUM CHLORIDE CRYS ER 20 MEQ PO TBCR
40.0000 meq | EXTENDED_RELEASE_TABLET | Freq: Once | ORAL | Status: AC
  Administered 2022-02-19: 40 meq via ORAL
  Filled 2022-02-19: qty 2

## 2022-02-19 MED ORDER — SODIUM CHLORIDE 0.9 % IV BOLUS
250.0000 mL | Freq: Once | INTRAVENOUS | Status: AC
  Administered 2022-02-19: 250 mL via INTRAVENOUS

## 2022-02-19 NOTE — Plan of Care (Signed)
Pt has a poor appetite and only eating small portions of each meal. Pt had low urine output this shift; MD Ghimire notified.  Problem: Education: Goal: Knowledge of disease or condition will improve Outcome: Progressing Goal: Understanding of medication regimen will improve 02/19/2022 1837 by Donzetta Sprung, RN Outcome: Progressing 02/19/2022 1836 by Donzetta Sprung, RN Outcome: Progressing Goal: Individualized Educational Video(s) Outcome: Progressing   Problem: Activity: Goal: Ability to tolerate increased activity will improve 02/19/2022 1836 by Donzetta Sprung, RN Outcome: Progressing 02/19/2022 1834 by Donzetta Sprung, RN Outcome: Not Progressing   Problem: Cardiac: Goal: Ability to achieve and maintain adequate cardiopulmonary perfusion will improve 02/19/2022 1837 by Donzetta Sprung, RN Outcome: Progressing 02/19/2022 1836 by Donzetta Sprung, RN Outcome: Progressing 02/19/2022 1834 by Donzetta Sprung, RN Outcome: Progressing   Problem: Health Behavior/Discharge Planning: Goal: Ability to safely manage health-related needs after discharge will improve Outcome: Progressing   Problem: Education: Goal: Knowledge of General Education information will improve Description: Including pain rating scale, medication(s)/side effects and non-pharmacologic comfort measures Outcome: Progressing   Problem: Health Behavior/Discharge Planning: Goal: Ability to manage health-related needs will improve Outcome: Progressing   Problem: Clinical Measurements: Goal: Ability to maintain clinical measurements within normal limits will improve 02/19/2022 1837 by Donzetta Sprung, RN Outcome: Progressing 02/19/2022 1836 by Donzetta Sprung, RN Outcome: Progressing 02/19/2022 1834 by Donzetta Sprung, RN Outcome: Progressing Goal: Will remain free from infection 02/19/2022 1837 by Donzetta Sprung, RN Outcome: Progressing 02/19/2022 1836 by Donzetta Sprung, RN Outcome: Progressing 02/19/2022 1834 by Donzetta Sprung, RN Outcome: Progressing Goal: Diagnostic test results will improve 02/19/2022 1837 by Donzetta Sprung, RN Outcome: Progressing 02/19/2022 1836 by Donzetta Sprung, RN Outcome: Progressing 02/19/2022 1834 by Donzetta Sprung, RN Outcome: Progressing Goal: Respiratory complications will improve 02/19/2022 1837 by Donzetta Sprung, RN Outcome: Progressing 02/19/2022 1836 by Donzetta Sprung, RN Outcome: Progressing 02/19/2022 1834 by Donzetta Sprung, RN Outcome: Progressing Goal: Cardiovascular complication will be avoided 02/19/2022 1837 by Donzetta Sprung, RN Outcome: Progressing 02/19/2022 1836 by Donzetta Sprung, RN Outcome: Progressing 02/19/2022 1834 by Donzetta Sprung, RN Outcome: Progressing   Problem: Activity: Goal: Risk for activity intolerance will decrease Outcome: Progressing   Problem: Coping: Goal: Level of anxiety will decrease 02/19/2022 1836 by Donzetta Sprung, RN Outcome: Progressing 02/19/2022 1834 by Donzetta Sprung, RN Outcome: Progressing   Problem: Elimination: Goal: Will not experience complications related to bowel motility 02/19/2022 1837 by Donzetta Sprung, RN Outcome: Progressing 02/19/2022 1836 by Donzetta Sprung, RN Outcome: Progressing 02/19/2022 1834 by Donzetta Sprung, RN Outcome: Progressing Goal: Will not experience complications related to urinary retention 02/19/2022 1836 by Donzetta Sprung, RN Outcome: Progressing 02/19/2022 1834 by Donzetta Sprung, RN Outcome: Not Progressing   Problem: Safety: Goal: Ability to remain free from injury will improve Outcome: Progressing   Problem: Skin Integrity: Goal: Risk for impaired skin integrity will decrease Outcome: Progressing   Problem: Nutrition: Goal: Adequate nutrition will be maintained 02/19/2022 1837 by Donzetta Sprung, RN Outcome: Not Progressing 02/19/2022 1836 by Donzetta Sprung, RN Outcome: Progressing 02/19/2022 1834 by Donzetta Sprung, RN Outcome: Not Progressing

## 2022-02-19 NOTE — Progress Notes (Addendum)
Rounding Note    Patient Name: Charlotte Powell Date of Encounter: 02/19/2022  [place] Cardiologist: Berniece Salines, DO   Subjective   Denies CP or dyspnea  Inpatient Medications    Scheduled Meds:  apixaban  2.5 mg Oral BID   benzonatate  200 mg Oral TID   dextromethorphan  30 mg Oral BID   diltiazem  120 mg Oral Daily   docusate sodium  100 mg Oral BID   feeding supplement  1 Container Oral TID BM   feeding supplement  237 mL Oral BID BM   fluticasone  1 spray Each Nare Daily   gabapentin  300 mg Oral QHS   guaiFENesin  600 mg Oral BID   melatonin  5 mg Oral QHS   metoprolol tartrate  25 mg Oral BID   pantoprazole (PROTONIX) IV  40 mg Intravenous Q12H   polyethylene glycol  17 g Oral BID   potassium chloride  40 mEq Oral Once   rosuvastatin  20 mg Oral Daily   Continuous Infusions:  PRN Meds: acetaminophen **OR** acetaminophen, levalbuterol, menthol-cetylpyridinium, ondansetron **OR** ondansetron (ZOFRAN) IV, prochlorperazine   Vital Signs    Vitals:   02/18/22 1935 02/19/22 0008 02/19/22 0400 02/19/22 0610  BP: (!) 100/56 (!) 100/54 (!) 103/52 (!) 105/59  Pulse: 61 61 (!) 109 (!) 117  Resp: 20 15 18 19   Temp: 99.4 F (37.4 C) 98.1 F (36.7 C) 98.1 F (36.7 C) (!) 97.5 F (36.4 C)  TempSrc: Oral Oral Oral Oral  SpO2: 96% 96% 92% 94%  Weight:  64.6 kg    Height:        Intake/Output Summary (Last 24 hours) at 02/19/2022 0821 Last data filed at 02/19/2022 0410 Gross per 24 hour  Intake 880 ml  Output 400 ml  Net 480 ml       02/19/2022   12:08 AM 02/18/2022   12:05 AM 02/17/2022   12:27 AM  Last 3 Weights  Weight (lbs) 142 lb 6.7 oz 141 lb 8.6 oz 145 lb 4.5 oz  Weight (kg) 64.6 kg 64.2 kg 65.9 kg      Telemetry    Atrial fibrillation rate upper normal majority of day but increased this AM - Personally Reviewed  Physical Exam   GEN: NAD Neck: supple Cardiac: irregular and tachycardia Respiratory: CTA; no rhonchi GI: Soft, NT/ND MS:  No edema Neuro: Grossly intact Psych: Normal affect   Labs    High Sensitivity Troponin:   Recent Labs  Lab 02/12/22 1257 02/12/22 1625 02/13/22 0554  TROPONINIHS 49* 47* 42*      Chemistry Recent Labs  Lab 02/12/22 1257 02/12/22 1328 02/13/22 0222 02/14/22 0130 02/14/22 0551 02/15/22 0047 02/16/22 0039 02/19/22 0037  NA 132*   < > 131*   < >  --  133* 130* 134*  K 3.6   < > 3.4*   < >  --  3.2* 4.1 3.3*  CL 94*   < > 97*   < >  --  100 100 101  CO2 21*   < > 26   < >  --  25 23 22   GLUCOSE 81   < > 117*   < >  --  80 85 105*  BUN 20   < > 22   < >  --  26* 23 14  CREATININE 0.95   < > 0.95   < >  --  0.84 0.90 0.93  CALCIUM 8.8*   < >  8.8*   < >  --  8.2* 8.1* 8.2*  MG  --    < >  --   --  1.7 1.9 1.7  --   PROT 6.2*  --  5.5*  --   --   --   --   --   ALBUMIN 2.9*  --  2.5*  --   --   --   --   --   AST 39  --  28  --   --   --   --   --   ALT 17  --  15  --   --   --   --   --   ALKPHOS 125  --  104  --   --   --   --   --   BILITOT 2.2*  --  2.4*  --   --   --   --   --   GFRNONAA 57*   < > 57*   < >  --  >60 >60 58*  ANIONGAP 17*   < > 8   < >  --  8 7 11    < > = values in this interval not displayed.      Hematology Recent Labs  Lab 02/18/22 0104 02/18/22 0723 02/19/22 0037  WBC 6.3 6.3 6.9  RBC 2.25* 3.48* 3.53*  HGB 8.1* 12.3 12.1  HCT 22.9* 36.0 37.1  MCV 101.8* 103.4* 105.1*  MCH 36.0* 35.3* 34.3*  MCHC 35.4 34.2 32.6  RDW 14.0 14.1 14.4  PLT 120* 142* 156    Thyroid  Recent Labs  Lab 02/13/22 0222  TSH 3.537  FREET4 1.69*     BNP Recent Labs  Lab 02/12/22 1912  BNP 235.1*      Patient Profile     89 y.o. female with past medical history of coronary artery disease, hypertension, hyperlipidemia, permanent atrial fibrillation admitted with weakness, malaise, decreased p.o. intake, possible pneumonia as well as atrial fibrillation with elevated rate.  Echocardiogram this admission shows ejection fraction 60 to 65%, mild right  ventricular enlargement, moderate left atrial enlargement, mild mitral regurgitation, moderate tricuspid regurgitation.  CTA showed no pulmonary embolus and small bilateral pleural effusions.  Assessment & Plan    1 permanent atrial fibrillation-heart rate was controlled the majority of the day yesterday but did have times of elevation.  Her Cardizem has recently been increased and metoprolol increased yesterday.  Blood pressure is borderline.  Also with occasional short pauses.  Patient's daughter stated they would not want to consider pacemaker if patient developed tachybradycardia syndrome.  Therefore would continue present dose of Cardizem and metoprolol and follow on telemetry.  Could add amiodarone or digoxin later if necessary.  Continue apixaban.  Note dose should be 5 mg twice daily if her hemoglobin is stable.  2 history of coronary artery disease-continue Crestor.  No aspirin given need for apixaban.  Addendum after receiving a.m. dose of Cardizem CD 120 mg and metoprolol 25 mg patient developed hypotension with systolic blood pressure of 75 and bradycardia with heart rate in the 30s and 40s.  Patient was given IV fluids with systolic increasing to 86.  Long discussion with patient's daughter.  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  She does not want to consider pacemaker at this point.  We will therefore discontinue metoprolol and continue Cardizem.  We will need to allow the patient's heart rate to run higher to avoid significant bradycardia and hypotension.  Patient's daughter seems to understand.  For questions or updates, please contact [facility] Please consult [email or web address] for contact info under        Signed, Olga Millers, MD  02/19/2022, 8:21 AM

## 2022-02-19 NOTE — Progress Notes (Signed)
OT Cancellation Note  Patient Details Name: Charlotte Powell MRN: [id number] DOB: April 02, 1928   Cancelled Treatment:    Reason Eval/Treat Not Completed: Medical issues which prohibited therapy (pt hypotensive with BP of 79/48)  Malka So 02/19/2022, 11:16 AM Cleta Alberts, OTR/L Acute Rehabilitation Services Office: [phone]

## 2022-02-19 NOTE — Plan of Care (Signed)
  Problem: Activity: Goal: Ability to tolerate increased activity will improve Outcome: Progressing   Problem: Clinical Measurements: Goal: Respiratory complications will improve Outcome: Progressing   

## 2022-02-19 NOTE — Progress Notes (Signed)
   Called by nursing with concern for hypotension and bradycardia. She has been in Afib RVR with treatment of Diltiazem/metoprolol. BP was soft this morning, therefore meds were not increased. She has now transitioned to afib with SVR, rates dipping into the 30s at times, up to the 50s. BP dropped in the 70s, last check 85/60. She is lethargic but opens eyes/responds to verbal stimuli.  -- ordered for 250cc NS bolus, stopped metoprolol/Diltiazem for now -- spoke with daughter at the bedside regarding tachy/brady syndrome and she again confirms they would not want a PPM -- did speak with daughter regarding if the need for temp wire should arise, she is not sure whether she would want to proceed with this procedure   Will give the IVFs and follow up regarding patient status. Update rounding team.  Signed, Reino Bellis, NP-C 02/19/2022, 10:33 AM Pager: [phone]

## 2022-02-19 NOTE — Progress Notes (Signed)
Pt asymptomatic. Fluid bolus given. BP returned to normal limits.   02/19/22 0946  Assess: MEWS Score  BP (!) 79/47  MAP (mmHg) (!) 58  Pulse Rate (!) 49  ECG Heart Rate (!) 49  Resp (!) 24  SpO2 96 %  Assess: MEWS Score  MEWS Temp 0  MEWS Systolic 2  MEWS Pulse 1  MEWS RR 1  MEWS LOC 0  MEWS Score 4  MEWS Score Color Red  Assess: if the MEWS score is Yellow or Red  Were vital signs taken at a resting state? Yes  Focused Assessment No change from prior assessment  Does the patient meet 2 or more of the SIRS criteria? No  Does the patient have a confirmed or suspected source of infection? No  Provider and Rapid Response Notified? No  MEWS guidelines implemented *See Row Information* Yes  Treat  MEWS Interventions Escalated (See documentation below)  Take Vital Signs  Increase Vital Sign Frequency  Red: Q 1hr X 4 then Q 4hr X 4, if remains red, continue Q 4hrs  Escalate  MEWS: Escalate Red: discuss with charge nurse/RN and provider, consider discussing with RRT  Notify: Charge Nurse/RN  Name of Charge Nurse/RN Notified Beverlee Nims RN  Date Charge Nurse/RN Notified 02/19/22  Time Charge Nurse/RN Notified 1000  Provider Notification  Provider Name/Title Reino Bellis  Date Provider Notified 02/19/22  Time Provider Notified [phone]  Method of Notification Page  Notification Reason Other (Comment) (low BP)  Provider response See new orders  Date of Provider Response 02/19/22  Time of Provider Response [phone]  Document  Patient Outcome Stabilized after interventions  Progress note created (see row info) Yes  Assess: SIRS CRITERIA  SIRS Temperature  0  SIRS Pulse 0  SIRS Respirations  1  SIRS WBC 0  SIRS Score Sum  1

## 2022-02-19 NOTE — Progress Notes (Signed)
PROGRESS NOTE        PATIENT DETAILS Name: Charlotte Powell Age: 89 y.o. Sex: female Date of Birth: 19-Jun-1928 Admit Date: 02/12/2022 Admitting Physician Lavina Hamman, MD [id number], Nelda Bucks, NP  Brief Summary: Patient is a 89 y.o.  female with chronic atrial fibrillation, HTN, HLD, hypothyroidism-shortness of breath-patient was found to have A-fib RVR.  Significant events: 12/27>> admit to [facility]-A-fib RVR  Significant studies: 12/28>> CTA chest: No PE, small bilateral pleural effusions. 01/03>> CT head: No acute intracranial abnormality.  Significant microbiology data: 12/27>> COVID/influenza/RSV PCR: Negative 12/27>> blood culture: No growth  Procedures: None  Consults: None  Subjective: Remains in RVR-feels weak-poor appetite continues.  Does not want to get out of bed.  Objective: Vitals: Blood pressure (!) 79/48, pulse (!) 33, temperature (!) 97.3 F (36.3 C), temperature source Oral, resp. rate (!) 28, height 5\' 3"  (1.6 m), weight 64.6 kg, SpO2 95 %.   Exam: Gen Exam:Alert awake-not in any distress HEENT:atraumatic, normocephalic Chest: B/L clear to auscultation anteriorly CVS:S1S2 regular-tachycardic. Abdomen:soft non tender, non distended Extremities:no edema Neurology: Non focal Skin: no rash  Pertinent Labs/Radiology:    Latest Ref Rng & Units 02/19/2022   12:37 AM 02/18/2022    7:23 AM 02/18/2022    1:04 AM  CBC  WBC 4.0 - 10.5 K/uL 6.9  6.3  6.3   Hemoglobin 12.0 - 15.0 g/dL 12.1  12.3  8.1   Hematocrit 36.0 - 46.0 % 37.1  36.0  22.9   Platelets 150 - 400 K/uL 156  142  120     Lab Results  Component Value Date   NA 134 (L) 02/19/2022   K 3.3 (L) 02/19/2022   CL 101 02/19/2022   CO2 22 02/19/2022      Assessment/Plan: Chronic Afib with RVR Remains in RVR Suspected to have underlying sick sinus syndrome (bradycardia in the past with aggressive rate control) Blood pressure dropped this morning following  administration of Cardizem/metoprolol this morning.  Rate control medications discontinued-cardiology following-family not keen on pursuing PPM. Remains on Eliquis  Hypotension Occurred on 1/3-following administration of beta-blocker/Cardizem No evidence of sepsis/volume loss Cardiology giving a gentle bolus of IVF-will follow closely May need to start midodrine-if blood pressure drops again-could consider albumin infusion as well  Failure to thrive syndrome Continues to have generalized weakness/poor appetite Unfortunately-do not see any reversible causes at this point Needs to continue to work with PT/OT Prior MD tried Remeron but apparently caused sedation Liberalize diet-encourage use of nutritional supplements.  Abdominal pain Soft belly-benign exam Likely from cough X-ray abdomen negative Supportive care  Peripheral neuropathy Continue Neurontin  Hypothyroidism Resume Synthroid over the next few days.  HLD Continue statin  Hypokalemia Replete/recheck  Hyponatremia Mild-of no clinical significance  BMI: Estimated body mass index is 25.23 kg/m as calculated from the following:   Height as of this encounter: 5\' 3"  (1.6 m).   Weight as of this encounter: 64.6 kg.   Code status:   Code Status: DNR   DVT Prophylaxis: apixaban (ELIQUIS) tablet 2.5 mg Start: 02/18/22 1200 apixaban (ELIQUIS) tablet 2.5 mg    Family Communication: Daughter at bedside   Disposition Plan: Status is: Inpatient Remains inpatient appropriate because: Severity of illness   Planned Discharge Destination:Home health   Diet: Diet Order             Diet  Heart Room service appropriate? Yes; Fluid consistency: Thin  Diet effective now                     Antimicrobial agents: Anti-infectives (From admission, onward)    None        MEDICATIONS: Scheduled Meds:  apixaban  2.5 mg Oral BID   benzonatate  200 mg Oral TID   dextromethorphan  30 mg Oral BID   docusate  sodium  100 mg Oral BID   feeding supplement  1 Container Oral TID BM   feeding supplement  237 mL Oral BID BM   fluticasone  1 spray Each Nare Daily   gabapentin  300 mg Oral QHS   guaiFENesin  600 mg Oral BID   melatonin  5 mg Oral QHS   pantoprazole (PROTONIX) IV  40 mg Intravenous Q12H   polyethylene glycol  17 g Oral BID   rosuvastatin  20 mg Oral Daily   Continuous Infusions: PRN Meds:.acetaminophen **OR** acetaminophen, levalbuterol, menthol-cetylpyridinium, ondansetron **OR** ondansetron (ZOFRAN) IV, prochlorperazine   I have personally reviewed following labs and imaging studies  LABORATORY DATA: CBC: Recent Labs  Lab 02/15/22 0047 02/16/22 0039 02/18/22 0104 02/18/22 0723 02/19/22 0037  WBC 7.4 7.7 6.3 6.3 6.9  HGB 10.7* 10.5* 8.1* 12.3 12.1  HCT 31.0* 30.2* 22.9* 36.0 37.1  MCV 102.0* 103.1* 101.8* 103.4* 105.1*  PLT 147* 147* 120* 142* 027    Basic Metabolic Panel: Recent Labs  Lab 02/12/22 1912 02/13/22 0222 02/14/22 0130 02/14/22 0551 02/15/22 0047 02/16/22 0039 02/19/22 0037  NA 131* 131* 135  --  133* 130* 134*  K 3.7 3.4* 4.4  --  3.2* 4.1 3.3*  CL 95* 97* 100  --  100 100 101  CO2 27 26 20*  --  25 23 22   GLUCOSE 84 117* 92  --  80 85 105*  BUN 20 22 28*  --  26* 23 14  CREATININE 0.93 0.95 1.01*  --  0.84 0.90 0.93  CALCIUM 8.9 8.8* 8.6*  --  8.2* 8.1* 8.2*  MG 1.5*  --   --  1.7 1.9 1.7  --     GFR: Estimated Creatinine Clearance: 35.6 mL/min (by C-G formula based on SCr of 0.93 mg/dL).  Liver Function Tests: Recent Labs  Lab 02/12/22 1257 02/13/22 0222  AST 39 28  ALT 17 15  ALKPHOS 125 104  BILITOT 2.2* 2.4*  PROT 6.2* 5.5*  ALBUMIN 2.9* 2.5*   Recent Labs  Lab 02/14/22 0551  LIPASE 89*   No results for input(s): "AMMONIA" in the last 168 hours.  Coagulation Profile: No results for input(s): "INR", "PROTIME" in the last 168 hours.  Cardiac Enzymes: No results for input(s): "CKTOTAL", "CKMB", "CKMBINDEX", "TROPONINI"  in the last 168 hours.  BNP (last 3 results) No results for input(s): "PROBNP" in the last 8760 hours.  Lipid Profile: No results for input(s): "CHOL", "HDL", "LDLCALC", "TRIG", "CHOLHDL", "LDLDIRECT" in the last 72 hours.  Thyroid Function Tests: No results for input(s): "TSH", "T4TOTAL", "FREET4", "T3FREE", "THYROIDAB" in the last 72 hours.  Anemia Panel: No results for input(s): "VITAMINB12", "FOLATE", "FERRITIN", "TIBC", "IRON", "RETICCTPCT" in the last 72 hours.  Urine analysis:    Component Value Date/Time   COLORURINE AMBER (A) 02/12/2022 1523   APPEARANCEUR HAZY (A) 02/12/2022 1523   LABSPEC 1.030 02/12/2022 1523   PHURINE 5.0 02/12/2022 1523   GLUCOSEU NEGATIVE 02/12/2022 1523   HGBUR NEGATIVE 02/12/2022 1523   BILIRUBINUR NEGATIVE  02/12/2022 1523   KETONESUR NEGATIVE 02/12/2022 1523   PROTEINUR 100 (A) 02/12/2022 1523   NITRITE NEGATIVE 02/12/2022 1523   LEUKOCYTESUR NEGATIVE 02/12/2022 1523    Sepsis Labs: Lactic Acid, Venous    Component Value Date/Time   LATICACIDVEN 1.3 02/13/2022 0222    MICROBIOLOGY: Recent Results (from the past 240 hour(s))  Resp panel by RT-PCR (RSV, Flu A&B, Covid) Anterior Nasal Swab     Status: None   Collection Time: 02/12/22 12:57 PM   Specimen: Anterior Nasal Swab  Result Value Ref Range Status   SARS Coronavirus 2 by RT PCR NEGATIVE NEGATIVE Final    Comment: (NOTE) SARS-CoV-2 target nucleic acids are NOT DETECTED.  The SARS-CoV-2 RNA is generally detectable in upper respiratory specimens during the acute phase of infection. The lowest concentration of SARS-CoV-2 viral copies this assay can detect is 138 copies/mL. A negative result does not preclude SARS-Cov-2 infection and should not be used as the sole basis for treatment or other patient management decisions. A negative result may occur with  improper specimen collection/handling, submission of specimen other than nasopharyngeal swab, presence of viral mutation(s)  within the areas targeted by this assay, and inadequate number of viral copies(<138 copies/mL). A negative result must be combined with clinical observations, patient history, and epidemiological information. The expected result is Negative.  Fact Sheet for Patients:  EntrepreneurPulse.com.au  Fact Sheet for Healthcare Providers:  IncredibleEmployment.be  This test is no t yet approved or cleared by the [place] FDA and  has been authorized for detection and/or diagnosis of SARS-CoV-2 by FDA under an Emergency Use Authorization (EUA). This EUA will remain  in effect (meaning this test can be used) for the duration of the COVID-19 declaration under Section 564(b)(1) of the Act, 21 U.S.C.section 360bbb-3(b)(1), unless the authorization is terminated  or revoked sooner.       Influenza A by PCR NEGATIVE NEGATIVE Final   Influenza B by PCR NEGATIVE NEGATIVE Final    Comment: (NOTE) The Xpert Xpress SARS-CoV-2/FLU/RSV plus assay is intended as an aid in the diagnosis of influenza from Nasopharyngeal swab specimens and should not be used as a sole basis for treatment. Nasal washings and aspirates are unacceptable for Xpert Xpress SARS-CoV-2/FLU/RSV testing.  Fact Sheet for Patients: EntrepreneurPulse.com.au  Fact Sheet for Healthcare Providers: IncredibleEmployment.be  This test is not yet approved or cleared by the [place] FDA and has been authorized for detection and/or diagnosis of SARS-CoV-2 by FDA under an Emergency Use Authorization (EUA). This EUA will remain in effect (meaning this test can be used) for the duration of the COVID-19 declaration under Section 564(b)(1) of the Act, 21 U.S.C. section 360bbb-3(b)(1), unless the authorization is terminated or revoked.     Resp Syncytial Virus by PCR NEGATIVE NEGATIVE Final    Comment: (NOTE) Fact Sheet for  Patients: EntrepreneurPulse.com.au  Fact Sheet for Healthcare Providers: IncredibleEmployment.be  This test is not yet approved or cleared by the [place] FDA and has been authorized for detection and/or diagnosis of SARS-CoV-2 by FDA under an Emergency Use Authorization (EUA). This EUA will remain in effect (meaning this test can be used) for the duration of the COVID-19 declaration under Section 564(b)(1) of the Act, 21 U.S.C. section 360bbb-3(b)(1), unless the authorization is terminated or revoked.  Performed at [facility], [place] [address] [place], [place] [address]   Blood culture (routine x 2)     Status: None   Collection Time: 02/12/22 12:58 PM   Specimen:  BLOOD  Result Value Ref Range Status   Specimen Description BLOOD LEFT ANTECUBITAL  Final   Special Requests   Final    BOTTLES DRAWN AEROBIC AND ANAEROBIC Blood Culture results may not be optimal due to an inadequate volume of blood received in culture bottles   Culture   Final    NO GROWTH 5 DAYS Performed at [place] [address], [place], [place] [address]    Report Status 02/17/2022 FINAL  Final  Blood culture (routine x 2)     Status: None   Collection Time: 02/12/22  7:13 PM   Specimen: BLOOD  Result Value Ref Range Status   Specimen Description BLOOD SITE NOT SPECIFIED  Final   Special Requests   Final    BOTTLES DRAWN AEROBIC ONLY Blood Culture results may not be optimal due to an inadequate volume of blood received in culture bottles   Culture   Final    NO GROWTH 5 DAYS Performed at [facility], [place] [address], [place], [place] [address]    Report Status 02/17/2022 FINAL  Final    RADIOLOGY STUDIES/RESULTS: CT HEAD WO CONTRAST (5MM)  Result Date: 02/18/2022 CLINICAL DATA:  BLURRY VISION EXAM: CT HEAD WITHOUT CONTRAST TECHNIQUE: Contiguous axial images were obtained from the base of the skull through the vertex without intravenous  contrast. RADIATION DOSE REDUCTION: This exam was performed according to the departmental dose-optimization program which includes automated exposure control, adjustment of the mA and/or kV according to patient size and/or use of iterative reconstruction technique. COMPARISON:  CT head 11/27/2021 FINDINGS: Brain: Cerebral ventricle sizes are concordant with the degree of cerebral volume loss. Patchy and confluent areas of decreased attenuation are noted throughout the deep and periventricular white matter of the cerebral hemispheres bilaterally, compatible with chronic microvascular ischemic disease. Chronic right basal ganglia and right cerebellar lacunar infarction. No evidence of large-territorial acute infarction. No parenchymal hemorrhage. No mass lesion. No extra-axial collection. No mass effect or midline shift. No hydrocephalus. Basilar cisterns are patent. Vascular: No hyperdense vessel. Skull: No acute fracture or focal lesion. Sinuses/Orbits: Paranasal sinuses and mastoid air cells are clear. Bilateral lens replacement. Otherwise the orbits are unremarkable. Other: None. IMPRESSION: No acute intracranial abnormality. Electronically Signed   By: Iven Finn M.D.   On: 02/18/2022 21:56     LOS: 6 days   Oren Binet, MD  [facility]    To contact the attending provider between 7A-7P or the covering provider during after hours 7P-7A, please log into the web site [email or web address] and access using universal [place] password for that web site. If you do not have the password, please call the hospital operator.  02/19/2022, 11:16 AM

## 2022-02-19 NOTE — Progress Notes (Signed)
CSW spoke with [facility] regarding PT. [facility] notified CSW that they have PT on site through [facility]. TOC will continue to follow to assist with dc when medically ready.   Beckey Rutter, MSW, LCSWA, LCASA Transitions of Care  Clinical Social Worker I

## 2022-02-19 NOTE — Progress Notes (Signed)
PT Cancellation Note  Patient Details Name: Charlotte Powell MRN: [id number] DOB: [birth year]   Cancelled Treatment:    Reason Eval/Treat Not Completed: Medical issues which prohibited therapy.  Hypotensive and medically limited for tx so will retry at another time.   Ramond Dial 02/19/2022, 12:42 PM  Mee Hives, PT PhD Acute Rehab Dept. Number: [place] [facility] [place]

## 2022-02-20 DIAGNOSIS — I482 Chronic atrial fibrillation, unspecified: Secondary | ICD-10-CM | POA: Diagnosis not present

## 2022-02-20 DIAGNOSIS — I4891 Unspecified atrial fibrillation: Secondary | ICD-10-CM | POA: Diagnosis not present

## 2022-02-20 DIAGNOSIS — I952 Hypotension due to drugs: Secondary | ICD-10-CM | POA: Diagnosis not present

## 2022-02-20 DIAGNOSIS — E785 Hyperlipidemia, unspecified: Secondary | ICD-10-CM | POA: Diagnosis not present

## 2022-02-20 DIAGNOSIS — R627 Adult failure to thrive: Secondary | ICD-10-CM | POA: Diagnosis not present

## 2022-02-20 LAB — BASIC METABOLIC PANEL
Anion gap: 7 (ref 5–15)
BUN: 15 mg/dL (ref 8–23)
CO2: 24 mmol/L (ref 22–32)
Calcium: 8 mg/dL — ABNORMAL LOW (ref 8.9–10.3)
Chloride: 103 mmol/L (ref 98–111)
Creatinine, Ser: 0.93 mg/dL (ref 0.44–1.00)
GFR, Estimated: 58 mL/min — ABNORMAL LOW (ref >60)
Glucose, Bld: 105 mg/dL — ABNORMAL HIGH (ref 70–99)
Potassium: 3.4 mmol/L — ABNORMAL LOW (ref 3.5–5.1)
Sodium: 134 mmol/L — ABNORMAL LOW (ref 135–145)

## 2022-02-20 MED ORDER — POTASSIUM CHLORIDE CRYS ER 20 MEQ PO TBCR
40.0000 meq | EXTENDED_RELEASE_TABLET | ORAL | Status: AC
  Administered 2022-02-20 (×2): 40 meq via ORAL
  Filled 2022-02-20 (×2): qty 2

## 2022-02-20 MED ORDER — DILTIAZEM HCL 30 MG PO TABS
30.0000 mg | ORAL_TABLET | Freq: Four times a day (QID) | ORAL | Status: DC
  Administered 2022-02-20 – 2022-02-22 (×8): 30 mg via ORAL
  Filled 2022-02-20 (×8): qty 1

## 2022-02-20 MED ORDER — APIXABAN 5 MG PO TABS
5.0000 mg | ORAL_TABLET | Freq: Two times a day (BID) | ORAL | Status: DC
  Administered 2022-02-20 – 2022-02-26 (×13): 5 mg via ORAL
  Filled 2022-02-20 (×14): qty 1

## 2022-02-20 NOTE — Progress Notes (Signed)
Initial Nutrition Assessment  DOCUMENTATION CODES:   Not applicable  INTERVENTION:  Liberalize diet from a heart healthy to a regular diet to provide widest variety of menu options to enhance nutritional adequacy Discontinue Ensure supplements Boost Breeze po TID, each supplement provides 250 kcal and 9 grams of protein  NUTRITION DIAGNOSIS:   Inadequate oral intake related to poor appetite as evidenced by per patient/family report, meal completion < 50%.  GOAL:   Patient will meet greater than or equal to 90% of their needs  MONITOR:   PO intake, Supplement acceptance, Diet advancement, Labs, I & O's, Weight trends, Skin  REASON FOR ASSESSMENT:   Malnutrition Screening Tool    ASSESSMENT:   Pt admitted from ALF/ILF with c/o chest pain d/t chronic afib with RVR. PMH significant for afib, CAD, GERD, HTN, HLD, hypothyroidism. Recently admitted 11/29-12/5 for rotavirus gastroenteritis and developed PNA.   Pt sitting comfortably in bed with daughter present at bedside. Pt reports that she has not had much of an appetite recently. During admission, she has been eating, on average, 6 bites of each meal. She was eating slightly more at home but still consisted of small portions.   She does not drink nutrition supplements at home but is agreeable to continue receiving them during admission. Pt finished last sip of Boost Breeze at time of visit. She does not enjoy the Ensure supplements. She is agreeable to discontinuing Ensure and increasing frequency of Boost Breeze.   Meal completions: 1/1: 10% dinner 1/2: 0% breakfast, 50% lunch 1/3: 10% breakfast, 10% lunch, 80% dinner 1/4: 5% breakfast  It is not likely pt is exceeding any nutrient limits at this time with decreased PO intake. She would benefit from a liberalized diet to encourage adequate PO intake given high nutrition risk.   Pt is uncertain of her usual weight or whether she has had recent weight changes. Reviewed weight  history, it appears her weight has increased from 59 kg in October up to 65.7 kg today. This is likely partly r/t lower extremity (mild pitting) edema.   Medications: colace, melatonin, protonix, miralax (not given today)  Labs: sodium 134, potassium 3.4 (repleted)  UOP: 341ml x24 hours I/O's: +531ml since admit    NUTRITION - FOCUSED PHYSICAL EXAM:  Flowsheet Row Most Recent Value  Orbital Region Mild depletion  Upper Arm Region No depletion  Thoracic and Lumbar Region No depletion  Buccal Region No depletion  Temple Region No depletion  Clavicle Bone Region Mild depletion  Clavicle and Acromion Bone Region No depletion  Scapular Bone Region No depletion  Dorsal Hand Mild depletion  Patellar Region Mild depletion  Anterior Thigh Region Mild depletion  Posterior Calf Region Mild depletion  Edema (RD Assessment) None  Hair Reviewed  Eyes Reviewed  Mouth Reviewed  Skin Reviewed  Nails Reviewed       Diet Order:   Diet Order             Diet regular Room service appropriate? Yes; Fluid consistency: Thin  Diet effective now                   EDUCATION NEEDS:   Education needs have been addressed  Skin:  Skin Assessment: Reviewed RN Assessment  Last BM:  1/3  Height:   Ht Readings from Last 1 Encounters:  02/12/22 5\' 3"  (1.6 m)    Weight:   Wt Readings from Last 1 Encounters:  02/20/22 65.7 kg   BMI:  Body mass index  is 25.66 kg/m.  Estimated Nutritional Needs:   Kcal:  1400-1600  Protein:  70-85g  Fluid:  1.4-1.6L  Clayborne Dana, RDN, LDN Clinical Nutrition

## 2022-02-20 NOTE — Progress Notes (Signed)
Occupational Therapy Treatment Patient Details Name: Charlotte Powell MRN: [id number] DOB: 1928/06/03 Today's Date: 02/20/2022   History of present illness Pt is a 89 year old woman who [facility] from [facility] with SOB, nausea and chest pain with afib/RVR. Pt with recent admission for rotovirus and reportedly has not regained her strength since. PMH: afib, CAD, GERD, HTN, HLD, Hypothyroidism, neuropathy, compression fractures.   OT comments  Pt is considerably weaker this visit compared to evaluation. Continues to require min assist for bed mobility, but requires max assist to stand and was unable to take side steps or tolerate transfer to chair. Pt with poor sitting balance with posterior and R side lean. BP 131/93 in supine before activity and 87/65 in supine once returned to supine. Pt stating she is tired. Talked with daughter about increase in weakness and pt may need SNF for ST rehab. Daughter agreed to palliative care consult, Dr. Nigel Powell notified.   Recommendations for follow up therapy are one component of a multi-disciplinary discharge planning process, led by the attending physician.  Recommendations may be updated based on patient status, additional functional criteria and insurance authorization.    Follow Up Recommendations  Home health OT (at ALF, may need to update to SNF)     Assistance Recommended at Discharge Frequent or constant Supervision/Assistance  Patient can return home with the following  A lot of help with walking and/or transfers;A lot of help with bathing/dressing/bathroom;Assistance with cooking/housework;Assist for transportation;Help with stairs or ramp for entrance   Equipment Recommendations  Wheelchair (measurements OT);Wheelchair cushion (measurements OT);Hospital bed    Recommendations for Other Services      Precautions / Restrictions Precautions Precautions: Fall Precaution Comments: watch HR and BP       Mobility Bed  Mobility Overal bed mobility: Needs Assistance Bed Mobility: Supine to Sit, Sit to Supine     Supine to sit: Min assist, HOB elevated Sit to supine: Min assist   General bed mobility comments: assist to raise trunk and for LEs back into bed, increased time    Transfers Overall transfer level: Needs assistance Equipment used: 1 person hand held assist Transfers: Sit to/from Stand Sit to Stand: Max assist           General transfer comment: stood x 1 from EOB, but unable to side step and declined transfer to chair     Balance Overall balance assessment: Needs assistance Sitting-balance support: Bilateral upper extremity supported, Feet supported Sitting balance-Leahy Scale: Poor   Postural control: Posterior lean, Right lateral lean Standing balance support: Bilateral upper extremity supported Standing balance-Leahy Scale: Poor Standing balance comment: stood x 10 seconds                           ADL either performed or assessed with clinical judgement   ADL Overall ADL's : Needs assistance/impaired                     Lower Body Dressing: Total assistance;Bed level                      Extremity/Trunk Assessment              Vision       Perception     Praxis      Cognition Arousal/Alertness: Awake/alert Behavior During Therapy: WFL for tasks assessed/performed Overall Cognitive Status: Within Functional Limits for tasks assessed  Exercises      Shoulder Instructions       General Comments      Pertinent Vitals/ Pain       Pain Assessment Pain Assessment: Faces Faces Pain Scale: No hurt  Home Living                                          Prior Functioning/Environment              Frequency  Min 2X/week        Progress Toward Goals  OT Goals(current goals can now be found in the care plan section)  Progress towards OT  goals: Not progressing toward goals - comment  Acute Rehab OT Goals OT Goal Formulation: With patient Time For Goal Achievement: 02/28/22 Potential to Achieve Goals: [place] Discharge plan remains appropriate    Co-evaluation                 AM-PAC OT "6 Clicks" Daily Activity     Outcome Measure   Help from another person eating meals?: A Little Help from another person taking care of personal grooming?: A Little Help from another person toileting, which includes using toliet, bedpan, or urinal?: Total Help from another person bathing (including washing, rinsing, drying)?: A Lot Help from another person to put on and taking off regular upper body clothing?: A Lot Help from another person to put on and taking off regular lower body clothing?: Total 6 Click Score: 12    End of Session Equipment Utilized During Treatment: Gait belt  OT Visit Diagnosis: Unsteadiness on feet (R26.81);Other abnormalities of gait and mobility (R26.89);Muscle weakness (generalized) (M62.81);Other (comment) (decreased activity tolerance)   Activity Tolerance Patient limited by fatigue   Patient Left in bed;with call bell/phone within reach;with bed alarm set   Nurse Communication Other (comment) (daughter ok with palliative consult)        Time: 1340-1415 OT Time Calculation (min): 35 min  Charges: OT General Charges $OT Visit: 1 Visit OT Treatments $Therapeutic Activity: 23-37 mins  Cleta Alberts, OTR/L Acute Rehabilitation Services Office: [phone]   Malka So 02/20/2022, 4:27 PM

## 2022-02-20 NOTE — Progress Notes (Signed)
Rounding Note    Patient Name: Anwitha Mapes Date of Encounter: 02/20/2022  [place] Cardiologist: Berniece Salines, DO   Subjective   Pt denies CP or dyspnea  Inpatient Medications    Scheduled Meds:  apixaban  2.5 mg Oral BID   benzonatate  200 mg Oral TID   dextromethorphan  30 mg Oral BID   docusate sodium  100 mg Oral BID   feeding supplement  1 Container Oral TID BM   feeding supplement  237 mL Oral BID BM   fluticasone  1 spray Each Nare Daily   gabapentin  300 mg Oral QHS   guaiFENesin  600 mg Oral BID   melatonin  5 mg Oral QHS   pantoprazole (PROTONIX) IV  40 mg Intravenous Q12H   polyethylene glycol  17 g Oral BID   potassium chloride  40 mEq Oral Q4H   rosuvastatin  20 mg Oral Daily   Continuous Infusions:  PRN Meds: acetaminophen **OR** acetaminophen, levalbuterol, menthol-cetylpyridinium, ondansetron **OR** ondansetron (ZOFRAN) IV, prochlorperazine   Vital Signs    Vitals:   02/19/22 1608 02/19/22 1933 02/19/22 2350 02/20/22 0508  BP: (!) 104/56 94/62 121/74 103/66  Pulse: (!) 58 (!) 58 74   Resp: 19 19 17 17   Temp: 97.6 F (36.4 C) 98.4 F (36.9 C) 98.4 F (36.9 C) 97.6 F (36.4 C)  TempSrc: Oral Oral Oral Oral  SpO2: 97% 94% 95% 98%  Weight:    65.7 kg  Height:        Intake/Output Summary (Last 24 hours) at 02/20/2022 0755 Last data filed at 02/20/2022 0510 Gross per 24 hour  Intake 669.61 ml  Output 375 ml  Net 294.61 ml       02/20/2022    5:08 AM 02/19/2022   12:08 AM 02/18/2022   12:05 AM  Last 3 Weights  Weight (lbs) 144 lb 13.5 oz 142 lb 6.7 oz 141 lb 8.6 oz  Weight (kg) 65.7 kg 64.6 kg 64.2 kg      Telemetry    Atrial fibrillation rate elevated this AM - Personally Reviewed  Physical Exam   GEN: NAD, frail Neck: supple, no JVD Cardiac: irregular and tachycardia, no rub Respiratory: CTA; no wheeze GI: Soft, NT/ND, no masses MS: trace edema Neuro: No focal findings Psych: Normal affect   Labs    High  Sensitivity Troponin:   Recent Labs  Lab 02/12/22 1257 02/12/22 1625 02/13/22 0554  TROPONINIHS 49* 47* 42*      Chemistry Recent Labs  Lab 02/14/22 0551 02/15/22 0047 02/16/22 0039 02/19/22 0037 02/20/22 0044  NA  --  133* 130* 134* 134*  K  --  3.2* 4.1 3.3* 3.4*  CL  --  100 100 101 103  CO2  --  25 23 22 24   GLUCOSE  --  80 85 105* 105*  BUN  --  26* 23 14 15   CREATININE  --  0.84 0.90 0.93 0.93  CALCIUM  --  8.2* 8.1* 8.2* 8.0*  MG 1.7 1.9 1.7  --   --   GFRNONAA  --  >60 >60 58* 58*  ANIONGAP  --  8 7 11 7       Hematology Recent Labs  Lab 02/18/22 0104 02/18/22 0723 02/19/22 0037  WBC 6.3 6.3 6.9  RBC 2.25* 3.48* 3.53*  HGB 8.1* 12.3 12.1  HCT 22.9* 36.0 37.1  MCV 101.8* 103.4* 105.1*  MCH 36.0* 35.3* 34.3*  MCHC 35.4 34.2 32.6  RDW  14.0 14.1 14.4  PLT 120* 142* 156       Patient Profile     89 y.o. female with past medical history of coronary artery disease, hypertension, hyperlipidemia, permanent atrial fibrillation admitted with weakness, malaise, decreased p.o. intake, possible pneumonia as well as atrial fibrillation with elevated rate.  Echocardiogram this admission shows ejection fraction 60 to 65%, mild right ventricular enlargement, moderate left atrial enlargement, mild mitral regurgitation, moderate tricuspid regurgitation.  CTA showed no pulmonary embolus and small bilateral pleural effusions.  Assessment & Plan    1 permanent atrial fibrillation-difficult situation.  Heart rate was elevated yesterday and Cardizem CD 120 mg daily was continued and metoprolol increased to 25 mg twice daily.  She then developed hypotension with systolic blood pressure of 75 and heart rate in the 30s.  Discussed at length with patient's daughter yesterday.  Ordinarily would consider pacemaker for tachybradycardia syndrome.  Her daughter would like to avoid that.  Blood pressure has improved this morning and heart rate is in the 140 range.  Will resume Cardizem  30 mg every 6 hours and follow blood pressure and heart rate closely.  Will likely need to accept some degree of tachycardia to avoid significant bradycardia.  Will continue apixaban.  Increase to 5 mg twice daily.  2 history of coronary artery disease-continue Crestor.  No aspirin given need for apixaban.  For questions or updates, please contact [place] Please consult [email or web address] for contact info under        Signed, Kirk Ruths, MD  02/20/2022, 7:55 AM

## 2022-02-20 NOTE — Progress Notes (Signed)
PROGRESS NOTE        PATIENT DETAILS Name: Charlotte Powell Age: 89 y.o. Sex: female Date of Birth: 1928/06/17 Admit Date: 02/12/2022 Admitting Physician Lavina Hamman, MD NIO:[id number], Nelda Bucks, NP  Brief Summary: Patient is a 89 y.o.  female with chronic atrial fibrillation, HTN, HLD, hypothyroidism-shortness of breath-patient was found to have A-fib RVR.  Significant events: 12/27>> admit to [facility]-A-fib RVR 01/03>> brief episode of hypotension-beta-blocker/Cardizem discontinued 01/04>> BP stabilized-remains in RVR-Cardizem started.  Significant studies: 12/28>> CTA chest: No PE, small bilateral pleural effusions. 01/03>> CT head: No acute intracranial abnormality.  Significant microbiology data: 12/27>> COVID/influenza/RSV PCR: Negative 12/27>> blood culture: No growth  Procedures: None  Consults: None  Subjective: No major issues overnight-feels poorly-appetite continues to be poor.  Remains in RVR.  BP has stabilized.  Objective: Vitals: Blood pressure 103/66, pulse 74, temperature 97.6 F (36.4 C), temperature source Oral, resp. rate 17, height 5\' 3"  (1.6 m), weight 65.7 kg, SpO2 98 %.   Exam: Gen Exam:Alert awake-not in any distress HEENT:atraumatic, normocephalic Chest: B/L clear to auscultation anteriorly CVS:S1S2 regular Abdomen:soft non tender, non distended Extremities:no edema Neurology: Non focal Skin: no rash  Pertinent Labs/Radiology:    Latest Ref Rng & Units 02/19/2022   12:37 AM 02/18/2022    7:23 AM 02/18/2022    1:04 AM  CBC  WBC 4.0 - 10.5 K/uL 6.9  6.3  6.3   Hemoglobin 12.0 - 15.0 g/dL 12.1  12.3  8.1   Hematocrit 36.0 - 46.0 % 37.1  36.0  22.9   Platelets 150 - 400 K/uL 156  142  120     Lab Results  Component Value Date   NA 134 (L) 02/20/2022   K 3.4 (L) 02/20/2022   CL 103 02/20/2022   CO2 24 02/20/2022      Assessment/Plan: Chronic Afib with RVR Suspected tachybradycardia syndrome-family not  interested in pursuing PPM. Remains in RVR BP has stabilized overnight Family not interested in pursuing PPM Plan is for gentle rate control medications allowing some amount of permissive RVR. Has been restarted on Cardizem-remains on Eliquis Appreciate cardiology input.  Hypotension Occurred on 1/3-following administration of beta-blocker/Cardizem No evidence of sepsis/volume loss BP has now stabilized-responded to supportive care including gentle IV fluid bolus.    Failure to thrive syndrome Continues to have generalized weakness/poor appetite Unfortunately-do not see any reversible causes at this point Needs to continue to work with PT/OT Prior MD tried Remeron but apparently caused sedation Liberalize diet-encourage use of nutritional supplements.  Abdominal pain Soft belly-benign exam Likely from cough X-ray abdomen negative Supportive care  Peripheral neuropathy Continue Neurontin  Hypothyroidism Resume Synthroid over the next few days.  HLD Continue statin  Hypokalemia Replete/recheck  Hyponatremia Mild-of no clinical significance  BMI: Estimated body mass index is 25.66 kg/m as calculated from the following:   Height as of this encounter: 5\' 3"  (1.6 m).   Weight as of this encounter: 65.7 kg.   Code status:   Code Status: DNR   DVT Prophylaxis: apixaban (ELIQUIS) tablet 5 mg    Family Communication:  Daughter-Sandra Simpkins [phone] updated over the phone 1/4   Disposition Plan: Status is: Inpatient Remains inpatient appropriate because: Severity of illness   Planned Discharge Destination:Home health when medically ready.   Diet: Diet Order  Diet regular Room service appropriate? Yes; Fluid consistency: Thin  Diet effective now                     Antimicrobial agents: Anti-infectives (From admission, onward)    None        MEDICATIONS: Scheduled Meds:  apixaban  5 mg Oral BID   benzonatate  200 mg Oral  TID   dextromethorphan  30 mg Oral BID   diltiazem  30 mg Oral Q6H   docusate sodium  100 mg Oral BID   feeding supplement  1 Container Oral TID BM   fluticasone  1 spray Each Nare Daily   gabapentin  300 mg Oral QHS   guaiFENesin  600 mg Oral BID   melatonin  5 mg Oral QHS   pantoprazole (PROTONIX) IV  40 mg Intravenous Q12H   polyethylene glycol  17 g Oral BID   potassium chloride  40 mEq Oral Q4H   rosuvastatin  20 mg Oral Daily   Continuous Infusions: PRN Meds:.acetaminophen **OR** acetaminophen, levalbuterol, menthol-cetylpyridinium, ondansetron **OR** ondansetron (ZOFRAN) IV, prochlorperazine   I have personally reviewed following labs and imaging studies  LABORATORY DATA: CBC: Recent Labs  Lab 02/15/22 0047 02/16/22 0039 02/18/22 0104 02/18/22 0723 02/19/22 0037  WBC 7.4 7.7 6.3 6.3 6.9  HGB 10.7* 10.5* 8.1* 12.3 12.1  HCT 31.0* 30.2* 22.9* 36.0 37.1  MCV 102.0* 103.1* 101.8* 103.4* 105.1*  PLT 147* 147* 120* 142* 156     Basic Metabolic Panel: Recent Labs  Lab 02/14/22 0130 02/14/22 0551 02/15/22 0047 02/16/22 0039 02/19/22 0037 02/20/22 0044  NA 135  --  133* 130* 134* 134*  K 4.4  --  3.2* 4.1 3.3* 3.4*  CL 100  --  100 100 101 103  CO2 20*  --  25 23 22 24   GLUCOSE 92  --  80 85 105* 105*  BUN 28*  --  26* 23 14 15   CREATININE 1.01*  --  0.84 0.90 0.93 0.93  CALCIUM 8.6*  --  8.2* 8.1* 8.2* 8.0*  MG  --  1.7 1.9 1.7  --   --      GFR: Estimated Creatinine Clearance: 35.9 mL/min (by C-G formula based on SCr of 0.93 mg/dL).  Liver Function Tests: No results for input(s): "AST", "ALT", "ALKPHOS", "BILITOT", "PROT", "ALBUMIN" in the last 168 hours.  Recent Labs  Lab 02/14/22 0551  LIPASE 89*    No results for input(s): "AMMONIA" in the last 168 hours.  Coagulation Profile: No results for input(s): "INR", "PROTIME" in the last 168 hours.  Cardiac Enzymes: No results for input(s): "CKTOTAL", "CKMB", "CKMBINDEX", "TROPONINI" in the last  168 hours.  BNP (last 3 results) No results for input(s): "PROBNP" in the last 8760 hours.  Lipid Profile: No results for input(s): "CHOL", "HDL", "LDLCALC", "TRIG", "CHOLHDL", "LDLDIRECT" in the last 72 hours.  Thyroid Function Tests: No results for input(s): "TSH", "T4TOTAL", "FREET4", "T3FREE", "THYROIDAB" in the last 72 hours.  Anemia Panel: No results for input(s): "VITAMINB12", "FOLATE", "FERRITIN", "TIBC", "IRON", "RETICCTPCT" in the last 72 hours.  Urine analysis:    Component Value Date/Time   COLORURINE AMBER (A) 02/12/2022 1523   APPEARANCEUR HAZY (A) 02/12/2022 1523   LABSPEC 1.030 02/12/2022 1523   PHURINE 5.0 02/12/2022 1523   GLUCOSEU NEGATIVE 02/12/2022 1523   HGBUR NEGATIVE 02/12/2022 1523   BILIRUBINUR NEGATIVE 02/12/2022 1523   KETONESUR NEGATIVE 02/12/2022 1523   PROTEINUR 100 (A) 02/12/2022 1523   NITRITE  NEGATIVE 02/12/2022 1523   LEUKOCYTESUR NEGATIVE 02/12/2022 1523    Sepsis Labs: Lactic Acid, Venous    Component Value Date/Time   LATICACIDVEN 1.3 02/13/2022 0222    MICROBIOLOGY: Recent Results (from the past 240 hour(s))  Resp panel by RT-PCR (RSV, Flu A&B, Covid) Anterior Nasal Swab     Status: None   Collection Time: 02/12/22 12:57 PM   Specimen: Anterior Nasal Swab  Result Value Ref Range Status   SARS Coronavirus 2 by RT PCR NEGATIVE NEGATIVE Final    Comment: (NOTE) SARS-CoV-2 target nucleic acids are NOT DETECTED.  The SARS-CoV-2 RNA is generally detectable in upper respiratory specimens during the acute phase of infection. The lowest concentration of SARS-CoV-2 viral copies this assay can detect is 138 copies/mL. A negative result does not preclude SARS-Cov-2 infection and should not be used as the sole basis for treatment or other patient management decisions. A negative result may occur with  improper specimen collection/handling, submission of specimen other than nasopharyngeal swab, presence of viral mutation(s) within  the areas targeted by this assay, and inadequate number of viral copies(<138 copies/mL). A negative result must be combined with clinical observations, patient history, and epidemiological information. The expected result is Negative.  Fact Sheet for Patients:  EntrepreneurPulse.com.au  Fact Sheet for Healthcare Providers:  IncredibleEmployment.be  This test is no t yet approved or cleared by the [place] FDA and  has been authorized for detection and/or diagnosis of SARS-CoV-2 by FDA under an Emergency Use Authorization (EUA). This EUA will remain  in effect (meaning this test can be used) for the duration of the COVID-19 declaration under Section 564(b)(1) of the Act, 21 U.S.C.section 360bbb-3(b)(1), unless the authorization is terminated  or revoked sooner.       Influenza A by PCR NEGATIVE NEGATIVE Final   Influenza B by PCR NEGATIVE NEGATIVE Final    Comment: (NOTE) The Xpert Xpress SARS-CoV-2/FLU/RSV plus assay is intended as an aid in the diagnosis of influenza from Nasopharyngeal swab specimens and should not be used as a sole basis for treatment. Nasal washings and aspirates are unacceptable for Xpert Xpress SARS-CoV-2/FLU/RSV testing.  Fact Sheet for Patients: EntrepreneurPulse.com.au  Fact Sheet for Healthcare Providers: IncredibleEmployment.be  This test is not yet approved or cleared by the [place] FDA and has been authorized for detection and/or diagnosis of SARS-CoV-2 by FDA under an Emergency Use Authorization (EUA). This EUA will remain in effect (meaning this test can be used) for the duration of the COVID-19 declaration under Section 564(b)(1) of the Act, 21 U.S.C. section 360bbb-3(b)(1), unless the authorization is terminated or revoked.     Resp Syncytial Virus by PCR NEGATIVE NEGATIVE Final    Comment: (NOTE) Fact Sheet for  Patients: EntrepreneurPulse.com.au  Fact Sheet for Healthcare Providers: IncredibleEmployment.be  This test is not yet approved or cleared by the [place] FDA and has been authorized for detection and/or diagnosis of SARS-CoV-2 by FDA under an Emergency Use Authorization (EUA). This EUA will remain in effect (meaning this test can be used) for the duration of the COVID-19 declaration under Section 564(b)(1) of the Act, 21 U.S.C. section 360bbb-3(b)(1), unless the authorization is terminated or revoked.  Performed at [facility], [place] [address] [place], [place] [address]   Blood culture (routine x 2)     Status: None   Collection Time: 02/12/22 12:58 PM   Specimen: BLOOD  Result Value Ref Range Status   Specimen Description BLOOD LEFT ANTECUBITAL  Final  Special Requests   Final    BOTTLES DRAWN AEROBIC AND ANAEROBIC Blood Culture results may not be optimal due to an inadequate volume of blood received in culture bottles   Culture   Final    NO GROWTH 5 DAYS Performed at [facility], [address] [place], [place] [address]    Report Status 02/17/2022 FINAL  Final  Blood culture (routine x 2)     Status: None   Collection Time: 02/12/22  7:13 PM   Specimen: BLOOD  Result Value Ref Range Status   Specimen Description BLOOD SITE NOT SPECIFIED  Final   Special Requests   Final    BOTTLES DRAWN AEROBIC ONLY Blood Culture results may not be optimal due to an inadequate volume of blood received in culture bottles   Culture   Final    NO GROWTH 5 DAYS Performed at [facility], [address], [place], [place] [address]    Report Status 02/17/2022 FINAL  Final    RADIOLOGY STUDIES/RESULTS: CT HEAD WO CONTRAST ( )  Result Date: 02/18/2022 CLINICAL DATA:  BLURRY VISION EXAM: CT HEAD WITHOUT CONTRAST TECHNIQUE: Contiguous axial images were obtained from the base of the skull through the vertex without intravenous  contrast. RADIATION DOSE REDUCTION: This exam was performed according to the departmental dose-optimization program which includes automated exposure control, adjustment of the mA and/or kV according to patient size and/or use of iterative reconstruction technique. COMPARISON:  CT head 11/27/2021 FINDINGS: Brain: Cerebral ventricle sizes are concordant with the degree of cerebral volume loss. Patchy and confluent areas of decreased attenuation are noted throughout the deep and periventricular white matter of the cerebral hemispheres bilaterally, compatible with chronic microvascular ischemic disease. Chronic right basal ganglia and right cerebellar lacunar infarction. No evidence of large-territorial acute infarction. No parenchymal hemorrhage. No mass lesion. No extra-axial collection. No mass effect or midline shift. No hydrocephalus. Basilar cisterns are patent. Vascular: No hyperdense vessel. Skull: No acute fracture or focal lesion. Sinuses/Orbits: Paranasal sinuses and mastoid air cells are clear. Bilateral lens replacement. Otherwise the orbits are unremarkable. Other: None. IMPRESSION: No acute intracranial abnormality. Electronically Signed   By: Tish Frederickson M.D.   On: 02/18/2022 21:56     LOS: 7 days   Jeoffrey Massed, MD  [facility]    To contact the attending provider between 7A-7P or the covering provider during after hours 7P-7A, please log into the web site [email or web address] and access using universal [place] password for that web site. If you do not have the password, please call the hospital operator.  02/20/2022, 11:19 AM

## 2022-02-21 DIAGNOSIS — E785 Hyperlipidemia, unspecified: Secondary | ICD-10-CM | POA: Diagnosis not present

## 2022-02-21 DIAGNOSIS — E039 Hypothyroidism, unspecified: Secondary | ICD-10-CM | POA: Diagnosis not present

## 2022-02-21 DIAGNOSIS — I482 Chronic atrial fibrillation, unspecified: Secondary | ICD-10-CM | POA: Diagnosis not present

## 2022-02-21 DIAGNOSIS — R627 Adult failure to thrive: Secondary | ICD-10-CM | POA: Diagnosis not present

## 2022-02-21 LAB — BASIC METABOLIC PANEL
Anion gap: 6 (ref 5–15)
BUN: 10 mg/dL (ref 8–23)
CO2: 23 mmol/L (ref 22–32)
Calcium: 8.2 mg/dL — ABNORMAL LOW (ref 8.9–10.3)
Chloride: 105 mmol/L (ref 98–111)
Creatinine, Ser: 0.84 mg/dL (ref 0.44–1.00)
GFR, Estimated: >60 mL/min (ref >60)
Glucose, Bld: 132 mg/dL — ABNORMAL HIGH (ref 70–99)
Potassium: 4.5 mmol/L (ref 3.5–5.1)
Sodium: 134 mmol/L — ABNORMAL LOW (ref 135–145)

## 2022-02-21 LAB — MAGNESIUM: Magnesium: 1.6 mg/dL — ABNORMAL LOW (ref 1.7–2.4)

## 2022-02-21 MED ORDER — MAGNESIUM SULFATE 4 GM/100ML IV SOLN
4.0000 g | Freq: Once | INTRAVENOUS | Status: DC
  Filled 2022-02-21: qty 100

## 2022-02-21 MED ORDER — MAGNESIUM SULFATE 2 GM/50ML IV SOLN
2.0000 g | Freq: Once | INTRAVENOUS | Status: AC
  Administered 2022-02-21: 2 g via INTRAVENOUS
  Filled 2022-02-21: qty 50

## 2022-02-21 NOTE — Plan of Care (Signed)
°  Problem: Clinical Measurements: °Goal: Will remain free from infection °Outcome: Completed/Met °  °

## 2022-02-21 NOTE — Progress Notes (Signed)
Physical Therapy Treatment Patient Details Name: Charlotte Powell MRN: [id number] DOB: September 27, 1928 Today's Date: 02/21/2022   History of Present Illness Pt is a 89 year old woman who presented to [facility] on 02/12/22 from [place] with SOB, nausea and chest pain with afib/RVR. Pt with recent admission for rotovirus and reportedly has not regained her strength since. PMH: afib, CAD, GERD, HTN, HLD, Hypothyroidism, neuropathy, compression fractures.    PT Comments    Pt reporting being too fatigued after having a bed bath x1 hour prior, thereby declining bed mobility or OOB mobility today. Pt agreeable to therapeutic exercises in bed though. Family member present taking notes on which exercises to perform over the weekend. Will continue to follow acutely. If ALF can provide the level of assistance pt needs then recommend return to ALF with HHPT. If not, then may need to update recs to SNF.     Recommendations for follow up therapy are one component of a multi-disciplinary discharge planning process, led by the attending physician.  Recommendations may be updated based on patient status, additional functional criteria and insurance authorization.  Follow Up Recommendations  Home health PT (increased assist at ALF; may need to update to SNF)     Assistance Recommended at Discharge Intermittent Supervision/Assistance  Patient can return home with the following A little help with bathing/dressing/bathroom;A lot of help with walking and/or transfers;Assistance with cooking/housework;Assist for transportation   Equipment Recommendations  None recommended by PT    Recommendations for Other Services       Precautions / Restrictions Precautions Precautions: Fall Precaution Comments: watch HR and BP Restrictions Weight Bearing Restrictions: No     Mobility  Bed Mobility               General bed mobility comments: Pt declined bed mobility, but agreeable to focus session on therapeutic  exercises supine in bed.    Transfers                   General transfer comment: Pt declined OOB mobility, but agreeable to focus session on therapeutic exercises supine in bed.    Ambulation/Gait               General Gait Details: Pt declined OOB mobility, but agreeable to focus session on therapeutic exercises supine in bed.   Stairs             Wheelchair Mobility    Modified Rankin (Stroke Patients Only)       Balance                                            Cognition Arousal/Alertness: Awake/alert Behavior During Therapy: WFL for tasks assessed/performed Overall Cognitive Status: Within Functional Limits for tasks assessed                                          Exercises General Exercises - Upper Extremity Shoulder Flexion: AROM, AAROM, Both, 10 reps, Supine, Strengthening (AAROM on L to shoulder height) Elbow Flexion: AROM, Both, 10 reps, Supine, Strengthening Elbow Extension: AROM, Both, 10 reps, Supine, Strengthening Digit Composite Flexion: AROM, Both, 10 reps, Supine Composite Extension: AROM, Both, 10 reps, Supine General Exercises - Lower Extremity Ankle Circles/Pumps: AROM, Both, 10 reps, Supine Short Arc  Quad: AROM, Both, 10 reps, Supine, Strengthening Heel Slides: AROM, Strengthening, Both, 5 reps, Supine Hip ABduction/ADduction: AROM, Strengthening, Both, 10 reps, Supine Straight Leg Raises: AAROM, Strengthening, Both, 10 reps, Supine Other Exercises Other Exercises: shoulder rolls 10x supine in bed Other Exercises: IS x5 reps    General Comments General comments (skin integrity, edema, etc.): HR up to 140s then would quickly reduce to 120s again      Pertinent Vitals/Pain Pain Assessment Pain Assessment: Faces Faces Pain Scale: Hurts a little bit Pain Location: generalized with exercises, L shoulder chronic pain Pain Descriptors / Indicators: Grimacing, Guarding, Discomfort Pain  Intervention(s): Limited activity within patient's tolerance, Monitored during session, Repositioned    Home Living                          Prior Function            PT Goals (current goals can now be found in the care plan section) Acute Rehab PT Goals Patient Stated Goal: to get better PT Goal Formulation: With patient/family Time For Goal Achievement: 02/28/22 Potential to Achieve Goals: Good Progress towards PT goals: Progressing toward goals    Frequency    Min 3X/week      PT Plan Current plan remains appropriate    Co-evaluation              AM-PAC PT "6 Clicks" Mobility   Outcome Measure  Help needed turning from your back to your side while in a flat bed without using bedrails?: A Little Help needed moving from lying on your back to sitting on the side of a flat bed without using bedrails?: A Little Help needed moving to and from a bed to a chair (including a wheelchair)?: A Lot Help needed standing up from a chair using your arms (e.g., wheelchair or bedside chair)?: A Lot Help needed to walk in hospital room?: Total Help needed climbing 3-5 steps with a railing? : Total 6 Click Score: 12    End of Session   Activity Tolerance: Patient limited by fatigue Patient left: in bed;with call bell/phone within reach;with bed alarm set;with family/visitor present   PT Visit Diagnosis: Other abnormalities of gait and mobility (R26.89);Muscle weakness (generalized) (M62.81)     Time: 8119-1478 PT Time Calculation (min) (ACUTE ONLY): 17 min  Charges:  $Therapeutic Exercise: 8-22 mins                     Moishe Spice, PT, DPT Acute Rehabilitation Services  Office: [phone]    Charlotte Powell 02/21/2022, 6:26 PM

## 2022-02-21 NOTE — Progress Notes (Signed)
PROGRESS NOTE        PATIENT DETAILS Name: Charlotte Powell Age: 89 y.o. Sex: female Date of Birth: 01/20/1929 Admit Date: 02/12/2022 Admitting Physician Rolly Salter, MD OAC:[id number], Donalee Citrin, NP  Brief Summary: Patient is a 89 y.o.  female with chronic atrial fibrillation, HTN, HLD, hypothyroidism-shortness of breath-patient was found to have A-fib RVR.  Significant events: 12/27>> admit to [facility]-A-fib RVR 01/03>> brief episode of hypotension-beta-blocker/Cardizem discontinued 01/04>> BP stabilized-remains in RVR-Cardizem started.  Significant studies: 12/28>> CTA chest: No PE, small bilateral pleural effusions. 01/03>> CT head: No acute intracranial abnormality.  Significant microbiology data: 12/27>> COVID/influenza/RSV PCR: Negative 12/27>> blood culture: No growth  Procedures: None  Consults: None  Subjective: No family at bedside but claims that she ate some food yesterday.  No major issues overnight.  Objective: Vitals: Blood pressure 103/73, pulse 62, temperature 97.7 F (36.5 C), temperature source Oral, resp. rate 17, height 5\' 3"  (1.6 m), weight 64.9 kg, SpO2 95 %.   Exam: Gen Exam:Alert awake-not in any distress HEENT:atraumatic, normocephalic Chest: B/L clear to auscultation anteriorly CVS:S1S2 regular Abdomen:soft non tender, non distended Extremities:no edema Neurology: Non focal Skin: no rash  Pertinent Labs/Radiology:    Latest Ref Rng & Units 02/19/2022   12:37 AM 02/18/2022    7:23 AM 02/18/2022    1:04 AM  CBC  WBC 4.0 - 10.5 K/uL 6.9  6.3  6.3   Hemoglobin 12.0 - 15.0 g/dL 04/19/2022  16.0  8.1   Hematocrit 36.0 - 46.0 % 37.1  36.0  22.9   Platelets 150 - 400 K/uL 156  142  120     Lab Results  Component Value Date   NA 134 (L) 02/21/2022   K 4.5 02/21/2022   CL 105 02/21/2022   CO2 23 02/21/2022      Assessment/Plan: Chronic Afib with RVR Suspected tachybradycardia syndrome Rate better controlled Cardiology  slowly increasing dosage of Cardizem She likely has underlying tachybradycardia syndrome-and has gotten bradycardic/hypotensive with aggressive rate control strategy. Family not interested in pursuing PPM Continue Eliquis Appreciate cardiology input.  Hypotension Occurred on 1/3-following administration of beta-blocker/Cardizem No evidence of sepsis/volume loss BP has now stabilized-responded to supportive care including gentle IV fluid bolus.    Failure to thrive syndrome Continues to have generalized weakness/poor appetite Unfortunately-do not see any reversible causes at this point Needs to continue to work with PT/OT Prior MD tried Remeron but apparently caused sedation Liberalize diet-encourage use of nutritional supplements.  Abdominal pain Soft belly-benign exam Likely from cough X-ray abdomen negative Supportive care  Peripheral neuropathy Continue Neurontin  Hypothyroidism Resume Synthroid over the next few days.  HLD Continue statin  Hypokalemia Repleted  Hypomagnesemia Continue supplementation  Hyponatremia Mild-of no clinical significance  Palliative care DNR in place Continues to have failure to thrive syndrome-continues to be quite challenging for rate control of A-fib given underlying tachybradycardia syndrome.  Family does not desire PPM placement. Palliative care consulted for further goals of care discussion.  BMI: Estimated body mass index is 25.35 kg/m as calculated from the following:   Height as of this encounter: 5\' 3"  (1.6 m).   Weight as of this encounter: 64.9 kg.   Code status:   Code Status: DNR   DVT Prophylaxis: apixaban (ELIQUIS) tablet 5 mg    Family Communication:  Daughter-Sandra Simpkins [phone] updated over the phone 1/5  Disposition Plan: Status is: Inpatient Remains inpatient appropriate because: Severity of illness   Planned Discharge Destination: ALF with home health versus SNF-TOC team consulted on 1/5  to engage family.   Diet: Diet Order             Diet regular Room service appropriate? Yes; Fluid consistency: Thin  Diet effective now                     Antimicrobial agents: Anti-infectives (From admission, onward)    None        MEDICATIONS: Scheduled Meds:  apixaban  5 mg Oral BID   benzonatate  200 mg Oral TID   dextromethorphan  30 mg Oral BID   diltiazem  30 mg Oral Q6H   docusate sodium  100 mg Oral BID   feeding supplement  1 Container Oral TID BM   fluticasone  1 spray Each Nare Daily   gabapentin  300 mg Oral QHS   guaiFENesin  600 mg Oral BID   melatonin  5 mg Oral QHS   pantoprazole (PROTONIX) IV  40 mg Intravenous Q12H   polyethylene glycol  17 g Oral BID   rosuvastatin  20 mg Oral Daily   Continuous Infusions: PRN Meds:.acetaminophen **OR** acetaminophen, levalbuterol, menthol-cetylpyridinium, ondansetron **OR** ondansetron (ZOFRAN) IV, prochlorperazine   I have personally reviewed following labs and imaging studies  LABORATORY DATA: CBC: Recent Labs  Lab 02/15/22 0047 02/16/22 0039 02/18/22 0104 02/18/22 0723 02/19/22 0037  WBC 7.4 7.7 6.3 6.3 6.9  HGB 10.7* 10.5* 8.1* 12.3 12.1  HCT 31.0* 30.2* 22.9* 36.0 37.1  MCV 102.0* 103.1* 101.8* 103.4* 105.1*  PLT 147* 147* 120* 142* 156     Basic Metabolic Panel: Recent Labs  Lab 02/15/22 0047 02/16/22 0039 02/19/22 0037 02/20/22 0044 02/21/22 0044  NA 133* 130* 134* 134* 134*  K 3.2* 4.1 3.3* 3.4* 4.5  CL 100 100 101 103 105  CO2 25 23 22 24 23   GLUCOSE 80 85 105* 105* 132*  BUN 26* 23 14 15 10   CREATININE 0.84 0.90 0.93 0.93 0.84  CALCIUM 8.2* 8.1* 8.2* 8.0* 8.2*  MG 1.9 1.7  --   --  1.6*     GFR: Estimated Creatinine Clearance: 39.5 mL/min (by C-G formula based on SCr of 0.84 mg/dL).  Liver Function Tests: No results for input(s): "AST", "ALT", "ALKPHOS", "BILITOT", "PROT", "ALBUMIN" in the last 168 hours.  No results for input(s): "LIPASE", "AMYLASE" in the  last 168 hours.  No results for input(s): "AMMONIA" in the last 168 hours.  Coagulation Profile: No results for input(s): "INR", "PROTIME" in the last 168 hours.  Cardiac Enzymes: No results for input(s): "CKTOTAL", "CKMB", "CKMBINDEX", "TROPONINI" in the last 168 hours.  BNP (last 3 results) No results for input(s): "PROBNP" in the last 8760 hours.  Lipid Profile: No results for input(s): "CHOL", "HDL", "LDLCALC", "TRIG", "CHOLHDL", "LDLDIRECT" in the last 72 hours.  Thyroid Function Tests: No results for input(s): "TSH", "T4TOTAL", "FREET4", "T3FREE", "THYROIDAB" in the last 72 hours.  Anemia Panel: No results for input(s): "VITAMINB12", "FOLATE", "FERRITIN", "TIBC", "IRON", "RETICCTPCT" in the last 72 hours.  Urine analysis:    Component Value Date/Time   COLORURINE AMBER (A) 02/12/2022 1523   APPEARANCEUR HAZY (A) 02/12/2022 1523   LABSPEC 1.030 02/12/2022 1523   PHURINE 5.0 02/12/2022 1523   GLUCOSEU NEGATIVE 02/12/2022 1523   HGBUR NEGATIVE 02/12/2022 1523   BILIRUBINUR NEGATIVE 02/12/2022 1523   KETONESUR NEGATIVE 02/12/2022 1523  PROTEINUR 100 (A) 02/12/2022 1523   NITRITE NEGATIVE 02/12/2022 1523   LEUKOCYTESUR NEGATIVE 02/12/2022 1523    Sepsis Labs: Lactic Acid, Venous    Component Value Date/Time   LATICACIDVEN 1.3 02/13/2022 0222    MICROBIOLOGY: Recent Results (from the past 240 hour(s))  Resp panel by RT-PCR (RSV, Flu A&B, Covid) Anterior Nasal Swab     Status: None   Collection Time: 02/12/22 12:57 PM   Specimen: Anterior Nasal Swab  Result Value Ref Range Status   SARS Coronavirus 2 by RT PCR NEGATIVE NEGATIVE Final    Comment: (NOTE) SARS-CoV-2 target nucleic acids are NOT DETECTED.  The SARS-CoV-2 RNA is generally detectable in upper respiratory specimens during the acute phase of infection. The lowest concentration of SARS-CoV-2 viral copies this assay can detect is 138 copies/mL. A negative result does not preclude SARS-Cov-2 infection  and should not be used as the sole basis for treatment or other patient management decisions. A negative result may occur with  improper specimen collection/handling, submission of specimen other than nasopharyngeal swab, presence of viral mutation(s) within the areas targeted by this assay, and inadequate number of viral copies(<138 copies/mL). A negative result must be combined with clinical observations, patient history, and epidemiological information. The expected result is Negative.  Fact Sheet for Patients:  EntrepreneurPulse.com.au  Fact Sheet for Healthcare Providers:  IncredibleEmployment.be  This test is no t yet approved or cleared by the [place] FDA and  has been authorized for detection and/or diagnosis of SARS-CoV-2 by FDA under an Emergency Use Authorization (EUA). This EUA will remain  in effect (meaning this test can be used) for the duration of the COVID-19 declaration under Section 564(b)(1) of the Act, 21 U.S.C.section 360bbb-3(b)(1), unless the authorization is terminated  or revoked sooner.       Influenza A by PCR NEGATIVE NEGATIVE Final   Influenza B by PCR NEGATIVE NEGATIVE Final    Comment: (NOTE) The Xpert Xpress SARS-CoV-2/FLU/RSV plus assay is intended as an aid in the diagnosis of influenza from Nasopharyngeal swab specimens and should not be used as a sole basis for treatment. Nasal washings and aspirates are unacceptable for Xpert Xpress SARS-CoV-2/FLU/RSV testing.  Fact Sheet for Patients: EntrepreneurPulse.com.au  Fact Sheet for Healthcare Providers: IncredibleEmployment.be  This test is not yet approved or cleared by the [place] FDA and has been authorized for detection and/or diagnosis of SARS-CoV-2 by FDA under an Emergency Use Authorization (EUA). This EUA will remain in effect (meaning this test can be used) for the duration of the COVID-19 declaration  under Section 564(b)(1) of the Act, 21 U.S.C. section 360bbb-3(b)(1), unless the authorization is terminated or revoked.     Resp Syncytial Virus by PCR NEGATIVE NEGATIVE Final    Comment: (NOTE) Fact Sheet for Patients: EntrepreneurPulse.com.au  Fact Sheet for Healthcare Providers: IncredibleEmployment.be  This test is not yet approved or cleared by the [place] FDA and has been authorized for detection and/or diagnosis of SARS-CoV-2 by FDA under an Emergency Use Authorization (EUA). This EUA will remain in effect (meaning this test can be used) for the duration of the COVID-19 declaration under Section 564(b)(1) of the Act, 21 U.S.C. section 360bbb-3(b)(1), unless the authorization is terminated or revoked.  Performed at [facility], [place] [address] [place], [place] [address]   Blood culture (routine x 2)     Status: None   Collection Time: 02/12/22 12:58 PM   Specimen: BLOOD  Result Value Ref Range Status   Specimen  Description BLOOD LEFT ANTECUBITAL  Final   Special Requests   Final    BOTTLES DRAWN AEROBIC AND ANAEROBIC Blood Culture results may not be optimal due to an inadequate volume of blood received in culture bottles   Culture   Final    NO GROWTH 5 DAYS Performed at [facility], [address] [place], [place] [address]    Report Status 02/17/2022 FINAL  Final  Blood culture (routine x 2)     Status: None   Collection Time: 02/12/22  7:13 PM   Specimen: BLOOD  Result Value Ref Range Status   Specimen Description BLOOD SITE NOT SPECIFIED  Final   Special Requests   Final    BOTTLES DRAWN AEROBIC ONLY Blood Culture results may not be optimal due to an inadequate volume of blood received in culture bottles   Culture   Final    NO GROWTH 5 DAYS Performed at [facility], [address] [place], [place] [address]    Report Status 02/17/2022 FINAL  Final    RADIOLOGY STUDIES/RESULTS: No results  found.   LOS: 8 days   Jeoffrey Massed, MD  [facility]    To contact the attending provider between 7A-7P or the covering provider during after hours 7P-7A, please log into the web site [email or web address] and access using universal [place] password for that web site. If you do not have the password, please call the hospital operator.  02/21/2022, 11:03 AM

## 2022-02-21 NOTE — Progress Notes (Signed)
Pt's heart rate stays between 120-160 this morning for a while even after giving cardizem at 5:49 am.  At around 10:00 am, pt's heart rate went down to high 50-low 60. BP also went down to 103/73. Starting 12:00, pt's heart rate started tach up to 120-130's. Gave cardizem at 12:16 pm.  Pt's heart rate started running from 120-165, BP118/86  Pt was asymptomatic. Notified cardiology.

## 2022-02-21 NOTE — Progress Notes (Signed)
Rounding Note    Patient Name: Charlotte Powell Date of Encounter: 02/21/2022  [place] Cardiologist: Berniece Salines, DO   Subjective   No CP or dyspnea  Inpatient Medications    Scheduled Meds:  apixaban  5 mg Oral BID   benzonatate  200 mg Oral TID   dextromethorphan  30 mg Oral BID   diltiazem  30 mg Oral Q6H   docusate sodium  100 mg Oral BID   feeding supplement  1 Container Oral TID BM   fluticasone  1 spray Each Nare Daily   gabapentin  300 mg Oral QHS   guaiFENesin  600 mg Oral BID   melatonin  5 mg Oral QHS   pantoprazole (PROTONIX) IV  40 mg Intravenous Q12H   polyethylene glycol  17 g Oral BID   rosuvastatin  20 mg Oral Daily   Continuous Infusions:  PRN Meds: acetaminophen **OR** acetaminophen, levalbuterol, menthol-cetylpyridinium, ondansetron **OR** ondansetron (ZOFRAN) IV, prochlorperazine   Vital Signs    Vitals:   02/20/22 2307 02/20/22 2358 02/21/22 0530 02/21/22 0820  BP: 115/64 113/66 (!) 123/59 112/67  Pulse: 71 68 88 (!) 115  Resp: 18 (!) 23 20 16   Temp: 98.6 F (37 C) 98.3 F (36.8 C) 98.4 F (36.9 C) 97.7 F (36.5 C)  TempSrc: Oral Oral Oral Oral  SpO2: 96% 96% 94% 94%  Weight:   64.9 kg   Height:        Intake/Output Summary (Last 24 hours) at 02/21/2022 0903 Last data filed at 02/20/2022 2300 Gross per 24 hour  Intake 340 ml  Output 150 ml  Net 190 ml       02/21/2022    5:30 AM 02/20/2022    5:08 AM 02/19/2022   12:08 AM  Last 3 Weights  Weight (lbs) 143 lb 1.3 oz 144 lb 13.5 oz 142 lb 6.7 oz  Weight (kg) 64.9 kg 65.7 kg 64.6 kg      Telemetry    Atrial fibrillation rate improved - Personally Reviewed  Physical Exam   GEN: NAD Neck: supple Cardiac: irregular Respiratory: CTA GI: Soft, NT/ND MS: trace edema Neuro: Grossly intact Psych: Normal affect   Labs    High Sensitivity Troponin:   Recent Labs  Lab 02/12/22 1257 02/12/22 1625 02/13/22 0554  TROPONINIHS 49* 47* 42*      Chemistry Recent  Labs  Lab 02/15/22 0047 02/16/22 0039 02/19/22 0037 02/20/22 0044 02/21/22 0044  NA 133* 130* 134* 134* 134*  K 3.2* 4.1 3.3* 3.4* 4.5  CL 100 100 101 103 105  CO2 25 23 22 24 23   GLUCOSE 80 85 105* 105* 132*  BUN 26* 23 14 15 10   CREATININE 0.84 0.90 0.93 0.93 0.84  CALCIUM 8.2* 8.1* 8.2* 8.0* 8.2*  MG 1.9 1.7  --   --  1.6*  GFRNONAA >60 >60 58* 58* >60  ANIONGAP 8 7 11 7 6       Hematology Recent Labs  Lab 02/18/22 0104 02/18/22 0723 02/19/22 0037  WBC 6.3 6.3 6.9  RBC 2.25* 3.48* 3.53*  HGB 8.1* 12.3 12.1  HCT 22.9* 36.0 37.1  MCV 101.8* 103.4* 105.1*  MCH 36.0* 35.3* 34.3*  MCHC 35.4 34.2 32.6  RDW 14.0 14.1 14.4  PLT 120* 142* 156       Patient Profile     89 y.o. female with past medical history of coronary artery disease, hypertension, hyperlipidemia, permanent atrial fibrillation admitted with weakness, malaise, decreased p.o. intake, possible pneumonia  as well as atrial fibrillation with elevated rate.  Echocardiogram this admission shows ejection fraction 60 to 65%, mild right ventricular enlargement, moderate left atrial enlargement, mild mitral regurgitation, moderate tricuspid regurgitation.  CTA showed no pulmonary embolus and small bilateral pleural effusions.  Assessment & Plan    1 permanent atrial fibrillation-patient's heart rate is reasonable at present.  As outlined previously on Cardizem CD 120 mg daily and metoprolol 25 mg twice daily she developed significant hypotension with systolic blood pressure of 75 and heart rate in the 30s.  We therefore will plan to allow her heart rate to run higher as her daughter would like to avoid pacemaker.  She was on Cardizem 30 mg every 8 hours prior to admission.  Will continue Cardizem 30 mg every 6 hours today and transition to Cardizem CD 120 mg daily tomorrow morning if blood pressure and heart rate stable.   2 history of coronary artery disease-continue Crestor.  No aspirin given need for  apixaban.  For questions or updates, please contact [place] Please consult [email or web address] for contact info under        Signed, Kirk Ruths, MD  02/21/2022, 9:03 AM

## 2022-02-22 DIAGNOSIS — I482 Chronic atrial fibrillation, unspecified: Secondary | ICD-10-CM | POA: Diagnosis not present

## 2022-02-22 DIAGNOSIS — E039 Hypothyroidism, unspecified: Secondary | ICD-10-CM | POA: Diagnosis not present

## 2022-02-22 DIAGNOSIS — Z7189 Other specified counseling: Secondary | ICD-10-CM | POA: Diagnosis not present

## 2022-02-22 DIAGNOSIS — Z515 Encounter for palliative care: Secondary | ICD-10-CM

## 2022-02-22 DIAGNOSIS — I2510 Atherosclerotic heart disease of native coronary artery without angina pectoris: Secondary | ICD-10-CM | POA: Diagnosis not present

## 2022-02-22 DIAGNOSIS — E785 Hyperlipidemia, unspecified: Secondary | ICD-10-CM | POA: Diagnosis not present

## 2022-02-22 DIAGNOSIS — R627 Adult failure to thrive: Secondary | ICD-10-CM | POA: Diagnosis not present

## 2022-02-22 LAB — TYPE AND SCREEN
ABO/RH(D): O NEG
Antibody Screen: POSITIVE
Donor AG Type: NEGATIVE
Donor AG Type: NEGATIVE
PT AG Type: NEGATIVE
Unit division: 0
Unit division: 0

## 2022-02-22 LAB — BASIC METABOLIC PANEL
Anion gap: 6 (ref 5–15)
BUN: 8 mg/dL (ref 8–23)
CO2: 22 mmol/L (ref 22–32)
Calcium: 7.9 mg/dL — ABNORMAL LOW (ref 8.9–10.3)
Chloride: 105 mmol/L (ref 98–111)
Creatinine, Ser: 0.74 mg/dL (ref 0.44–1.00)
GFR, Estimated: >60 mL/min (ref >60)
Glucose, Bld: 118 mg/dL — ABNORMAL HIGH (ref 70–99)
Potassium: 3.8 mmol/L (ref 3.5–5.1)
Sodium: 133 mmol/L — ABNORMAL LOW (ref 135–145)

## 2022-02-22 LAB — BPAM RBC
Blood Product Expiration Date: 202402022359
Blood Product Expiration Date: 202402032359
Unit Type and Rh: 9500
Unit Type and Rh: 9500

## 2022-02-22 LAB — MAGNESIUM: Magnesium: 1.8 mg/dL (ref 1.7–2.4)

## 2022-02-22 MED ORDER — DILTIAZEM HCL ER COATED BEADS 120 MG PO CP24
120.0000 mg | ORAL_CAPSULE | Freq: Every day | ORAL | Status: DC
  Administered 2022-02-22 – 2022-02-26 (×5): 120 mg via ORAL
  Filled 2022-02-22 (×6): qty 1

## 2022-02-22 MED ORDER — AMIODARONE HCL 200 MG PO TABS
200.0000 mg | ORAL_TABLET | Freq: Two times a day (BID) | ORAL | Status: DC
  Administered 2022-02-22 – 2022-02-26 (×8): 200 mg via ORAL
  Filled 2022-02-22 (×10): qty 1

## 2022-02-22 MED ORDER — LEVOTHYROXINE SODIUM 25 MCG PO TABS
25.0000 ug | ORAL_TABLET | Freq: Every day | ORAL | Status: DC
  Administered 2022-02-23 – 2022-02-26 (×4): 25 ug via ORAL
  Filled 2022-02-22 (×4): qty 1

## 2022-02-22 MED ORDER — MAGNESIUM SULFATE 2 GM/50ML IV SOLN
2.0000 g | Freq: Once | INTRAVENOUS | Status: AC
  Administered 2022-02-22: 2 g via INTRAVENOUS
  Filled 2022-02-22: qty 50

## 2022-02-22 MED ORDER — AMIODARONE HCL 200 MG PO TABS: 200.0000 mg | ORAL_TABLET | Freq: Every day | ORAL | Status: DC

## 2022-02-22 MED ORDER — PANTOPRAZOLE SODIUM 40 MG PO TBEC
40.0000 mg | DELAYED_RELEASE_TABLET | Freq: Two times a day (BID) | ORAL | Status: DC
  Administered 2022-02-22 – 2022-02-26 (×8): 40 mg via ORAL
  Filled 2022-02-22 (×8): qty 1

## 2022-02-22 NOTE — Progress Notes (Signed)
PHARMACIST - PHYSICIAN COMMUNICATION  DR:   Sloan Leiter   CONCERNING: IV to Oral Route Change Policy  RECOMMENDATION: This patient is receiving protonix by the intravenous route.  Based on criteria approved by the Pharmacy and Therapeutics Committee, the intravenous medication(s) is/are being converted to the equivalent oral dose form(s).   DESCRIPTION: These criteria include: The patient is eating (either orally or via tube) and/or has been taking other orally administered medications for a least 24 hours The patient has no evidence of active gastrointestinal bleeding or impaired GI absorption (gastrectomy, short bowel, patient on TNA or NPO).  If you have questions about this conversion, please contact the Pharmacy Department  []   [phone] )  [facility] []   [phone] )  [facility] [x]   [phone] )  [facility] []   [phone] )  [facility] []   [phone] )  [facility]

## 2022-02-22 NOTE — Progress Notes (Signed)
PROGRESS NOTE        PATIENT DETAILS Name: Charlotte Powell Age: 89 y.o. Sex: female Date of Birth: 11-Mar-1928 Admit Date: 02/12/2022 Admitting Physician Lavina Hamman, MD SX:[id number], Nelda Bucks, NP  Brief Summary: Patient is a 89 y.o.  female with chronic atrial fibrillation, HTN, HLD, hypothyroidism-shortness of breath-patient was found to have A-fib RVR.  Significant events: 12/27>> admit to [facility]-A-fib RVR 01/03>> brief episode of hypotension-beta-blocker/Cardizem discontinued 01/04>> BP stabilized-remains in RVR-Cardizem started.  Significant studies: 12/28>> CTA chest: No PE, small bilateral pleural effusions. 01/03>> CT head: No acute intracranial abnormality.  Significant microbiology data: 12/27>> COVID/influenza/RSV PCR: Negative 12/27>> blood culture: No growth  Procedures: None  Consults: None  Subjective: Rate slowly improving but back in the 120s this morning.  Oral intake continues to be poor.  Objective: Vitals: Blood pressure 101/62, pulse (!) 107, temperature 98.3 F (36.8 C), temperature source Oral, resp. rate 20, height 5\' 3"  (1.6 m), weight 65.6 kg, SpO2 96 %.   Exam: Gen Exam:Alert awake-not in any distress HEENT:atraumatic, normocephalic Chest: B/L clear to auscultation anteriorly CVS:S1S2 regular Abdomen:soft non tender, non distended Extremities:no edema Neurology: Non focal Skin: no rash  Pertinent Labs/Radiology:    Latest Ref Rng & Units 02/19/2022   12:37 AM 02/18/2022    7:23 AM 02/18/2022    1:04 AM  CBC  WBC 4.0 - 10.5 K/uL 6.9  6.3  6.3   Hemoglobin 12.0 - 15.0 g/dL 12.1  12.3  8.1   Hematocrit 36.0 - 46.0 % 37.1  36.0  22.9   Platelets 150 - 400 K/uL 156  142  120     Lab Results  Component Value Date   NA 133 (L) 02/22/2022   K 3.8 02/22/2022   CL 105 02/22/2022   CO2 22 02/22/2022      Assessment/Plan: Chronic Afib with RVR Suspected tachybradycardia syndrome Rate slowly improving   Cardiology following-remains on Cardizem  She likely has underlying tachybradycardia syndrome-and has gotten bradycardic/hypotensive with aggressive rate control strategy. Family not interested in pursuing [facility] cardiology input.  Hypotension Occurred on 1/3-following administration of beta-blocker/Cardizem No evidence of sepsis/volume loss BP has now stabilized-responded to supportive care including gentle IV fluid bolus.    Failure to thrive syndrome Continues to have generalized weakness/poor appetite Unfortunately-do not see any reversible causes at this point Needs to continue to work with PT/OT Prior MD tried Remeron but apparently caused sedation Liberalize diet-encourage use of nutritional supplements.  Abdominal pain Soft belly-benign exam Likely from cough X-ray abdomen negative Supportive care  Peripheral neuropathy Continue Neurontin  Hypothyroidism Resume Synthroid   HLD Continue statin  Hypokalemia Repleted  Hypomagnesemia Continue supplementation  Hyponatremia Mild-of no clinical significance  Palliative care DNR in place Continues to have failure to thrive syndrome-continues to be quite challenging for rate control of A-fib given underlying tachybradycardia syndrome.  Family does not desire PPM placement. Palliative care consulted for further goals of care discussion.  BMI: Estimated body mass index is 25.62 kg/m as calculated from the following:   Height as of this encounter: 5\' 3"  (1.6 m).   Weight as of this encounter: 65.6 kg.   Code status:   Code Status: DNR   DVT Prophylaxis: apixaban (ELIQUIS) tablet 5 mg    Family Communication:  Daughter-Sandra Simpkins [phone] updated over the phone 1/5   Disposition  Plan: Status is: Inpatient Remains inpatient appropriate because: Severity of illness   Planned Discharge Destination: ALF with home health versus SNF-TOC team consulted on 1/5 to engage  family.   Diet: Diet Order             Diet regular Room service appropriate? Yes; Fluid consistency: Thin  Diet effective now                     Antimicrobial agents: Anti-infectives (From admission, onward)    None        MEDICATIONS: Scheduled Meds:  apixaban  5 mg Oral BID   benzonatate  200 mg Oral TID   dextromethorphan  30 mg Oral BID   diltiazem  120 mg Oral Daily   docusate sodium  100 mg Oral BID   feeding supplement  1 Container Oral TID BM   fluticasone  1 spray Each Nare Daily   gabapentin  300 mg Oral QHS   guaiFENesin  600 mg Oral BID   melatonin  5 mg Oral QHS   pantoprazole  40 mg Oral BID   polyethylene glycol  17 g Oral BID   rosuvastatin  20 mg Oral Daily   Continuous Infusions: PRN Meds:.acetaminophen **OR** acetaminophen, levalbuterol, menthol-cetylpyridinium, ondansetron **OR** ondansetron (ZOFRAN) IV, prochlorperazine   I have personally reviewed following labs and imaging studies  LABORATORY DATA: CBC: Recent Labs  Lab 02/16/22 0039 02/18/22 0104 02/18/22 0723 02/19/22 0037  WBC 7.7 6.3 6.3 6.9  HGB 10.5* 8.1* 12.3 12.1  HCT 30.2* 22.9* 36.0 37.1  MCV 103.1* 101.8* 103.4* 105.1*  PLT 147* 120* 142* 156     Basic Metabolic Panel: Recent Labs  Lab 02/16/22 0039 02/19/22 0037 02/20/22 0044 02/21/22 0044 02/22/22 0108  NA 130* 134* 134* 134* 133*  K 4.1 3.3* 3.4* 4.5 3.8  CL 100 101 103 105 105  CO2 23 22 24 23 22   GLUCOSE 85 105* 105* 132* 118*  BUN 23 14 15 10 8   CREATININE 0.90 0.93 0.93 0.84 0.74  CALCIUM 8.1* 8.2* 8.0* 8.2* 7.9*  MG 1.7  --   --  1.6* 1.8     GFR: Estimated Creatinine Clearance: 41.7 mL/min (by C-G formula based on SCr of 0.74 mg/dL).  Liver Function Tests: No results for input(s): "AST", "ALT", "ALKPHOS", "BILITOT", "PROT", "ALBUMIN" in the last 168 hours.  No results for input(s): "LIPASE", "AMYLASE" in the last 168 hours.  No results for input(s): "AMMONIA" in the last 168  hours.  Coagulation Profile: No results for input(s): "INR", "PROTIME" in the last 168 hours.  Cardiac Enzymes: No results for input(s): "CKTOTAL", "CKMB", "CKMBINDEX", "TROPONINI" in the last 168 hours.  BNP (last 3 results) No results for input(s): "PROBNP" in the last 8760 hours.  Lipid Profile: No results for input(s): "CHOL", "HDL", "LDLCALC", "TRIG", "CHOLHDL", "LDLDIRECT" in the last 72 hours.  Thyroid Function Tests: No results for input(s): "TSH", "T4TOTAL", "FREET4", "T3FREE", "THYROIDAB" in the last 72 hours.  Anemia Panel: No results for input(s): "VITAMINB12", "FOLATE", "FERRITIN", "TIBC", "IRON", "RETICCTPCT" in the last 72 hours.  Urine analysis:    Component Value Date/Time   COLORURINE AMBER (A) 02/12/2022 1523   APPEARANCEUR HAZY (A) 02/12/2022 1523   LABSPEC 1.030 02/12/2022 1523   PHURINE 5.0 02/12/2022 1523   GLUCOSEU NEGATIVE 02/12/2022 1523   HGBUR NEGATIVE 02/12/2022 1523   BILIRUBINUR NEGATIVE 02/12/2022 1523   KETONESUR NEGATIVE 02/12/2022 1523   PROTEINUR 100 (A) 02/12/2022 1523   NITRITE NEGATIVE  02/12/2022 1523   LEUKOCYTESUR NEGATIVE 02/12/2022 1523    Sepsis Labs: Lactic Acid, Venous    Component Value Date/Time   LATICACIDVEN 1.3 02/13/2022 0222    MICROBIOLOGY: Recent Results (from the past 240 hour(s))  Resp panel by RT-PCR (RSV, Flu A&B, Covid) Anterior Nasal Swab     Status: None   Collection Time: 02/12/22 12:57 PM   Specimen: Anterior Nasal Swab  Result Value Ref Range Status   SARS Coronavirus 2 by RT PCR NEGATIVE NEGATIVE Final    Comment: (NOTE) SARS-CoV-2 target nucleic acids are NOT DETECTED.  The SARS-CoV-2 RNA is generally detectable in upper respiratory specimens during the acute phase of infection. The lowest concentration of SARS-CoV-2 viral copies this assay can detect is 138 copies/mL. A negative result does not preclude SARS-Cov-2 infection and should not be used as the sole basis for treatment or other  patient management decisions. A negative result may occur with  improper specimen collection/handling, submission of specimen other than nasopharyngeal swab, presence of viral mutation(s) within the areas targeted by this assay, and inadequate number of viral copies(<138 copies/mL). A negative result must be combined with clinical observations, patient history, and epidemiological information. The expected result is Negative.  Fact Sheet for Patients:  EntrepreneurPulse.com.au  Fact Sheet for Healthcare Providers:  IncredibleEmployment.be  This test is no t yet approved or cleared by the [place] FDA and  has been authorized for detection and/or diagnosis of SARS-CoV-2 by FDA under an Emergency Use Authorization (EUA). This EUA will remain  in effect (meaning this test can be used) for the duration of the COVID-19 declaration under Section 564(b)(1) of the Act, 21 U.S.C.section 360bbb-3(b)(1), unless the authorization is terminated  or revoked sooner.       Influenza A by PCR NEGATIVE NEGATIVE Final   Influenza B by PCR NEGATIVE NEGATIVE Final    Comment: (NOTE) The Xpert Xpress SARS-CoV-2/FLU/RSV plus assay is intended as an aid in the diagnosis of influenza from Nasopharyngeal swab specimens and should not be used as a sole basis for treatment. Nasal washings and aspirates are unacceptable for Xpert Xpress SARS-CoV-2/FLU/RSV testing.  Fact Sheet for Patients: EntrepreneurPulse.com.au  Fact Sheet for Healthcare Providers: IncredibleEmployment.be  This test is not yet approved or cleared by the [place] FDA and has been authorized for detection and/or diagnosis of SARS-CoV-2 by FDA under an Emergency Use Authorization (EUA). This EUA will remain in effect (meaning this test can be used) for the duration of the COVID-19 declaration under Section 564(b)(1) of the Act, 21 U.S.C. section  360bbb-3(b)(1), unless the authorization is terminated or revoked.     Resp Syncytial Virus by PCR NEGATIVE NEGATIVE Final    Comment: (NOTE) Fact Sheet for Patients: EntrepreneurPulse.com.au  Fact Sheet for Healthcare Providers: IncredibleEmployment.be  This test is not yet approved or cleared by the [place] FDA and has been authorized for detection and/or diagnosis of SARS-CoV-2 by FDA under an Emergency Use Authorization (EUA). This EUA will remain in effect (meaning this test can be used) for the duration of the COVID-19 declaration under Section 564(b)(1) of the Act, 21 U.S.C. section 360bbb-3(b)(1), unless the authorization is terminated or revoked.  Performed at [facility], [place] [address], [place], [place] [address]   Blood culture (routine x 2)     Status: None   Collection Time: 02/12/22 12:58 PM   Specimen: BLOOD  Result Value Ref Range Status   Specimen Description BLOOD LEFT ANTECUBITAL  Final   Special  Requests   Final    BOTTLES DRAWN AEROBIC AND ANAEROBIC Blood Culture results may not be optimal due to an inadequate volume of blood received in culture bottles   Culture   Final    NO GROWTH 5 DAYS Performed at [facility], [address] [place], [place] [address]    Report Status 02/17/2022 FINAL  Final  Blood culture (routine x 2)     Status: None   Collection Time: 02/12/22  7:13 PM   Specimen: BLOOD  Result Value Ref Range Status   Specimen Description BLOOD SITE NOT SPECIFIED  Final   Special Requests   Final    BOTTLES DRAWN AEROBIC ONLY Blood Culture results may not be optimal due to an inadequate volume of blood received in culture bottles   Culture   Final    NO GROWTH 5 DAYS Performed at [facility], [address], [place], [place] [address]    Report Status 02/17/2022 FINAL  Final    RADIOLOGY STUDIES/RESULTS: No results found.   LOS: 9 days   Jeoffrey Massed, MD  [facility]    To contact the attending provider between 7A-7P or the covering provider during after hours 7P-7A, please log into the web site [email or web address] and access using universal [place] password for that web site. If you do not have the password, please call the hospital operator.  02/22/2022, 10:46 AM

## 2022-02-22 NOTE — Progress Notes (Signed)
Progress Note  Patient Name: Charlotte Powell Date of Encounter: 02/22/2022  Primary Cardiologist: Godfrey Pick Tobb, DO  Subjective   No acute events overnight.  Patient denied any symptoms.  Telemetry reviewed, HR 1 10-1 40s and in atrial fibrillation.  Inpatient Medications    Scheduled Meds:  apixaban  5 mg Oral BID   benzonatate  200 mg Oral TID   dextromethorphan  30 mg Oral BID   diltiazem  120 mg Oral Daily   docusate sodium  100 mg Oral BID   feeding supplement  1 Container Oral TID BM   fluticasone  1 spray Each Nare Daily   gabapentin  300 mg Oral QHS   guaiFENesin  600 mg Oral BID   melatonin  5 mg Oral QHS   pantoprazole (PROTONIX) IV  40 mg Intravenous Q12H   polyethylene glycol  17 g Oral BID   rosuvastatin  20 mg Oral Daily   Continuous Infusions:  magnesium sulfate bolus IVPB 2 g (02/22/22 0817)   PRN Meds: acetaminophen **OR** acetaminophen, levalbuterol, menthol-cetylpyridinium, ondansetron **OR** ondansetron (ZOFRAN) IV, prochlorperazine   Vital Signs    Vitals:   02/21/22 2343 02/22/22 0401 02/22/22 0404 02/22/22 0746  BP: (!) 112/53 124/71  (!) 112/59  Pulse: 79 71  (!) 147  Resp: 20 18 20 20   Temp: 98.4 F (36.9 C) 98.4 F (36.9 C) 98.5 F (36.9 C) 98.3 F (36.8 C)  TempSrc: Oral Oral Oral Oral  SpO2: 98% 95%  94%  Weight:   65.6 kg   Height:        Intake/Output Summary (Last 24 hours) at 02/22/2022 0855 Last data filed at 02/22/2022 0746 Gross per 24 hour  Intake 0 ml  Output 850 ml  Net -850 ml   Filed Weights   02/20/22 0508 02/21/22 0530 02/22/22 0404  Weight: 65.7 kg 64.9 kg 65.6 kg    Telemetry    Personally reviewed, A-fib with RVR, HR 110-140  ECG    Atrial fibrillation  Physical Exam   GEN: No acute distress.   Neck: No JVD. Cardiac: Irregular rate and rhythm, no murmur, rub, or gallop.  Respiratory: Nonlabored. Clear to auscultation bilaterally. GI: Soft, nontender, bowel sounds present. MS: No edema; No  deformity. Neuro:  Nonfocal. Psych: Alert and oriented x 3. Normal affect.  Labs    Chemistry Recent Labs  Lab 02/20/22 0044 02/21/22 0044 02/22/22 0108  NA 134* 134* 133*  K 3.4* 4.5 3.8  CL 103 105 105  CO2 24 23 22   GLUCOSE 105* 132* 118*  BUN 15 10 8   CREATININE 0.93 0.84 0.74  CALCIUM 8.0* 8.2* 7.9*  GFRNONAA 58* >60 >60  ANIONGAP 7 6 6      Hematology Recent Labs  Lab 02/18/22 0104 02/18/22 0723 02/19/22 0037  WBC 6.3 6.3 6.9  RBC 2.25* 3.48* 3.53*  HGB 8.1* 12.3 12.1  HCT 22.9* 36.0 37.1  MCV 101.8* 103.4* 105.1*  MCH 36.0* 35.3* 34.3*  MCHC 35.4 34.2 32.6  RDW 14.0 14.1 14.4  PLT 120* 142* 156    Cardiac Enzymes Recent Labs  Lab 02/12/22 1257 02/12/22 1625 02/13/22 0554  TROPONINIHS 49* 47* 42*    BNPNo results for input(s): "BNP", "PROBNP" in the last 168 hours.   DDimerNo results for input(s): "DDIMER" in the last 168 hours.   Radiology    No results found.   Assessment & Plan    Patient is a 89 year old F known to have CAD, HTN, HLD, pulm and  A-fib on [facility] admitted with weakness, malaise, decreased p.o. intake, possible pneumonia as well as A-fib with RVR.  Echo this admission showed LVEF 60 to 65%, mild RV enlargement, moderate LA enlargement, mild MR, moderate TR.  CTA showed no PE and small bilateral pleural effusions.  # Permanent A-fib with RVR, HR 110-140s -Continue Cardizem CD 120 mg once daily. Add amiodarone 200 mg twice daily for 3 weeks followed by amiodarone 200 mg once daily. Avoid BB with CCB due to significant hypotension and bradycardia (HR in 30s) during this admission and daughter would like to avoid pacemaker. -Continue Eliquis 5 mg twice daily  # CAD -No need of aspirin as she is on Eliquis  I have spent a total of 31 minutes with patient reviewing chart , telemetry, EKGs, labs and examining patient as well as establishing an assessment and plan that was discussed with the patient.  > 50% of time was spent in direct  patient care.     Signed, Marjo Bicker, MD  02/22/2022, 8:55 AM

## 2022-02-22 NOTE — Consult Note (Addendum)
Consultation Note Date: 02/22/2022   Patient Name: Charlotte Powell  DOB: [birth year]  MRN: [id number]  Age / Sex: 89 y.o., female  PCP: Ngetich, Donalee Citrin, NP Referring Physician: Maretta Bees, MD  Reason for Consultation: Establishing goals of care  HPI/Patient Profile: 89 y.o. female  with past medical history of CAD, HTN, HLD, pulm and A-fib on [facility] admitted on 02/12/2022 with weakness and malaise. Found to have afib RVR. Developed significant hypotension and bradycardia with CCB and BB. Now on CCB and amiodarone. Avoiding pacemaker. Also with ongoing failure to thrive. PMT consulted to discuss GOC.    Clinical Assessment and Goals of Care: I have reviewed medical records including EPIC notes, labs and imaging,  assessed the patient and then met with patient and daughter [name] to discuss diagnosis prognosis, GOC, EOL wishes, disposition and options.  I introduced Palliative Medicine as specialized medical care for people living with serious illness. It focuses on providing relief from the symptoms and stress of a serious illness. The goal is to improve quality of life for both the patient and the family.  We discussed a brief life review of the patient.  Patient has been living at current facility for about a year and a half.  As far as functional and nutritional status patient tells me she used to be more ambulatory but since her hospitalization for rotavirus she has been mostly in a wheelchair.  We discussed her extreme weakness.  She tells me she has an okay appetite daughter reports that is poor.   We discussed patient's current illness and what it means in the larger context of patient's on-going co-morbidities.  Natural disease trajectory and expectations at EOL were discussed.  We discussed her cardiac condition and their desire to avoid pacemaker.  We discussed her overall failure to thrive.  I attempted to elicit values and goals of  care important to the patient.    The difference between aggressive medical intervention and comfort care was considered in light of the patient's goals of care.   MOST form was introduced but not completed.  Discussed with patient and daughter the importance of continued conversation with family and the medical providers regarding overall plan of care and treatment options, ensuring decisions are within the context of the patient's values and GOCs.    Hospice and Palliative Care services outpatient were explained and offered.  Extensively discussed options of going to SNF for rehab versus going back to ALF with either home health or hospice.  Family does not feel that ALF with home health will provide enough support so they are attempting to choose between SNF or ALF with hospice.  Daughter and patient request time to consider and also talk to your staff at ALF to determine what is feasible.  Questions and concerns were addressed. The family was encouraged to call with questions or concerns.    Primary Decision Maker PATIENT Daughter as decision-maker if patient unable    SUMMARY OF RECOMMENDATIONS   DNR/DNI MOST introduced but not yet completed Patient and daughter attempting to choose between SNF for rehab versus back to ALF with hospice support, request time to consider and discussed with ALF staff PMT to check in with daughter Sheral Flow afternoon after she has a chance to speak with ALF staff  Code Status/Advance Care Planning: DNR   Symptom Management:  Denies needs  Discharge Planning: To Be Determined      Primary Diagnoses: Present on Admission:  Chronic atrial fibrillation with RVR (HCC)  I have reviewed the medical record, interviewed the patient and family, and examined the patient. The following aspects are pertinent.  Past Medical History:  Diagnosis Date   Atrial fibrillation (HCC)    CAD (coronary artery disease)    Diverticulitis    GERD (gastroesophageal  reflux disease)    Heart disease    HLD (hyperlipidemia)    HTN (hypertension)    Hypothyroidism    Vitamin D deficiency    Social History   Socioeconomic History   Marital status: Widowed    Spouse name: Not on file   Number of children: Not on file   Years of education: Not on file   Highest education level: Not on file  Occupational History   Occupation: retired  Tobacco Use   Smoking status: Never   Smokeless tobacco: Never  Substance and Sexual Activity   Alcohol use: Never   Drug use: Never   Sexual activity: Not on file  Other Topics Concern   Not on file  Social History Narrative   Not on file   Social Determinants of Health   Financial Resource Strain: Not on file  Food Insecurity: No Food Insecurity (02/12/2022)   Hunger Vital Sign    Worried About Running Out of Food in the Last Year: Never true    Ran Out of Food in the Last Year: Never true  Transportation Needs: No Transportation Needs (02/12/2022)   PRAPARE - Hydrologist (Medical): No    Lack of Transportation (Non-Medical): No  Physical Activity: Not on file  Stress: Not on file  Social Connections: Not on file   Family History  Problem Relation Age of Onset   Stomach cancer Neg Hx    Scheduled Meds:  apixaban  5 mg Oral BID   benzonatate  200 mg Oral TID   dextromethorphan  30 mg Oral BID   diltiazem  120 mg Oral Daily   docusate sodium  100 mg Oral BID   feeding supplement  1 Container Oral TID BM   fluticasone  1 spray Each Nare Daily   gabapentin  300 mg Oral QHS   guaiFENesin  600 mg Oral BID   [START ON 02/23/2022] levothyroxine  25 mcg Oral Daily   melatonin  5 mg Oral QHS   pantoprazole  40 mg Oral BID   polyethylene glycol  17 g Oral BID   rosuvastatin  20 mg Oral Daily   Continuous Infusions: PRN Meds:.acetaminophen **OR** acetaminophen, levalbuterol, menthol-cetylpyridinium, ondansetron **OR** ondansetron (ZOFRAN) IV, prochlorperazine Allergies   Allergen Reactions   Baclofen Other (See Comments)    hypoactive encephalopathy due to baclofen   Amoxicillin Other (See Comments)    unknown   Hydrocodone Other (See Comments)    Confusion and AMS   Review of Systems  Constitutional:  Positive for activity change and fatigue.  Neurological:  Positive for weakness.    Physical Exam Constitutional:      General: She is not in acute distress.    Appearance: She is ill-appearing.  Cardiovascular:     Rate and Rhythm: Tachycardia present. Rhythm irregular.  Pulmonary:     Effort: Pulmonary effort is normal.  Skin:    General: Skin is warm and dry.  Neurological:     Mental Status: She is alert and oriented to person, place, and time.     Vital Signs: BP (!) 103/50 (BP Location: Right Arm)   Pulse 66   Temp 97.8 F (36.6 C) (  Oral)   Resp 20   Ht 5\' 3"  (1.6 m)   Wt 65.6 kg   SpO2 98%   BMI 25.62 kg/m  Pain Scale: 0-10   Pain Score: 0-No pain   SpO2: SpO2: 98 % O2 Device:SpO2: 98 % O2 Flow Rate: .O2 Flow Rate (L/min): 2 L/min  IO: Intake/output summary:  Intake/Output Summary (Last 24 hours) at 02/22/2022 1233 Last data filed at 02/22/2022 1137 Gross per 24 hour  Intake 0 ml  Output 1000 ml  Net -1000 ml    LBM: Last BM Date : 02/21/22 Baseline Weight: Weight: 62.1 kg Most recent weight: Weight: 65.6 kg     Palliative Assessment/Data: PPS 30%   *Please note that this is a verbal dictation therefore any spelling or grammatical errors are due to the "[employer]agon [employer] One" system interpretation.  04/22/22, DNP, AGNP-C Palliative Medicine Team [phone] Pager: [phone]

## 2022-02-23 DIAGNOSIS — I4891 Unspecified atrial fibrillation: Secondary | ICD-10-CM | POA: Diagnosis not present

## 2022-02-23 DIAGNOSIS — I482 Chronic atrial fibrillation, unspecified: Secondary | ICD-10-CM | POA: Diagnosis not present

## 2022-02-23 DIAGNOSIS — I2510 Atherosclerotic heart disease of native coronary artery without angina pectoris: Secondary | ICD-10-CM | POA: Diagnosis not present

## 2022-02-23 NOTE — Progress Notes (Signed)
Progress Note  Patient Name: Charlotte Powell Date of Encounter: 02/23/2022  Primary Cardiologist: Godfrey Pick Tobb, DO  Subjective   No acute events overnight.  Patient denied having any symptoms.  Telemetry reviewed, HR 110s to 140s.  Inpatient Medications    Scheduled Meds:  amiodarone  200 mg Oral BID   Followed by   Derrill Memo ON 03/15/2022] amiodarone  200 mg Oral Daily   apixaban  5 mg Oral BID   benzonatate  200 mg Oral TID   dextromethorphan  30 mg Oral BID   diltiazem  120 mg Oral Daily   docusate sodium  100 mg Oral BID   feeding supplement  1 Container Oral TID BM   fluticasone  1 spray Each Nare Daily   gabapentin  300 mg Oral QHS   guaiFENesin  600 mg Oral BID   levothyroxine  25 mcg Oral Daily   melatonin  5 mg Oral QHS   pantoprazole  40 mg Oral BID   polyethylene glycol  17 g Oral BID   rosuvastatin  20 mg Oral Daily   Continuous Infusions:   PRN Meds: acetaminophen **OR** acetaminophen, levalbuterol, menthol-cetylpyridinium, ondansetron **OR** ondansetron (ZOFRAN) IV, prochlorperazine   Vital Signs    Vitals:   02/22/22 2331 02/23/22 0536 02/23/22 0823 02/23/22 1052  BP: (!) 117/58 110/78 (!) 110/91   Pulse: 67 (!) 102 65 67  Resp: 20 20 (!) 22 20  Temp: 98.3 F (36.8 C) (!) 97.5 F (36.4 C) (!) 97.5 F (36.4 C)   TempSrc: Oral Oral Oral   SpO2: 97% 93% 96% 96%  Weight:  64.9 kg    Height:        Intake/Output Summary (Last 24 hours) at 02/23/2022 1127 Last data filed at 02/23/2022 0500 Gross per 24 hour  Intake 220 ml  Output 1425 ml  Net -1205 ml   Filed Weights   02/21/22 0530 02/22/22 0404 02/23/22 0536  Weight: 64.9 kg 65.6 kg 64.9 kg    Telemetry    Personally reviewed, A-fib with RVR, HR 120-140s   ECG    Atrial fibrillation  Physical Exam   GEN: No acute distress.   Neck: No JVD. Cardiac: Irregular rate and rhythm, no murmur, rub, or gallop.  Respiratory: Nonlabored. Clear to auscultation bilaterally. GI: Soft, nontender,  bowel sounds present. MS: No edema; No deformity. Neuro:  Nonfocal. Psych: Alert and oriented x 3. Normal affect.  Labs    Chemistry Recent Labs  Lab 02/20/22 0044 02/21/22 0044 02/22/22 0108  NA 134* 134* 133*  K 3.4* 4.5 3.8  CL 103 105 105  CO2 24 23 22   GLUCOSE 105* 132* 118*  BUN 15 10 8   CREATININE 0.93 0.84 0.74  CALCIUM 8.0* 8.2* 7.9*  GFRNONAA 58* >60 >60  ANIONGAP 7 6 6      Hematology Recent Labs  Lab 02/18/22 0104 02/18/22 0723 02/19/22 0037  WBC 6.3 6.3 6.9  RBC 2.25* 3.48* 3.53*  HGB 8.1* 12.3 12.1  HCT 22.9* 36.0 37.1  MCV 101.8* 103.4* 105.1*  MCH 36.0* 35.3* 34.3*  MCHC 35.4 34.2 32.6  RDW 14.0 14.1 14.4  PLT 120* 142* 156    Cardiac Enzymes Recent Labs  Lab 02/12/22 1257 02/12/22 1625 02/13/22 0554  TROPONINIHS 49* 47* 42*    BNPNo results for input(s): "BNP", "PROBNP" in the last 168 hours.   DDimerNo results for input(s): "DDIMER" in the last 168 hours.   Radiology    No results found.   Assessment &  Plan    Patient is a 89 year old F known to have CAD, HTN, HLD, pulm and A-fib on [facility] admitted with weakness, malaise, decreased p.o. intake, possible pneumonia as well as A-fib with RVR.  Echo this admission showed LVEF 60 to 65%, mild RV enlargement, moderate LA enlargement, mild MR, moderate TR.  CTA showed no PE and small bilateral pleural effusions.  # Permanent A-fib with RVR -Continue Cardizem CD 120 mg once daily. -Continue amiodarone 200 mg twice daily for 3 weeks followed by amiodarone 200 mg once daily. -If her HR's are not very well-controlled in the next 24 to 48 hours, she might benefit from Cardizem dose increase. -Avoid BB with CCB due to significant hypotension and bradycardia (HR in 30s) during this admission daughter would like to avoid pacemaker. -Continue Eliquis 5 mg twice daily  # CAD -No need of aspirin as she is on Eliquis  I have spent a total of 31 minutes with patient reviewing chart , telemetry, EKGs,  labs and examining patient as well as establishing an assessment and plan that was discussed with the patient.  > 50% of time was spent in direct patient care.     Signed, Marjo Bicker, MD  02/23/2022, 11:27 AM

## 2022-02-23 NOTE — Progress Notes (Signed)
CCMD called to let RN know that patient's HR decreasing into 40s. Upon assessment, patient was awake and stated that she felt fine. Daughter stated that when she fell asleep earlier, the patient's HR had dropped to 90. RN notified attending and cardiology MD. Per cardiology, hold evening dose of amiodarone and restart tomorrow AM only if HR is higher.

## 2022-02-23 NOTE — Progress Notes (Signed)
PROGRESS NOTE        PATIENT DETAILS Name: Charlotte Powell Age: 89 y.o. Sex: female Date of Birth: Sep 12, 1928 Admit Date: 02/12/2022 Admitting Physician Lavina Hamman, MD [id number], Nelda Bucks, NP  Brief Summary: Patient is a 89 y.o.  female with chronic atrial fibrillation, HTN, HLD, hypothyroidism-shortness of breath-patient was found to have A-fib RVR.  Significant events: 12/27>> admit to [facility]-A-fib RVR 01/03>> brief episode of hypotension-beta-blocker/Cardizem discontinued 01/04>> BP stabilized-remains in RVR-Cardizem started.  Significant studies: 12/28>> CTA chest: No PE, small bilateral pleural effusions. 01/03>> CT head: No acute intracranial abnormality.  Significant microbiology data: 12/27>> COVID/influenza/RSV PCR: Negative 12/27>> blood culture: No growth  Procedures: None  Consults: Cardiology Palliative care  Subjective: Heart rate was controlled for most of the day yesterday, back in RVR this morning.  Per nursing staff-was able to consume a bit more oral intake yesterday.  No family at bedside.   Objective: Vitals: Blood pressure (!) 110/91, pulse 65, temperature (!) 97.5 F (36.4 C), temperature source Oral, resp. rate (!) 22, height 5\' 3"  (1.6 m), weight 64.9 kg, SpO2 96 %.   Exam: Gen Exam:Alert awake-not in any distress HEENT:atraumatic, normocephalic Chest: B/L clear to auscultation anteriorly CVS:S1S2 regular Abdomen:soft non tender, non distended Extremities:no edema Neurology: Non focal Skin: no rash  Pertinent Labs/Radiology:    Latest Ref Rng & Units 02/19/2022   12:37 AM 02/18/2022    7:23 AM 02/18/2022    1:04 AM  CBC  WBC 4.0 - 10.5 K/uL 6.9  6.3  6.3   Hemoglobin 12.0 - 15.0 g/dL 12.1  12.3  8.1   Hematocrit 36.0 - 46.0 % 37.1  36.0  22.9   Platelets 150 - 400 K/uL 156  142  120     Lab Results  Component Value Date   NA 133 (L) 02/22/2022   K 3.8 02/22/2022   CL 105 02/22/2022   CO2 22 02/22/2022       Assessment/Plan: Chronic Afib with RVR Suspected tachybradycardia syndrome Rate slowly improving  Cardiology following-remains on Cardizem, cardiology added amiodarone yesterday. She likely has underlying tachybradycardia syndrome-and has gotten bradycardic/hypotensive with aggressive rate control strategy.  Plan is to allow some amount of permissive RVR. Family not interested in pursuing PPM Continue Eliquis  Hypotension Occurred on 1/3-following administration of beta-blocker/Cardizem No evidence of sepsis/volume loss BP has now stabilized-responded to supportive care including gentle IV fluid bolus.    Failure to thrive syndrome Continues to have generalized weakness/-although appetite issues seems to be slowly improving. Unfortunately-do not see any reversible causes at this point Needs to continue to work with PT/OT Prior MD tried Remeron but apparently caused sedation Liberalize diet-encourage use of nutritional supplements.  Abdominal pain Soft belly-benign exam Likely from cough X-ray abdomen negative Supportive care  Peripheral neuropathy Continue Neurontin  Hypothyroidism Resume Synthroid   HLD Continue statin  Hypokalemia Repleted  Hypomagnesemia Continue supplementation  Hyponatremia Mild-of no clinical significance  Palliative care DNR in place Continues to have failure to thrive syndrome-continues to be quite challenging for rate control of A-fib given underlying tachybradycardia syndrome.  Family does not desire PPM placement. Palliative care consulted for further goals of care discussion.  BMI: Estimated body mass index is 25.33 kg/m as calculated from the following:   Height as of this encounter: 5\' 3"  (1.6 m).   Weight as of this encounter:  64.9 kg.   Code status:   Code Status: DNR   DVT Prophylaxis: apixaban (ELIQUIS) tablet 5 mg    Family Communication:  Daughter-Sandra Simpkins [phone] updated over the phone  1/7   Disposition Plan: Status is: Inpatient Remains inpatient appropriate because: Severity of illness   Planned Discharge Destination: ALF with home health versus SNF-TOC team consulted on 1/5 to engage family.   Diet: Diet Order             Diet regular Room service appropriate? Yes; Fluid consistency: Thin  Diet effective now                     Antimicrobial agents: Anti-infectives (From admission, onward)    None        MEDICATIONS: Scheduled Meds:  amiodarone  200 mg Oral BID   Followed by   Melene Muller ON 03/15/2022] amiodarone  200 mg Oral Daily   apixaban  5 mg Oral BID   benzonatate  200 mg Oral TID   dextromethorphan  30 mg Oral BID   diltiazem  120 mg Oral Daily   docusate sodium  100 mg Oral BID   feeding supplement  1 Container Oral TID BM   fluticasone  1 spray Each Nare Daily   gabapentin  300 mg Oral QHS   guaiFENesin  600 mg Oral BID   levothyroxine  25 mcg Oral Daily   melatonin  5 mg Oral QHS   pantoprazole  40 mg Oral BID   polyethylene glycol  17 g Oral BID   rosuvastatin  20 mg Oral Daily   Continuous Infusions: PRN Meds:.acetaminophen **OR** acetaminophen, levalbuterol, menthol-cetylpyridinium, ondansetron **OR** ondansetron (ZOFRAN) IV, prochlorperazine   I have personally reviewed following labs and imaging studies  LABORATORY DATA: CBC: Recent Labs  Lab 02/18/22 0104 02/18/22 0723 02/19/22 0037  WBC 6.3 6.3 6.9  HGB 8.1* 12.3 12.1  HCT 22.9* 36.0 37.1  MCV 101.8* 103.4* 105.1*  PLT 120* 142* 156     Basic Metabolic Panel: Recent Labs  Lab 02/19/22 0037 02/20/22 0044 02/21/22 0044 02/22/22 0108  NA 134* 134* 134* 133*  K 3.3* 3.4* 4.5 3.8  CL 101 103 105 105  CO2 22 24 23 22   GLUCOSE 105* 105* 132* 118*  BUN 14 15 10 8   CREATININE 0.93 0.93 0.84 0.74  CALCIUM 8.2* 8.0* 8.2* 7.9*  MG  --   --  1.6* 1.8     GFR: Estimated Creatinine Clearance: 41.5 mL/min (by C-G formula based on SCr of 0.74  mg/dL).  Liver Function Tests: No results for input(s): "AST", "ALT", "ALKPHOS", "BILITOT", "PROT", "ALBUMIN" in the last 168 hours.  No results for input(s): "LIPASE", "AMYLASE" in the last 168 hours.  No results for input(s): "AMMONIA" in the last 168 hours.  Coagulation Profile: No results for input(s): "INR", "PROTIME" in the last 168 hours.  Cardiac Enzymes: No results for input(s): "CKTOTAL", "CKMB", "CKMBINDEX", "TROPONINI" in the last 168 hours.  BNP (last 3 results) No results for input(s): "PROBNP" in the last 8760 hours.  Lipid Profile: No results for input(s): "CHOL", "HDL", "LDLCALC", "TRIG", "CHOLHDL", "LDLDIRECT" in the last 72 hours.  Thyroid Function Tests: No results for input(s): "TSH", "T4TOTAL", "FREET4", "T3FREE", "THYROIDAB" in the last 72 hours.  Anemia Panel: No results for input(s): "VITAMINB12", "FOLATE", "FERRITIN", "TIBC", "IRON", "RETICCTPCT" in the last 72 hours.  Urine analysis:    Component Value Date/Time   COLORURINE AMBER (A) 02/12/2022 1523   APPEARANCEUR HAZY (  A) 02/12/2022 1523   LABSPEC 1.030 02/12/2022 1523   PHURINE 5.0 02/12/2022 1523   GLUCOSEU NEGATIVE 02/12/2022 1523   HGBUR NEGATIVE 02/12/2022 1523   BILIRUBINUR NEGATIVE 02/12/2022 1523   KETONESUR NEGATIVE 02/12/2022 1523   PROTEINUR 100 (A) 02/12/2022 1523   NITRITE NEGATIVE 02/12/2022 1523   LEUKOCYTESUR NEGATIVE 02/12/2022 1523    Sepsis Labs: Lactic Acid, Venous    Component Value Date/Time   LATICACIDVEN 1.3 02/13/2022 0222    MICROBIOLOGY: No results found for this or any previous visit (from the past 240 hour(s)).   RADIOLOGY STUDIES/RESULTS: No results found.   LOS: 10 days   Jeoffrey Massed, MD  [facility]    To contact the attending provider between 7A-7P or the covering provider during after hours 7P-7A, please log into the web site [email or web address] and access using universal [place] password for that web site. If you do not have the  password, please call the hospital operator.  02/23/2022, 10:08 AM

## 2022-02-23 NOTE — Progress Notes (Signed)
   Progress Note  Patient Name: Charlotte Powell Date of Encounter: 02/23/2022  Nurse notified that HR dropping into 47s. Patient asymptomatic. Hold Amiodarone and restart Amiodarone 200 mg once daily only if HR > 100 bpm.  Signed, Elysabeth Aust P Lavarr President, MD  02/23/2022, 3:55 PM

## 2022-02-24 DIAGNOSIS — I4821 Permanent atrial fibrillation: Secondary | ICD-10-CM | POA: Diagnosis not present

## 2022-02-24 DIAGNOSIS — Z7189 Other specified counseling: Secondary | ICD-10-CM | POA: Diagnosis not present

## 2022-02-24 DIAGNOSIS — I2510 Atherosclerotic heart disease of native coronary artery without angina pectoris: Secondary | ICD-10-CM | POA: Diagnosis not present

## 2022-02-24 DIAGNOSIS — I482 Chronic atrial fibrillation, unspecified: Secondary | ICD-10-CM | POA: Diagnosis not present

## 2022-02-24 MED ORDER — POTASSIUM CHLORIDE CRYS ER 20 MEQ PO TBCR
40.0000 meq | EXTENDED_RELEASE_TABLET | Freq: Once | ORAL | Status: AC
  Administered 2022-02-24: 40 meq via ORAL
  Filled 2022-02-24: qty 2

## 2022-02-24 MED ORDER — FUROSEMIDE 10 MG/ML IJ SOLN
20.0000 mg | Freq: Once | INTRAMUSCULAR | Status: AC
  Administered 2022-02-24: 20 mg via INTRAVENOUS
  Filled 2022-02-24: qty 2

## 2022-02-24 NOTE — Progress Notes (Signed)
Occupational Therapy Treatment Patient Details Name: Charlotte Powell MRN: [id number] DOB: 09/23/1928 Today's Date: 02/24/2022   History of present illness Pt is a 89 year old woman who presented to [facility] [address] on 02/12/22 from [place] with SOB, nausea and chest pain with afib/RVR. Pt with recent admission for rotovirus and reportedly has not regained her strength since. PMH: afib, CAD, GERD, HTN, HLD, Hypothyroidism, neuropathy, compression fractures.   OT comments  Pt not progressing towards goals, self limiting at times and reports fatigue. Pt needing mod encouragement, able to complete UB and LB therex at bed level during session along with grooming task. Pt able to demo use of IS x5. Pt presenting with impairments listed below, will follow acutely. Recommend HHOT at ALF pending progression.   Recommendations for follow up therapy are one component of a multi-disciplinary discharge planning process, led by the attending physician.  Recommendations may be updated based on patient status, additional functional criteria and insurance authorization.    Follow Up Recommendations  Home health OT (at ALF, pending progression)     Assistance Recommended at Discharge Frequent or constant Supervision/Assistance  Patient can return home with the following  A lot of help with walking and/or transfers;A lot of help with bathing/dressing/bathroom;Assistance with cooking/housework;Assist for transportation;Help with stairs or ramp for entrance   Equipment Recommendations  Wheelchair (measurements OT);Wheelchair cushion (measurements OT);Hospital bed    Recommendations for Other Services PT consult    Precautions / Restrictions Precautions Precautions: Fall Precaution Comments: watch HR and BP Restrictions Weight Bearing Restrictions: No       Mobility Bed Mobility Overal bed mobility: Needs Assistance             General bed mobility comments: Pt declined bed mobility, but agreeable to  focus session on therapeutic exercises supine in bed.    Transfers                   General transfer comment: Pt declined OOB mobility, but agreeable to focus session on therapeutic exercises supine in bed.     Balance                                           ADL either performed or assessed with clinical judgement   ADL Overall ADL's : Needs assistance/impaired     Grooming: Brushing hair;Sitting Grooming Details (indicate cue type and reason): combing hair at bed level                               General ADL Comments: pt declining ADLs and OOB mobility    Extremity/Trunk Assessment Upper Extremity Assessment Upper Extremity Assessment: Generalized weakness   Lower Extremity Assessment Lower Extremity Assessment: Defer to PT evaluation        Vision       Perception Perception Perception: Not tested   Praxis Praxis Praxis: Not tested    Cognition Arousal/Alertness: Awake/alert Behavior During Therapy: WFL for tasks assessed/performed Overall Cognitive Status: Within Functional Limits for tasks assessed                                          Exercises General Exercises - Upper Extremity Shoulder Flexion: AROM, Both, 10 reps, Supine Elbow  Flexion: AROM, Both, 10 reps, Supine, Strengthening Elbow Extension: AROM, Both, 10 reps, Supine Digit Composite Flexion: AROM, Both, 10 reps, Supine Composite Extension: AROM, Both, 10 reps, Supine General Exercises - Lower Extremity Ankle Circles/Pumps: AROM, Both, 10 reps, Supine Heel Slides: AROM, Strengthening, Both, 5 reps, Supine Other Exercises Other Exercises: IS x5 reps    Shoulder Instructions       General Comments VSS on RA    Pertinent Vitals/ Pain       Pain Assessment Pain Assessment: No/denies pain  Home Living                                          Prior Functioning/Environment              Frequency  Min  2X/week        Progress Toward Goals  OT Goals(current goals can now be found in the care plan section)  Progress towards OT goals: Not progressing toward goals - comment (self limiting)  Acute Rehab OT Goals OT Goal Formulation: With patient Time For Goal Achievement: 02/28/22 Potential to Achieve Goals: Fair ADL Goals Pt Will Perform Grooming: with set-up;sitting Pt Will Perform Upper Body Dressing: with set-up;sitting Pt Will Transfer to Toilet: ambulating;bedside commode;with min assist Pt Will Perform Toileting - Clothing Manipulation and hygiene: with min assist;sit to/from stand Additional ADL Goal #1: Pt will perform bed mobility with min assist in preparation for ADLs.  Plan Discharge plan remains appropriate    Co-evaluation                 AM-PAC OT "6 Clicks" Daily Activity     Outcome Measure   Help from another person eating meals?: A Little Help from another person taking care of personal grooming?: A Little Help from another person toileting, which includes using toliet, bedpan, or urinal?: Total Help from another person bathing (including washing, rinsing, drying)?: A Lot Help from another person to put on and taking off regular upper body clothing?: A Lot Help from another person to put on and taking off regular lower body clothing?: Total 6 Click Score: 12    End of Session    OT Visit Diagnosis: Unsteadiness on feet (R26.81);Other abnormalities of gait and mobility (R26.89);Muscle weakness (generalized) (M62.81);Other (comment)   Activity Tolerance Patient limited by fatigue   Patient Left in bed;with call bell/phone within reach;with bed alarm set   Nurse Communication Mobility status        Time: 1355-1413 OT Time Calculation (min): 18 min  Charges: OT General Charges $OT Visit: 1 Visit OT Treatments $Therapeutic Exercise: 8-22 mins  Carver Fila, OTD, OTR/L SecureChat Preferred Acute Rehab [phone]   Carver Fila  [place] 02/24/2022, 4:03 PM

## 2022-02-24 NOTE — Progress Notes (Signed)
OT Cancellation Note  Patient Details Name: Charlotte Powell MRN: [id number] DOB: 1928/03/05   Cancelled Treatment:    Reason Eval/Treat Not Completed: Patient declined, no reason specified (pt asking therapy to return later in the PM, will follow up for OT tx as schedule permits)  Renaye Rakers, OTD, OTR/L SecureChat Preferred Acute Rehab [phone] - [place] 02/24/2022, 10:41 AM

## 2022-02-24 NOTE — Progress Notes (Signed)
Daily Progress Note   Patient Name: Charlotte Powell       Date: 02/24/2022 DOB: 06-01-1928  Age: 89 y.o. MRN#: [id number] Attending Physician: Jonetta Osgood, MD Primary Care Physician: Sandrea Hughs, NP Admit Date: 02/12/2022  Reason for Consultation/Follow-up: Establishing goals of care  Patient Profile/HPI:   89 y.o. female  with past medical history of CAD, HTN, HLD, pulm and A-fib on [facility] admitted on 02/12/2022 with weakness and malaise. Found to have afib RVR. Developed significant hypotension and bradycardia with CCB and BB. Now on CCB and amiodarone. Avoiding pacemaker. Also with ongoing failure to thrive. PMT consulted to discuss [place].     Subjective: Chart reviewed including labs, progress notes, imaging from this and previous encounters.  [name] shares she hasn't decided how she's feeling today when asked.  No pain, breathing ok.  I called her daughter for followup. [name] was at a doctor's appointment and unable to speak when I called.  I called again a few hours later- message left.   Vital Signs: BP (!) 118/47 (BP Location: Right Arm)   Pulse 64   Temp 98.3 F (36.8 C) (Oral)   Resp (!) 23   Ht 5\' 3"  (1.6 m)   Wt 67.2 kg   SpO2 95%   BMI 26.24 kg/m  SpO2: SpO2: 95 % O2 Device: O2 Device: Room Air O2 Flow Rate: O2 Flow Rate (L/min): 2 L/min  Intake/output summary:  Intake/Output Summary (Last 24 hours) at 02/24/2022 1423 Last data filed at 02/24/2022 1111 Gross per 24 hour  Intake 340 ml  Output 1075 ml  Net -735 ml   LBM: Last BM Date : 02/24/22 Baseline Weight: Weight: 62.1 kg Most recent weight: Weight: 67.2 kg       Palliative Assessment/Data: PPS: 30 %      Patient Active Problem List   Diagnosis Date Noted   Chronic atrial fibrillation with  RVR (HCC) 02/12/2022   Rotavirus infection 01/17/2022   Nausea, vomiting, and diarrhea 01/16/2022   Compression fracture of spine ([place]) 01/16/2022   Thrombocytopenia ([place]) 01/16/2022   Hypothyroidism 01/16/2022   Nausea vomiting and diarrhea 01/16/2022   Acute exacerbation of chronic low back pain 11/30/2021   Chronic left shoulder pain 11/30/2021   Obtundation 11/28/2021   Medication adverse effect 11/27/2021   PAF (paroxysmal  atrial fibrillation) (HCC) 04/22/2021   Chest pain of uncertain etiology 04/22/2021   CAD in native artery 04/22/2021   Chronic anticoagulation 04/22/2021   Atrial fibrillation with RVR (HCC) 04/14/2021   Left upper arm pain 04/14/2021   Secondary hypercoagulable state (HCC) 03/21/2021   Viral gastroenteritis 02/09/2021   DNR (do not resuscitate)/DNI(Do Not Intubate) 02/09/2021   HLD (hyperlipidemia) 02/09/2021   CAD (coronary artery disease) 02/09/2021   Persistent atrial fibrillation (HCC) 11/16/2020   HTN (hypertension) 11/16/2020    Palliative Care Assessment & Plan    Assessment/Recommendations/Plan  Await return call from patient's daughter  Addendum- noted TOC has discussed with daughter and plan is for d/c to ALF with hospice   Code Status: DNR  Prognosis:  < 6 months  Discharge Planning: Home with Hospice  Care plan was discussed with family and care team  Thank you for allowing the Palliative Medicine Team to assist in the care of this patient.  Total time:  45 minutes  Greater than 50%  of this time was spent counseling and coordinating care related to the above assessment and plan.  Ocie Bob, AGNP-C Palliative Medicine   Please contact Palliative Medicine Team phone at [phone] for questions and concerns.

## 2022-02-24 NOTE — Plan of Care (Signed)
  Problem: Education: Goal: Knowledge of disease or condition will improve Outcome: Progressing   Problem: Clinical Measurements: Goal: Cardiovascular complication will be avoided Outcome: Progressing   Problem: Clinical Measurements: Goal: Respiratory complications will improve Outcome: Not Applicable

## 2022-02-24 NOTE — TOC Progression Note (Signed)
Transition of Care [facility]) - Progression Note    Patient Details  Name: Charlotte Powell MRN: [id number] Date of Birth: April 05, 1928  Transition of Care [facility]) CM/SW [place], LCSW Phone Number: 02/24/2022, 4:07 PM  Clinical Narrative:    CSW met with pt at bedside along with daughter. Pt would like to return home [name] with hospice. Daughter informed CSW that she spoke with ALF regarding hospice agencies and would like to choose [facility]. Daughter also requested to have a hospital bed ordered for pt return to ALF at dc. TOC will continue to follow and assist should any additional needs arise.   Expected Discharge Plan: Assisted Living Barriers to Discharge: Continued Medical Work up  Expected Discharge Plan and Services In-house Referral: NA     Living arrangements for the past 2 months: [place]                   DME Agency: NA                   Social Determinants of Health (SDOH) Interventions [place]: No Food Insecurity (02/12/2022)  Housing: Low Risk  (02/12/2022)  Transportation Needs: No Transportation Needs (02/12/2022)  Utilities: Not At Risk (02/12/2022)  Tobacco Use: Low Risk  (02/12/2022)    Readmission Risk Interventions    02/18/2022    3:48 PM  Readmission Risk Prevention Plan  Transportation Screening Complete  Palliative Care Screening Not Applicable  Medication Review (RN Care Manager) Complete   Beckey Rutter, MSW, LCSWA, LCASA Transitions of Care  Clinical Social Worker I

## 2022-02-24 NOTE — Discharge Instructions (Signed)
*Please note that your Eliquis dose has been increased from 2.5mg  to 5mg  twice daily*  Information on my medicine - ELIQUIS (apixaban)  This medication education was reviewed with me or my healthcare representative as part of my discharge preparation.     Why was Eliquis prescribed for you? Eliquis was prescribed for you to reduce the risk of a blood clot forming that can cause a stroke if you have a medical condition called atrial fibrillation (a type of irregular heartbeat).  What do You need to know about Eliquis ? Take your Eliquis TWICE DAILY - one tablet in the morning and one tablet in the evening with or without food. If you have difficulty swallowing the tablet whole please discuss with your pharmacist how to take the medication safely.  Take Eliquis exactly as prescribed by your doctor and DO NOT stop taking Eliquis without talking to the doctor who prescribed the medication.  Stopping may increase your risk of developing a stroke.  Refill your prescription before you run out.  After discharge, you should have regular check-up appointments with your healthcare provider that is prescribing your Eliquis.  In the future your dose may need to be changed if your kidney function or weight changes by a significant amount or as you get older.  What do you do if you miss a dose? If you miss a dose, take it as soon as you remember on the same day and resume taking twice daily.  Do not take more than one dose of ELIQUIS at the same time to make up a missed dose.  Important Safety Information A possible side effect of Eliquis is bleeding. You should call your healthcare provider right away if you experience any of the following: Bleeding from an injury or your nose that does not stop. Unusual colored urine (red or dark brown) or unusual colored stools (red or black). Unusual bruising for unknown reasons. A serious fall or if you hit your head (even if there is no bleeding).  Some  medicines may interact with Eliquis and might increase your risk of bleeding or clotting while on Eliquis. To help avoid this, consult your healthcare provider or pharmacist prior to using any new prescription or non-prescription medications, including herbals, vitamins, non-steroidal anti-inflammatory drugs (NSAIDs) and supplements.  This website has more information on Eliquis (apixaban): http://www.eliquis.com/eliquis/home   =============================================================  Atrial Fibrillation    Atrial fibrillation is a type of heartbeat that is irregular or fast. If you have this condition, your heart beats without any order. This makes it hard for your heart to pump blood in a normal way. Atrial fibrillation may come and go, or it may become a long-lasting problem. If this condition is not treated, it can put you at higher risk for stroke, heart failure, and other heart problems.  What are the causes? This condition may be caused by diseases that damage the heart. They include: High blood pressure. Heart failure. Heart valve disease. Heart surgery. Other causes include: Diabetes. Thyroid disease. Being overweight. Kidney disease. Sometimes the cause is not known.  What increases the risk? You are more likely to develop this condition if: You are older. You smoke. You exercise often and very hard. You have a family history of this condition. You are a man. You use drugs. You drink a lot of alcohol. You have lung conditions, such as emphysema, pneumonia, or COPD. You have sleep apnea.  What are the signs or symptoms? Common symptoms of this condition include: A  feeling that your heart is beating very fast. Chest pain or discomfort. Feeling short of breath. Suddenly feeling light-headed or weak. Getting tired easily during activity. Fainting. Sweating. In some cases, there are no symptoms.  How is this treated? Treatment for this condition depends  on underlying conditions and how you feel when you have atrial fibrillation. They include: Medicines to: Prevent blood clots. Treat heart rate or heart rhythm problems. Using devices, such as a pacemaker, to correct heart rhythm problems. Doing surgery to remove the part of the heart that sends bad signals. Closing an area where clots can form in the heart (left atrial appendage). In some cases, your doctor will treat other underlying conditions.  Follow these instructions at home:  Medicines Take over-the-counter and prescription medicines only as told by your doctor. Do not take any new medicines without first talking to your doctor. If you are taking blood thinners: Talk with your doctor before you take any medicines that have aspirin or NSAIDs, such as ibuprofen, in them. Take your medicine exactly as told by your doctor. Take it at the same time each day. Avoid activities that could hurt or bruise you. Follow instructions about how to prevent falls. Wear a bracelet that says you are taking blood thinners. Or, carry a card that lists what medicines you take. Lifestyle         Do not use any products that have nicotine or tobacco in them. These include cigarettes, e-cigarettes, and chewing tobacco. If you need help quitting, ask your doctor. Eat heart-healthy foods. Talk with your doctor about the right eating plan for you. Exercise regularly as told by your doctor. Do not drink alcohol. Lose weight if you are overweight. Do not use drugs, including cannabis.  General instructions If you have a condition that causes breathing to stop for a short period of time (apnea), treat it as told by your doctor. Keep a healthy weight. Do not use diet pills unless your doctor says they are safe for you. Diet pills may make heart problems worse. Keep all follow-up visits as told by your doctor. This is important.  Contact a doctor if: You notice a change in the speed, rhythm, or strength  of your heartbeat. You are taking a blood-thinning medicine and you get more bruising. You get tired more easily when you move or exercise. You have a sudden change in weight.  Get help right away if:    You have pain in your chest or your belly (abdomen). You have trouble breathing. You have side effects of blood thinners, such as blood in your vomit, poop (stool), or pee (urine), or bleeding that cannot stop. You have any signs of a stroke. "BE FAST" is an easy way to remember the main warning signs: B - Balance. Signs are dizziness, sudden trouble walking, or loss of balance. E - Eyes. Signs are trouble seeing or a change in how you see. F - Face. Signs are sudden weakness or loss of feeling in the face, or the face or eyelid drooping on one side. A - Arms. Signs are weakness or loss of feeling in an arm. This happens suddenly and usually on one side of the body. S - Speech. Signs are sudden trouble speaking, slurred speech, or trouble understanding what people say. T - Time. Time to call emergency services. Write down what time symptoms started. You have other signs of a stroke, such as: A sudden, very bad headache with no known cause. Feeling like  you may vomit (nausea). Vomiting. A seizure.  These symptoms may be an emergency. Do not wait to see if the symptoms will go away. Get medical help right away. Call your local emergency services (911 in the [place] Do not drive yourself to the hospital. Summary Atrial fibrillation is a type of heartbeat that is irregular or fast. You are at higher risk of this condition if you smoke, are older, have diabetes, or are overweight. Follow your doctor's instructions about medicines, diet, exercise, and follow-up visits. Get help right away if you have signs or symptoms of a stroke. Get help right away if you cannot catch your breath, or you have chest pain or discomfort. This information is not intended to replace advice given to you by your  health care provider. Make sure you discuss any questions you have with your health care provider. Document Revised: 07/28/2018 Document Reviewed: 07/28/2018 Elsevier Patient Education  2020 [employer].

## 2022-02-24 NOTE — Progress Notes (Signed)
Progress Note  Patient Name: Charlotte Powell Date of Encounter: 02/24/2022  Primary Cardiologist: Godfrey Pick Tobb, DO  Subjective   No acute events overnight.  Patient denies symptoms. Heart rates 50s to 11teens. Blood pressures ok. On Ra. She states she is dry  Inpatient Medications    Scheduled Meds:  amiodarone  200 mg Oral BID   Followed by   Derrill Memo ON 03/15/2022] amiodarone  200 mg Oral Daily   apixaban  5 mg Oral BID   benzonatate  200 mg Oral TID   dextromethorphan  30 mg Oral BID   diltiazem  120 mg Oral Daily   docusate sodium  100 mg Oral BID   feeding supplement  1 Container Oral TID BM   fluticasone  1 spray Each Nare Daily   gabapentin  300 mg Oral QHS   guaiFENesin  600 mg Oral BID   levothyroxine  25 mcg Oral Daily   melatonin  5 mg Oral QHS   pantoprazole  40 mg Oral BID   polyethylene glycol  17 g Oral BID   rosuvastatin  20 mg Oral Daily   Continuous Infusions:   PRN Meds: acetaminophen **OR** acetaminophen, levalbuterol, menthol-cetylpyridinium, ondansetron **OR** ondansetron (ZOFRAN) IV, prochlorperazine   Vital Signs    Vitals:   02/24/22 0054 02/24/22 0549 02/24/22 0700 02/24/22 0703  BP: (!) 105/59 96/69 (!) 109/58 (!) 109/58  Pulse: (!) 103 (!) 59 (!) 103 (!) 117  Resp: 18 20  18   Temp: 98.4 F (36.9 C) (!) 97.4 F (36.3 C)  (!) 97.4 F (36.3 C)  TempSrc: Oral Oral  Oral  SpO2: 93% 95%  95%  Weight:  67.2 kg    Height:        Intake/Output Summary (Last 24 hours) at 02/24/2022 0852 Last data filed at 02/24/2022 0837 Gross per 24 hour  Intake 340 ml  Output 1475 ml  Net -1135 ml   Filed Weights   02/22/22 0404 02/23/22 0536 02/24/22 0549  Weight: 65.6 kg 64.9 kg 67.2 kg    Telemetry    Personally reviewed, A-fib rates 50s-117  ECG    Atrial fibrillation  Physical Exam   GEN: No acute distress.  Laying flat comfortably Neck: mild JVD. Cardiac: Irregular rate and rhythm, no murmur, rub, or gallop.  Respiratory: Nonlabored.  Clear to auscultation bilaterally. GI: Soft, nontender, bowel sounds present. MS: 1-2+ BL LE edema; No deformity. Neuro:  Nonfocal. Psych: Alert and oriented x 3. Normal affect.  Labs    Chemistry Recent Labs  Lab 02/20/22 0044 02/21/22 0044 02/22/22 0108  NA 134* 134* 133*  K 3.4* 4.5 3.8  CL 103 105 105  CO2 24 23 22   GLUCOSE 105* 132* 118*  BUN 15 10 8   CREATININE 0.93 0.84 0.74  CALCIUM 8.0* 8.2* 7.9*  GFRNONAA 58* >60 >60  ANIONGAP 7 6 6      Hematology Recent Labs  Lab 02/18/22 0104 02/18/22 0723 02/19/22 0037  WBC 6.3 6.3 6.9  RBC 2.25* 3.48* 3.53*  HGB 8.1* 12.3 12.1  HCT 22.9* 36.0 37.1  MCV 101.8* 103.4* 105.1*  MCH 36.0* 35.3* 34.3*  MCHC 35.4 34.2 32.6  RDW 14.0 14.1 14.4  PLT 120* 142* 156    Cardiac Enzymes Recent Labs  Lab 02/12/22 1257 02/12/22 1625 02/13/22 0554  TROPONINIHS 49* 47* 42*    BNPNo results for input(s): "BNP", "PROBNP" in the last 168 hours.   DDimerNo results for input(s): "DDIMER" in the last 168 hours.  Radiology    No results found.   Assessment & Plan    Patient is a 89 year old F known to have CAD, HTN, HLD, pulm and A-fib on [facility] admitted with weakness, malaise, decreased p.o. intake, possible pneumonia as well as A-fib with RVR.  Echo this admission showed LVEF 60 to 65%, mild RV enlargement, moderate LA enlargement, mild MR, moderate TR.  CTA showed no PE and small bilateral pleural effusions.  # Permanent A-fib with RVR - amiodarone was held with rates that declined, was 50 overnight. Still in her system with long half-life. Can continue amio -Continue Cardizem CD 120 mg once daily. -Continue amiodarone 200 mg twice daily for 3 weeks followed by amiodarone 200 mg once daily. -If her HR's are not very well-controlled in the next 24 to 48 hours, she might benefit from Cardizem dose increase. -Avoid BB with CCB due to significant hypotension and bradycardia (HR in 30s) during this admission daughter would like  to avoid pacemaker. Palliative planning -Continue Eliquis 5 mg twice daily - 1 x dose of lasix today with some edema. Can encourage fluids and nutrition + protein for fluid balance (albumin 2.5)  # CAD -No need of aspirin as she is on Eliquis  If maintains good heart rates between 50-120, will follow peripherally. Palliative planning underway  Time Spent Directly with Patient:  I have spent a total of 35 minutes with the patient reviewing hospital notes, telemetry, EKGs, labs and examining the patient as well as establishing an assessment and plan that was discussed personally with the patient.  > 50% of time was spent in direct patient care.    Signed, Maisie Fus, MD  02/24/2022, 8:52 AM

## 2022-02-24 NOTE — Progress Notes (Signed)
PROGRESS NOTE        PATIENT DETAILS Name: Charlotte Powell Age: 89 y.o. Sex: female Date of Birth: 1928-06-29 Admit Date: 02/12/2022 Admitting Physician Lavina Hamman, MD KNL:[id number], Nelda Bucks, NP  Brief Summary: Patient is a 89 y.o.  female with chronic atrial fibrillation, HTN, HLD, hypothyroidism-shortness of breath-patient was found to have A-fib RVR.  Significant events: 12/27>> admit to [facility]-A-fib RVR 01/03>> brief episode of hypotension-beta-blocker/Cardizem discontinued 01/04>> BP stabilized-remains in RVR-Cardizem started.  Significant studies: 12/28>> CTA chest: No PE, small bilateral pleural effusions. 01/03>> CT head: No acute intracranial abnormality.  Significant microbiology data: 12/27>> COVID/influenza/RSV PCR: Negative 12/27>> blood culture: No growth  Procedures: None  Consults: Cardiology Palliative care  Subjective: Heart rate still not optimal but overall improved.  Claims she has started to eat a little bit more than last few days.  Objective: Vitals: Blood pressure 106/60, pulse (!) 117, temperature (!) 97.4 F (36.3 C), temperature source Oral, resp. rate 18, height 5\' 3"  (1.6 m), weight 67.2 kg, SpO2 95 %.   Exam: Gen Exam:Alert awake-not in any distress HEENT:atraumatic, normocephalic Chest: B/L clear to auscultation anteriorly CVS:S1S2 regular Abdomen:soft non tender, non distended Extremities:no edema Neurology: Non focal Skin: no rash  Pertinent Labs/Radiology:    Latest Ref Rng & Units 02/19/2022   12:37 AM 02/18/2022    7:23 AM 02/18/2022    1:04 AM  CBC  WBC 4.0 - 10.5 K/uL 6.9  6.3  6.3   Hemoglobin 12.0 - 15.0 g/dL 12.1  12.3  8.1   Hematocrit 36.0 - 46.0 % 37.1  36.0  22.9   Platelets 150 - 400 K/uL 156  142  120     Lab Results  Component Value Date   NA 133 (L) 02/22/2022   K 3.8 02/22/2022   CL 105 02/22/2022   CO2 22 02/22/2022      Assessment/Plan: Chronic Afib with RVR Suspected  tachybradycardia syndrome Rate slowly improving  Cardiology following-on Cardizem/amiodarone.  . She likely has underlying tachybradycardia syndrome-and has gotten bradycardic/hypotensive with aggressive rate control strategy.  Plan is to allow some amount of permissive RVR. Family not interested in pursuing PPM Continue Eliquis  Hypotension Occurred on 1/3-following administration of beta-blocker/Cardizem No evidence of sepsis/volume loss BP has now stabilized-responded to supportive care including gentle IV fluid bolus.    Failure to thrive syndrome Continues to have generalized weakness/-although appetite issues seems to be slowly improving. Unfortunately-do not see any reversible causes at this point Needs to continue to work with PT/OT Prior MD tried Remeron but apparently caused sedation Liberalize diet-encourage use of nutritional supplements.  Abdominal pain Soft belly-benign exam Likely from cough X-ray abdomen negative Supportive care  Peripheral neuropathy Continue Neurontin  Hypothyroidism Resume Synthroid   HLD Continue statin  Hypokalemia Repleted  Hypomagnesemia Continue supplementation  Hyponatremia Mild-of no clinical significance  Palliative care DNR in place Continues to have failure to thrive syndrome-continues to be quite challenging for rate control of A-fib given underlying tachybradycardia syndrome.  Family does not desire PPM placement. Palliative care consulted for further goals of care discussion.  BMI: Estimated body mass index is 26.24 kg/m as calculated from the following:   Height as of this encounter: 5\' 3"  (1.6 m).   Weight as of this encounter: 67.2 kg.   Code status:   Code Status: DNR   DVT Prophylaxis:  apixaban (ELIQUIS) tablet 5 mg    Family Communication:  Daughter-Sandra Simpkins [phone] updated over the phone 1/7   Disposition Plan: Status is: Inpatient Remains inpatient appropriate because: Severity of  illness   Planned Discharge Destination: ALF with home health versus SNF-TOC team consulted on 1/5 to engage family.   Diet: Diet Order             Diet regular Room service appropriate? Yes; Fluid consistency: Thin  Diet effective now                     Antimicrobial agents: Anti-infectives (From admission, onward)    None        MEDICATIONS: Scheduled Meds:  amiodarone  200 mg Oral BID   Followed by   Melene Muller ON 03/15/2022] amiodarone  200 mg Oral Daily   apixaban  5 mg Oral BID   benzonatate  200 mg Oral TID   dextromethorphan  30 mg Oral BID   diltiazem  120 mg Oral Daily   docusate sodium  100 mg Oral BID   feeding supplement  1 Container Oral TID BM   fluticasone  1 spray Each Nare Daily   furosemide  20 mg Intravenous Once   gabapentin  300 mg Oral QHS   guaiFENesin  600 mg Oral BID   levothyroxine  25 mcg Oral Daily   melatonin  5 mg Oral QHS   pantoprazole  40 mg Oral BID   polyethylene glycol  17 g Oral BID   rosuvastatin  20 mg Oral Daily   Continuous Infusions: PRN Meds:.acetaminophen **OR** acetaminophen, levalbuterol, menthol-cetylpyridinium, ondansetron **OR** ondansetron (ZOFRAN) IV, prochlorperazine   I have personally reviewed following labs and imaging studies  LABORATORY DATA: CBC: Recent Labs  Lab 02/18/22 0104 02/18/22 0723 02/19/22 0037  WBC 6.3 6.3 6.9  HGB 8.1* 12.3 12.1  HCT 22.9* 36.0 37.1  MCV 101.8* 103.4* 105.1*  PLT 120* 142* 156     Basic Metabolic Panel: Recent Labs  Lab 02/19/22 0037 02/20/22 0044 02/21/22 0044 02/22/22 0108  NA 134* 134* 134* 133*  K 3.3* 3.4* 4.5 3.8  CL 101 103 105 105  CO2 22 24 23 22   GLUCOSE 105* 105* 132* 118*  BUN 14 15 10 8   CREATININE 0.93 0.93 0.84 0.74  CALCIUM 8.2* 8.0* 8.2* 7.9*  MG  --   --  1.6* 1.8     GFR: Estimated Creatinine Clearance: 42.2 mL/min (by C-G formula based on SCr of 0.74 mg/dL).  Liver Function Tests: No results for input(s): "AST", "ALT",  "ALKPHOS", "BILITOT", "PROT", "ALBUMIN" in the last 168 hours.  No results for input(s): "LIPASE", "AMYLASE" in the last 168 hours.  No results for input(s): "AMMONIA" in the last 168 hours.  Coagulation Profile: No results for input(s): "INR", "PROTIME" in the last 168 hours.  Cardiac Enzymes: No results for input(s): "CKTOTAL", "CKMB", "CKMBINDEX", "TROPONINI" in the last 168 hours.  BNP (last 3 results) No results for input(s): "PROBNP" in the last 8760 hours.  Lipid Profile: No results for input(s): "CHOL", "HDL", "LDLCALC", "TRIG", "CHOLHDL", "LDLDIRECT" in the last 72 hours.  Thyroid Function Tests: No results for input(s): "TSH", "T4TOTAL", "FREET4", "T3FREE", "THYROIDAB" in the last 72 hours.  Anemia Panel: No results for input(s): "VITAMINB12", "FOLATE", "FERRITIN", "TIBC", "IRON", "RETICCTPCT" in the last 72 hours.  Urine analysis:    Component Value Date/Time   COLORURINE AMBER (A) 02/12/2022 1523   APPEARANCEUR HAZY (A) 02/12/2022 1523   LABSPEC 1.030  02/12/2022 1523   PHURINE 5.0 02/12/2022 1523   GLUCOSEU NEGATIVE 02/12/2022 1523   HGBUR NEGATIVE 02/12/2022 1523   BILIRUBINUR NEGATIVE 02/12/2022 1523   KETONESUR NEGATIVE 02/12/2022 1523   PROTEINUR 100 (A) 02/12/2022 1523   NITRITE NEGATIVE 02/12/2022 1523   LEUKOCYTESUR NEGATIVE 02/12/2022 1523    Sepsis Labs: Lactic Acid, Venous    Component Value Date/Time   LATICACIDVEN 1.3 02/13/2022 0222    MICROBIOLOGY: No results found for this or any previous visit (from the past 240 hour(s)).   RADIOLOGY STUDIES/RESULTS: No results found.   LOS: 11 days   Jeoffrey Massed, MD  [facility]    To contact the attending provider between 7A-7P or the covering provider during after hours 7P-7A, please log into the web site [email or web address] and access using universal [place] password for that web site. If you do not have the password, please call the hospital operator.  02/24/2022, 10:19 AM

## 2022-02-24 NOTE — Plan of Care (Signed)
  Problem: Education: Goal: Knowledge of disease or condition will improve Outcome: Progressing Goal: Understanding of medication regimen will improve Outcome: Progressing Goal: Individualized Educational Video(s) Outcome: Progressing   Problem: Activity: Goal: Ability to tolerate increased activity will improve Outcome: Progressing   Problem: Cardiac: Goal: Ability to achieve and maintain adequate cardiopulmonary perfusion will improve Outcome: Progressing   Problem: Health Behavior/Discharge Planning: Goal: Ability to safely manage health-related needs after discharge will improve Outcome: Progressing   Problem: Education: Goal: Knowledge of General Education information will improve Description: Including pain rating scale, medication(s)/side effects and non-pharmacologic comfort measures Outcome: Progressing   Problem: Health Behavior/Discharge Planning: Goal: Ability to manage health-related needs will improve Outcome: Progressing   Problem: Clinical Measurements: Goal: Ability to maintain clinical measurements within normal limits will improve Outcome: Progressing Goal: Diagnostic test results will improve Outcome: Progressing Goal: Cardiovascular complication will be avoided Outcome: Progressing   Problem: Activity: Goal: Risk for activity intolerance will decrease Outcome: Progressing   Problem: Nutrition: Goal: Adequate nutrition will be maintained Outcome: Progressing   Problem: Coping: Goal: Level of anxiety will decrease Outcome: Progressing   Problem: Elimination: Goal: Will not experience complications related to bowel motility Outcome: Progressing Goal: Will not experience complications related to urinary retention Outcome: Progressing   Problem: Safety: Goal: Ability to remain free from injury will improve Outcome: Progressing   Problem: Skin Integrity: Goal: Risk for impaired skin integrity will decrease Outcome: Progressing

## 2022-02-25 DIAGNOSIS — I4821 Permanent atrial fibrillation: Secondary | ICD-10-CM | POA: Diagnosis not present

## 2022-02-25 DIAGNOSIS — I482 Chronic atrial fibrillation, unspecified: Secondary | ICD-10-CM | POA: Diagnosis not present

## 2022-02-25 DIAGNOSIS — I2510 Atherosclerotic heart disease of native coronary artery without angina pectoris: Secondary | ICD-10-CM | POA: Diagnosis not present

## 2022-02-25 NOTE — TOC Progression Note (Signed)
Transition of Care [facility]) - Progression Note    Patient Details  Name: Charlotte Powell MRN: [id number] Date of Birth: October 23, 1928  Transition of Care [facility]) CM/SW [place], LCSW Phone Number: 02/25/2022, 12:43 PM  Clinical Narrative:    CSW spoke with [facility] to set up hospice for pt at ALF. CSW communicated the need for DME for pt. [facility] stated that they would provide a hospital bed, table and APP. CSW confirmed that DME will be able to be delievered to ALF tomorrow when pt dc. CSW contacted pt daughter [name] to provide update with hospice services upon dc tomorrow. TOC will continue to follow.    Expected Discharge Plan: Assisted Living Barriers to Discharge: Continued Medical Work up  Expected Discharge Plan and Services In-house Referral: NA     Living arrangements for the past 2 months: [place]                   DME Agency: NA                   Social Determinants of Health (SDOH) Interventions [place]: No Food Insecurity (02/12/2022)  Housing: Low Risk  (02/12/2022)  Transportation Needs: No Transportation Needs (02/12/2022)  Utilities: Not At Risk (02/12/2022)  Tobacco Use: Low Risk  (02/12/2022)    Readmission Risk Interventions    02/18/2022    3:48 PM  Readmission Risk Prevention Plan  Transportation Screening Complete  Palliative Care Screening Not Applicable  Medication Review (RN Care Manager) Complete   Beckey Rutter, MSW, LCSWA, LCASA Transitions of Care  Clinical Social Worker I

## 2022-02-25 NOTE — Progress Notes (Signed)
Physical Therapy Treatment Patient Details Name: Charlotte Powell MRN: [id number] DOB: 1928-12-31 Today's Date: 02/25/2022   History of Present Illness Pt is a 89 year old woman who presented to [facility] on 02/12/22 from [place] with SOB, nausea and chest pain with afib/RVR. Pt with recent admission for rotovirus and reportedly has not regained her strength since. PMH: afib, CAD, GERD, HTN, HLD, Hypothyroidism, neuropathy, compression fractures.    PT Comments    Pt received ROM to both legs due to limited effort tolerated today.  Her plan is to move to ALF in near future, and will need help for all mobility to go, but for now is exerting some effort to move legs.  Will continue to encourage movement to get transfers and strengthening done as tolerated.  Pt is able to do no more than this currently, and encourage her to get up to chair as tolerated.  Follow along for ROM as acute goals of PT permits, with expectation that pt will have HHPT follow up as needed and will have nursing care onsite.  Recommendations for follow up therapy are one component of a multi-disciplinary discharge planning process, led by the attending physician.  Recommendations may be updated based on patient status, additional functional criteria and insurance authorization.  Follow Up Recommendations  Home health PT     Assistance Recommended at Discharge Intermittent Supervision/Assistance  Patient can return home with the following Two people to help with walking and/or transfers;A lot of help with bathing/dressing/bathroom;Assistance with cooking/housework;Direct supervision/assist for medications management;Direct supervision/assist for financial management;Assist for transportation   Equipment Recommendations  None recommended by PT    Recommendations for Other Services       Precautions / Restrictions Precautions Precautions: Fall Precaution Comments: watch HR and BP Restrictions Weight Bearing  Restrictions: No     Mobility  Bed Mobility               General bed mobility comments: up in chair    Transfers                   General transfer comment: declining    Ambulation/Gait                   Stairs             Wheelchair Mobility    Modified Rankin (Stroke Patients Only)       Balance Overall balance assessment: Needs assistance Sitting-balance support: Feet supported Sitting balance-Leahy Scale: Fair Sitting balance - Comments: fair in chair but has minimal contact with chair                                    Cognition Arousal/Alertness: Awake/alert Behavior During Therapy: WFL for tasks assessed/performed Overall Cognitive Status: Within Functional Limits for tasks assessed                                          Exercises General Exercises - Lower Extremity Ankle Circles/Pumps: AROM, 5 reps Long Arc Quad: AROM, 10 reps Heel Slides: AROM, 10 reps Hip ABduction/ADduction: AROM, 10 reps Hip Flexion/Marching: AROM, 10 reps Other Exercises Other Exercises: c-spine SB and flexion extension    General Comments General comments (skin integrity, edema, etc.): pt is up in chair and noted sats were controlled with  all effort, HR was 58-61      Pertinent Vitals/Pain Pain Assessment Pain Assessment: Faces Faces Pain Scale: Hurts a little bit Pain Location: general soreness and located on neck and shoulders as well Pain Descriptors / Indicators: Guarding Pain Intervention(s): Monitored during session, Repositioned    Home Living                          Prior Function            PT Goals (current goals can now be found in the care plan section) Acute Rehab PT Goals Patient Stated Goal: to feel better and stronger Progress towards PT goals: Not progressing toward goals - comment (chair level visit)    Frequency    Min 3X/week      PT Plan Current plan remains  appropriate    Co-evaluation              AM-PAC PT "6 Clicks" Mobility   Outcome Measure  Help needed turning from your back to your side while in a flat bed without using bedrails?: A Little Help needed moving from lying on your back to sitting on the side of a flat bed without using bedrails?: A Little Help needed moving to and from a bed to a chair (including a wheelchair)?: A Lot Help needed standing up from a chair using your arms (e.g., wheelchair or bedside chair)?: Total Help needed to walk in hospital room?: Total Help needed climbing 3-5 steps with a railing? : Total 6 Click Score: 11    End of Session   Activity Tolerance: Patient limited by fatigue;Patient limited by pain Patient left: in chair;with call bell/phone within reach;with chair alarm set Nurse Communication: Mobility status PT Visit Diagnosis: Other abnormalities of gait and mobility (R26.89);Muscle weakness (generalized) (M62.81)     Time: 3010-4045 PT Time Calculation (min) (ACUTE ONLY): 23 min  Charges:  $Therapeutic Exercise: 23-37 mins     Ivar Drape 02/25/2022, 3:51 PM  Samul Dada, PT PhD Acute Rehab Dept. Number: [facility] [id number] and [facility] [phone]

## 2022-02-25 NOTE — Progress Notes (Addendum)
Progress Note  Patient Name: Charlotte Powell Date of Encounter: 02/25/2022  Primary Cardiologist: Godfrey Pick Tobb, DO  Subjective   No acute events overnight.  She is asymptomatic on RA. Daughter at the bedside. No new labs  Inpatient Medications    Scheduled Meds:  amiodarone  200 mg Oral BID   Followed by   Derrill Memo ON 03/15/2022] amiodarone  200 mg Oral Daily   apixaban  5 mg Oral BID   benzonatate  200 mg Oral TID   dextromethorphan  30 mg Oral BID   diltiazem  120 mg Oral Daily   docusate sodium  100 mg Oral BID   feeding supplement  1 Container Oral TID BM   fluticasone  1 spray Each Nare Daily   gabapentin  300 mg Oral QHS   guaiFENesin  600 mg Oral BID   levothyroxine  25 mcg Oral Daily   melatonin  5 mg Oral QHS   pantoprazole  40 mg Oral BID   polyethylene glycol  17 g Oral BID   rosuvastatin  20 mg Oral Daily   Continuous Infusions:   PRN Meds: acetaminophen **OR** acetaminophen, levalbuterol, menthol-cetylpyridinium, ondansetron **OR** ondansetron (ZOFRAN) IV, prochlorperazine   Vital Signs    Vitals:   02/25/22 0005 02/25/22 0423 02/25/22 0745 02/25/22 0916  BP: 111/60 (!) 127/58 93/77 108/61  Pulse: 63 72 98   Resp: 18 17 18 18   Temp: 97.7 F (36.5 C) 97.6 F (36.4 C) 97.8 F (36.6 C)   TempSrc: Oral Oral Oral   SpO2: 97% 98% 97% 99%  Weight:  63.9 kg    Height:        Intake/Output Summary (Last 24 hours) at 02/25/2022 0954 Last data filed at 02/25/2022 0743 Gross per 24 hour  Intake 360 ml  Output 2100 ml  Net -1740 ml   Filed Weights   02/23/22 0536 02/24/22 0549 02/25/22 0423  Weight: 64.9 kg 67.2 kg 63.9 kg    Telemetry    Personally reviewed, A-fib rates 50s-117  ECG    Atrial fibrillation  Physical Exam   GEN: No acute distress.  Laying flat comfortably Neck: (-) JVD. Cardiac: Irregular rate and rhythm, no murmur, rub, or gallop.  Respiratory: Nonlabored. Clear to auscultation bilaterally. GI: Soft, nontender, bowel sounds  present. MS:No deformity. Neuro:  Nonfocal. Psych: Alert and oriented x 3. Normal affect.  Labs    Chemistry Recent Labs  Lab 02/20/22 0044 02/21/22 0044 02/22/22 0108  NA 134* 134* 133*  K 3.4* 4.5 3.8  CL 103 105 105  CO2 24 23 22   GLUCOSE 105* 132* 118*  BUN 15 10 8   CREATININE 0.93 0.84 0.74  CALCIUM 8.0* 8.2* 7.9*  GFRNONAA 58* >60 >60  ANIONGAP 7 6 6      Hematology Recent Labs  Lab 02/19/22 0037  WBC 6.9  RBC 3.53*  HGB 12.1  HCT 37.1  MCV 105.1*  MCH 34.3*  MCHC 32.6  RDW 14.4  PLT 156    Cardiac Enzymes Recent Labs  Lab 02/12/22 1257 02/12/22 1625 02/13/22 0554  TROPONINIHS 49* 47* 42*    BNPNo results for input(s): "BNP", "PROBNP" in the last 168 hours.   DDimerNo results for input(s): "DDIMER" in the last 168 hours.   Radiology    No results found.   Assessment & Plan    Patient is a 89 year old F known to have CAD, HTN, HLD, pulm and A-fib on [facility] admitted with weakness, malaise, decreased p.o. intake, possible pneumonia as  well as A-fib with RVR.  Echo this admission showed LVEF 60 to 65%, mild RV enlargement, moderate LA enlargement, mild MR, moderate TR.  CTA showed no PE and small bilateral pleural effusions.  # Permanent A-fib with RVR - amiodarone was held with rates that declined, was 50 overnight. Still in her system with long half-life. Can continue amio -Continue Cardizem CD 120 mg once daily. -Continue amiodarone 200 mg twice daily for 3 weeks followed by amiodarone 200 mg once daily. - allow for permissive RVR up to 120s, as long as blood pressures are stable and she is asymptomatic. Recommend putting this in the DC summary -Avoid BB with CCB due to significant hypotension and bradycardia (HR in 30s) during this admission daughter would like to avoid pacemaker. Palliative planning -Continue Eliquis 5 mg twice daily  # CAD -No need of aspirin as she is on Eliquis  Palliative planning underway. Cardiology will sign  off.  Time Spent Directly with Patient:  I have spent a total of 35 minutes with the patient reviewing hospital notes, telemetry, EKGs, labs and examining the patient as well as establishing an assessment and plan that was discussed personally with the patient.  > 50% of time was spent in direct patient care.    Signed, Maisie Fus, MD  02/25/2022, 9:54 AM

## 2022-02-25 NOTE — Progress Notes (Signed)
PROGRESS NOTE        PATIENT DETAILS Name: Charlotte Powell Age: 89 y.o. Sex: female Date of Birth: 30-Dec-1928 Admit Date: 02/12/2022 Admitting Physician Rolly Salter, MD XTG:GYIRSWN, Donalee Citrin, NP  Brief Summary: Patient is a 88 y.o.  female with chronic atrial fibrillation, HTN, HLD, hypothyroidism-shortness of breath-patient was found to have A-fib RVR.  Significant events: 12/27>> admit to [facility]-A-fib RVR 01/03>> brief episode of hypotension-beta-blocker/Cardizem discontinued 01/04>> BP stabilized-remains in RVR-Cardizem started.  Significant studies: 12/28>> CTA chest: No PE, small bilateral pleural effusions. 01/03>> CT head: No acute intracranial abnormality.  Significant microbiology data: 12/27>> COVID/influenza/RSV PCR: Negative 12/27>> blood culture: No growth  Procedures: None  Consults: Cardiology Palliative care  Subjective: No major issues overnight.  Lying comfortably in bed.  Claims she is eating what she can.  Objective: Vitals: Blood pressure 108/61, pulse 98, temperature 97.8 F (36.6 C), temperature source Oral, resp. rate 18, height 5\' 3"  (1.6 m), weight 63.9 kg, SpO2 99 %.   Exam: Gen Exam:Alert awake-not in any distress HEENT:atraumatic, normocephalic Chest: B/L clear to auscultation anteriorly CVS:S1S2 regular Abdomen:soft non tender, non distended Extremities:no edema Neurology: Non focal Skin: no rash  Pertinent Labs/Radiology:    Latest Ref Rng & Units 02/19/2022   12:37 AM 02/18/2022    7:23 AM 02/18/2022    1:04 AM  CBC  WBC 4.0 - 10.5 K/uL 6.9  6.3  6.3   Hemoglobin 12.0 - 15.0 g/dL 04/19/2022  46.2  8.1   Hematocrit 36.0 - 46.0 % 37.1  36.0  22.9   Platelets 150 - 400 K/uL 156  142  120     Lab Results  Component Value Date   NA 133 (L) 02/22/2022   K 3.8 02/22/2022   CL 105 02/22/2022   CO2 22 02/22/2022      Assessment/Plan: Chronic Afib with RVR Suspected tachybradycardia syndrome Rate slowly  improving  Cardiology following-on Cardizem/amiodarone.  . She likely has underlying tachybradycardia syndrome-and has gotten bradycardic/hypotensive with aggressive rate control strategy.  Plan is to allow some amount of permissive RVR. Family not interested in pursuing PPM Continue Eliquis  Hypotension Occurred on 1/3-following administration of beta-blocker/Cardizem No evidence of sepsis/volume loss BP has now stabilized-responded to supportive care including gentle IV fluid bolus.    Failure to thrive syndrome Continues to have generalized weakness/-although appetite issues seems to be slowly improving. Unfortunately-do not see any reversible causes at this point Needs to continue to work with PT/OT Prior MD tried Remeron but apparently caused sedation Liberalize diet-encourage use of nutritional supplements.  Abdominal pain Soft belly-benign exam Likely from cough X-ray abdomen negative Supportive care  Peripheral neuropathy Continue Neurontin  Hypothyroidism Resume Synthroid   HLD Continue statin  Hypokalemia Repleted  Hypomagnesemia Continue supplementation  Hyponatremia Mild-of no clinical significance  Palliative care DNR in place Palliative care following-plan is now to discharge back to ALF with hospice. DME being delivered Continues to have failure to thrive syndrome and erratic oral intake.  BMI: Estimated body mass index is 24.95 kg/m as calculated from the following:   Height as of this encounter: 5\' 3"  (1.6 m).   Weight as of this encounter: 63.9 kg.   Code status:   Code Status: DNR   DVT Prophylaxis: apixaban (ELIQUIS) tablet 5 mg    Family Communication:  Daughter-Sandra Simpkins [phone] updated over the phone  1/7   Disposition Plan: Status is: Inpatient Remains inpatient appropriate because: Severity of illness   Planned Discharge Destination: ALF with home health versus SNF-TOC team consulted on 1/5 to engage  family.   Diet: Diet Order             Diet regular Room service appropriate? Yes; Fluid consistency: Thin  Diet effective now                     Antimicrobial agents: Anti-infectives (From admission, onward)    None        MEDICATIONS: Scheduled Meds:  amiodarone  200 mg Oral BID   Followed by   Derrill Memo ON 03/15/2022] amiodarone  200 mg Oral Daily   apixaban  5 mg Oral BID   benzonatate  200 mg Oral TID   dextromethorphan  30 mg Oral BID   diltiazem  120 mg Oral Daily   docusate sodium  100 mg Oral BID   feeding supplement  1 Container Oral TID BM   fluticasone  1 spray Each Nare Daily   gabapentin  300 mg Oral QHS   guaiFENesin  600 mg Oral BID   levothyroxine  25 mcg Oral Daily   melatonin  5 mg Oral QHS   pantoprazole  40 mg Oral BID   polyethylene glycol  17 g Oral BID   rosuvastatin  20 mg Oral Daily   Continuous Infusions: PRN Meds:.acetaminophen **OR** acetaminophen, levalbuterol, menthol-cetylpyridinium, ondansetron **OR** ondansetron (ZOFRAN) IV, prochlorperazine   I have personally reviewed following labs and imaging studies  LABORATORY DATA: CBC: Recent Labs  Lab 02/19/22 0037  WBC 6.9  HGB 12.1  HCT 37.1  MCV 105.1*  PLT 156     Basic Metabolic Panel: Recent Labs  Lab 02/19/22 0037 02/20/22 0044 02/21/22 0044 02/22/22 0108  NA 134* 134* 134* 133*  K 3.3* 3.4* 4.5 3.8  CL 101 103 105 105  CO2 22 24 23 22   GLUCOSE 105* 105* 132* 118*  BUN 14 15 10 8   CREATININE 0.93 0.93 0.84 0.74  CALCIUM 8.2* 8.0* 8.2* 7.9*  MG  --   --  1.6* 1.8     GFR: Estimated Creatinine Clearance: 41.2 mL/min (by C-G formula based on SCr of 0.74 mg/dL).  Liver Function Tests: No results for input(s): "AST", "ALT", "ALKPHOS", "BILITOT", "PROT", "ALBUMIN" in the last 168 hours.  No results for input(s): "LIPASE", "AMYLASE" in the last 168 hours.  No results for input(s): "AMMONIA" in the last 168 hours.  Coagulation Profile: No results for  input(s): "INR", "PROTIME" in the last 168 hours.  Cardiac Enzymes: No results for input(s): "CKTOTAL", "CKMB", "CKMBINDEX", "TROPONINI" in the last 168 hours.  BNP (last 3 results) No results for input(s): "PROBNP" in the last 8760 hours.  Lipid Profile: No results for input(s): "CHOL", "HDL", "LDLCALC", "TRIG", "CHOLHDL", "LDLDIRECT" in the last 72 hours.  Thyroid Function Tests: No results for input(s): "TSH", "T4TOTAL", "FREET4", "T3FREE", "THYROIDAB" in the last 72 hours.  Anemia Panel: No results for input(s): "VITAMINB12", "FOLATE", "FERRITIN", "TIBC", "IRON", "RETICCTPCT" in the last 72 hours.  Urine analysis:    Component Value Date/Time   COLORURINE AMBER (A) 02/12/2022 1523   APPEARANCEUR HAZY (A) 02/12/2022 1523   LABSPEC 1.030 02/12/2022 1523   PHURINE 5.0 02/12/2022 1523   GLUCOSEU NEGATIVE 02/12/2022 1523   HGBUR NEGATIVE 02/12/2022 1523   BILIRUBINUR NEGATIVE 02/12/2022 1523   KETONESUR NEGATIVE 02/12/2022 1523   PROTEINUR 100 (A) 02/12/2022 1523  NITRITE NEGATIVE 02/12/2022 1523   LEUKOCYTESUR NEGATIVE 02/12/2022 1523    Sepsis Labs: Lactic Acid, Venous    Component Value Date/Time   LATICACIDVEN 1.3 02/13/2022 0222    MICROBIOLOGY: No results found for this or any previous visit (from the past 240 hour(s)).   RADIOLOGY STUDIES/RESULTS: No results found.   LOS: 12 days   Oren Binet, MD  [facility]    To contact the attending provider between 7A-7P or the covering provider during after hours 7P-7A, please log into the web site [email or web address] and access using universal [place] password for that web site. If you do not have the password, please call the hospital operator.  02/25/2022, 10:33 AM

## 2022-02-25 NOTE — Progress Notes (Signed)
Messaged Chen, DO about pt's amiodarone as pt's HR had been in the 50s since the beginning of the shift. Pt's hr now in the 80s-90s and has been for 20 miuntes. Bridgett Larsson, DO said to go ahead and give the amiodarone. Pt is resting comfortably in bed, VSS and without complaint. Call bell is in reach.

## 2022-02-25 NOTE — NC FL2 (Signed)
[place] LEVEL OF CARE FORM     IDENTIFICATION  Patient Name: Charlotte Powell Birthdate: 18-Feb-1928 Sex: female Admission Date (Current Location): 02/12/2022  [facility] and [place] Number:  Herbalist and Address:  [facility] [place], [place] [address], [place], [place] [address]      Provider Number: [id number]  Attending Physician Name and Address:  Jonetta Osgood, MD  Relative Name and Phone Number:       Current Level of Care: Hospital Recommended Level of Care: [place] Prior Approval Number:    Date Approved/Denied:   PASRR Number: [id number]  Discharge Plan: Other (Comment) (ALF)    Current Diagnoses: Patient Active Problem List   Diagnosis Date Noted   Chronic atrial fibrillation with RVR ([place]) 02/12/2022   Rotavirus infection 01/17/2022   Nausea, vomiting, and diarrhea 01/16/2022   Compression fracture of spine ([place]) 01/16/2022   Thrombocytopenia ([place]) 01/16/2022   Hypothyroidism 01/16/2022   Nausea vomiting and diarrhea 01/16/2022   Acute exacerbation of chronic low back pain 11/30/2021   Chronic left shoulder pain 11/30/2021   Obtundation 11/28/2021   Medication adverse effect 11/27/2021   PAF (paroxysmal atrial fibrillation) ([place]) 04/22/2021   Chest pain of uncertain etiology 75/11/2583   CAD in native artery 04/22/2021   Chronic anticoagulation 04/22/2021   Atrial fibrillation with RVR ([place]) 04/14/2021   Left upper arm pain 04/14/2021   Secondary hypercoagulable state ([place]) 03/21/2021   Viral gastroenteritis 02/09/2021   DNR (do not resuscitate)/DNI(Do Not Intubate) 02/09/2021   HLD (hyperlipidemia) 02/09/2021   CAD (coronary artery disease) 02/09/2021   Persistent atrial fibrillation ([place]) 11/16/2020   HTN (hypertension) 11/16/2020    Orientation RESPIRATION BLADDER Height & Weight     Self, Time, Situation, Place  Normal Continent, External catheter Weight: 140 lb 14 oz (63.9  kg) Height:  5\' 3"  (160 cm)  BEHAVIORAL SYMPTOMS/MOOD NEUROLOGICAL BOWEL NUTRITION STATUS      Continent Diet (See dc summary)  AMBULATORY STATUS COMMUNICATION OF NEEDS Skin   Extensive Assist Verbally Normal                       Personal Care Assistance Level of Assistance  Bathing, Feeding, Dressing Bathing Assistance: Maximum assistance Feeding assistance: Limited assistance Dressing Assistance: Maximum assistance     Functional Limitations Info  Sight, Hearing, Speech Sight Info: Impaired Hearing Info: Impaired Speech Info: Adequate    SPECIAL CARE FACTORS FREQUENCY                       Contractures Contractures Info: Not present    Additional Factors Info  Code Status, Allergies Code Status Info: DNR Allergies Info: Baclofen, Amoxicillin and Hydrocodone           Current Medications (02/25/2022):  This is the current hospital active medication list Current Facility-Administered Medications  Medication Dose Route Frequency Provider Last Rate Last Admin   acetaminophen (TYLENOL) tablet 650 mg  650 mg Oral Q6H PRN Lavina Hamman, MD   650 mg at 02/23/22 2128   Or   acetaminophen (TYLENOL) suppository 650 mg  650 mg Rectal Q6H PRN Lavina Hamman, MD       amiodarone (PACERONE) tablet 200 mg  200 mg Oral BID Jonetta Osgood, MD   200 mg at 02/25/22 2778   Followed by   Derrill Memo ON 03/15/2022] amiodarone (PACERONE) tablet 200 mg  200 mg Oral Daily Ghimire, Shanker  M, MD       apixaban Everlene Balls) tablet 5 mg  5 mg Oral BID Lewayne Bunting, MD   5 mg at 02/25/22 2130   benzonatate (TESSALON) capsule 200 mg  200 mg Oral TID Rolly Salter, MD   200 mg at 02/25/22 8657   dextromethorphan (DELSYM) 30 MG/5ML liquid 30 mg  30 mg Oral BID Rolly Salter, MD   30 mg at 02/24/22 2120   diltiazem (CARDIZEM CD) 24 hr capsule 120 mg  120 mg Oral Daily Lewayne Bunting, MD   120 mg at 02/25/22 0919   docusate sodium (COLACE) capsule 100 mg  100 mg Oral BID Rolly Salter, MD   100 mg at 02/24/22 2121   feeding supplement (BOOST / RESOURCE BREEZE) liquid 1 Container  1 Container Oral TID BM Elgergawy, Leana Roe, MD   1 Container at 02/25/22 0921   fluticasone (FLONASE) 50 MCG/ACT nasal spray 1 spray  1 spray Each Nare Daily Rolly Salter, MD   1 spray at 02/25/22 0921   gabapentin (NEURONTIN) capsule 300 mg  300 mg Oral QHS Rolly Salter, MD   300 mg at 02/24/22 2120   guaiFENesin (MUCINEX) 12 hr tablet 600 mg  600 mg Oral BID Rolly Salter, MD   600 mg at 02/25/22 8469   levalbuterol (XOPENEX) nebulizer solution 0.63 mg  0.63 mg Nebulization Q6H PRN Rolly Salter, MD   0.63 mg at 02/14/22 6295   levothyroxine (SYNTHROID) tablet 25 mcg  25 mcg Oral Daily Maretta Bees, MD   25 mcg at 02/25/22 0604   melatonin tablet 5 mg  5 mg Oral QHS Rolly Salter, MD   5 mg at 02/24/22 2121   menthol-cetylpyridinium (CEPACOL) lozenge 3 mg  1 lozenge Oral PRN Rolly Salter, MD       ondansetron [facility]) tablet 4 mg  4 mg Oral Q6H PRN Rolly Salter, MD       Or   ondansetron [facility]) injection 4 mg  4 mg Intravenous Q6H PRN Rolly Salter, MD   4 mg at 02/13/22 1700   pantoprazole (PROTONIX) EC tablet 40 mg  40 mg Oral BID Pham, Minh Q, RPH-CPP   40 mg at 02/25/22 0918   polyethylene glycol (MIRALAX / GLYCOLAX) packet 17 g  17 g Oral BID Elgergawy, Leana Roe, MD   17 g at 02/18/22 2120   prochlorperazine (COMPAZINE) injection 10 mg  10 mg Intravenous Q6H PRN Rolly Salter, MD   10 mg at 02/13/22 2033   rosuvastatin (CRESTOR) tablet 20 mg  20 mg Oral Daily Rolly Salter, MD   20 mg at 02/25/22 2841     Discharge Medications: Please see discharge summary for a list of discharge medications.  Relevant Imaging Results:  Relevant Lab Results:   Additional Information SSN: [id number] Going to ALF with hospice.  Oletta Lamas, MSW, LCSWA, LCASA Transitions of Care  Clinical Social Worker I

## 2022-02-26 ENCOUNTER — Other Ambulatory Visit (HOSPITAL_COMMUNITY): Payer: Self-pay

## 2022-02-26 DIAGNOSIS — Z7189 Other specified counseling: Secondary | ICD-10-CM | POA: Diagnosis not present

## 2022-02-26 DIAGNOSIS — Z66 Do not resuscitate: Secondary | ICD-10-CM

## 2022-02-26 DIAGNOSIS — Z515 Encounter for palliative care: Secondary | ICD-10-CM | POA: Diagnosis not present

## 2022-02-26 DIAGNOSIS — I482 Chronic atrial fibrillation, unspecified: Secondary | ICD-10-CM | POA: Diagnosis not present

## 2022-02-26 DIAGNOSIS — I4891 Unspecified atrial fibrillation: Secondary | ICD-10-CM | POA: Diagnosis not present

## 2022-02-26 DIAGNOSIS — I2510 Atherosclerotic heart disease of native coronary artery without angina pectoris: Secondary | ICD-10-CM | POA: Diagnosis not present

## 2022-02-26 MED ORDER — DILTIAZEM HCL ER COATED BEADS 120 MG PO CP24
120.0000 mg | ORAL_CAPSULE | Freq: Every day | ORAL | 1 refills | Status: DC
  Filled 2022-02-26: qty 30, 30d supply, fill #0

## 2022-02-26 MED ORDER — DILTIAZEM HCL ER COATED BEADS 120 MG PO CP24: 120.0000 mg | ORAL_CAPSULE | Freq: Every day | ORAL | 1 refills | Status: DC

## 2022-02-26 MED ORDER — ACETAMINOPHEN 500 MG PO TABS: 1000.0000 mg | ORAL_TABLET | Freq: Three times a day (TID) | ORAL | 0 refills | Status: DC | PRN

## 2022-02-26 MED ORDER — AMIODARONE HCL 200 MG PO TABS
200.0000 mg | ORAL_TABLET | Freq: Two times a day (BID) | ORAL | 0 refills | Status: DC
  Filled 2022-02-26 (×2): qty 32, 16d supply, fill #0

## 2022-02-26 MED ORDER — AMIODARONE HCL 200 MG PO TABS: 200.0000 mg | ORAL_TABLET | Freq: Every day | ORAL | 1 refills | Status: DC

## 2022-02-26 MED ORDER — AMIODARONE HCL 200 MG PO TABS
200.0000 mg | ORAL_TABLET | Freq: Every day | ORAL | 1 refills | Status: DC
  Filled 2022-02-26: qty 30, 30d supply, fill #0

## 2022-02-26 MED ORDER — AMIODARONE HCL 200 MG PO TABS: 200.0000 mg | ORAL_TABLET | Freq: Two times a day (BID) | ORAL | 0 refills | Status: AC

## 2022-02-26 NOTE — TOC Transition Note (Signed)
Transition of Care [facility]) - CM/SW Discharge Note   Patient Details  Name: Charlotte Powell MRN: [id number] Date of Birth: 10/05/1928  Transition of Care [facility]) CM/SW Contact:  Bjorn Pippin, LCSW Phone Number: 02/26/2022, 11:25 AM   Clinical Narrative:    Patient will DC to: [facility] Anticipated DC date: 02/26/2022 Family notified: [name] (daughter) Transport by: Corey Harold   Per MD patient ready for DC to [facility]. RN, patient, patient's family, and facility notified of DC. Discharge Summary and FL2 sent to facility. RN to call report prior to discharge [phone]. DC packet on chart. Ambulance transport requested for patient.   CSW will sign off for now as social work intervention is no longer needed. Please consult [place] again if new needs arise.    Final next level of care: Assisted Living Barriers to Discharge: No Barriers Identified   Patient Goals and CMS Choice      Discharge Placement                Patient chooses bed at:  ([facility]) Patient to be transferred to facility by: [place] Name of family member notified: [name] Patient and family notified of of transfer: 02/26/22  Discharge Plan and Services Additional resources added to the After Visit Summary for   In-house Referral: NA              DME Arranged: N/A ([place] [facility]) DME Agency: NA Date DME Agency Contacted: 02/25/22   Representative spoke with at DME Agency: [place] ([place]) Interventions [place]: No Food Insecurity (02/12/2022)  Housing: Low Risk  (02/12/2022)  Transportation Needs: No Transportation Needs (02/12/2022)  Utilities: Not At Risk (02/12/2022)  Tobacco Use: Low Risk  (02/12/2022)     Readmission Risk Interventions    02/18/2022    3:48 PM  Readmission Risk Prevention Plan  Transportation Screening Complete  Palliative Care Screening Not Applicable  Medication Review (RN Care  Manager) Complete    Beckey Rutter, MSW, LCSWA, LCASA Transitions of Care  Clinical Social Worker I

## 2022-02-26 NOTE — Progress Notes (Signed)
Daily Progress Note   Patient Name: Charlotte Powell       Date: 02/26/2022 DOB: [birth year]  Age: 89 y.o. MRN#: [id number] Attending Physician: Jonetta Osgood, MD Primary Care Physician: Sandrea Hughs, NP Admit Date: 02/12/2022  Reason for Consultation/Follow-up: Establishing goals of care  Patient Profile/HPI:   89 y.o. female  with past medical history of CAD, HTN, HLD, pulm and A-fib on [facility] admitted on 02/12/2022 with weakness and malaise. Found to have afib RVR. Developed significant hypotension and bradycardia with CCB and BB. Now on CCB and amiodarone. Avoiding pacemaker. Also with ongoing failure to thrive. PMT consulted to discuss [place].     Subjective: Chart reviewed including labs, progress notes, imaging from this and previous encounters.  [name] has no complaints today. Looking forward to being discharged. She is comfortable. Helped adjust her in bed.  Spoke with daughter privately - hospice has been arranged and she has been educated about their support. Questions and concerns addressed. She understands uncertainty of prognosis.   Vital Signs: BP 111/74 (BP Location: Right Arm)   Pulse 64   Temp 98.2 F (36.8 C) (Oral)   Resp (!) 21   Ht 5\' 3"  (1.6 m)   Wt 63 kg   SpO2 95%   BMI 24.60 kg/m  SpO2: SpO2: 95 % O2 Device: O2 Device: Room Air O2 Flow Rate: O2 Flow Rate (L/min): 2 L/min  Intake/output summary:  Intake/Output Summary (Last 24 hours) at 02/26/2022 1224 Last data filed at 02/26/2022 0414 Gross per 24 hour  Intake 240 ml  Output 975 ml  Net -735 ml    LBM: Last BM Date : 02/25/22 Baseline Weight: Weight: 62.1 kg Most recent weight: Weight: 63 kg       Palliative Assessment/Data: PPS: 30 %      Patient Active Problem List   Diagnosis Date  Noted   Chronic atrial fibrillation with RVR (HCC) 02/12/2022   Rotavirus infection 01/17/2022   Nausea, vomiting, and diarrhea 01/16/2022   Compression fracture of spine ([place]) 01/16/2022   Thrombocytopenia ([place]) 01/16/2022   Hypothyroidism 01/16/2022   Nausea vomiting and diarrhea 01/16/2022   Acute exacerbation of chronic low back pain 11/30/2021   Chronic left shoulder pain 11/30/2021   Obtundation 11/28/2021   Medication adverse effect 11/27/2021   PAF (paroxysmal atrial  fibrillation) ([place]) 04/22/2021   Chest pain of uncertain etiology 35/70/1779   CAD in native artery 04/22/2021   Chronic anticoagulation 04/22/2021   Atrial fibrillation with RVR ([place]) 04/14/2021   Left upper arm pain 04/14/2021   Secondary hypercoagulable state ([place]) 03/21/2021   Viral gastroenteritis 02/09/2021   DNR (do not resuscitate)/DNI(Do Not Intubate) 02/09/2021   HLD (hyperlipidemia) 02/09/2021   CAD (coronary artery disease) 02/09/2021   Persistent atrial fibrillation ([place]) 11/16/2020   HTN (hypertension) 11/16/2020    Palliative Care Assessment & Plan    Assessment/Recommendations/Plan  DC to ALF with hospice   Code Status: DNR  Prognosis:  < 6 months  Discharge Planning: Home with Hospice  Care plan was discussed with daughter  Thank you for allowing the Palliative Medicine Team to assist in the care of this patient.   Juel Burrow, DNP, AGNP-C Palliative Medicine Team Team Phone # [phone]  Pager # [phone]   Please contact Palliative Medicine Team phone at [phone] for questions and concerns.

## 2022-02-26 NOTE — Discharge Summary (Signed)
PATIENT DETAILS Name: Charlotte Powell Age: 89 y.o. Sex: female Date of Birth: [birth year] MRN: [id number]. Admitting Physician: Lavina Hamman, MD NIO:[id number], Nelda Bucks, NP  Admit Date: 02/12/2022 Discharge date: 02/26/2022  Recommendations for Outpatient Follow-up:  Follow up with PCP in 1-2 weeks Please obtain CMP/CBC in one week Please ensure follow-up with cardiology Being discharged with home hospice service.  Admitted From:  ALF   Disposition: ALF with hospice care   Discharge Condition: fair  CODE STATUS:   Code Status: DNR   Diet recommendation:  Diet Order             Diet - low sodium heart healthy           Diet regular Room service appropriate? Yes; Fluid consistency: Thin  Diet effective now                    Brief Summary: Patient is a 89 y.o.  female with chronic atrial fibrillation, HTN, HLD, hypothyroidism-shortness of breath-patient was found to have A-fib RVR.   Significant events: 12/27>> admit to [facility]-A-fib RVR 01/03>> brief episode of hypotension-beta-blocker/Cardizem discontinued 01/04>> BP stabilized-remains in RVR-Cardizem started.   Significant studies: 12/28>> CTA chest: No PE, small bilateral pleural effusions. 01/03>> CT head: No acute intracranial abnormality.   Significant microbiology data: 12/27>> COVID/influenza/RSV PCR: Negative 12/27>> blood culture: No growth   Procedures: None   Consults: Cardiology Palliative care  Brief Hospital Course: Chronic Afib with RVR Suspected tachybradycardia syndrome Rate much better-avoid aggressive rate control strategy.  As patient has had episode of hypotension/bradycardia with aggressive rate control.  Suspicion for underlying tachybradycardia syndrome-patient/family not interested in pursuing PPM placement. Beta-blocker was slowly optimized-she was started on amiodarone (200 mg twice daily x 21 days, followed by 200 mg daily)-with significant improvement in rate control.   Continue to allow some amount of permissive tachycardia.  Continue Eliquis. Ensure outpatient follow-up with cardiology.   Hypotension Occurred on 1/3-following administration of beta-blocker/Cardizem No evidence of sepsis/volume loss BP has now stabilized-responded to supportive care including gentle IV fluid bolus.     Failure to thrive syndrome Continues to have generalized weakness/-although appetite issues seems to be slowly improving. Unfortunately-do not see any reversible causes at this point Prior MD tried Remeron but apparently caused sedation Liberalize diet-encourage use of nutritional supplements.   Abdominal pain Soft belly-benign exam Likely from cough X-ray abdomen negative Supportive care   Peripheral neuropathy Continue Neurontin   Hypothyroidism Continue Synthroid    HLD Continue statin   Hypokalemia Repleted   Hypomagnesemia Continue supplementation   Hyponatremia Mild-of no clinical significance   Palliative care DNR in place Palliative care following-plan is now to discharge back to ALF with hospice. DME being delivered Continues to have failure to thrive syndrome and erratic oral intake.   BMI: Estimated body mass index is 24.95 kg/m as calculated from the following:   Height as of this encounter: 5\' 3"  (1.6 m).   Weight as of this encounter: 63.9 kg.   Discharge Diagnoses:  Principal Problem:   Chronic atrial fibrillation with RVR (HCC) Active Problems:   Atrial fibrillation with RVR (HCC)   CAD in native artery   Discharge Instructions:  Activity:  As tolerated with Full fall precautions use walker/cane & assistance as needed   Discharge Instructions     Amb referral to AFIB Clinic   Complete by: As directed    Call MD for:  difficulty breathing, headache or visual disturbances   Complete by:  As directed    Diet - low sodium heart healthy   Complete by: As directed    Discharge instructions   Complete by: As directed     Follow with Primary MD  Ngetich, Dinah C, NP in 1-2 weeks  Please get a complete blood count and chemistry panel checked by your Primary MD at your next visit, and again as instructed by your Primary MD.  Get Medicines reviewed and adjusted: Please take all your medications with you for your next visit with your Primary MD  Laboratory/radiological data: Please request your Primary MD to go over all hospital tests and procedure/radiological results at the follow up, please ask your Primary MD to get all Hospital records sent to his/her office.  In some cases, they will be blood work, cultures and biopsy results pending at the time of your discharge. Please request that your primary care M.D. follows up on these results.  Also Note the following: If you experience worsening of your admission symptoms, develop shortness of breath, life threatening emergency, suicidal or homicidal thoughts you must seek medical attention immediately by calling 911 or calling your MD immediately  if symptoms less severe.  You must read complete instructions/literature along with all the possible adverse reactions/side effects for all the Medicines you take and that have been prescribed to you. Take any new Medicines after you have completely understood and accpet all the possible adverse reactions/side effects.   Do not drive when taking Pain medications or sleeping medications (Benzodaizepines)  Do not take more than prescribed Pain, Sleep and Anxiety Medications. It is not advisable to combine anxiety,sleep and pain medications without talking with your primary care practitioner  Special Instructions: If you have smoked or chewed Tobacco  in the last 2 yrs please stop smoking, stop any regular Alcohol  and or any Recreational drug use.  Wear Seat belts while driving.  Please note: You were cared for by a hospitalist during your hospital stay. Once you are discharged, your primary care physician will handle  any further medical issues. Please note that NO REFILLS for any discharge medications will be authorized once you are discharged, as it is imperative that you return to your primary care physician (or establish a relationship with a primary care physician if you do not have one) for your post hospital discharge needs so that they can reassess your need for medications and monitor your lab values.   Increase activity slowly   Complete by: As directed    No wound care   Complete by: As directed       Allergies as of 02/26/2022       Reactions   Baclofen Other (See Comments)   hypoactive encephalopathy due to baclofen   Amoxicillin Other (See Comments)   unknown   Hydrocodone Other (See Comments)   Confusion and AMS        Medication List     STOP taking these medications    diltiazem 30 MG tablet Commonly known as: Cardizem   doxycycline 100 MG capsule Commonly known as: VIBRAMYCIN   furosemide 20 MG tablet Commonly known as: LASIX   ondansetron 4 MG tablet Commonly known as: ZOFRAN       TAKE these medications    acetaminophen 500 MG tablet Commonly known as: TYLENOL Take 2 tablets (1,000 mg total) by mouth every 8 (eight) hours as needed. What changed:  when to take this reasons to take this   amiodarone 200 MG tablet Commonly known  as: PACERONE Take 1 tablet (200 mg total) by mouth 2 (two) times daily for 16 days.   amiodarone 200 MG tablet Commonly known as: PACERONE Take 1 tablet (200 mg total) by mouth daily. Start taking on: March 15, 2022   Anti-Diarrheal 2 MG tablet Generic drug: loperamide Take 2 mg by mouth every 6 (six) hours as needed for diarrhea or loose stools.   Biofreeze 4 % Gel Generic drug: Menthol (Topical Analgesic) Apply 1 Application topically in the morning and at bedtime. Apply to left upper quadrant abdominal area liberally.   Cholecalciferol 25 MCG (1000 UT) capsule Take 1,000 Units by mouth daily.   diltiazem 120 MG 24  hr capsule Commonly known as: CARDIZEM CD Take 1 capsule (120 mg total) by mouth daily.   Eliquis 2.5 MG Tabs tablet Generic drug: apixaban Take 1 tablet (2.5 mg total) by mouth 2 (two) times daily.   fluticasone 50 MCG/ACT nasal spray Commonly known as: FLONASE Place 1 spray into both nostrils daily.   folic acid 800 MCG tablet Commonly known as: FOLVITE Take 800 mcg by mouth daily.   gabapentin 300 MG capsule Commonly known as: NEURONTIN Take 300 mg by mouth at bedtime.   guaifenesin 100 MG/5ML syrup Commonly known as: ROBITUSSIN Take 200 mg by mouth every 6 (six) hours as needed for cough or congestion. What changed: Another medication with the same name was removed. Continue taking this medication, and follow the directions you see here.   levothyroxine 25 MCG tablet Commonly known as: SYNTHROID Take 25 mcg by mouth daily.   lidocaine 5 % Commonly known as: LIDODERM Place 1 patch onto the skin every morning. Apply 1 patch to lower back every morning and remove 12 hours later.   melatonin 5 MG Tabs Take 5 mg by mouth at bedtime.   nitroGLYCERIN 0.4 MG SL tablet Commonly known as: NITROSTAT Place 0.4 mg under the tongue every 5 (five) minutes as needed for chest pain.   NON FORMULARY Take 1 Dose by mouth daily. Magic Cup ice cream   omeprazole 20 MG capsule Commonly known as: PRILOSEC Take 20 mg by mouth daily before breakfast.   phenylephrine-shark liver oil-mineral oil-petrolatum 0.25-14-74.9 % rectal ointment Commonly known as: PREPARATION H Place 1 Application rectally 4 (four) times daily as needed for hemorrhoids (rectal pain).   Polyethylene Glycol 3350 Powd Take 17 g by mouth daily as needed (constipation).   Probiotic 250 MG Caps Take 250 mg by mouth in the morning and at bedtime.   rosuvastatin 20 MG tablet Commonly known as: CRESTOR Take 1 tablet (20 mg total) by mouth daily.   Senexon-S 8.6-50 MG tablet Generic drug: senna-docusate Take 1  tablet by mouth 3 (three) times a week. Monday, Wednesday, and Friday at bedtime.   simethicone 125 MG chewable tablet Commonly known as: MYLICON Chew 125 mg by mouth 4 (four) times daily as needed for flatulence.        Follow-up Information     Ngetich, Dinah C, NP. Schedule an appointment as soon as possible for a visit in 1 week(s).   Specialty: Family Medicine Contact information: [address] [place] [address] [phone]         [facility] Follow up.   Specialty: Cardiology Why: Hospital follow up, Office will call with date/time, If you dont hear from them,please give them a call Contact information: [address] [id number] mc [place] [address] [phone]  Allergies  Allergen Reactions   Baclofen Other (See Comments)    hypoactive encephalopathy due to baclofen   Amoxicillin Other (See Comments)    unknown   Hydrocodone Other (See Comments)    Confusion and AMS     Other Procedures/Studies: CT HEAD WO CONTRAST ( )  Result Date: 02/18/2022 CLINICAL DATA:  BLURRY VISION EXAM: CT HEAD WITHOUT CONTRAST TECHNIQUE: Contiguous axial images were obtained from the base of the skull through the vertex without intravenous contrast. RADIATION DOSE REDUCTION: This exam was performed according to the departmental dose-optimization program which includes automated exposure control, adjustment of the mA and/or kV according to patient size and/or use of iterative reconstruction technique. COMPARISON:  CT head 11/27/2021 FINDINGS: Brain: Cerebral ventricle sizes are concordant with the degree of cerebral volume loss. Patchy and confluent areas of decreased attenuation are noted throughout the deep and periventricular white matter of the cerebral hemispheres bilaterally, compatible with chronic microvascular ischemic disease. Chronic right basal ganglia and right cerebellar lacunar infarction. No evidence  of large-territorial acute infarction. No parenchymal hemorrhage. No mass lesion. No extra-axial collection. No mass effect or midline shift. No hydrocephalus. Basilar cisterns are patent. Vascular: No hyperdense vessel. Skull: No acute fracture or focal lesion. Sinuses/Orbits: Paranasal sinuses and mastoid air cells are clear. Bilateral lens replacement. Otherwise the orbits are unremarkable. Other: None. IMPRESSION: No acute intracranial abnormality. Electronically Signed   By: Tish Frederickson M.D.   On: 02/18/2022 21:56   DG CHEST PORT 1 VIEW  Result Date: 02/13/2022 CLINICAL DATA:  Shortness of breath, cough EXAM: PORTABLE CHEST 1 VIEW COMPARISON:  02/12/2022.  CT today. FINDINGS: Heart and mediastinal contours within normal limits. Bilateral lower lobe airspace opacities, left greater than right. Possible small effusions. Slight improvement in aeration in the lung bases since prior study. No acute bony abnormality. IMPRESSION: Bibasilar atelectasis or infiltrates, slightly improved since prior study. Small bilateral effusions. Electronically Signed   By: Charlett Nose M.D.   On: 02/13/2022 19:25   VAS [place] LOWER EXTREMITY VENOUS (DVT)  Result Date: 02/13/2022  Lower Venous DVT Study Patient Name:  CAILY RAKERS  Date of Exam:   02/13/2022 Medical Rec #: [id number]        Accession #:    [id number] Date of Birth: Apr 21, 1928        Patient Gender: F Patient Age:   41 years Exam Location:  [facility] Procedure:      VAS [place] LOWER EXTREMITY VENOUS (DVT) Referring Phys: PRANAV PATEL --------------------------------------------------------------------------------  Indications: Edema.  Comparison Study: No prior studies. Performing Technologist: Jean Rosenthal RDMS, RVT  Examination Guidelines: A complete evaluation includes B-mode imaging, spectral Doppler, color Doppler, and power Doppler as needed of all accessible portions of each vessel. Bilateral testing is considered an integral part of a complete  examination. Limited examinations for reoccurring indications may be performed as noted. The reflux portion of the exam is performed with the patient in reverse Trendelenburg.  +---------+---------------+---------+-----------+----------+--------------+ RIGHT    CompressibilityPhasicitySpontaneityPropertiesThrombus Aging +---------+---------------+---------+-----------+----------+--------------+ CFV      Full           Yes      Yes                                 +---------+---------------+---------+-----------+----------+--------------+ SFJ      Full                                                        +---------+---------------+---------+-----------+----------+--------------+  FV Prox  Full                                                        +---------+---------------+---------+-----------+----------+--------------+ FV Mid   Full                                                        +---------+---------------+---------+-----------+----------+--------------+ FV DistalFull                                                        +---------+---------------+---------+-----------+----------+--------------+ PFV      Full                                                        +---------+---------------+---------+-----------+----------+--------------+ POP      Full           Yes      Yes                                 +---------+---------------+---------+-----------+----------+--------------+ PTV      Full                                                        +---------+---------------+---------+-----------+----------+--------------+ PERO     Full                                                        +---------+---------------+---------+-----------+----------+--------------+   +---------+---------------+---------+-----------+----------+--------------+ LEFT     CompressibilityPhasicitySpontaneityPropertiesThrombus Aging  +---------+---------------+---------+-----------+----------+--------------+ CFV      Full           Yes      Yes                                 +---------+---------------+---------+-----------+----------+--------------+ SFJ      Full                                                        +---------+---------------+---------+-----------+----------+--------------+ FV Prox  Full                                                        +---------+---------------+---------+-----------+----------+--------------+  FV Mid   Full                                                        +---------+---------------+---------+-----------+----------+--------------+ FV DistalFull                                                        +---------+---------------+---------+-----------+----------+--------------+ PFV      Full                                                        +---------+---------------+---------+-----------+----------+--------------+ POP      Full           Yes      Yes                                 +---------+---------------+---------+-----------+----------+--------------+ PTV      Full                                                        +---------+---------------+---------+-----------+----------+--------------+ PERO     Full                                                        +---------+---------------+---------+-----------+----------+--------------+     Summary: RIGHT: - There is no evidence of deep vein thrombosis in the lower extremity.  - No cystic structure found in the popliteal fossa.  LEFT: - There is no evidence of deep vein thrombosis in the lower extremity.  - No cystic structure found in the popliteal fossa.  *See table(s) above for measurements and observations. Electronically signed by Coral Else MD on 02/13/2022 at 5:23:32 PM.    Final    ECHOCARDIOGRAM LIMITED  Result Date: 02/13/2022    ECHOCARDIOGRAM LIMITED REPORT    Patient Name:   KEERAT DENICOLA Date of Exam: 02/13/2022 Medical Rec #:  [id number]       Height:       63.0 in Accession #:    [id number]      Weight:       140.4 lb Date of Birth:  [birth year]       BSA:          1.664 m Patient Age:    89 years        BP:           100/63 mmHg Patient Gender: F               HR:           139 bpm. Exam Location:  Inpatient Procedure: Limited Echo, Cardiac Doppler and Limited  Color Doppler Indications:    Atrial Fibrillation I48.91  History:        Patient has prior history of Echocardiogram examinations, most                 recent 01/17/2022. CAD, Arrythmias:Atrial Fibrillation,                 Signs/Symptoms:Chest Pain; Risk Factors:Hypertension,                 Dyslipidemia and Non-Smoker.  Sonographer:    Aron Baba Referring Phys: [phone] PHILIP J NAHSER  Sonographer Comments: Image acquisition challenging due to respiratory motion. IMPRESSIONS  1. Left ventricular ejection fraction, by estimation, is 60 to 65%. The left ventricle has normal function. The left ventricle has no regional wall motion abnormalities. Left ventricular diastolic parameters are indeterminate.  2. Right ventricular systolic function is normal. The right ventricular size is mildly enlarged. There is normal pulmonary artery systolic pressure.  3. Left atrial size was moderately dilated.  4. The mitral valve is normal in structure. Mild mitral valve regurgitation. No evidence of mitral stenosis.  5. Tricuspid valve regurgitation is moderate.  6. The aortic valve is normal in structure. Aortic valve regurgitation is not visualized. No aortic stenosis is present.  7. The inferior vena cava is normal in size with greater than 50% respiratory variability, suggesting right atrial pressure of 3 mmHg. FINDINGS  Left Ventricle: Left ventricular ejection fraction, by estimation, is 60 to 65%. The left ventricle has normal function. The left ventricle has no regional wall motion abnormalities. The left ventricular  internal cavity size was normal in size. There is  no left ventricular hypertrophy. Left ventricular diastolic parameters are indeterminate. Right Ventricle: The right ventricular size is mildly enlarged. No increase in right ventricular wall thickness. Right ventricular systolic function is normal. There is normal pulmonary artery systolic pressure. The tricuspid regurgitant velocity is 2.19  m/s, and with an assumed right atrial pressure of 15 mmHg, the estimated right ventricular systolic pressure is 34.2 mmHg. Left Atrium: Left atrial size was moderately dilated. Right Atrium: Right atrial size was normal in size. Pericardium: There is no evidence of pericardial effusion. Presence of epicardial fat layer. Mitral Valve: The mitral valve is normal in structure. Mild mitral valve regurgitation. No evidence of mitral valve stenosis. Tricuspid Valve: The tricuspid valve is normal in structure. Tricuspid valve regurgitation is moderate . No evidence of tricuspid stenosis. Aortic Valve: The aortic valve is normal in structure. Aortic valve regurgitation is not visualized. No aortic stenosis is present. Pulmonic Valve: The pulmonic valve was normal in structure. Pulmonic valve regurgitation is not visualized. No evidence of pulmonic stenosis. Aorta: The aortic root is normal in size and structure. Venous: The inferior vena cava is normal in size with greater than 50% respiratory variability, suggesting right atrial pressure of 3 mmHg. IAS/Shunts: No atrial level shunt detected by color flow Doppler. Additional Comments: Spectral Doppler performed. Color Doppler performed.  LEFT VENTRICLE PLAX 2D LVIDd:         3.10 cm LVIDs:         1.60 cm LV PW:         1.20 cm LV IVS:        0.90 cm  LEFT ATRIUM         Index LA diam:    5.00 cm 3.01 cm/m  MITRAL VALVE               TRICUSPID VALVE MV  Area (PHT): 5.93 cm    TR Peak grad:   19.2 mmHg MV Decel Time: 128 msec    TR Vmax:        219.00 cm/s MV E velocity: 93.00 cm/s MV  A velocity: 42.00 cm/s MV E/A ratio:  2.21 Kardie Tobb DO Electronically signed by Thomasene Ripple DO Signature Date/Time: 02/13/2022/1:44:47 PM    Final    CT Angio Chest Pulmonary Embolism (PE) W or WO Contrast  Result Date: 02/13/2022 CLINICAL DATA:  89 year old female with suspected pulmonary embolism. Shortness of breath. EXAM: CT ANGIOGRAPHY CHEST WITH CONTRAST TECHNIQUE: Multidetector CT imaging of the chest was performed using the standard protocol during bolus administration of intravenous contrast. Multiplanar CT image reconstructions and MIPs were obtained to evaluate the vascular anatomy. RADIATION DOSE REDUCTION: This exam was performed according to the departmental dose-optimization program which includes automated exposure control, adjustment of the mA and/or kV according to patient size and/or use of iterative reconstruction technique. CONTRAST:  27mL OMNIPAQUE IOHEXOL 350 MG/ML SOLN COMPARISON:  Chest CT 02/09/2021. FINDINGS: Cardiovascular: No filling defects within the pulmonary arterial tree to suggest pulmonary embolism. Heart size is mildly enlarged with biatrial dilatation. There is no significant pericardial fluid, thickening or pericardial calcification. There is aortic atherosclerosis, as well as atherosclerosis of the great vessels of the mediastinum and the coronary arteries, including calcified atherosclerotic plaque in the left main, left anterior descending, left circumflex and right coronary arteries. Mediastinum/Nodes: No pathologically enlarged mediastinal or hilar lymph nodes. Esophagus is unremarkable in appearance. No axillary lymphadenopathy. Lungs/Pleura: Small bilateral pleural effusions lying dependently with areas of dependent passive subsegmental atelectasis in the lower lobes of the lungs bilaterally. No acute consolidative airspace disease. No definite suspicious appearing pulmonary nodules or masses are noted. Upper Abdomen: Aortic atherosclerosis. Musculoskeletal:  Chronic appearing compression fractures are noted at T5, T7, T9 and T11, most severe at T7 where there is 60% loss of anterior vertebral body height. Multiple old healed fractures of the left anterolateral third, fourth and fifth ribs. There are no aggressive appearing lytic or blastic lesions noted in the visualized portions of the skeleton. Review of the MIP images confirms the above findings. IMPRESSION: 1. No evidence of pulmonary embolism. 2. Small bilateral pleural effusions lying dependently with areas of passive subsegmental atelectasis in the lower lobes of the lungs bilaterally. 3. Cardiomegaly with biatrial dilatation. 4. Aortic atherosclerosis, in addition to left main and three-vessel coronary artery disease. Aortic Atherosclerosis (ICD10-I70.0). Electronically Signed   By: Trudie Reed M.D.   On: 02/13/2022 07:05   DG Abd 1 View  Result Date: 02/12/2022 CLINICAL DATA:  Abdominal discomfort for 3-4 days EXAM: ABDOMEN - 1 VIEW COMPARISON:  07/08/2021, 01/15/2022 FINDINGS: Two supine frontal views of the abdomen and pelvis are obtained, excluding portions of the hemidiaphragms by collimation. The bowel gas pattern is unremarkable without obstruction or ileus. No significant fecal retention. No abdominal masses. 8 mm calcification overlying the right mid abdomen corresponds to chest wall calcification within the costal cartilage on prior CT. No urinary tract calculi. Postsurgical changes left hip. Stable multilevel spondylosis. IMPRESSION: 1. Unremarkable bowel gas pattern. Electronically Signed   By: Sharlet Salina M.D.   On: 02/12/2022 19:35   DG Chest Port 1 View  Result Date: 02/12/2022 CLINICAL DATA:  Chest pain. EXAM: PORTABLE CHEST 1 VIEW COMPARISON:  January 15, 2022. FINDINGS: Stable cardiomegaly. Right lung is clear. Mild left basilar atelectasis or infiltrate is noted with possible effusion. Severe degenerative changes seen involving the  left glenohumeral joint. IMPRESSION: Left  basilar opacity as described above. Electronically Signed   By: Lupita Raider M.D.   On: 02/12/2022 15:10     TODAY-DAY OF DISCHARGE:  Subjective:   Peyton Najjar today has no headache,no chest abdominal pain,no new weakness tingling or numbness, feels much better wants to go home today.   Objective:   Blood pressure 111/74, pulse 64, temperature 98.2 F (36.8 C), temperature source Oral, resp. rate (!) 21, height 5\' 3"  (1.6 m), weight 63 kg, SpO2 95 %.  Intake/Output Summary (Last 24 hours) at 02/26/2022 0930 Last data filed at 02/26/2022 0414 Gross per 24 hour  Intake 240 ml  Output 975 ml  Net -735 ml   Filed Weights   02/24/22 0549 02/25/22 0423 02/26/22 0026  Weight: 67.2 kg 63.9 kg 63 kg    Exam: Awake Alert, Oriented *3, No new F.N deficits, Normal affect [place].AT,PERRAL Supple Neck,No JVD, No cervical lymphadenopathy appriciated.  Symmetrical Chest wall movement, Good air movement bilaterally, CTAB RRR,No Gallops,Rubs or new Murmurs, No Parasternal Heave +ve B.Sounds, Abd Soft, Non tender, No organomegaly appriciated, No rebound -guarding or rigidity. No Cyanosis, Clubbing or edema, No new Rash or bruise   PERTINENT RADIOLOGIC STUDIES: No results found.   PERTINENT LAB RESULTS: CBC: No results for input(s): "WBC", "HGB", "HCT", "PLT" in the last 72 hours. CMET CMP     Component Value Date/Time   NA 133 (L) 02/22/2022 0108   K 3.8 02/22/2022 0108   CL 105 02/22/2022 0108   CO2 22 02/22/2022 0108   GLUCOSE 118 (H) 02/22/2022 0108   BUN 8 02/22/2022 0108   CREATININE 0.74 02/22/2022 0108   CALCIUM 7.9 (L) 02/22/2022 0108   PROT 5.5 (L) 02/13/2022 0222   ALBUMIN 2.5 (L) 02/13/2022 0222   AST 28 02/13/2022 0222   ALT 15 02/13/2022 0222   ALKPHOS 104 02/13/2022 0222   BILITOT 2.4 (H) 02/13/2022 0222   GFRNONAA >60 02/22/2022 0108    GFR Estimated Creatinine Clearance: 40.9 mL/min (by C-G formula based on SCr of 0.74 mg/dL). No results for input(s):  "LIPASE", "AMYLASE" in the last 72 hours. No results for input(s): "CKTOTAL", "CKMB", "CKMBINDEX", "TROPONINI" in the last 72 hours. Invalid input(s): "POCBNP" No results for input(s): "DDIMER" in the last 72 hours. No results for input(s): "HGBA1C" in the last 72 hours. No results for input(s): "CHOL", "HDL", "LDLCALC", "TRIG", "CHOLHDL", "LDLDIRECT" in the last 72 hours. No results for input(s): "TSH", "T4TOTAL", "T3FREE", "THYROIDAB" in the last 72 hours.  Invalid input(s): "FREET3" No results for input(s): "VITAMINB12", "FOLATE", "FERRITIN", "TIBC", "IRON", "RETICCTPCT" in the last 72 hours. Coags: No results for input(s): "INR" in the last 72 hours.  Invalid input(s): "PT" Microbiology: No results found for this or any previous visit (from the past 240 hour(s)).  FURTHER DISCHARGE INSTRUCTIONS:  Get Medicines reviewed and adjusted: Please take all your medications with you for your next visit with your Primary MD  Laboratory/radiological data: Please request your Primary MD to go over all hospital tests and procedure/radiological results at the follow up, please ask your Primary MD to get all Hospital records sent to his/her office.  In some cases, they will be blood work, cultures and biopsy results pending at the time of your discharge. Please request that your primary care M.D. goes through all the records of your hospital data and follows up on these results.  Also Note the following: If you experience worsening of your admission symptoms, develop shortness of  breath, life threatening emergency, suicidal or homicidal thoughts you must seek medical attention immediately by calling 911 or calling your MD immediately  if symptoms less severe.  You must read complete instructions/literature along with all the possible adverse reactions/side effects for all the Medicines you take and that have been prescribed to you. Take any new Medicines after you have completely understood and  accpet all the possible adverse reactions/side effects.   Do not drive when taking Pain medications or sleeping medications (Benzodaizepines)  Do not take more than prescribed Pain, Sleep and Anxiety Medications. It is not advisable to combine anxiety,sleep and pain medications without talking with your primary care practitioner  Special Instructions: If you have smoked or chewed Tobacco  in the last 2 yrs please stop smoking, stop any regular Alcohol  and or any Recreational drug use.  Wear Seat belts while driving.  Please note: You were cared for by a hospitalist during your hospital stay. Once you are discharged, your primary care physician will handle any further medical issues. Please note that NO REFILLS for any discharge medications will be authorized once you are discharged, as it is imperative that you return to your primary care physician (or establish a relationship with a primary care physician if you do not have one) for your post hospital discharge needs so that they can reassess your need for medications and monitor your lab values.  Total Time spent coordinating discharge including counseling, education and face to face time equals greater than 30 minutes.  Signed: Shloime Keilman 02/26/2022 9:30 AM

## 2022-02-26 NOTE — Care Management Important Message (Signed)
Important Message  Patient Details  Name: Charlotte Powell MRN: [id number] Date of Birth: 04/07/1928   Medicare Important Message Given:  Yes     Shelda Altes 02/26/2022, 10:51 AM

## 2022-02-26 NOTE — NC FL2 (Signed)
[place] LEVEL OF CARE FORM     IDENTIFICATION  Patient Name: Charlotte Powell Birthdate: [birth year] Sex: female Admission Date (Current Location): 02/12/2022  [facility] and [place] Number:  Herbalist and Address:  [facility], [place] [address], [place], [place] [address]      Provider Number: [id number]  Attending Physician Name and Address:  Jonetta Osgood, MD  Relative Name and Phone Number:       Current Level of Care: Hospital Recommended Level of Care: [place] Prior Approval Number:    Date Approved/Denied:   PASRR Number: [id number]  Discharge Plan: Other (Comment) (ALF)    Current Diagnoses: Patient Active Problem List   Diagnosis Date Noted   Chronic atrial fibrillation with RVR ([place]) 02/12/2022   Rotavirus infection 01/17/2022   Nausea, vomiting, and diarrhea 01/16/2022   Compression fracture of spine ([place]) 01/16/2022   Thrombocytopenia ([place]) 01/16/2022   Hypothyroidism 01/16/2022   Nausea vomiting and diarrhea 01/16/2022   Acute exacerbation of chronic low back pain 11/30/2021   Chronic left shoulder pain 11/30/2021   Obtundation 11/28/2021   Medication adverse effect 11/27/2021   PAF (paroxysmal atrial fibrillation) ([place]) 04/22/2021   Chest pain of uncertain etiology 46/27/0350   CAD in native artery 04/22/2021   Chronic anticoagulation 04/22/2021   Atrial fibrillation with RVR ([place]) 04/14/2021   Left upper arm pain 04/14/2021   Secondary hypercoagulable state ([place]) 03/21/2021   Viral gastroenteritis 02/09/2021   DNR (do not resuscitate)/DNI(Do Not Intubate) 02/09/2021   HLD (hyperlipidemia) 02/09/2021   CAD (coronary artery disease) 02/09/2021   Persistent atrial fibrillation ([place]) 11/16/2020   HTN (hypertension) 11/16/2020    Orientation RESPIRATION BLADDER Height & Weight     Self, Time, Situation, Place  Normal External catheter, Incontinent Weight: 138 lb 14.2 oz (63  kg) Height:  5\' 3"  (160 cm)  BEHAVIORAL SYMPTOMS/MOOD NEUROLOGICAL BOWEL NUTRITION STATUS      Continent Diet (See dc summary)  AMBULATORY STATUS COMMUNICATION OF NEEDS Skin   Extensive Assist Verbally Normal                       Personal Care Assistance Level of Assistance  Bathing, Feeding, Dressing Bathing Assistance: Maximum assistance Feeding assistance: Limited assistance Dressing Assistance: Maximum assistance     Functional Limitations Info  Sight, Hearing, Speech Sight Info: Impaired Hearing Info: Impaired Speech Info: Adequate    SPECIAL CARE FACTORS FREQUENCY                       Contractures Contractures Info: Not present    Additional Factors Info  Code Status, Allergies Code Status Info: DNR Allergies Info: Baclofe, Amoxicillin and Hydrocodone           Current Medications (02/26/2022):  This is the current hospital active medication list Current Facility-Administered Medications  Medication Dose Route Frequency Provider Last Rate Last Admin   acetaminophen (TYLENOL) tablet 650 mg  650 mg Oral Q6H PRN Lavina Hamman, MD   650 mg at 02/26/22 0938   Or   acetaminophen (TYLENOL) suppository 650 mg  650 mg Rectal Q6H PRN Lavina Hamman, MD       amiodarone (PACERONE) tablet 200 mg  200 mg Oral BID Jonetta Osgood, MD   200 mg at 02/26/22 1018   Followed by   Derrill Memo ON 03/15/2022] amiodarone (PACERONE) tablet 200 mg  200 mg Oral Daily Ghimire, Shanker  M, MD       apixaban Arne [place]) tablet 5 mg  5 mg Oral BID Lelon Perla, MD   5 mg at 02/26/22 1018   benzonatate (TESSALON) capsule 200 mg  200 mg Oral TID Lavina Hamman, MD   200 mg at 02/26/22 1018   dextromethorphan (DELSYM) 30 MG/5ML liquid 30 mg  30 mg Oral BID Lavina Hamman, MD   30 mg at 02/24/22 2120   diltiazem (CARDIZEM CD) 24 hr capsule 120 mg  120 mg Oral Daily Lelon Perla, MD   120 mg at 02/26/22 1018   docusate sodium (COLACE) capsule 100 mg  100 mg Oral BID Lavina Hamman, MD   100 mg at 02/24/22 2121   feeding supplement (BOOST / RESOURCE BREEZE) liquid 1 Container  1 Container Oral TID BM Elgergawy, Silver Huguenin, MD   1 Container at 02/26/22 1020   fluticasone (FLONASE) 50 MCG/ACT nasal spray 1 spray  1 spray Each Nare Daily Lavina Hamman, MD   1 spray at 02/25/22 0921   gabapentin (NEURONTIN) capsule 300 mg  300 mg Oral QHS Lavina Hamman, MD   300 mg at 02/25/22 2113   guaiFENesin (MUCINEX) 12 hr tablet 600 mg  600 mg Oral BID Lavina Hamman, MD   600 mg at 02/26/22 1018   levalbuterol (XOPENEX) nebulizer solution 0.63 mg  0.63 mg Nebulization Q6H PRN Lavina Hamman, MD   0.63 mg at 02/14/22 9024   levothyroxine (SYNTHROID) tablet 25 mcg  25 mcg Oral Daily Jonetta Osgood, MD   25 mcg at 02/26/22 0538   melatonin tablet 5 mg  5 mg Oral QHS Lavina Hamman, MD   5 mg at 02/25/22 2113   menthol-cetylpyridinium (CEPACOL) lozenge 3 mg  1 lozenge Oral PRN Lavina Hamman, MD       ondansetron [facility]) tablet 4 mg  4 mg Oral Q6H PRN Lavina Hamman, MD       Or   ondansetron [facility]) injection 4 mg  4 mg Intravenous Q6H PRN Lavina Hamman, MD   4 mg at 02/13/22 1700   pantoprazole (PROTONIX) EC tablet 40 mg  40 mg Oral BID Pham, Minh Q, RPH-CPP   40 mg at 02/26/22 1018   polyethylene glycol (MIRALAX / GLYCOLAX) packet 17 g  17 g Oral BID Elgergawy, Silver Huguenin, MD   17 g at 02/18/22 2120   prochlorperazine (COMPAZINE) injection 10 mg  10 mg Intravenous Q6H PRN Lavina Hamman, MD   10 mg at 02/13/22 2033   rosuvastatin (CRESTOR) tablet 20 mg  20 mg Oral Daily Lavina Hamman, MD   20 mg at 02/26/22 1018     Discharge Medications: Please see discharge summary for a list of discharge medications.  Relevant Imaging Results:  Relevant Lab Results:   Additional Information SSN: [id number]  Beckey Rutter, MSW, [place] Transitions of Care  Clinical Social Worker I

## 2022-02-28 ENCOUNTER — Encounter: Payer: Medicare HMO | Admitting: Family

## 2022-03-12 ENCOUNTER — Other Ambulatory Visit (HOSPITAL_COMMUNITY): Payer: Self-pay

## 2022-05-19 DEATH — deceased

## 2022-07-01 ENCOUNTER — Ambulatory Visit: Payer: Medicare HMO | Admitting: Family

## 2022-10-18 IMAGING — CT CT ABD-PELV W/ CM
2 of 4 series · 17 of 46 positions shown, 19 images · IV contrast (omnipaque)
Comparison: None.

CLINICAL DATA: Nausea vomiting and diarrhea

EXAM:
CT ABDOMEN AND PELVIS WITH CONTRAST
TECHNIQUE: Multidetector CT imaging of the abdomen and pelvis was performed
using the standard protocol following bolus administration of
intravenous contrast.
CONTRAST:  80mL OMNIPAQUE IOHEXOL 350 MG/ML SOLN

[Series 2: axial st · axial · 0.78mm/px · z∈[-401,-36]mm · 14 of 81 slices shown, 16 images]
[im 4/81  soft-tissue]
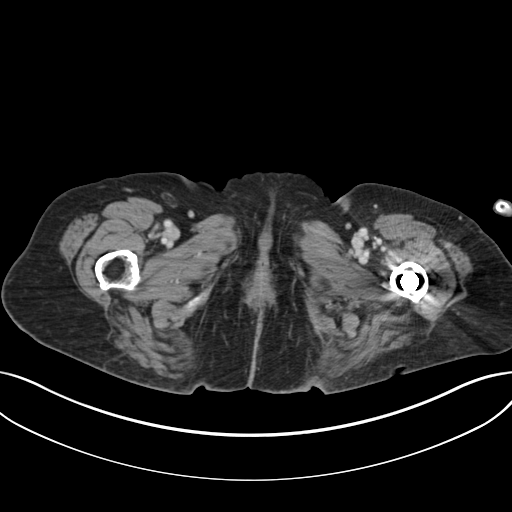
[im 4/81  bone]
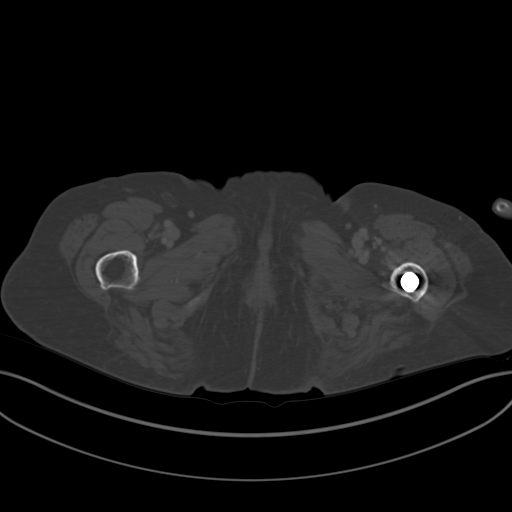
[im 11/81  soft-tissue]
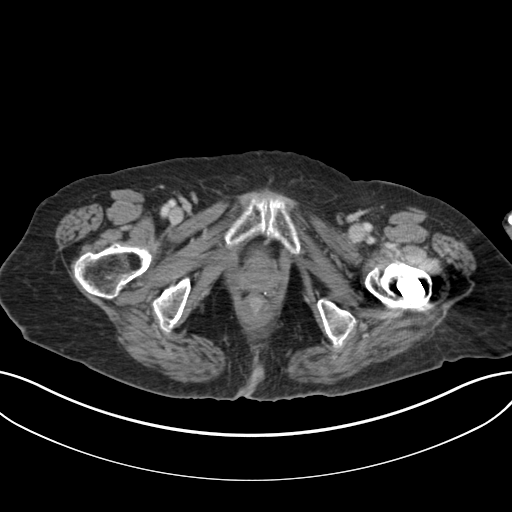
[im 15/81  soft-tissue]
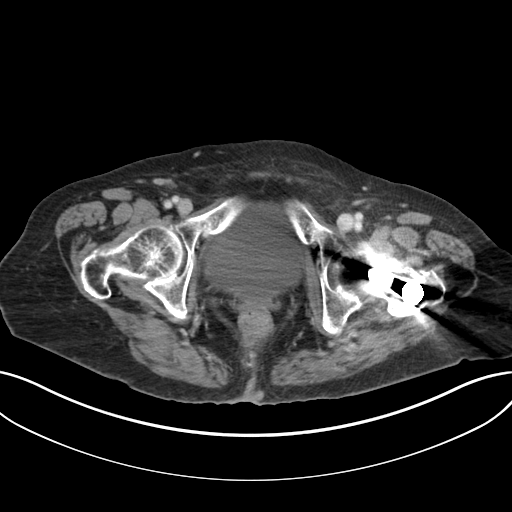
[im 22/81  soft-tissue]
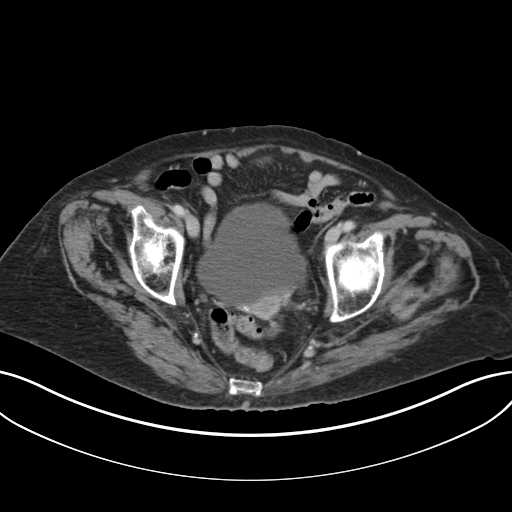
[im 26/81  soft-tissue]
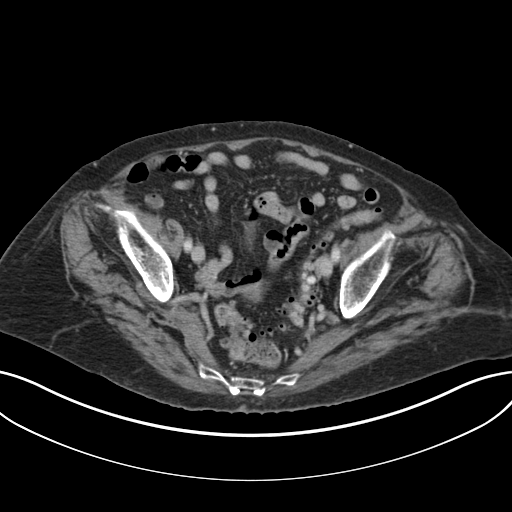
[im 33/81  soft-tissue]
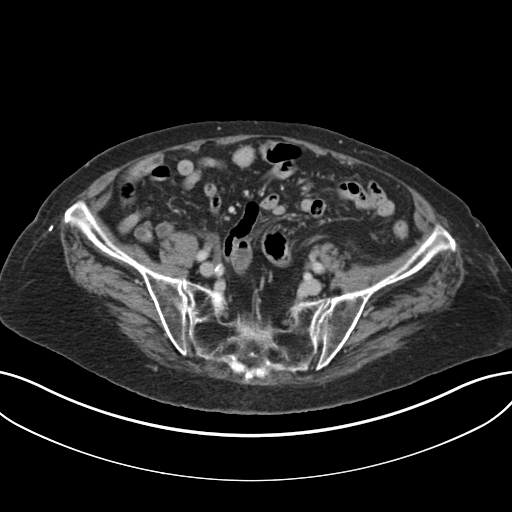
[im 37/81  soft-tissue]
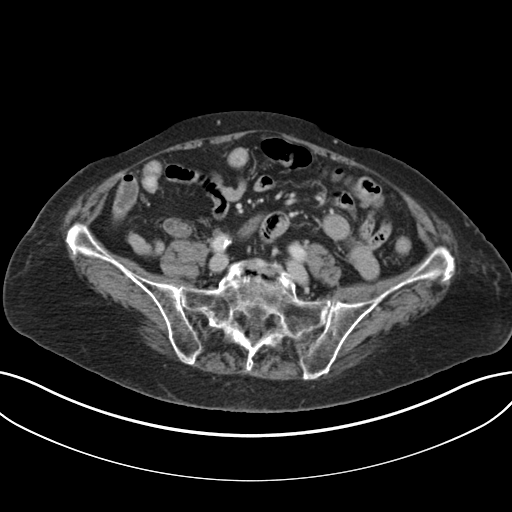
[im 44/81  soft-tissue]
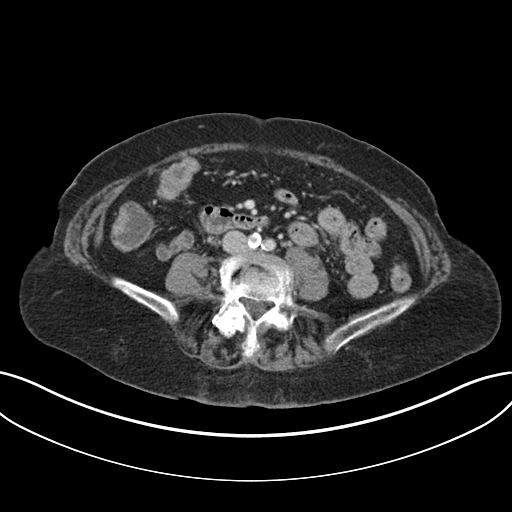
[im 48/81  soft-tissue]
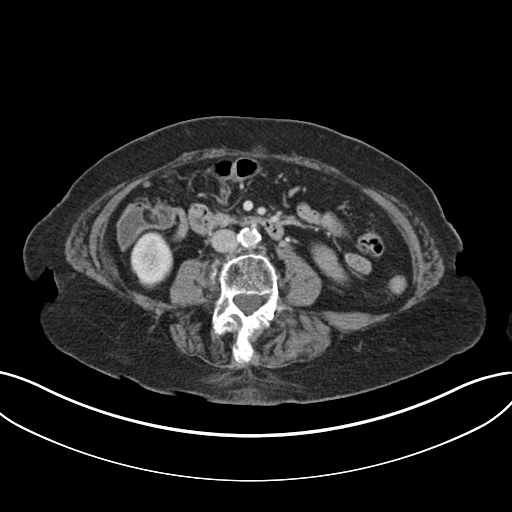
[im 48/81  bone]
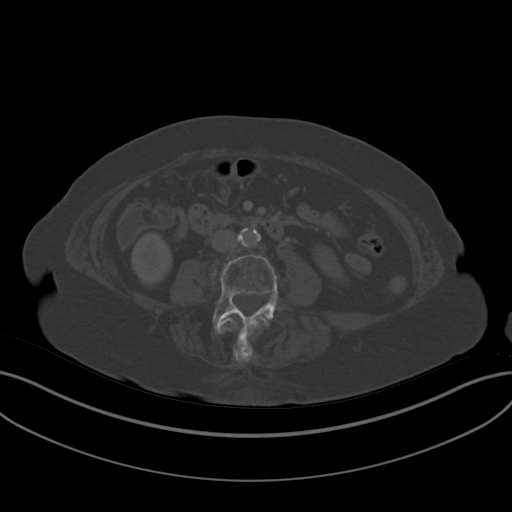
[im 55/81  soft-tissue]
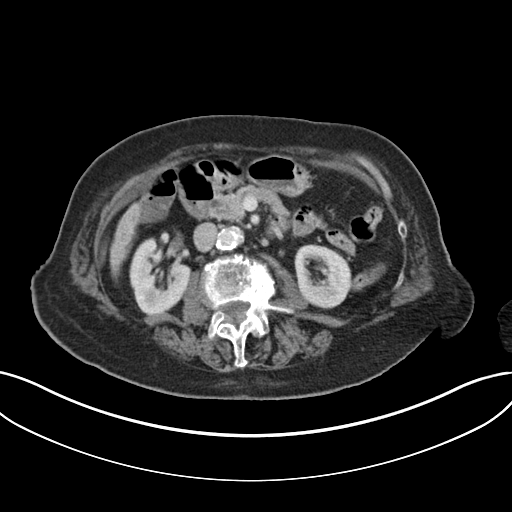
[im 59/81  soft-tissue]
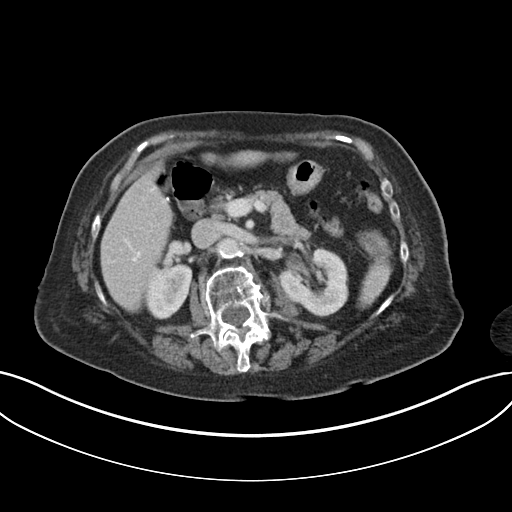
[im 66/81  soft-tissue]
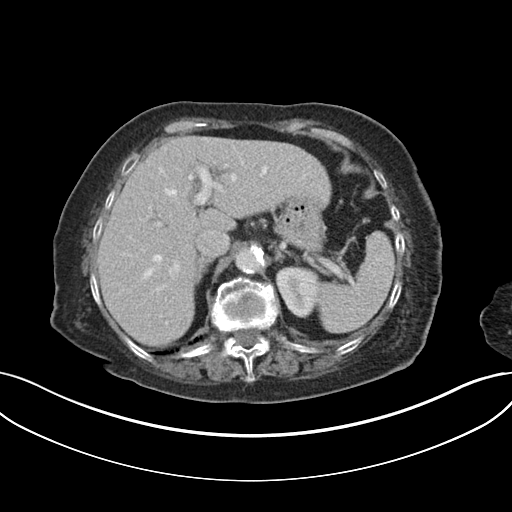
[im 70/81  soft-tissue]
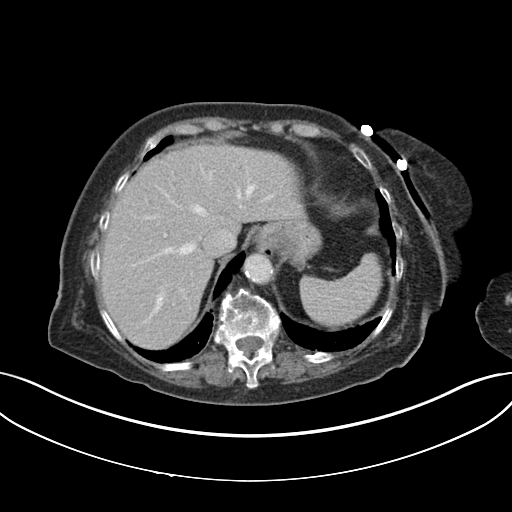
[im 77/81  soft-tissue]
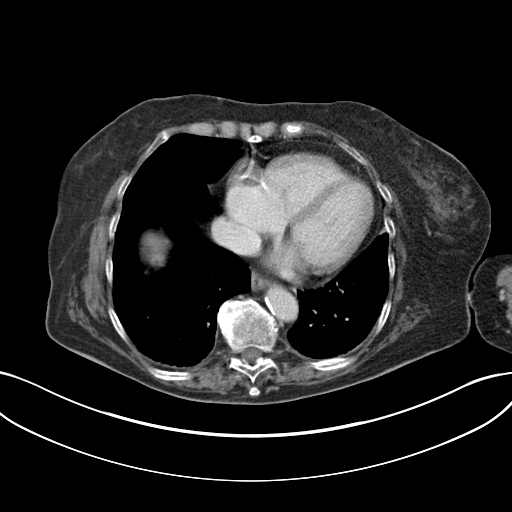

[Series 5: coronal st · coronal · 0.81mm/px · 3 of 150 slices shown]
[im 50/150  soft-tissue]
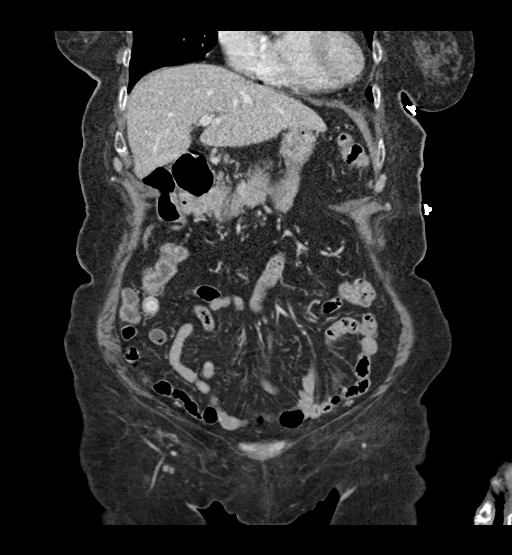
[im 67/150  soft-tissue]
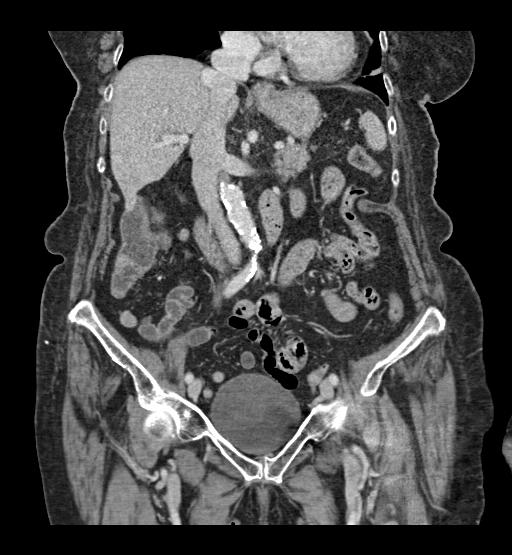
[im 83/150  soft-tissue]
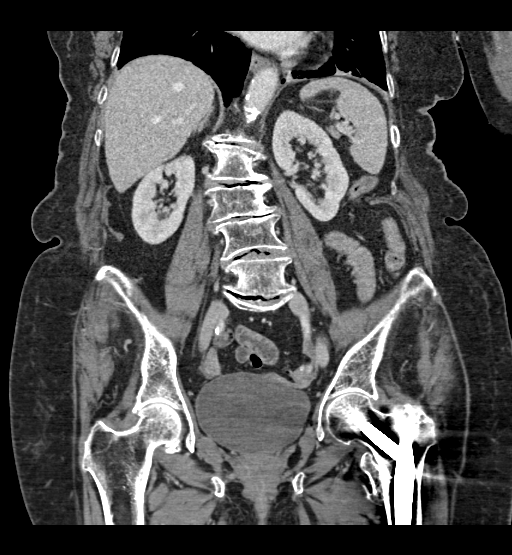

[17 of 46 positions shown; findings below may reference images not displayed]

FINDINGS: LOWER CHEST: Normal.

HEPATOBILIARY: Normal hepatic contours. No intra- or extrahepatic
biliary dilatation. Status post cholecystectomy.

PANCREAS: Normal pancreas. No ductal dilatation or peripancreatic
fluid collection.

SPLEEN: Normal.

ADRENALS/URINARY TRACT: The adrenal glands are normal. No
hydronephrosis, nephroureterolithiasis or solid renal mass. The
urinary bladder is normal for degree of distention

STOMACH/BOWEL: There is no hiatal hernia. Normal duodenal course and
caliber. No small bowel dilatation or inflammation. No focal colonic
abnormality. Normal appendix.

VASCULAR/LYMPHATIC: There is calcific atherosclerosis of the
abdominal aorta. No lymphadenopathy.

REPRODUCTIVE: Normal uterus. No adnexal mass.

MUSCULOSKELETAL. Multilevel degenerative disc disease and facet
arthrosis. No bony spinal canal stenosis.

OTHER: None.
IMPRESSION: 1. No acute abnormality of the abdomen or pelvis.

Aortic Atherosclerosis ([id number]).
